# Patient Record
Sex: Female | Born: 1940 | Race: White | Hispanic: No | Marital: Married | Smoking: Former smoker
Health system: Southern US, Community
[De-identification: ages and names within clinical notes are randomized; demographics above are authoritative.]

## PROBLEM LIST (undated history)

## (undated) DIAGNOSIS — C4491 Basal cell carcinoma of skin, unspecified: Secondary | ICD-10-CM

## (undated) DIAGNOSIS — E538 Deficiency of other specified B group vitamins: Secondary | ICD-10-CM

## (undated) DIAGNOSIS — M1990 Unspecified osteoarthritis, unspecified site: Secondary | ICD-10-CM

## (undated) DIAGNOSIS — H269 Unspecified cataract: Secondary | ICD-10-CM

## (undated) DIAGNOSIS — M810 Age-related osteoporosis without current pathological fracture: Secondary | ICD-10-CM

## (undated) DIAGNOSIS — R252 Cramp and spasm: Secondary | ICD-10-CM

## (undated) DIAGNOSIS — E669 Obesity, unspecified: Secondary | ICD-10-CM

## (undated) DIAGNOSIS — M545 Low back pain, unspecified: Secondary | ICD-10-CM

## (undated) DIAGNOSIS — D649 Anemia, unspecified: Secondary | ICD-10-CM

## (undated) DIAGNOSIS — Z79899 Other long term (current) drug therapy: Secondary | ICD-10-CM

## (undated) DIAGNOSIS — R600 Localized edema: Secondary | ICD-10-CM

## (undated) DIAGNOSIS — I10 Essential (primary) hypertension: Secondary | ICD-10-CM

## (undated) DIAGNOSIS — G2581 Restless legs syndrome: Secondary | ICD-10-CM

## (undated) HISTORY — DX: Restless legs syndrome: G25.81

## (undated) HISTORY — DX: Basal cell carcinoma of skin, unspecified: C44.91

## (undated) HISTORY — DX: Low back pain, unspecified: M54.50

## (undated) HISTORY — PX: TONSILLECTOMY: SUR1361

## (undated) HISTORY — DX: Other long term (current) drug therapy: Z79.899

## (undated) HISTORY — DX: Low back pain: M54.5

## (undated) HISTORY — DX: Unspecified osteoarthritis, unspecified site: M19.90

## (undated) HISTORY — DX: Cramp and spasm: R25.2

## (undated) HISTORY — DX: Localized edema: R60.0

## (undated) HISTORY — DX: Obesity, unspecified: E66.9

## (undated) HISTORY — DX: Essential (primary) hypertension: I10

## (undated) HISTORY — PX: UPPER GASTROINTESTINAL ENDOSCOPY: SHX188

## (undated) HISTORY — PX: VITRECTOMY: SHX106

## (undated) HISTORY — DX: Anemia, unspecified: D64.9

## (undated) HISTORY — DX: Deficiency of other specified B group vitamins: E53.8

## (undated) HISTORY — DX: Unspecified cataract: H26.9

## (undated) HISTORY — PX: COLONOSCOPY: SHX174

## (undated) HISTORY — DX: Age-related osteoporosis without current pathological fracture: M81.0

---

## 1979-03-29 HISTORY — PX: STOMACH SURGERY: SHX791

## 2000-07-27 ENCOUNTER — Other Ambulatory Visit: Admission: RE | Admit: 2000-07-27 | Discharge: 2000-07-27 | Payer: Self-pay | Admitting: *Deleted

## 2001-09-12 ENCOUNTER — Other Ambulatory Visit: Admission: RE | Admit: 2001-09-12 | Discharge: 2001-09-12 | Payer: Self-pay | Admitting: *Deleted

## 2003-03-29 HISTORY — PX: SPINE SURGERY: SHX786

## 2003-05-01 ENCOUNTER — Encounter: Admission: RE | Admit: 2003-05-01 | Discharge: 2003-05-01 | Payer: Self-pay | Admitting: Family Medicine

## 2003-05-31 ENCOUNTER — Encounter: Admission: RE | Admit: 2003-05-31 | Discharge: 2003-05-31 | Payer: Self-pay | Admitting: Family Medicine

## 2003-08-12 ENCOUNTER — Encounter: Admission: RE | Admit: 2003-08-12 | Discharge: 2003-08-12 | Payer: Self-pay | Admitting: Family Medicine

## 2003-09-03 ENCOUNTER — Encounter: Admission: RE | Admit: 2003-09-03 | Discharge: 2003-09-03 | Payer: Self-pay | Admitting: Neurosurgery

## 2003-09-08 ENCOUNTER — Ambulatory Visit (HOSPITAL_COMMUNITY): Admission: RE | Admit: 2003-09-08 | Discharge: 2003-09-08 | Payer: Self-pay | Admitting: Gastroenterology

## 2003-10-01 ENCOUNTER — Encounter: Admission: RE | Admit: 2003-10-01 | Discharge: 2003-10-01 | Payer: Self-pay | Admitting: Neurosurgery

## 2004-02-11 ENCOUNTER — Inpatient Hospital Stay (HOSPITAL_COMMUNITY): Admission: RE | Admit: 2004-02-11 | Discharge: 2004-02-15 | Payer: Self-pay | Admitting: Neurosurgery

## 2004-04-07 ENCOUNTER — Encounter: Admission: RE | Admit: 2004-04-07 | Discharge: 2004-04-07 | Payer: Self-pay | Admitting: Neurosurgery

## 2004-04-16 ENCOUNTER — Encounter: Admission: RE | Admit: 2004-04-16 | Discharge: 2004-04-16 | Payer: Self-pay | Admitting: Neurosurgery

## 2004-06-09 ENCOUNTER — Encounter: Admission: RE | Admit: 2004-06-09 | Discharge: 2004-06-09 | Payer: Self-pay | Admitting: Family Medicine

## 2004-09-16 ENCOUNTER — Ambulatory Visit (HOSPITAL_COMMUNITY): Admission: RE | Admit: 2004-09-16 | Discharge: 2004-09-16 | Payer: Self-pay | Admitting: Family Medicine

## 2005-05-13 IMAGING — RF DG LUMBAR SPINE 2-3V
1 series · 4 of 4 positions shown · non-contrast
Comparison: none

CLINICAL DATA: Herniated disc

Intraoperative spine:
2 spot images from intraoperative C-arm fluoroscopy document placement of
bilateral pedicle screws at L3 and L4. Markers for graft project in interspace.

[Series 1: run · 4 of 4 slices shown]
[im 1/4]
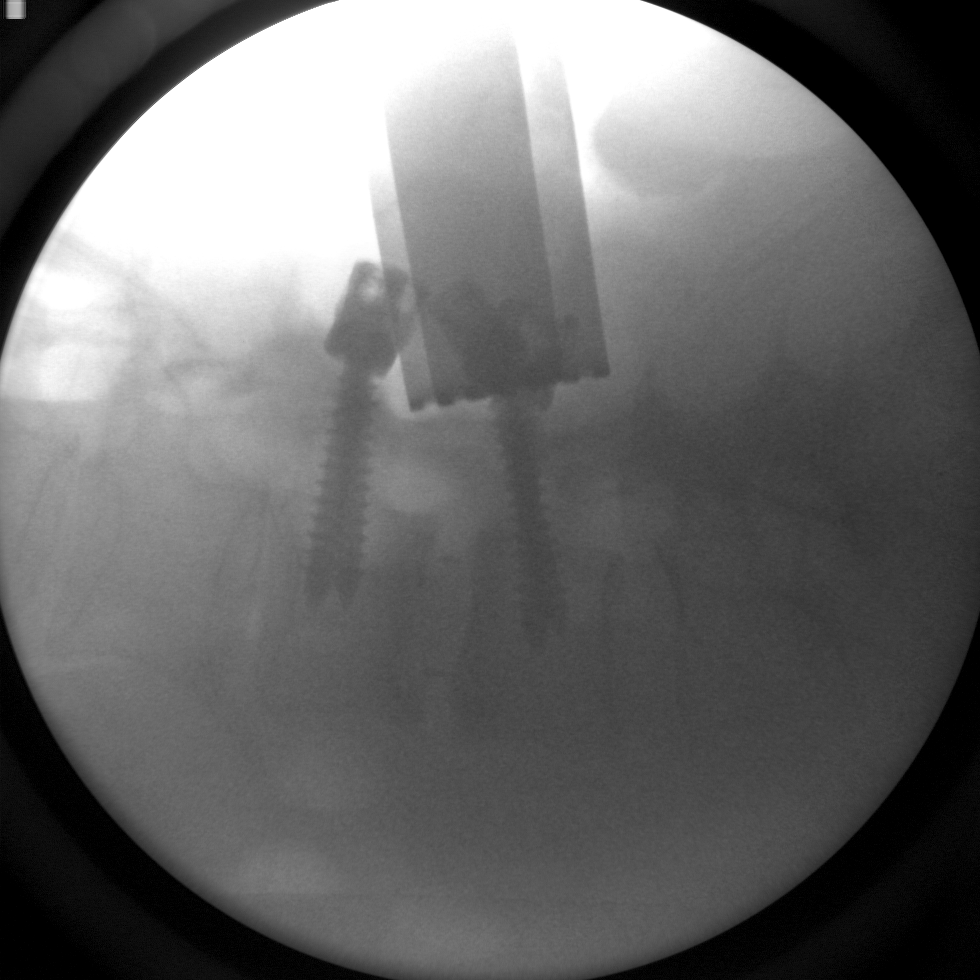
[im 2/4]
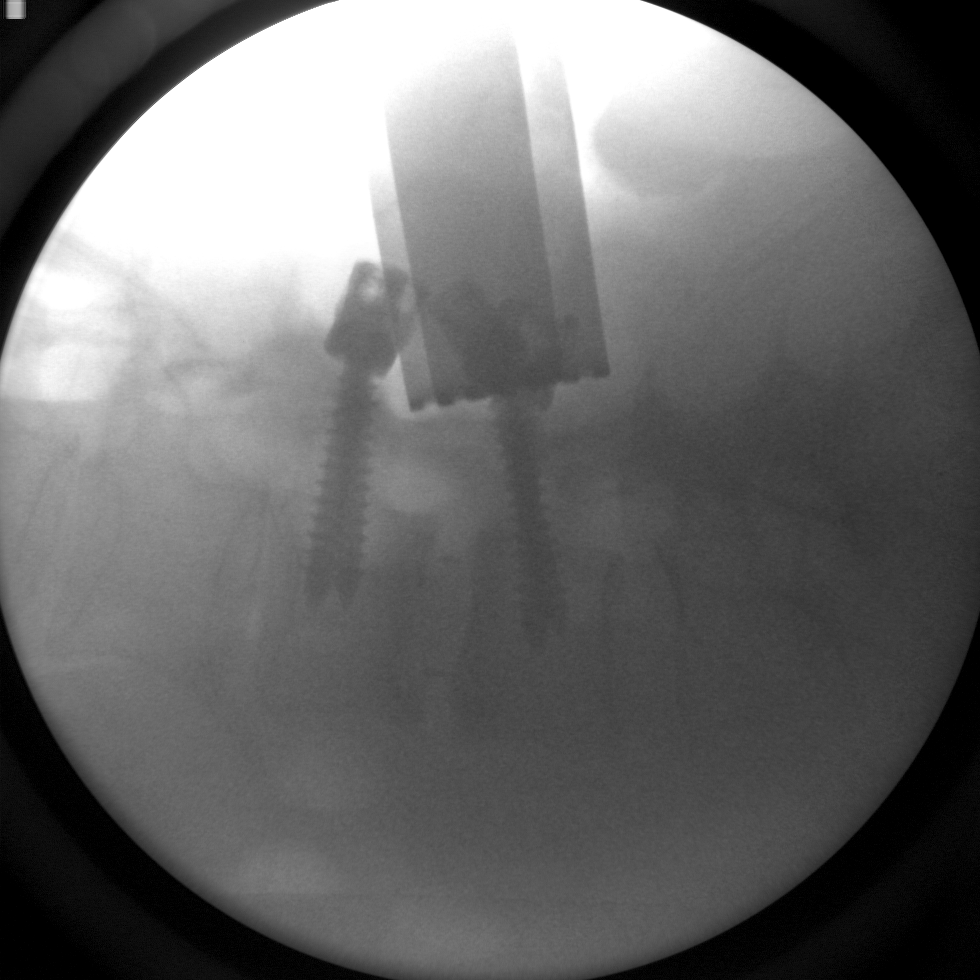
[im 3/4]
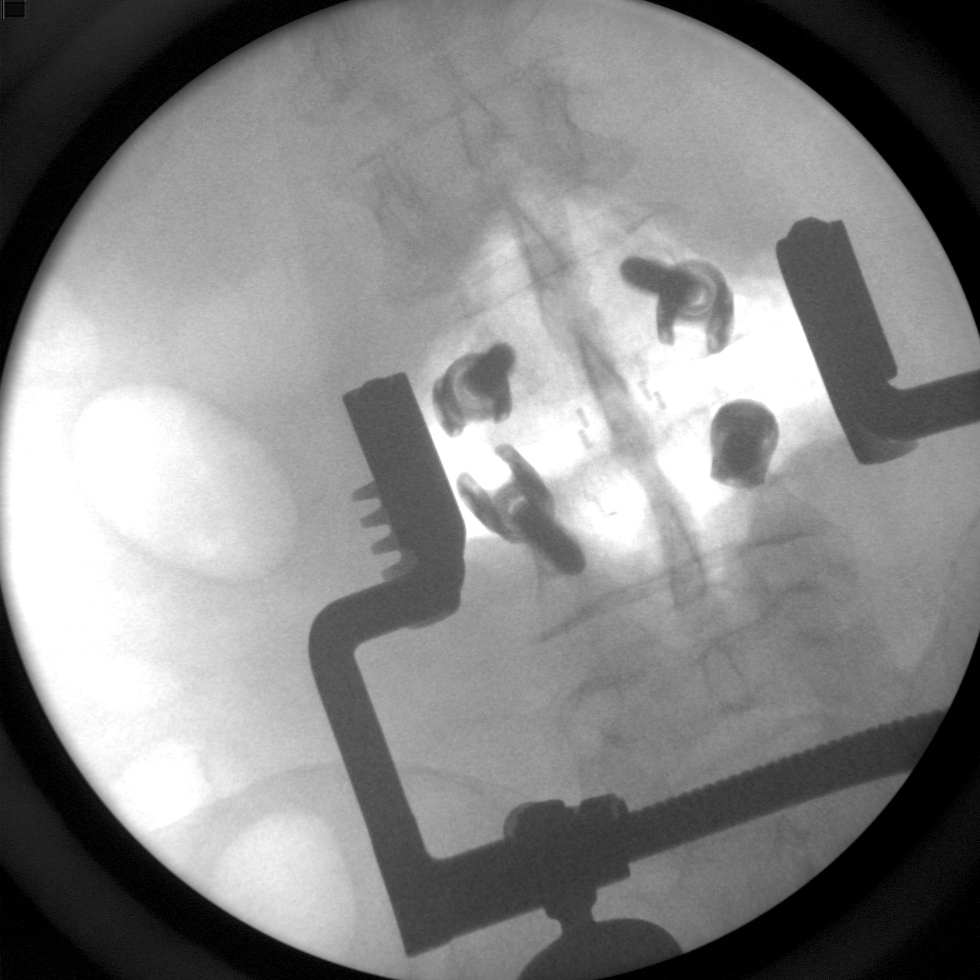
[im 4/4]
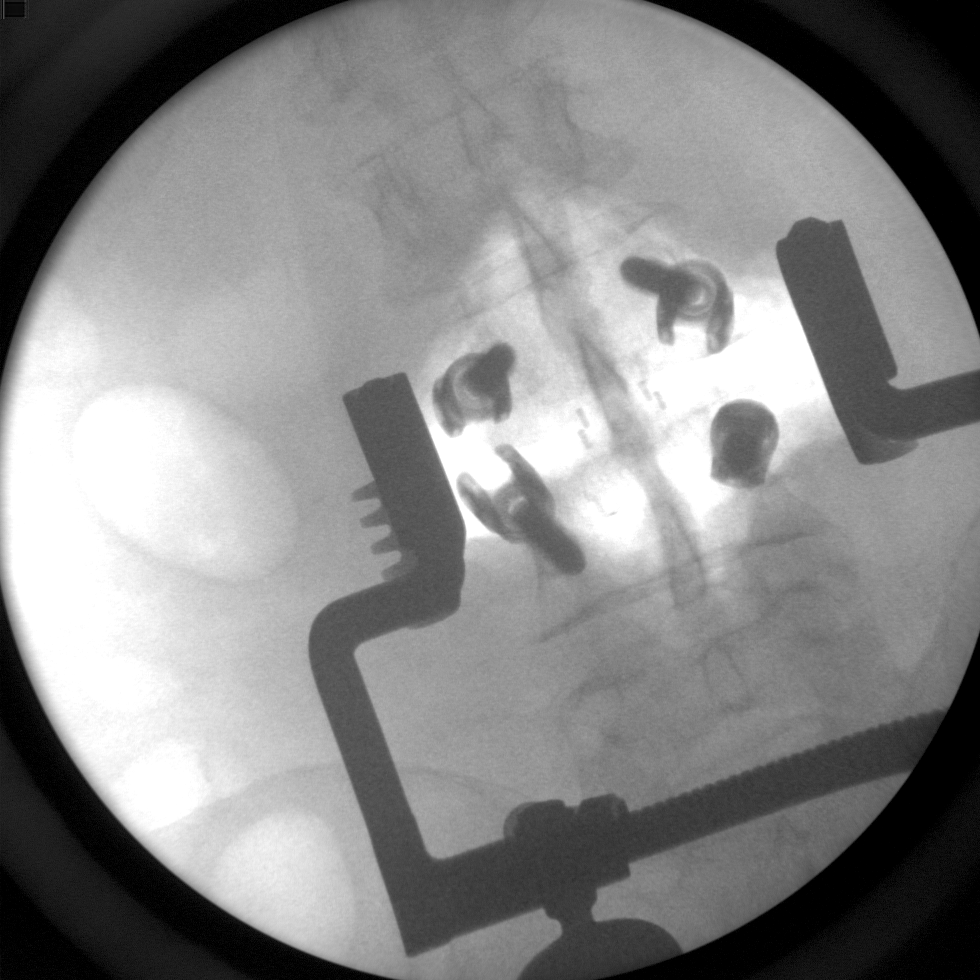

[4 of 4 positions shown; findings below may reference images not displayed]

IMPRESSION: 1. Posterolateral and interbody fusion L3-L4

## 2007-03-16 ENCOUNTER — Inpatient Hospital Stay (HOSPITAL_COMMUNITY): Admission: EM | Admit: 2007-03-16 | Discharge: 2007-03-20 | Payer: Self-pay | Admitting: Emergency Medicine

## 2010-08-10 NOTE — Discharge Summary (Signed)
Jody [name], Jody [name]                 ACCOUNT NO.:  [id number]   MEDICAL RECORD NO.:  [id number]          PATIENT TYPE:  INP   LOCATION:  3025                         FACILITY:  [facility]   PHYSICIAN:  Gabrielle Dare. Janee Morn, M.D.DATE OF BIRTH:  04/16/1940   DATE OF ADMISSION:  03/15/2007  DATE OF DISCHARGE:  03/20/2007                               DISCHARGE SUMMARY   DISCHARGE DIAGNOSES:  1. Fall.  2. Traumatic brain injury with subarachnoid hemorrhage.  3. Cervical strain.  4. Osteoarthritis.  5. Osteoporosis.  6. Acute blood loss anemia   CONSULTANTS:  Dr. Venetia Maxon for neurosurgery.   PROCEDURE:  None.   HISTORY OF PRESENT ILLNESS:  This is a 70 year old white female who fell  from her attic stairs, striking the back of her head.  There is  questionable loss of consciousness.  She came in as a non-trauma code.  Workup in the emergency department showed a subarachnoid hemorrhage  around the basilar cisterns with some mild hydrocephalus.  She  complained of headache and neck pain and was admitted for observation  and neurosurgical consultation.   HOSPITAL COURSE:  Hospital course was uneventful.  Initially, she had  quite a bit of nausea and dizziness but this improved with time and the  addition of right plan and meclizine.  She was placed in a soft collar  for cervical strain which seemed to help and was eventually able to pass  physical and occupational therapy evaluations.  A repeat CT scan showed  some improvement in the hydrocephalus but a slight increase in  subarachnoid hemorrhage.  However, she was doing so well clinically that  it was deemed appropriate that she could go home in the care of her  husband.   DISCHARGE MEDICATIONS:  1. Reglan 5 mg tablets take one p.o. four times a day, #56, no refill.  2. Meclizine 12.5 mg tablets take one p.o. four times a day p.r.n.      dizziness, #50 with no refill.  3. Darvocet-N 100 to take 1-2 p.o. every 4 hours p.r.n. pain, #50 with    no refill.   FOLLOW UP:  The patient is to follow up with her primary care Jody [name]  sometime in the next 2-3 weeks.  If she has any questions or concerns  she may call the trauma service.  Otherwise followup will be on an as-  needed basis.      Earney Hamburg, P.A.      Gabrielle Dare Janee Morn, M.D.  Electronically Signed    MJ/MEDQ  D:  03/20/2007  T:  03/20/2007  Job:  [id number]   cc:   Danae Orleans. Venetia Maxon, M.D.

## 2010-08-10 NOTE — Consult Note (Signed)
NAMEKEISHAWN, DARSEY                 ACCOUNT NO.:  [id number]   MEDICAL RECORD NO.:  [id number]          PATIENT TYPE:  EMS   LOCATION:  MAJO                         FACILITY:  [facility]   PHYSICIAN:  Danae Orleans. Venetia Maxon, M.D.  DATE OF BIRTH:  [birth year]   DATE OF CONSULTATION:  03/16/2007  DATE OF DISCHARGE:                                 CONSULTATION   REASON FOR CONSULTATION:  Subarachnoid hemorrhage.   HISTORY OF ILLNESS:  Victorine Mcnee is a 70 year old woman, who was working  up in her attic tonight and grabbed a hold of a handle which came off.  She fell backwards and struck the back of her head, had a questionable  loss of consciousness.  She was brought to [facility] Emergency  Room.  Head CT was obtained, which demonstrates a fair amount of  subarachnoid blood in the basilar cisterns with effacement of the  basilar cisterns and mild hydrocephalus.  She has significant  osteoarthritis in her neck.  She has a history of lumbar fusion by Dr.  Franky Macho, no nausea, vomiting or seizure.   PAST MEDICAL HISTORY:  Significant for osteoarthritis and osteoporosis.   MEDICATIONS INCLUDE:  Celebrex, Os-Cal, Darvocet, Forteo.   ALLERGIES:  She has no known drug allergies.   SOCIAL HISTORY:  She is retired, lives at home with her husband.   EXAMINATION:  Pulse of 81, respiratory rate is 16, blood pressure  143/80.  She has 95% saturation on room air.  She is awake, alert, oriented times three, complains of pain at the base  of her skull and at the cervicothoracic junction.  She is wearing a  cervical collar.  Pupils were equal, round and reactive to light.  Extraocular movements  are intact.  Tympanic membranes negative.  She complains of mild numbness in her right upper extremity, but has  full strength in all motor groups, bilaterally symmetric in the upper  and lower extremities.  She has a healed lumbar incision.  Reflexes are symmetric in the upper and lower extremities.   IMPRESSION:  Peyson Delao is a 70 year old woman, who fell and struck her  head and has a subarachnoid hemorrhage with mild hydrocephalus.   RECOMMENDATIONS:  Recommend that she be admitted to the neuro ICU,  observe closely with hourly neuro-checks.  We will need to repeat head  CT in the morning.  She will need to get her cervical spine cleared with  flexion/extension views.      Danae Orleans. Venetia Maxon, M.D.  Electronically Signed    JDS/MEDQ  D:  03/16/2007  T:  03/16/2007  Job:  [id number]

## 2010-08-10 NOTE — H&P (Signed)
Jody [name], Jody [name]                 ACCOUNT NO.:  [id number]   MEDICAL RECORD NO.:  [id number]          PATIENT TYPE:  EMS   LOCATION:  MAJO                         FACILITY:  [facility]   PHYSICIAN:  Gabrielle Dare. Janee Morn, M.D.DATE OF BIRTH:  08/24/1940   DATE OF ADMISSION:  03/15/2007  DATE OF DISCHARGE:                              HISTORY & PHYSICAL   CHIEF COMPLAINTS:  Headache and neck pain after fall.   HISTORY OF PRESENT ILLNESS:  Jody [name] is a 70 year old white female who  was up in the attic getting something down when she claims the attic  stair rail broke.  She fell backwards, striking the back of her head on  the attic opening and then falling onto the floor down below.  She  complains of headache and right neck pain as well as some mild right  chest pain on the chest wall.  She was evaluated in the [facility]  emergency department and workup demonstrates subarachnoid hemorrhage at  her basilar cistern on head CT with some hydrocephalus.  We are asked to  evaluate for admission.   PAST MEDICAL HISTORY:  Osteoarthritis, osteoporosis.   PAST SURGICAL HISTORY:  Lumbar spine surgery in 2006 by Dr. Coletta Memos.  She also had gastric stapling in 1981.   SOCIAL HISTORY:  She denies drug use.  Does not smoke.  Does not drink  alcohol.  She lives with her husband.  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   ALLERGIES:  No known drug allergies.   MEDICATIONS:  Are Celebrex, Os-Cal, Darvocet p.r.n. and Forteo  injections daily for her osteoporosis.   REVIEW OF SYSTEMS:  NEUROLOGICAL:  She complains of headache.  MUSCULOSKELETAL:  She has right neck pain and difficulty with movement  of her neck.  She also has some right chest wall pain.  CARDIAC:  No  angina.  PULMONARY:  No shortness of breath though her chest pain is  exacerbated some with deep breathing.  GI: Negative.  GU:  Negative.  Remainder of the review of systems is unremarkable.   PHYSICAL EXAMINATION:  VITAL SIGNS:  Pulse 81, respirations 16, blood  pressure 143/80, saturations 95% on room air.  HEENT:  She has tenderness of her scalp posterior to her right ear with  no hematoma.  EYES:  Pupils equal and reactive.  Extraocular muscles are intact.  EARS:  Are clear bilaterally with no hemotympanum.  FACE:  Face is symmetric and atraumatic.  NECK:  Has some tenderness posteriorly to the right of the midline with  no step-offs.  PULMONARY:  Lungs are clear to auscultation.  She does have some mild  tenderness to the right chest wall on palpation.  CARDIOVASCULAR:  Heart is regular.  No murmurs are heard.  Impulse is  palpable in the left chest.  ABDOMEN:  Is soft and nontender.  No organomegaly is noted.  She has  upper midline scar.  Bowel sounds are normal.  Pelvis is stable  anteriorly.  MUSCULOSKELETAL:  Extremities have no bony deformity or significant  tenderness.  BACK:  Has no tenderness and she does have an old low lumbar scar.  NEUROLOGIC:  Glasgow coma score is 15.  Strength is 5/5 in all 4  extremities.  She had some difficulty raising her head on her neck exam  as above.  No other focal deficits are noted.  Gross sensation is  intact.   LABORATORY STUDIES:  Are pending.  EKG is pending.  Chest x-ray and  right rib films are negative for acute fracture or other finding.  CT  scan of the head shows subarachnoid hemorrhage along the right basilar  cistern with some effacement and mild hydrocephalus.  It also involves  the sylvian fissure.  CT scan of the cervical spine shows degenerative  changes with no acute fracture.   IMPRESSION:  A 70 year old white female status post fall.  1. Traumatic brain injury with subarachnoid hemorrhage of the right      basilar cistern and sylvian fissure with some effacement and mild      hydrocephalus.  2. Cervical strain.  3. Osteoarthritis.   PLAN:  Will be to admit her to the neurosurgical ICU.  We will check   laboratory studies and EKG.  Neurosurgery consult was obtained and I saw  her in conjunction with Dr. Maeola Harman.  We will place her in a [place]  J collar and likely due flexion/extension cervical spine films in the  morning as well as a followup CT head.      Gabrielle Dare Janee Morn, M.D.  Electronically Signed     BET/MEDQ  D:  03/16/2007  T:  03/16/2007  Job:  [id number]   cc:   Talmadge Coventry, M.D.  Danae Orleans. Venetia Maxon, M.D.

## 2010-08-13 NOTE — Op Note (Signed)
NAMEMARISA, [name]                 ACCOUNT NO.:  [id number]   MEDICAL RECORD NO.:  [id number]          PATIENT TYPE:  INP   LOCATION:  3035                         FACILITY:  [facility]   PHYSICIAN:  Coletta Memos, M.D.     DATE OF BIRTH:  05-27-1940   DATE OF PROCEDURE:  02/11/2004  DATE OF DISCHARGE:                                 OPERATIVE REPORT   PREOPERATIVE DIAGNOSES:  1.  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  2.  Lumbar spondylosis.  3.  Lumbar displaced disk, left L3-4.   POSTOPERATIVE DIAGNOSIS:  1.  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  2.  Lumbar spondylosis.  3.  Lumbar displaced disk, left L3-4.   OPERATION PERFORMED:  1.  Posterior lumbar interbody fusion, L3-4.  2.  Interbody cage placement, L3-4, Synthes PEAK 9 mm cages packed with      morcellized autograft.  3.  Posterolateral arthrodesis, L3-4 using morcellized autograft and      allograft.  4.  Pedicle screw fixation L3 to L4, nonsegmental with Synthes Legacy 525      system.   SURGEON:  Coletta Memos, M.D.   ANESTHESIA:  General.   COMPLICATIONS:  None.   ASSISTANT:  Hewitt Shorts, M.D.   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  MRI showed a severe collapse of the disk space at L3-4 along with a  displaced disk on the left side at L3-4.  She had conservative treatment  which was unsuccessful.  I therefore recommended and she agreed to undergo  lumbar fusion at L3-4 via posterior approach and with pedicle screws.   DESCRIPTION OF PROCEDURE:  Jody [name] was brought to the operating room,  intubated and placed under a general anesthetic without difficulty.  She was  rolled prone onto body rolls and all pressure points were properly padded.  Her back was prepped and she was draped in a sterile fashion.  I infiltrated  20 mL 0.5%  lidocaine, 1:200,000 strength epinephrine into the lumbar  region  and paraspinous musculature.  I opened the skin with a #10 blade and with  that incision, took it down to the thoracolumbar fascia.  I then exposed the  lamina of what I believe to be L3 and L4.  I took a localizing film and it  showed that I was inferior to the lamina of L3 with a double ended ganglion  knife.  I then exposed the transverse processes of L3 and L4 bilaterally.  I  took another x-ray to make sure I did not confuse the level.  When I  confirmed I was at the correct level, I then proceed with  semihemilaminectomies bilaterally at L3.  I was able to identify the L3  nerve roots bilaterally and also the L4 nerve roots bilaterally.  I was able  to open the disk space and with curettage and using  specialized PLIF tools  in a Synthes set, I was able to place 2 9 mm PEAK cages into  the empty disk  space.  This was done without difficulty or harm to the nerve roots.  I then  turned my attention to placement of the pedicle screws and with Dr.  Earl Gala assistance and fluoroscopic guidance, four screws were placed,  first by placing a pedicle finder, then a tap, then a pedicle probe to  ensure the integrity of the hole and then 6.5 screws were used all around,  two in L3, two in L4.  I decorticated the transverse processes bilaterally.  Morcellized auto and allograft was placed over the transverse processes for  the posterolateral arthrodesis.  Then the screws were placed along with rods  and they were secured to the screw heads using caps.  X-rays were taken and  showed the screws to be in good position.  I then closed the wound in  layered fashion with Dr. Earl Gala help using Vicryl sutures.  Thoracolumbar fascia was reapproximated to subcutaneous tissue.  Dermabond  was used for a sterile dressing.  The patient was awakened and extubated  moving all extremities well.       KC/MEDQ  D:  02/11/2004  T:  02/11/2004  Job:  [id number]

## 2010-08-13 NOTE — Op Note (Signed)
NAME:  Jody [name], Jody [name]                           ACCOUNT NO.:  [id number]   MEDICAL RECORD NO.:  [id number]                   PATIENT TYPE:  AMB   LOCATION:  ENDO                                 FACILITY[facility]  [facility]   PHYSICIAN:  John C. Madilyn Fireman, M.D.                 DATE OF BIRTH:  16-Aug-1940   DATE OF PROCEDURE:  09/08/2003  DATE OF DISCHARGE:                                 OPERATIVE REPORT   PROCEDURE:  Colonoscopy.   INDICATION FOR PROCEDURE:  Colon cancer screening in [name] 70 year old patient  with no previous screening.   PROCEDURE:  The patient was placed in the left lateral decubitus position  and placed on the pulse monitor with continuous low-flow oxygen delivered by  nasal cannula.  She was sedated with 50 mcg IV fentanyl and 5 mg IV Versed.  The Olympus video colonoscope was inserted into the rectum and advanced to  the cecum, confirmed by transillumination at McBurney's point and  visualization of the ileocecal valve and appendiceal orifice.  Prep was  excellent.  The cecum, ascending, transverse, descending, and sigmoid colon  all appeared normal with the exception of numerous sigmoid diverticula.  There were no masses, polyps, or other abnormalities seen, and the rectum  appeared normal, and retroflexed view of the anus revealed no obvious  internal hemorrhoids.  The scope was then withdrawn and the patient returned  to the recovery room in stable condition.  She tolerated the procedure well,  and there were no immediate complications.   IMPRESSION:  Left-sided diverticulosis, otherwise normal study.   PLAN:  Next colon screening by sigmoidoscopy in five years.                                               John C. Madilyn Fireman, M.D.    JCH/MEDQ  D:  09/08/2003  T:  09/08/2003  Job:  [id number]   cc:   Talmadge Coventry, M.D.  [address]  [place]  [place] [address]  Fax: [phone]

## 2010-08-13 NOTE — Discharge Summary (Signed)
Jody [name], Jody [name] NO.:  [id number]   MEDICAL RECORD NO.:  [id number]           PATIENT TYPE:   LOCATION:                                 FACILITY:   PHYSICIAN:  Cristi Loron, M.D.    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  The MR  demonstrates severe collapse of the disk space at L3-4 along with displaced  disk on the left side at L3-4.  She had conservative treatment, which was  unsuccessful; I therefore recommended that she undergo a lumbar fusion at L3-  4 via posterior approach with pedicle screws.   For further details of this admission, please refer to the typed history and  physical.   HOSPITAL COURSE:  Dr. Franky Macho admitted the patient on February 11, 2004, on  the day of admission performed L3-4 fusion.  The surgery went well without  complications (for full details of this operation, please refer to the typed  operative note).   Postoperative course:  The patient's postoperative course was unremarkable.  By postop day #4, i.e., on February 15, 2004, the patient was afebrile,  vital signs stable.  She was eating well and ambulating well, and she was  requesting discharge to home.  She was therefore discharged home.   DISCHARGE INSTRUCTIONS:  The patient was given written discharge  instructions.  The patient is to follow up with Dr. Franky Macho.   DISCHARGE PRESCRIPTIONS:  1.  Lortab 10 mg, #50, one p.o. q.4h. p.r.n. for pain.  2.  Ambien 5 mg, #30, one p.o. q.h.s. p.r.n.   FINAL DIAGNOSES:  L4-5 degenerative disk disease, lumbar spondylosis, lumbar  displaced disk at L3-4.   PROCEDURE:  Posterior lumbar interbody fusion at L3-4; interbody cage  placement, L3-4 (Synthes PEEK 9 mm cages packed with morcellized  autograft  bone); posterolateral arthrodesis at L3-4 using morcellized autograft and  allograft; pedicle screw fixation, L3 to L4, nonsegmental, with Synthes  Legacy 5.25 system rods.      Cristi Loron, M.D.  Electronically Signed     JDJ/MEDQ  D:  04/28/2005  T:  04/29/2005  Job:  [id number]

## 2010-10-18 DIAGNOSIS — C439 Malignant melanoma of skin, unspecified: Secondary | ICD-10-CM

## 2010-10-18 HISTORY — DX: Malignant melanoma of skin, unspecified: C43.9

## 2010-12-31 LAB — BASIC METABOLIC PANEL
BUN: 18
CO2: 25
Calcium: 9.1
Calcium: 9.4
Chloride: 105
Creatinine, Ser: 0.83
GFR calc Af Amer: >60
Glucose, Bld: 160 — ABNORMAL HIGH

## 2010-12-31 LAB — CBC
Hemoglobin: 11.5 — ABNORMAL LOW
RBC: 3.71 — ABNORMAL LOW

## 2011-04-12 DIAGNOSIS — H2589 Other age-related cataract: Secondary | ICD-10-CM | POA: Diagnosis not present

## 2011-04-12 DIAGNOSIS — H40019 Open angle with borderline findings, low risk, unspecified eye: Secondary | ICD-10-CM | POA: Diagnosis not present

## 2011-05-02 DIAGNOSIS — R252 Cramp and spasm: Secondary | ICD-10-CM | POA: Diagnosis not present

## 2011-05-02 DIAGNOSIS — M2550 Pain in unspecified joint: Secondary | ICD-10-CM | POA: Diagnosis not present

## 2011-05-02 DIAGNOSIS — M1990 Unspecified osteoarthritis, unspecified site: Secondary | ICD-10-CM | POA: Diagnosis not present

## 2011-05-02 DIAGNOSIS — E538 Deficiency of other specified B group vitamins: Secondary | ICD-10-CM | POA: Diagnosis not present

## 2011-05-02 DIAGNOSIS — D649 Anemia, unspecified: Secondary | ICD-10-CM | POA: Diagnosis not present

## 2011-05-02 DIAGNOSIS — M810 Age-related osteoporosis without current pathological fracture: Secondary | ICD-10-CM | POA: Diagnosis not present

## 2011-05-30 DIAGNOSIS — M810 Age-related osteoporosis without current pathological fracture: Secondary | ICD-10-CM | POA: Diagnosis not present

## 2011-05-30 DIAGNOSIS — E538 Deficiency of other specified B group vitamins: Secondary | ICD-10-CM | POA: Diagnosis not present

## 2011-05-30 DIAGNOSIS — Z13220 Encounter for screening for lipoid disorders: Secondary | ICD-10-CM | POA: Diagnosis not present

## 2011-06-22 DIAGNOSIS — Z85828 Personal history of other malignant neoplasm of skin: Secondary | ICD-10-CM | POA: Diagnosis not present

## 2011-06-22 DIAGNOSIS — D239 Other benign neoplasm of skin, unspecified: Secondary | ICD-10-CM | POA: Diagnosis not present

## 2011-07-04 DIAGNOSIS — E538 Deficiency of other specified B group vitamins: Secondary | ICD-10-CM | POA: Diagnosis not present

## 2011-07-06 DIAGNOSIS — M779 Enthesopathy, unspecified: Secondary | ICD-10-CM | POA: Diagnosis not present

## 2011-07-06 DIAGNOSIS — M2040 Other hammer toe(s) (acquired), unspecified foot: Secondary | ICD-10-CM | POA: Diagnosis not present

## 2011-08-03 DIAGNOSIS — E538 Deficiency of other specified B group vitamins: Secondary | ICD-10-CM | POA: Diagnosis not present

## 2011-09-02 DIAGNOSIS — E538 Deficiency of other specified B group vitamins: Secondary | ICD-10-CM | POA: Diagnosis not present

## 2011-10-10 DIAGNOSIS — E538 Deficiency of other specified B group vitamins: Secondary | ICD-10-CM | POA: Diagnosis not present

## 2011-11-14 DIAGNOSIS — E538 Deficiency of other specified B group vitamins: Secondary | ICD-10-CM | POA: Diagnosis not present

## 2012-01-10 DIAGNOSIS — E538 Deficiency of other specified B group vitamins: Secondary | ICD-10-CM | POA: Diagnosis not present

## 2012-01-31 DIAGNOSIS — Z85828 Personal history of other malignant neoplasm of skin: Secondary | ICD-10-CM | POA: Diagnosis not present

## 2012-01-31 DIAGNOSIS — D239 Other benign neoplasm of skin, unspecified: Secondary | ICD-10-CM | POA: Diagnosis not present

## 2012-01-31 DIAGNOSIS — L821 Other seborrheic keratosis: Secondary | ICD-10-CM | POA: Diagnosis not present

## 2012-02-13 DIAGNOSIS — E538 Deficiency of other specified B group vitamins: Secondary | ICD-10-CM | POA: Diagnosis not present

## 2012-03-09 DIAGNOSIS — H269 Unspecified cataract: Secondary | ICD-10-CM | POA: Diagnosis not present

## 2012-03-09 DIAGNOSIS — D649 Anemia, unspecified: Secondary | ICD-10-CM | POA: Diagnosis not present

## 2012-03-09 DIAGNOSIS — M1990 Unspecified osteoarthritis, unspecified site: Secondary | ICD-10-CM | POA: Diagnosis not present

## 2012-03-15 DIAGNOSIS — E538 Deficiency of other specified B group vitamins: Secondary | ICD-10-CM | POA: Diagnosis not present

## 2012-04-12 DIAGNOSIS — H2589 Other age-related cataract: Secondary | ICD-10-CM | POA: Diagnosis not present

## 2012-04-12 DIAGNOSIS — H40009 Preglaucoma, unspecified, unspecified eye: Secondary | ICD-10-CM | POA: Diagnosis not present

## 2012-04-17 DIAGNOSIS — E538 Deficiency of other specified B group vitamins: Secondary | ICD-10-CM | POA: Diagnosis not present

## 2012-05-24 DIAGNOSIS — E538 Deficiency of other specified B group vitamins: Secondary | ICD-10-CM | POA: Diagnosis not present

## 2012-05-28 DIAGNOSIS — M810 Age-related osteoporosis without current pathological fracture: Secondary | ICD-10-CM | POA: Diagnosis not present

## 2012-05-28 DIAGNOSIS — D649 Anemia, unspecified: Secondary | ICD-10-CM | POA: Diagnosis not present

## 2012-05-28 DIAGNOSIS — M2550 Pain in unspecified joint: Secondary | ICD-10-CM | POA: Diagnosis not present

## 2012-05-28 DIAGNOSIS — H269 Unspecified cataract: Secondary | ICD-10-CM | POA: Diagnosis not present

## 2012-05-28 DIAGNOSIS — Z0000 Encounter for general adult medical examination without abnormal findings: Secondary | ICD-10-CM | POA: Diagnosis not present

## 2012-06-06 DIAGNOSIS — M810 Age-related osteoporosis without current pathological fracture: Secondary | ICD-10-CM | POA: Diagnosis not present

## 2012-06-06 DIAGNOSIS — Z1231 Encounter for screening mammogram for malignant neoplasm of breast: Secondary | ICD-10-CM | POA: Diagnosis not present

## 2012-06-22 ENCOUNTER — Ambulatory Visit (INDEPENDENT_AMBULATORY_CARE_PROVIDER_SITE_OTHER): Payer: Medicare Other | Admitting: *Deleted

## 2012-06-22 ENCOUNTER — Other Ambulatory Visit: Payer: Self-pay

## 2012-06-22 DIAGNOSIS — D649 Anemia, unspecified: Secondary | ICD-10-CM

## 2012-06-22 DIAGNOSIS — K649 Unspecified hemorrhoids: Secondary | ICD-10-CM | POA: Diagnosis not present

## 2012-06-22 DIAGNOSIS — E538 Deficiency of other specified B group vitamins: Secondary | ICD-10-CM | POA: Diagnosis not present

## 2012-06-22 DIAGNOSIS — M810 Age-related osteoporosis without current pathological fracture: Secondary | ICD-10-CM | POA: Diagnosis not present

## 2012-06-22 DIAGNOSIS — E785 Hyperlipidemia, unspecified: Secondary | ICD-10-CM | POA: Diagnosis not present

## 2012-06-22 MED ORDER — CYANOCOBALAMIN 1000 MCG/ML IJ SOLN
1000.0000 ug | Freq: Once | INTRAMUSCULAR | Status: AC
  Administered 2012-06-22: 1000 ug via INTRAMUSCULAR

## 2012-06-23 LAB — COMPREHENSIVE METABOLIC PANEL
ALT: 22 IU/L (ref 0–32)
AST: 33 IU/L (ref 0–40)
Albumin/Globulin Ratio: 1.6 (ref 1.1–2.5)
Albumin: 4.3 g/dL (ref 3.5–4.8)
Alkaline Phosphatase: 57 IU/L (ref 39–117)
BUN/Creatinine Ratio: 19 (ref 11–26)
BUN: 15 mg/dL (ref 8–27)
CO2: 26 mmol/L (ref 19–28)
Calcium: 9.3 mg/dL (ref 8.6–10.2)
Chloride: 103 mmol/L (ref 97–108)
Creatinine, Ser: 0.81 mg/dL (ref 0.57–1.00)
GFR calc Af Amer: 83 mL/min/{1.73_m2} (ref >59)
GFR calc non Af Amer: 72 mL/min/{1.73_m2} (ref >59)
Globulin, Total: 2.7 g/dL (ref 1.5–4.5)
Glucose: 87 mg/dL (ref 65–99)
Potassium: 4.1 mmol/L (ref 3.5–5.2)
Sodium: 142 mmol/L (ref 134–144)
Total Bilirubin: 0.4 mg/dL (ref 0.0–1.2)
Total Protein: 7 g/dL (ref 6.0–8.5)

## 2012-06-23 LAB — LIPID PANEL
Chol/HDL Ratio: 3 ratio units (ref 0.0–4.4)
Cholesterol, Total: 158 mg/dL (ref 100–199)
HDL: 53 mg/dL (ref >39)
LDL Calculated: 86 mg/dL (ref 0–99)
Triglycerides: 97 mg/dL (ref 0–149)
VLDL Cholesterol Cal: 19 mg/dL (ref 5–40)

## 2012-07-10 DIAGNOSIS — M25569 Pain in unspecified knee: Secondary | ICD-10-CM | POA: Diagnosis not present

## 2012-07-11 ENCOUNTER — Encounter: Payer: Self-pay | Admitting: Internal Medicine

## 2012-07-11 ENCOUNTER — Other Ambulatory Visit (HOSPITAL_COMMUNITY): Payer: Self-pay | Admitting: Family Medicine

## 2012-07-11 DIAGNOSIS — R609 Edema, unspecified: Secondary | ICD-10-CM

## 2012-07-11 DIAGNOSIS — I509 Heart failure, unspecified: Secondary | ICD-10-CM

## 2012-07-12 ENCOUNTER — Encounter: Payer: Self-pay | Admitting: Geriatric Medicine

## 2012-07-17 ENCOUNTER — Ambulatory Visit (HOSPITAL_COMMUNITY)
Admission: RE | Admit: 2012-07-17 | Discharge: 2012-07-17 | Disposition: A | Discharge status: Home / Self Care | Payer: Medicare Other | Source: Ambulatory Visit | Attending: Family Medicine | Admitting: Family Medicine

## 2012-07-17 DIAGNOSIS — R609 Edema, unspecified: Secondary | ICD-10-CM | POA: Diagnosis not present

## 2012-07-17 DIAGNOSIS — I509 Heart failure, unspecified: Secondary | ICD-10-CM

## 2012-07-17 NOTE — Progress Notes (Signed)
  Echocardiogram 2D Echocardiogram has been performed.  Jody [name] 07/17/2012, 5:00 PM

## 2012-07-18 ENCOUNTER — Other Ambulatory Visit (HOSPITAL_COMMUNITY): Payer: Self-pay

## 2012-07-25 ENCOUNTER — Encounter: Payer: Self-pay | Admitting: Internal Medicine

## 2012-07-30 ENCOUNTER — Ambulatory Visit (INDEPENDENT_AMBULATORY_CARE_PROVIDER_SITE_OTHER): Payer: Medicare Other | Admitting: *Deleted

## 2012-07-30 DIAGNOSIS — E538 Deficiency of other specified B group vitamins: Secondary | ICD-10-CM | POA: Diagnosis not present

## 2012-07-30 MED ORDER — CYANOCOBALAMIN 1000 MCG/ML IJ SOLN
1000.0000 ug | Freq: Once | INTRAMUSCULAR | Status: AC
  Administered 2012-07-30: 1000 ug via INTRAMUSCULAR

## 2012-08-23 ENCOUNTER — Encounter: Payer: Self-pay | Admitting: *Deleted

## 2012-08-24 ENCOUNTER — Other Ambulatory Visit: Payer: Self-pay | Admitting: Internal Medicine

## 2012-08-24 ENCOUNTER — Encounter: Payer: Self-pay | Admitting: Internal Medicine

## 2012-08-24 ENCOUNTER — Ambulatory Visit (INDEPENDENT_AMBULATORY_CARE_PROVIDER_SITE_OTHER): Payer: Medicare Other | Admitting: Internal Medicine

## 2012-08-24 VITALS — BP 130/100 | HR 79 | Temp 97.1°F | Resp 14 | Ht 61.0 in | Wt 166.0 lb

## 2012-08-24 DIAGNOSIS — R609 Edema, unspecified: Secondary | ICD-10-CM | POA: Diagnosis not present

## 2012-08-24 DIAGNOSIS — I10 Essential (primary) hypertension: Secondary | ICD-10-CM

## 2012-08-24 DIAGNOSIS — E538 Deficiency of other specified B group vitamins: Secondary | ICD-10-CM

## 2012-08-24 DIAGNOSIS — R600 Localized edema: Secondary | ICD-10-CM

## 2012-08-24 DIAGNOSIS — M810 Age-related osteoporosis without current pathological fracture: Secondary | ICD-10-CM

## 2012-08-24 MED ORDER — BLOOD PRESSURE MONITOR/M CUFF MISC: 1.0000 | Freq: Every day | Status: DC

## 2012-08-24 NOTE — Progress Notes (Signed)
Patient ID: Jody [name], female   DOB: 13-Dec-1940, 72 y.o.   MRN: [id number]  Allergies  Allergen Reactions  . Shrimp (Shellfish Allergy)     All seafood    Chief Complaint  Patient presents with  . Leg Swelling    HPI: Patient is a 72 y.o. white female seen in the office today for leg swelling.   Legs are swelling, but been obvious for past couple of mos.  Does sometimes wear compression hose and doesn't want to in the heat.  Is going to PT for other issues, and noticed that are worse.  Is short of breath going up steps, but says she is deconditioned.  Having a hard time getting back on track this time.  Has knee pain.  Hasn't been to her deep water aerobics--hurts when she walks.  Knows her weight is an issue, also.  Says she is in worst shape in 15 years.  No orthopnea, PND.  Edema present daily.    Has continued to work [employer]) and retiring in 2 more weeks for the third time (52 years).    Review of Systems: Review of Systems  Constitutional: Positive for malaise/fatigue. Negative for fever, chills and weight loss.  HENT: Negative for hearing loss.   Eyes: Negative for blurred vision.  Respiratory: Positive for shortness of breath. Negative for cough and sputum production.        Sob with significant exertion--she attributes this to deconditioning  Cardiovascular: Positive for leg swelling. Negative for chest pain, palpitations, orthopnea, claudication and PND.  Gastrointestinal: Negative for abdominal pain.  Genitourinary: Negative for frequency.  Musculoskeletal: Negative for falls.  Skin: Negative for rash.  Neurological: Negative for dizziness and weakness.  Endo/Heme/Allergies: Does not bruise/bleed easily.  Psychiatric/Behavioral: Negative for depression and memory loss. The patient is not nervous/anxious.      Past Medical History  Diagnosis Date  . Encounter for long-term (current) use of other medications   . B12 deficiency due to diet   . Anemia, unspecified    . Restless legs syndrome (RLS)   . Osteoarthrosis, unspecified whether generalized or localized, unspecified site   . Lumbago   . Cramp of limb   . Senile osteoporosis   . Edema of both legs    Past Surgical History  Procedure Laterality Date  . Stomach surgery  1981  . Spine surgery  2005   Social History:   reports that she has quit smoking. She does not have any smokeless tobacco history on file. She reports that she does not drink alcohol or use illicit drugs.  Family History  Problem Relation Age of Onset  . Cancer Sister   . Cancer Brother     Medications: Patient's Medications  New Prescriptions   BLOOD PRESSURE MONITORING (BLOOD PRESSURE MONITOR/M CUFF) MISC    1 Package by Does not apply route daily.  Previous Medications   CALCIUM CARB-CHOLECALCIFEROL (CALCIUM + D3) 600-200 MG-UNIT TABS    Take one tablet three times daily   CELECOXIB (CELEBREX) 100 MG CAPSULE    Take one tablet twice daily for pains   CHOLECALCIFEROL (VITAMIN D) 2000 UNITS TABLET    Take 2,000 Units by mouth daily.   CYANOCOBALAMIN 1000 MCG/ML KIT    Inject as directed. Inject IM once monthly for vitamin b12   DENOSUMAB (PROLIA) 60 MG/ML SOLN INJECTION    Inject 60 mg into the skin every 6 (six) months. Administer in upper arm, thigh, or abdomen   DIPHENHYDRAMINE-ACETAMINOPHEN (TYLENOL PM)  25-500 MG TABS    Take 1 tablet by mouth at bedtime as needed.   IRON 66 MG TABS    Take one tablet once daily   MULTIPLE VITAMIN (MULTIVITAMIN) TABLET    Take 1 tablet by mouth daily.  Modified Medications   No medications on file  Discontinued Medications   No medications on file     Physical Exam:  Filed Vitals:   08/24/12 0833  BP: 130/100  Pulse: 79  Temp: 97.1 F (36.2 C)  TempSrc: Oral  Resp: 14  Height: 5\' 1"  (1.549 m)  Weight: 166 lb (75.297 kg)  SpO2: 97%   Physical Exam  Constitutional: She is oriented to person, place, and time.  Obese Caucasian female, NAD  HENT:  Head:  Normocephalic and atraumatic.  Neck: No JVD present.  Cardiovascular: Normal rate, regular rhythm and intact distal pulses.   Pulmonary/Chest: Effort normal and breath sounds normal. No respiratory distress. She has no rales.  Abdominal: Soft. Bowel sounds are normal.  Musculoskeletal: She exhibits edema.  Nonpitting edema of b/l LE which she says is new in the past few months  Neurological: She is alert and oriented to person, place, and time.  Skin: Skin is warm and dry.  Psychiatric: She has a normal mood and affect.      Labs reviewed: Basic Metabolic Panel:  Recent Labs  40/98/11 0849  NA 142  K 4.1  CL 103  CO2 26  GLUCOSE 87  BUN 15  CREATININE 0.81  CALCIUM 9.3   Liver Function Tests:  Recent Labs  06/22/12 0849  AST 33  ALT 22  ALKPHOS 57  BILITOT 0.4  PROT 7.0  Lipid Panel:  Recent Labs  06/22/12 0849  HDL 53  LDLCALC 86  TRIG 97  CHOLHDL 3.0    Past Procedures: Echocardiogram was reviewed with grade 1 diastolic dysfunction, normal systolic function with EF 55-65%, no valvular disease significant enough to explain edema  Assessment/Plan Edema of both legs Seems to be exclusively dependent edema as far as I can tell.  Her kidneys, liver and heart (systolic function, valves) appear to function normally.  She has normal pedal pulses suggesting satisfactory arterial flow.  She has some mild varicosities but not severe so there may be a small role to venous insuffiiciency, but she has no skin changes from this.  Her protein levels are also normal despite a "lousy diet".  We discussed a low sodium diet and one was provided for her.  She was also advised to elevate her feet as much as possible when seated.  She does not want to use compression hose when it's hot out which is the other option.  Should the edema worsen or she develops pain or color changes, doppler studies can be performed of her legs.  Senile osteoporosis She will return next week for her  prolia injection.  Continues on ca with D and additional D supplement.  The system does not allow me to recheck her levels of vitamin D even though I haven't checked them this year.  B12 deficiency due to diet I believe we managed to check her b12 level with a manual order b/c she is taking the supplemental injection next week if her levels are low.  The system prohibits me from checking it with an electronic order.   Labs/tests ordered:  b12 level Keep routine appt

## 2012-08-24 NOTE — Patient Instructions (Signed)
Please check your blood pressure daily at home about an hour after you get up.  Write them down.   Let me know if they are staying over 150/90 more than 3 times. Also, please try to watch your sodium intake: 2 Gram Low Sodium Diet A 2 gram sodium diet restricts the amount of sodium in the diet to no more than 2 g or 2000 mg daily. Limiting the amount of sodium is often used to help lower blood pressure. It is important if you have heart, liver, or kidney problems. Many foods contain sodium for flavor and sometimes as a preservative. When the amount of sodium in a diet needs to be low, it is important to know what to look for when choosing foods and drinks. The following includes some information and guidelines to help make it easier for you to adapt to a low sodium diet. QUICK TIPS  Do not add salt to food.  Avoid convenience items and fast food.  Choose unsalted snack foods.  Buy lower sodium products, often labeled as "lower sodium" or "no salt added."  Check food labels to learn how much sodium is in 1 serving.  When eating at a restaurant, ask that your food be prepared with less salt or none, if possible. READING FOOD LABELS FOR SODIUM INFORMATION The nutrition facts label is a good place to find how much sodium is in foods. Look for products with no more than 500 to 600 mg of sodium per meal and no more than 150 mg per serving. Remember that 2 g = 2000 mg. The food label may also list foods as:  Sodium-free: Less than 5 mg in a serving.  Very low sodium: 35 mg or less in a serving.  Low-sodium: 140 mg or less in a serving.  Light in sodium: 50% less sodium in a serving. For example, if a food that usually has 300 mg of sodium is changed to become light in sodium, it will have 150 mg of sodium.  Reduced sodium: 25% less sodium in a serving. For example, if a food that usually has 400 mg of sodium is changed to reduced sodium, it will have 300 mg of sodium. CHOOSING  FOODS Grains  Avoid: Salted crackers and snack items. Some cereals, including instant hot cereals. Bread stuffing and biscuit mixes. Seasoned rice or pasta mixes.  Choose: Unsalted snack items. Low-sodium cereals, oats, puffed wheat and rice, shredded wheat. English muffins and bread. Pasta. Meats  Avoid: Salted, canned, smoked, spiced, pickled meats, including fish and poultry. Bacon, ham, sausage, cold cuts, hot dogs, anchovies.  Choose: Low-sodium canned tuna and salmon. Fresh or frozen meat, poultry, and fish. Dairy  Avoid: Processed cheese and spreads. Cottage cheese. Buttermilk and condensed milk. Regular cheese.  Choose: Milk. Low-sodium cottage cheese. Yogurt. Sour cream. Low-sodium cheese. Fruits and Vegetables  Avoid: Regular canned vegetables. Regular canned tomato sauce and paste. Frozen vegetables in sauces. Olives. Rosita Fire. Relishes. Sauerkraut.  Choose: Low-sodium canned vegetables. Low-sodium tomato sauce and paste. Frozen or fresh vegetables. Fresh and frozen fruit. Condiments  Avoid: Canned and packaged gravies. Worcestershire sauce. Tartar sauce. Barbecue sauce. Soy sauce. Steak sauce. Ketchup. Onion, garlic, and table salt. Meat flavorings and tenderizers.  Choose: Fresh and dried herbs and spices. Low-sodium varieties of mustard and ketchup. Lemon juice. Tabasco sauce. Horseradish. SAMPLE 2 GRAM SODIUM MEAL PLAN Breakfast / Sodium (mg)  1 cup low-fat milk / 143 mg  2 slices whole-wheat toast / 270 mg  1 tbs heart-healthy  margarine / 153 mg  1 hard-boiled egg / 139 mg  1 small orange / 0 mg Lunch / Sodium (mg)  1 cup raw carrots / 76 mg   cup hummus / 298 mg  1 cup low-fat milk / 143 mg   cup red grapes / 2 mg  1 whole-wheat pita bread / 356 mg Dinner / Sodium (mg)  1 cup whole-wheat pasta / 2 mg  1 cup low-sodium tomato sauce / 73 mg  3 oz lean ground beef / 57 mg  1 small side salad (1 cup raw spinach leaves,  cup cucumber,  cup  yellow bell pepper) with 1 tsp olive oil and 1 tsp red wine vinegar / 25 mg Snack / Sodium (mg)  1 container low-fat vanilla yogurt / 107 mg  3 graham cracker squares / 127 mg Nutrient Analysis  Calories: 2033  Protein: 77 g  Carbohydrate: 282 g  Fat: 72 g  Sodium: 1971 mg Document Released: 03/14/2005 Document Revised: 06/06/2011 Document Reviewed: 06/15/2009 [employer] Patient Information 2014 [place], [place].

## 2012-08-25 ENCOUNTER — Encounter: Payer: Self-pay | Admitting: Internal Medicine

## 2012-08-25 DIAGNOSIS — E538 Deficiency of other specified B group vitamins: Secondary | ICD-10-CM | POA: Insufficient documentation

## 2012-08-25 DIAGNOSIS — M810 Age-related osteoporosis without current pathological fracture: Secondary | ICD-10-CM | POA: Insufficient documentation

## 2012-08-25 DIAGNOSIS — R600 Localized edema: Secondary | ICD-10-CM | POA: Insufficient documentation

## 2012-08-25 NOTE — Assessment & Plan Note (Addendum)
She will return next week for her prolia injection.  Continues on ca with D and additional D supplement.  The system does not allow me to recheck her levels of vitamin D even though I haven't checked them this year.

## 2012-08-25 NOTE — Assessment & Plan Note (Signed)
Seems to be exclusively dependent edema as far as I can tell.  Her kidneys, liver and heart (systolic function, valves) appear to function normally.  She has normal pedal pulses suggesting satisfactory arterial flow.  She has some mild varicosities but not severe so there may be a small role to venous insuffiiciency, but she has no skin changes from this.  Her protein levels are also normal despite a "lousy diet".  We discussed a low sodium diet and one was provided for her.  She was also advised to elevate her feet as much as possible when seated.  She does not want to use compression hose when it's hot out which is the other option.  Should the edema worsen or she develops pain or color changes, doppler studies can be performed of her legs.

## 2012-08-25 NOTE — Assessment & Plan Note (Signed)
I believe we managed to check her b12 level with a manual order b/c she is taking the supplemental injection next week if her levels are low.  The system prohibits me from checking it with an electronic order.

## 2012-08-29 LAB — VITAMIN B12: Vitamin B-12: 386 pg/mL (ref 211–946)

## 2012-08-30 ENCOUNTER — Ambulatory Visit (INDEPENDENT_AMBULATORY_CARE_PROVIDER_SITE_OTHER): Payer: Medicare Other

## 2012-08-30 VITALS — BP 130/90

## 2012-08-30 DIAGNOSIS — M810 Age-related osteoporosis without current pathological fracture: Secondary | ICD-10-CM

## 2012-08-30 DIAGNOSIS — E559 Vitamin D deficiency, unspecified: Secondary | ICD-10-CM

## 2012-08-30 DIAGNOSIS — E538 Deficiency of other specified B group vitamins: Secondary | ICD-10-CM

## 2012-08-30 DIAGNOSIS — I10 Essential (primary) hypertension: Secondary | ICD-10-CM

## 2012-08-30 MED ORDER — CYANOCOBALAMIN 1000 MCG/ML IJ SOLN
1000.0000 ug | Freq: Once | INTRAMUSCULAR | Status: AC
  Administered 2012-08-30: 1000 ug via INTRAMUSCULAR

## 2012-08-30 MED ORDER — DENOSUMAB 60 MG/ML ~~LOC~~ SOLN
60.0000 mg | Freq: Once | SUBCUTANEOUS | Status: AC
  Administered 2012-08-30: 60 mg via SUBCUTANEOUS

## 2012-09-03 ENCOUNTER — Encounter: Payer: Self-pay | Admitting: Internal Medicine

## 2012-09-03 ENCOUNTER — Ambulatory Visit (INDEPENDENT_AMBULATORY_CARE_PROVIDER_SITE_OTHER): Payer: Medicare Other | Admitting: Internal Medicine

## 2012-09-03 VITALS — BP 164/100 | HR 69 | Temp 98.1°F | Resp 14 | Ht 61.0 in | Wt 165.2 lb

## 2012-09-03 DIAGNOSIS — I10 Essential (primary) hypertension: Secondary | ICD-10-CM | POA: Diagnosis not present

## 2012-09-03 MED ORDER — LISINOPRIL 5 MG PO TABS: 5.0000 mg | ORAL_TABLET | Freq: Every day | ORAL | Status: DC

## 2012-09-03 NOTE — Assessment & Plan Note (Addendum)
Is now worried about BP.  Denies headaches.  Does get dizziness sometimes.  No chest pains, breathing trouble or difficulty urinating.  Had bp meter calibrated here and where she bought it and it still reads high.  BP read high here, as well.  BP has been 150s-170s systolic over 70s to 90s.  HR 70s to 90s.  Never adds salt and is trying to be more proactive with avoiding deli meat and reading packages for sodium.  Is hard with what she eats b/c she eats a lot of processed foods.  Can really only eat chicken.  Has seafood allergy.  Teeth are not good so can't eat hard things.  Will begin lisinopril 5mg  po daily and f/u in 1 month for bp check and bmp.  Also, she will continue to check her bp at home and watch her sodium intake more.

## 2012-09-03 NOTE — Progress Notes (Signed)
Patient ID: Jody [name], female   DOB: 1940/09/06, 72 y.o.   MRN: [id number]  Allergies  Allergen Reactions  . Shrimp (Shellfish Allergy)     All seafood    Chief Complaint  Patient presents with  . Hypertension  . tick bites on upper legs    HPI: Patient is a 71 y.o. white female seen in the office today for AV re: blood pressure.  Review of Systems:  Review of Systems  Constitutional: Negative for fever and malaise/fatigue.  Eyes: Negative for blurred vision.  Respiratory: Negative for shortness of breath.   Cardiovascular: Negative for chest pain.  Gastrointestinal: Negative for abdominal pain.  Genitourinary: Negative for dysuria.  Musculoskeletal: Negative for falls.  Skin: Positive for itching and rash.  Neurological: Negative for dizziness, weakness and headaches.  Endo/Heme/Allergies: Does not bruise/bleed easily.  Psychiatric/Behavioral: Negative for memory loss.     Past Medical History  Diagnosis Date  . Encounter for long-term (current) use of other medications   . B12 deficiency due to diet   . Anemia, unspecified   . Restless legs syndrome (RLS)   . Osteoarthrosis, unspecified whether generalized or localized, unspecified site   . Lumbago   . Cramp of limb   . Senile osteoporosis   . Edema of both legs    Past Surgical History  Procedure Laterality Date  . Stomach surgery  1981  . Spine surgery  2005   Social History:   reports that she has quit smoking. She does not have any smokeless tobacco history on file. She reports that she does not drink alcohol or use illicit drugs.  Family History  Problem Relation Age of Onset  . Cancer Sister   . Cancer Brother     Medications: Patient's Medications  New Prescriptions   LISINOPRIL (PRINIVIL,ZESTRIL) 5 MG TABLET    Take 1 tablet (5 mg total) by mouth daily.  Previous Medications   BLOOD PRESSURE MONITORING (BLOOD PRESSURE MONITOR/M CUFF) MISC    1 Package by Does not apply route daily.   CALCIUM  CARB-CHOLECALCIFEROL (CALCIUM + D3) 600-200 MG-UNIT TABS    Take one tablet three times daily   CELECOXIB (CELEBREX) 100 MG CAPSULE    Take one tablet twice daily for pains   CHOLECALCIFEROL (VITAMIN D) 2000 UNITS TABLET    Take 2,000 Units by mouth daily.   CYANOCOBALAMIN 1000 MCG/ML KIT    Inject as directed. Inject IM once monthly for vitamin b12   DENOSUMAB (PROLIA) 60 MG/ML SOLN INJECTION    Inject 60 mg into the skin every 6 (six) months. Administer in upper arm, thigh, or abdomen   DIPHENHYDRAMINE-ACETAMINOPHEN (TYLENOL PM) 25-500 MG TABS    Take 1 tablet by mouth at bedtime as needed.   IRON 66 MG TABS    Take one tablet once daily   MULTIPLE VITAMIN (MULTIVITAMIN) TABLET    Take 1 tablet by mouth daily.  Modified Medications   No medications on file  Discontinued Medications   No medications on file     Physical Exam: Filed Vitals:   09/03/12 1329  BP: 164/100  Pulse: 69  Temp: 98.1 F (36.7 C)  TempSrc: Oral  Resp: 14  Height: 5\' 1"  (1.549 m)  Weight: 165 lb 3.2 oz (74.934 kg)   Physical Exam  Constitutional: She appears well-developed and well-nourished. No distress.  Cardiovascular: Normal rate, regular rhythm, normal heart sounds and intact distal pulses.   Pulmonary/Chest: Effort normal and breath sounds normal. No respiratory distress.  Skin: Skin is warm and dry.  A couple of small bites on legs--look like mosquito bites not tick bites      Labs reviewed: Basic Metabolic Panel:  Liver Function Tests:  Recent Labs  06/22/12 0849  AST 33  ALT 22  ALKPHOS 57  BILITOT 0.4  PROT 7.0  Lipid Panel:  Recent Labs  06/22/12 0849  HDL 53  LDLCALC 86  TRIG 97  CHOLHDL 3.0   Assessment/Plan Essential hypertension, benign Is now worried about BP.  Denies headaches.  Does get dizziness sometimes.  No chest pains, breathing trouble or difficulty urinating.  Had bp meter calibrated here and where she bought it and it still reads high.  BP read high here,  as well.  BP has been 150s-170s systolic over 70s to 90s.  HR 70s to 90s.  Never adds salt and is trying to be more proactive with avoiding deli meat and reading packages for sodium.  Is hard with what she eats b/c she eats a lot of processed foods.  Can really only eat chicken.  Has seafood allergy.  Teeth are not good so can't eat hard things.  Will begin lisinopril 5mg  po daily and f/u in 1 month for bp check and bmp.  Also, she will continue to check her bp at home and watch her sodium intake more.

## 2012-09-06 ENCOUNTER — Ambulatory Visit: Payer: Self-pay | Admitting: Internal Medicine

## 2012-10-01 ENCOUNTER — Telehealth: Payer: Self-pay | Admitting: *Deleted

## 2012-10-01 ENCOUNTER — Encounter: Payer: Self-pay | Admitting: Internal Medicine

## 2012-10-01 ENCOUNTER — Ambulatory Visit (INDEPENDENT_AMBULATORY_CARE_PROVIDER_SITE_OTHER): Payer: Medicare Other | Admitting: Internal Medicine

## 2012-10-01 ENCOUNTER — Ambulatory Visit: Payer: Medicare Other

## 2012-10-01 VITALS — BP 130/82 | HR 82 | Temp 97.7°F | Resp 18 | Ht 61.0 in | Wt 162.6 lb

## 2012-10-01 DIAGNOSIS — R079 Chest pain, unspecified: Secondary | ICD-10-CM | POA: Diagnosis not present

## 2012-10-01 DIAGNOSIS — E785 Hyperlipidemia, unspecified: Secondary | ICD-10-CM

## 2012-10-01 DIAGNOSIS — I10 Essential (primary) hypertension: Secondary | ICD-10-CM | POA: Diagnosis not present

## 2012-10-01 MED ORDER — LISINOPRIL 10 MG PO TABS: 10.0000 mg | ORAL_TABLET | Freq: Every day | ORAL | Status: DC

## 2012-10-01 NOTE — Assessment & Plan Note (Deleted)
BP readings at home improved, but still not always at goal.  I am concerned about this in view of her episode of chest pain.  I will increase her lisinopril to 10mg  daily from 5mg .  She will continue to monitor her bp at home and I will reevaluate these in 6 wks.

## 2012-10-01 NOTE — Telephone Encounter (Signed)
Dr. Laurena Bering Stress Test Appt: 10/08/2012 @ 9:15 Patient Notified Faxed med list to [facility] Fax# [phone]

## 2012-10-01 NOTE — Progress Notes (Signed)
Patient ID: Jody [name], female   DOB: 10/18/1940, 72 y.o.   MRN: [id number] Location:  [facility]  Allergies  Allergen Reactions  . Shrimp (Shellfish Allergy)     All seafood   Chief Complaint  Patient presents with  . Follow-up    Bp check    HPI: Patient is a 72 y.o. white female seen in the office today for follow-up on her blood pressure.   About 3 weeks ago, she was driving and got a funny feeling in her jaw and tightness in her chest and radiation down her left arm and the arm felt a little numb BP had been high that am.  Felt long, but thinks it was about 15 mins.   Has been anxious about her blood pressure being up. Sometimes will feel a little lightheaded, but self-limited.   Admits to similar episodes, but not that intense.  Says she passed the episodes off and forgot about them.  Tried to pay attention now that she knows her blood pressure is high.   Has noted button coming up on her bp machine that shows irregular rhythm.    Review of Systems:  Review of Systems  Constitutional: Negative for fever and chills.  Eyes: Negative for blurred vision.  Respiratory: Negative for shortness of breath.   Cardiovascular: Positive for chest pain. Negative for palpitations and leg swelling.  Gastrointestinal: Negative for heartburn.  Genitourinary: Negative for dysuria.  Musculoskeletal: Negative for falls.  Neurological: Positive for tingling. Negative for headaches.  Endo/Heme/Allergies: Does not bruise/bleed easily.  Psychiatric/Behavioral: Negative for memory loss. The patient is nervous/anxious.     Past Medical History  Diagnosis Date  . Encounter for long-term (current) use of other medications   . B12 deficiency due to diet   . Anemia, unspecified   . Restless legs syndrome (RLS)   . Osteoarthrosis, unspecified whether generalized or localized, unspecified site   . Lumbago   . Cramp of limb   . Senile osteoporosis   .  Edema of both legs     Past Surgical History  Procedure Laterality Date  . Stomach surgery  1981  . Spine surgery  2005    Social History:   reports that she has quit smoking. She does not have any smokeless tobacco history on file. She reports that she does not drink alcohol or use illicit drugs.  Family History  Problem Relation Age of Onset  . Cancer Sister   . Cancer Brother     Medications: Patient's Medications  New Prescriptions   No medications on file  Previous Medications   BLOOD PRESSURE MONITORING (BLOOD PRESSURE MONITOR/M CUFF) MISC    1 Package by Does not apply route daily.   CALCIUM CARB-CHOLECALCIFEROL (CALCIUM + D3) 600-200 MG-UNIT TABS    Take one tablet three times daily   CELECOXIB (CELEBREX) 100 MG CAPSULE    Take one tablet twice daily for pains   CHOLECALCIFEROL (VITAMIN D) 2000 UNITS TABLET    Take 2,000 Units by mouth daily.   CYANOCOBALAMIN 1000 MCG/ML KIT    Inject as directed. Inject IM once monthly for vitamin b12   DENOSUMAB (PROLIA) 60 MG/ML SOLN INJECTION    Inject 60 mg into the skin every 6 (six) months. Administer in upper arm, thigh, or abdomen   DIPHENHYDRAMINE-ACETAMINOPHEN (TYLENOL PM) 25-500 MG TABS    Take 1 tablet by mouth at bedtime as needed.   IRON 66 MG TABS  Take one tablet once daily   LISINOPRIL (PRINIVIL,ZESTRIL) 5 MG TABLET    Take 1 tablet (5 mg total) by mouth daily.   MULTIPLE VITAMIN (MULTIVITAMIN) TABLET    Take 1 tablet by mouth daily.  Modified Medications   No medications on file  Discontinued Medications   No medications on file     Physical Exam: Filed Vitals:   10/01/12 1006  BP: 130/82  Pulse: 82  Temp: 97.7 F (36.5 C)  TempSrc: Oral  Resp: 18  Height: 5\' 1"  (1.549 m)  Weight: 162 lb 9.6 oz (73.755 kg)  SpO2: 98%  Physical Exam  Nursing note and vitals reviewed. Constitutional: She is oriented to person, place, and time. She appears well-developed and well-nourished. No distress.  HENT:  Head:  Normocephalic and atraumatic.  Cardiovascular: Normal rate, regular rhythm, normal heart sounds and intact distal pulses.   Pulmonary/Chest: Effort normal and breath sounds normal. She has no rales.  Abdominal: Bowel sounds are normal.  Musculoskeletal: Normal range of motion.  Neurological: She is alert and oriented to person, place, and time.     Labs reviewed: Basic Metabolic Panel:  Recent Labs  16/10/96 0849  NA 142  K 4.1  CL 103  CO2 26  GLUCOSE 87  BUN 15  CREATININE 0.81  CALCIUM 9.3   Liver Function Tests:  Recent Labs  06/22/12 0849  AST 33  ALT 22  ALKPHOS 57  BILITOT 0.4  PROT 7.0  Lipid Panel:  Recent Labs  06/22/12 0849  HDL 53  LDLCALC 86  TRIG 97  CHOLHDL 3.0   Assessment/Plan 1. Chest pain -concerning for anginal pain - EKG 12-Lead--was unremarkable with NSR at 63bpm, PACs, no acute ischemia or infarct. - Echocardiogram Dobutamine stress test; Future--with Dr. Verl Dicker office  2. Essential hypertension, benign -was not controlled on lisinopril 5mg .  Increased to 10mg .  [facility].   - Echocardiogram Dobutamine stress test; Future - lisinopril (PRINIVIL,ZESTRIL) 10 MG tablet; Take 1 tablet (10 mg total) by mouth daily.  Dispense: 30 tablet; Refill: 3  3. Other and unspecified hyperlipidemia-lipids were at goal in march - Echocardiogram Dobutamine stress test; Future  Labs/tests ordered:  EKG done during visit, BMP afterward Next appt:  6 wks

## 2012-10-05 ENCOUNTER — Encounter: Payer: Self-pay | Admitting: *Deleted

## 2012-10-12 DIAGNOSIS — R079 Chest pain, unspecified: Secondary | ICD-10-CM | POA: Diagnosis not present

## 2012-10-24 ENCOUNTER — Encounter: Payer: Self-pay | Admitting: Internal Medicine

## 2012-10-31 ENCOUNTER — Other Ambulatory Visit: Payer: Self-pay

## 2012-11-12 ENCOUNTER — Ambulatory Visit (INDEPENDENT_AMBULATORY_CARE_PROVIDER_SITE_OTHER): Payer: Medicare Other | Admitting: Internal Medicine

## 2012-11-12 ENCOUNTER — Encounter: Payer: Self-pay | Admitting: Internal Medicine

## 2012-11-12 VITALS — BP 144/78 | HR 51 | Temp 97.5°F | Resp 14 | Ht 61.0 in | Wt 161.2 lb

## 2012-11-12 DIAGNOSIS — R079 Chest pain, unspecified: Secondary | ICD-10-CM

## 2012-11-12 DIAGNOSIS — I10 Essential (primary) hypertension: Secondary | ICD-10-CM | POA: Diagnosis not present

## 2012-11-12 MED ORDER — LISINOPRIL 10 MG PO TABS: 10.0000 mg | ORAL_TABLET | Freq: Every day | ORAL | Status: DC

## 2012-11-12 NOTE — Patient Instructions (Addendum)
Cut down celebrex to one a day Try restarting baby asa (81mg ) daily Follow up with Dr. Jacinto Halim

## 2012-11-12 NOTE — Progress Notes (Signed)
Patient ID: Jody [name], female   DOB: [birth year], 72 y.o.   MRN: [id number] Location:  [facility] / [facility]   Allergies  Allergen Reactions  . Shrimp [Shellfish Allergy]     All seafood    Chief Complaint  Patient presents with  . Medical Managment of Chronic Issues    HPI: Patient is a 72 y.o. white female seen in the office today for med mgt of chronic diseases.  Since her stress echo, she's had another episode of tightness of her left arm and jaw--was two weeks ago.  No diaphoresis with it.  Was driving.  Denies anxiety or stress when driving.  Not associated with meals as far as she knows.   Some fatigue.    Taking celebrex for a long time for her arthritis.  Was taking celebrex higher dose at first.  Knows if she misses it.  Took arthrotec at one time.  Does use tylenol pm at bedtime for sleep and helps take the edge off her pain.  Hand arthritis severe, and rest of her body aches, too.  Is going to try to cut back on celebrex to one tablet in the morning.  Does exercise while she is there.    Does not take baby asa due to nose bleeds.    Review of Systems:  Review of Systems  Constitutional: Positive for malaise/fatigue. Negative for diaphoresis.  Respiratory: Negative for shortness of breath.   Cardiovascular: Positive for chest pain. Negative for palpitations and leg swelling.       As in hpi  Gastrointestinal: Negative for heartburn, nausea, vomiting and abdominal pain.  Musculoskeletal: Negative for falls.  Skin: Negative for rash.  Neurological: Negative for dizziness, weakness and headaches.  Endo/Heme/Allergies: Does not bruise/bleed easily.  Psychiatric/Behavioral: Negative for memory loss.    Past Medical History  Diagnosis Date  . Encounter for long-term (current) use of other medications   . B12 deficiency due to diet   . Anemia, unspecified   . Restless legs syndrome (RLS)   . Osteoarthrosis, unspecified whether generalized  or localized, unspecified site   . Lumbago   . Cramp of limb   . Senile osteoporosis   . Edema of both legs     Past Surgical History  Procedure Laterality Date  . Stomach surgery  1981  . Spine surgery  2005    Social History:   reports that she has quit smoking. She does not have any smokeless tobacco history on file. She reports that she does not drink alcohol or use illicit drugs.  Family History  Problem Relation Age of Onset  . Cancer Sister   . Cancer Brother     Medications: Patient's Medications  New Prescriptions   No medications on file  Previous Medications   BLOOD PRESSURE MONITORING (BLOOD PRESSURE MONITOR/M CUFF) MISC    1 Package by Does not apply route daily.   CALCIUM CARB-CHOLECALCIFEROL (CALCIUM + D3) 600-200 MG-UNIT TABS    Take one tablet three times daily   CELECOXIB (CELEBREX) 100 MG CAPSULE    Take one tablet twice daily for pains   CHOLECALCIFEROL (VITAMIN D) 2000 UNITS TABLET    Take 2,000 Units by mouth daily.   CYANOCOBALAMIN 1000 MCG/ML KIT    Inject as directed. Inject IM once monthly for vitamin b12   DENOSUMAB (PROLIA) 60 MG/ML SOLN INJECTION    Inject 60 mg into the skin every 6 (six) months. Administer in upper arm, thigh, or  abdomen   DIPHENHYDRAMINE-ACETAMINOPHEN (TYLENOL PM) 25-500 MG TABS    Take 1 tablet by mouth at bedtime as needed.   IRON 66 MG TABS    Take one tablet once daily   MULTIPLE VITAMIN (MULTIVITAMIN) TABLET    Take 1 tablet by mouth daily.  Modified Medications   Modified Medication Previous Medication   LISINOPRIL (PRINIVIL,ZESTRIL) 10 MG TABLET lisinopril (PRINIVIL,ZESTRIL) 10 MG tablet      Take 1 tablet (10 mg total) by mouth daily.    Take 1 tablet (10 mg total) by mouth daily.  Discontinued Medications   No medications on file     Physical Exam: Filed Vitals:   11/12/12 1528  BP: 144/78  Pulse: 51  Temp: 97.5 F (36.4 C)  TempSrc: Oral  Resp: 14  Height: 5\' 1"  (1.549 m)  Weight: 161 lb 3.2 oz (73.12  kg)  Physical Exam  Constitutional: She is oriented to person, place, and time. She appears well-developed and well-nourished.  HENT:  Head: Normocephalic and atraumatic.  Cardiovascular: Normal rate, regular rhythm, normal heart sounds and intact distal pulses.   Pulmonary/Chest: Effort normal and breath sounds normal. No respiratory distress.  Abdominal: Soft. Bowel sounds are normal. She exhibits no distension. There is no tenderness.  Musculoskeletal: Normal range of motion. She exhibits no edema and no tenderness.  Neurological: She is alert and oriented to person, place, and time.  Skin: Skin is warm and dry.    Labs reviewed: Basic Metabolic Panel:  Recent Labs  16/10/96 0849  NA 142  K 4.1  CL 103  CO2 26  GLUCOSE 87  BUN 15  CREATININE 0.81  CALCIUM 9.3   Liver Function Tests:  Recent Labs  06/22/12 0849  AST 33  ALT 22  ALKPHOS 57  BILITOT 0.4  PROT 7.0   Lipid Panel:  Recent Labs  06/22/12 0849  HDL 53  LDLCALC 86  TRIG 97  CHOLHDL 3.0  Past procedures: 0/45/40:  Stress echo--did exercise stress --read by Dr. Jacinto Halim  Assessment/Plan 1. Essential hypertension, benign -refilled lisinopril - lisinopril (PRINIVIL,ZESTRIL) 10 MG tablet; Take 1 tablet (10 mg total) by mouth daily.  Dispense: 90 tablet; Refill: 3 2. Chest pain -concerning for cardiac etiology - Ambulatory referral to Cardiology due to another concerning episode of chest pain 2 weeks ago concerning for cardiac etiology despite normal stress echo -reduce cardiac risks as much as possible:  Add baby aspirin, does not think she can do w/o celebrex--reduce to one tablet daily and use tylenol ES for breakthrough pain  Labs/tests ordered: f/u Dr. Jacinto Halim Next appt: 3 mos (is going to the beach in between so needs her cardiology appt this week)

## 2012-11-13 DIAGNOSIS — I10 Essential (primary) hypertension: Secondary | ICD-10-CM | POA: Diagnosis not present

## 2012-11-13 DIAGNOSIS — R0789 Other chest pain: Secondary | ICD-10-CM | POA: Diagnosis not present

## 2013-01-28 ENCOUNTER — Encounter: Payer: Self-pay | Admitting: Internal Medicine

## 2013-01-28 ENCOUNTER — Ambulatory Visit (INDEPENDENT_AMBULATORY_CARE_PROVIDER_SITE_OTHER): Payer: Medicare Other | Admitting: Internal Medicine

## 2013-01-28 VITALS — BP 122/70 | HR 69 | Temp 97.1°F | Resp 16 | Wt 158.6 lb

## 2013-01-28 DIAGNOSIS — Z23 Encounter for immunization: Secondary | ICD-10-CM | POA: Diagnosis not present

## 2013-01-28 DIAGNOSIS — E669 Obesity, unspecified: Secondary | ICD-10-CM

## 2013-01-28 DIAGNOSIS — R609 Edema, unspecified: Secondary | ICD-10-CM

## 2013-01-28 DIAGNOSIS — M810 Age-related osteoporosis without current pathological fracture: Secondary | ICD-10-CM | POA: Diagnosis not present

## 2013-01-28 DIAGNOSIS — E785 Hyperlipidemia, unspecified: Secondary | ICD-10-CM

## 2013-01-28 DIAGNOSIS — I10 Essential (primary) hypertension: Secondary | ICD-10-CM | POA: Diagnosis not present

## 2013-01-28 DIAGNOSIS — R600 Localized edema: Secondary | ICD-10-CM

## 2013-01-28 DIAGNOSIS — R0789 Other chest pain: Secondary | ICD-10-CM | POA: Diagnosis not present

## 2013-01-28 DIAGNOSIS — E538 Deficiency of other specified B group vitamins: Secondary | ICD-10-CM

## 2013-01-28 NOTE — Progress Notes (Signed)
Patient ID: MINETTA KRISHER, female   DOB: 10/24/1940, 72 y.o.   MRN: [id number] Location:  [facility] / [facility]   Allergies  Allergen Reactions  . Shrimp [Shellfish Allergy]     All seafood    Chief Complaint  Patient presents with  . Medical Managment of Chronic Issues    3 month f/u    HPI: Patient is a 72 y.o. white female seen in the office today for medical mgt of chronic diseases.  Is taking celebrex only once a day now--has noted no change in arthritis pain.  No episodes of funny feeling in her chest since she was here three mos ago.  Lost three lbs.  Has been eating better and exercising.  Not perfect, but better.  Had no swelling at all while at the beach.  Tried to cut out a lot of sodium.  Has been avoiding potato chips and similar salty snacks.  Goes to aquatic center when at [employer] (there 2 mos).    Hands hurt badly, but this is not different.  Also has a lot of pain across her lower back and her knees.  Get very stiff first thing in am and at night.    Review of Systems:  Review of Systems  Constitutional: Positive for weight loss. Negative for fever, chills and malaise/fatigue.  HENT: Negative for congestion.   Eyes: Negative for blurred vision.  Respiratory: Negative for cough and shortness of breath.   Cardiovascular: Negative for chest pain, palpitations and leg swelling.  Gastrointestinal: Negative for heartburn, constipation, blood in stool and melena.  Genitourinary: Negative for dysuria.  Musculoskeletal: Positive for back pain, joint pain and myalgias. Negative for falls and neck pain.  Neurological: Negative for dizziness, focal weakness, loss of consciousness, weakness and headaches.  Psychiatric/Behavioral: Negative for depression and memory loss.     Past Medical History  Diagnosis Date  . Encounter for long-term (current) use of other medications   . B12 deficiency due to diet   . Anemia, unspecified   . Restless  legs syndrome (RLS)   . Osteoarthrosis, unspecified whether generalized or localized, unspecified site   . Lumbago   . Cramp of limb   . Senile osteoporosis   . Edema of both legs     Past Surgical History  Procedure Laterality Date  . Stomach surgery  1981  . Spine surgery  2005    Social History:   reports that she has quit smoking. She does not have any smokeless tobacco history on file. She reports that she does not drink alcohol or use illicit drugs.  Family History  Problem Relation Age of Onset  . Cancer Sister   . Cancer Brother     Medications: Patient's Medications  New Prescriptions   No medications on file  Previous Medications   ASPIRIN EC 81 MG TABLET    Take 81 mg by mouth daily.   BLOOD PRESSURE MONITORING (BLOOD PRESSURE MONITOR/M CUFF) MISC    1 Package by Does not apply route daily.   CALCIUM CARB-CHOLECALCIFEROL (CALCIUM + D3) 600-200 MG-UNIT TABS    Take one tablet three times daily   CELECOXIB (CELEBREX) 100 MG CAPSULE    100 mg daily. Take one tablet twice daily for pains   CHOLECALCIFEROL (VITAMIN D) 2000 UNITS TABLET    Take 2,000 Units by mouth daily.   DENOSUMAB (PROLIA) 60 MG/ML SOLN INJECTION    Inject 60 mg into the skin every 6 (six)  months. Administer in upper arm, thigh, or abdomen   DIPHENHYDRAMINE-ACETAMINOPHEN (TYLENOL PM) 25-500 MG TABS    Take 1 tablet by mouth at bedtime as needed.   IRON 66 MG TABS    Take one tablet once daily   LISINOPRIL (PRINIVIL,ZESTRIL) 10 MG TABLET    Take 1 tablet (10 mg total) by mouth daily.   MULTIPLE VITAMIN (MULTIVITAMIN) TABLET    Take 1 tablet by mouth daily.  Modified Medications   No medications on file  Discontinued Medications   CYANOCOBALAMIN 1000 MCG/ML KIT    Inject as directed. Inject IM once monthly for vitamin b12     Physical Exam: Filed Vitals:   01/28/13 0852  BP: 122/70  Pulse: 69  Temp: 97.1 F (36.2 C)  TempSrc: Oral  Resp: 16  Weight: 158 lb 9.6 oz (71.94 kg)  SpO2: 95%   Physical Exam  Constitutional: She is oriented to person, place, and time. She appears well-developed and well-nourished. No distress.  Obese white female  Neck: No JVD present.  Cardiovascular: Normal rate, regular rhythm, normal heart sounds and intact distal pulses.   Pulmonary/Chest: Effort normal and breath sounds normal. No respiratory distress.  Abdominal: Soft. Bowel sounds are normal. She exhibits no distension. There is no tenderness.  Musculoskeletal: She exhibits edema.  1+ edema bilateral LE  Neurological: She is alert and oriented to person, place, and time. No cranial nerve deficit.  Skin: Skin is warm and dry.  Psychiatric: She has a normal mood and affect.    Labs reviewed: Basic Metabolic Panel:  Recent Labs  16/10/96 0849  NA 142  K 4.1  CL 103  CO2 26  GLUCOSE 87  BUN 15  CREATININE 0.81  CALCIUM 9.3   Liver Function Tests:  Recent Labs  06/22/12 0849  AST 33  ALT 22  ALKPHOS 57  BILITOT 0.4  PROT 7.0  Lipid Panel:  Recent Labs  06/22/12 0849  HDL 53  LDLCALC 86  TRIG 97  CHOLHDL 3.0   Assessment/Plan 1. Need for prophylactic vaccination and inoculation against influenza -flu shot given  2. Essential hypertension, benign -at goal with current therapy, no orthostatic hypotension, has been watching her sodium more now -no further episodes of chest discomfort in past 3 mos - CBC with Differential; Future - Comprehensive metabolic panel; Future  3. Senile osteoporosis -will be coming in for her prolia injection in December and will get her labs done at that time - Vitamin D, 25-hydroxy; Future  4. Edema of both legs -has improved since watching sodium in her diet and exercising more (swimming while at the beach)  5. B12 deficiency due to diet - B12 and Folate Panel; Future -stable, did not need injections recently, but will f/u level  6. Hyperlipidemia LDL goal < 100 - Lipid panel; Future -is doing better with her diet and lost  3 lbs  7. Obesity, unspecified -will check sugar average due to weight and h/o nonadherence to diet though improved recently - Hemoglobin A1c; Future  Labs/tests ordered:  Cbc, cmp, vit D, hba1c, flp 12/8 when comes for prolia Next appt:  6 mos for EV

## 2013-01-28 NOTE — Patient Instructions (Signed)
Co Enzyme Q10

## 2013-01-29 DIAGNOSIS — D239 Other benign neoplasm of skin, unspecified: Secondary | ICD-10-CM | POA: Diagnosis not present

## 2013-01-29 DIAGNOSIS — Z85820 Personal history of malignant melanoma of skin: Secondary | ICD-10-CM | POA: Diagnosis not present

## 2013-01-29 DIAGNOSIS — L570 Actinic keratosis: Secondary | ICD-10-CM | POA: Diagnosis not present

## 2013-01-31 ENCOUNTER — Encounter: Payer: Self-pay | Admitting: Podiatry

## 2013-01-31 ENCOUNTER — Ambulatory Visit (INDEPENDENT_AMBULATORY_CARE_PROVIDER_SITE_OTHER): Payer: Medicare Other

## 2013-01-31 ENCOUNTER — Ambulatory Visit (INDEPENDENT_AMBULATORY_CARE_PROVIDER_SITE_OTHER): Payer: Medicare Other | Admitting: Podiatry

## 2013-01-31 VITALS — BP 124/60 | HR 86 | Resp 12 | Ht 60.0 in | Wt 157.0 lb

## 2013-01-31 DIAGNOSIS — R52 Pain, unspecified: Secondary | ICD-10-CM

## 2013-01-31 DIAGNOSIS — M722 Plantar fascial fibromatosis: Secondary | ICD-10-CM | POA: Diagnosis not present

## 2013-01-31 MED ORDER — TRIAMCINOLONE ACETONIDE 10 MG/ML IJ SUSP
5.0000 mg | Freq: Once | INTRAMUSCULAR | Status: AC
  Administered 2013-01-31: 5 mg via INTRA_ARTICULAR

## 2013-01-31 NOTE — Progress Notes (Signed)
N-SORE L-RT FOOT HEEL D-2 MONTHS O-SLOWLY C-WALKING A-WALKING T-N/A

## 2013-01-31 NOTE — Progress Notes (Signed)
Subjective:     Patient ID: Jody [name], female   DOB: 1941/01/25, 72 y.o.   MRN: [id number]  HPI patient presents stating I am having a lot of pain in my right heel of 2 months duration   Review of Systems  All other systems reviewed and are negative.       Objective:   Physical Exam  Nursing note and vitals reviewed. Constitutional: She is oriented to person, place, and time.  Cardiovascular: Intact distal pulses.   Musculoskeletal: Normal range of motion.  Neurological: She is oriented to person, place, and time.   patient right heel is very tender in the medial calcaneal tubercle fascia insertion     Assessment:     Acute plantar fasciitis right heel    Plan:     H&P and x-ray reviewed. Injected the right plantar fascia 3 mg Kenalog 5 mg Xylocaine Marcaine mixture and reappoint when she returns from her trip in 4 weeks

## 2013-02-27 DIAGNOSIS — L570 Actinic keratosis: Secondary | ICD-10-CM | POA: Diagnosis not present

## 2013-02-28 ENCOUNTER — Encounter: Payer: Self-pay | Admitting: Podiatry

## 2013-02-28 ENCOUNTER — Ambulatory Visit (INDEPENDENT_AMBULATORY_CARE_PROVIDER_SITE_OTHER): Payer: Medicare Other | Admitting: Podiatry

## 2013-02-28 VITALS — BP 115/54 | HR 87 | Resp 16

## 2013-02-28 DIAGNOSIS — M722 Plantar fascial fibromatosis: Secondary | ICD-10-CM | POA: Diagnosis not present

## 2013-02-28 MED ORDER — TRIAMCINOLONE ACETONIDE 10 MG/ML IJ SUSP
10.0000 mg | Freq: Once | INTRAMUSCULAR | Status: AC
  Administered 2013-02-28: 10 mg

## 2013-02-28 NOTE — Progress Notes (Signed)
Subjective:     Patient ID: Jody [name], female   DOB: 09-08-1940, 72 y.o.   MRN: [id number]  HPI patient states my heel is improved but still sore when I walk on it a lot   Review of Systems     Objective:   Physical Exam Neurovascular status intact with no health history changes. Pain to palpation medial fascia band right    Assessment:     Plan her fasciitis right with inflammation and fluid around the medial band    Plan:     Injected the plantar fascia 3 mm Kenalog 5 of Xylocaine Marcaine mixture and instructed on physical therapy and supportive shoes. Reappoint her recheck as needed

## 2013-03-04 ENCOUNTER — Ambulatory Visit (INDEPENDENT_AMBULATORY_CARE_PROVIDER_SITE_OTHER): Payer: Medicare Other | Admitting: Internal Medicine

## 2013-03-04 ENCOUNTER — Other Ambulatory Visit: Payer: Self-pay | Admitting: Nurse Practitioner

## 2013-03-04 ENCOUNTER — Other Ambulatory Visit: Payer: Medicare Other

## 2013-03-04 DIAGNOSIS — I10 Essential (primary) hypertension: Secondary | ICD-10-CM | POA: Diagnosis not present

## 2013-03-04 DIAGNOSIS — E538 Deficiency of other specified B group vitamins: Secondary | ICD-10-CM

## 2013-03-04 DIAGNOSIS — E785 Hyperlipidemia, unspecified: Secondary | ICD-10-CM | POA: Diagnosis not present

## 2013-03-04 DIAGNOSIS — E669 Obesity, unspecified: Secondary | ICD-10-CM | POA: Diagnosis not present

## 2013-03-04 DIAGNOSIS — M810 Age-related osteoporosis without current pathological fracture: Secondary | ICD-10-CM

## 2013-03-04 MED ORDER — LISINOPRIL 10 MG PO TABS: 10.0000 mg | ORAL_TABLET | Freq: Every day | ORAL | Status: DC

## 2013-03-04 MED ORDER — DENOSUMAB 60 MG/ML ~~LOC~~ SOLN
60.0000 mg | Freq: Once | SUBCUTANEOUS | Status: AC
  Administered 2013-03-04: 60 mg via SUBCUTANEOUS

## 2013-03-05 ENCOUNTER — Encounter: Payer: Self-pay | Admitting: *Deleted

## 2013-03-05 LAB — CBC WITH DIFFERENTIAL/PLATELET
Basophils Absolute: 0 10*3/uL (ref 0.0–0.2)
Basos: 1 %
Eos: 4 %
Eosinophils Absolute: 0.3 10*3/uL (ref 0.0–0.4)
HCT: 34.6 % (ref 34.0–46.6)
Hemoglobin: 11.4 g/dL (ref 11.1–15.9)
Immature Grans (Abs): 0 10*3/uL (ref 0.0–0.1)
Immature Granulocytes: 0 %
Lymphocytes Absolute: 1.8 10*3/uL (ref 0.7–3.1)
Lymphs: 30 %
MCH: 30.9 pg (ref 26.6–33.0)
MCHC: 32.9 g/dL (ref 31.5–35.7)
MCV: 94 fL (ref 79–97)
Monocytes Absolute: 0.5 10*3/uL (ref 0.1–0.9)
Monocytes: 8 %
Neutrophils Absolute: 3.4 10*3/uL (ref 1.4–7.0)
Neutrophils Relative %: 57 %
RBC: 3.69 x10E6/uL — ABNORMAL LOW (ref 3.77–5.28)
RDW: 12.7 % (ref 12.3–15.4)
WBC: 6 10*3/uL (ref 3.4–10.8)

## 2013-03-05 LAB — VITAMIN D 25 HYDROXY (VIT D DEFICIENCY, FRACTURES): Vit D, 25-Hydroxy: 58.5 ng/mL (ref 30.0–100.0)

## 2013-03-05 LAB — COMPREHENSIVE METABOLIC PANEL
ALT: 27 IU/L (ref 0–32)
AST: 38 IU/L (ref 0–40)
Albumin/Globulin Ratio: 1.8 (ref 1.1–2.5)
Albumin: 4.6 g/dL (ref 3.5–4.8)
Alkaline Phosphatase: 47 IU/L (ref 39–117)
BUN/Creatinine Ratio: 18 (ref 11–26)
BUN: 17 mg/dL (ref 8–27)
CO2: 22 mmol/L (ref 18–29)
Calcium: 9.9 mg/dL (ref 8.6–10.2)
Chloride: 101 mmol/L (ref 97–108)
Creatinine, Ser: 0.96 mg/dL (ref 0.57–1.00)
GFR calc Af Amer: 67 mL/min/{1.73_m2} (ref >59)
GFR calc non Af Amer: 58 mL/min/{1.73_m2} — ABNORMAL LOW (ref >59)
Globulin, Total: 2.5 g/dL (ref 1.5–4.5)
Glucose: 68 mg/dL (ref 65–99)
Potassium: 4.3 mmol/L (ref 3.5–5.2)
Sodium: 140 mmol/L (ref 134–144)
Total Bilirubin: 0.4 mg/dL (ref 0.0–1.2)
Total Protein: 7.1 g/dL (ref 6.0–8.5)

## 2013-03-05 LAB — LIPID PANEL
Chol/HDL Ratio: 2.8 ratio units (ref 0.0–4.4)
Cholesterol, Total: 166 mg/dL (ref 100–199)
HDL: 59 mg/dL (ref >39)
LDL Calculated: 88 mg/dL (ref 0–99)
Triglycerides: 95 mg/dL (ref 0–149)
VLDL Cholesterol Cal: 19 mg/dL (ref 5–40)

## 2013-03-05 LAB — HEMOGLOBIN A1C
Est. average glucose Bld gHb Est-mCnc: 114 mg/dL
Hgb A1c MFr Bld: 5.6 % (ref 4.8–5.6)

## 2013-03-05 LAB — B12 AND FOLATE PANEL
Folate: >19.9 ng/mL (ref >3.0)
Vitamin B-12: 295 pg/mL (ref 211–946)

## 2013-03-29 ENCOUNTER — Other Ambulatory Visit: Payer: Medicare Other

## 2013-05-02 DIAGNOSIS — H5231 Anisometropia: Secondary | ICD-10-CM | POA: Diagnosis not present

## 2013-05-02 DIAGNOSIS — H52229 Regular astigmatism, unspecified eye: Secondary | ICD-10-CM | POA: Diagnosis not present

## 2013-05-02 DIAGNOSIS — H2589 Other age-related cataract: Secondary | ICD-10-CM | POA: Diagnosis not present

## 2013-05-02 DIAGNOSIS — H43819 Vitreous degeneration, unspecified eye: Secondary | ICD-10-CM | POA: Diagnosis not present

## 2013-05-25 ENCOUNTER — Encounter: Payer: Self-pay | Admitting: Internal Medicine

## 2013-05-25 NOTE — Progress Notes (Signed)
Patient ID: Jody [name], female   DOB: 02-Feb-1941, 73 y.o.   MRN: [id number] Prolia injection given by CMA.

## 2013-06-18 DIAGNOSIS — Z803 Family history of malignant neoplasm of breast: Secondary | ICD-10-CM | POA: Diagnosis not present

## 2013-06-18 DIAGNOSIS — Z1231 Encounter for screening mammogram for malignant neoplasm of breast: Secondary | ICD-10-CM | POA: Diagnosis not present

## 2013-06-21 ENCOUNTER — Encounter: Payer: Self-pay | Admitting: *Deleted

## 2013-07-09 ENCOUNTER — Encounter: Payer: Self-pay | Admitting: Internal Medicine

## 2013-07-29 ENCOUNTER — Encounter: Payer: Medicare Other | Admitting: Internal Medicine

## 2013-09-02 ENCOUNTER — Encounter: Payer: Medicare Other | Admitting: Internal Medicine

## 2013-11-07 ENCOUNTER — Other Ambulatory Visit: Payer: Self-pay | Admitting: Internal Medicine

## 2013-11-14 ENCOUNTER — Encounter: Payer: Self-pay | Admitting: Internal Medicine

## 2013-11-14 ENCOUNTER — Ambulatory Visit (INDEPENDENT_AMBULATORY_CARE_PROVIDER_SITE_OTHER): Payer: Medicare Other | Admitting: Internal Medicine

## 2013-11-14 VITALS — BP 118/70 | HR 68 | Temp 97.8°F | Ht 61.5 in | Wt 146.8 lb

## 2013-11-14 DIAGNOSIS — M810 Age-related osteoporosis without current pathological fracture: Secondary | ICD-10-CM | POA: Diagnosis not present

## 2013-11-14 DIAGNOSIS — I10 Essential (primary) hypertension: Secondary | ICD-10-CM

## 2013-11-14 DIAGNOSIS — R079 Chest pain, unspecified: Secondary | ICD-10-CM | POA: Diagnosis not present

## 2013-11-14 DIAGNOSIS — E559 Vitamin D deficiency, unspecified: Secondary | ICD-10-CM

## 2013-11-14 DIAGNOSIS — Z1211 Encounter for screening for malignant neoplasm of colon: Secondary | ICD-10-CM

## 2013-11-14 DIAGNOSIS — E785 Hyperlipidemia, unspecified: Secondary | ICD-10-CM

## 2013-11-14 DIAGNOSIS — E538 Deficiency of other specified B group vitamins: Secondary | ICD-10-CM | POA: Diagnosis not present

## 2013-11-14 DIAGNOSIS — Z0000 Encounter for general adult medical examination without abnormal findings: Secondary | ICD-10-CM

## 2013-11-14 MED ORDER — CELEBREX 100 MG PO CAPS: 100.0000 mg | ORAL_CAPSULE | Freq: Every day | ORAL | Status: DC

## 2013-11-14 MED ORDER — LISINOPRIL 5 MG PO TABS: ORAL_TABLET | ORAL | Status: DC

## 2013-11-14 NOTE — Patient Instructions (Signed)
Please bring [place] a copy of your living will when you come in for your labs tomorrow.  Make blood pressure med changes as below.  Think about cologuard test and let me know  We'll decide what to do about your bone density based on the next test in April 2016

## 2013-11-14 NOTE — Progress Notes (Signed)
Patient ID: Jody [name], female   DOB: 03-29-1940, 73 y.o.   MRN: [id number]   Location:  [facility]  Code Status: has living will and a DNR--she will bring a copy when she comes for labs tomorrow  Allergies  Allergen Reactions  . Shrimp [Shellfish Allergy]     All seafood    Chief Complaint  Patient presents with  . Annual Exam    Physical with no labs prior  . other    neg for falls/depression screening, has questions regarding the Lisinopril & Prolia medications.    HPI: Patient is a 73 y.o. white female seen in the office today for annual exam.  She is doing fine.  Has several questions.  She had her labs done in December.  Discussed using mychart.  Had to reschedule her physical b/c of conflicts she had.  Wants to do next labs asap b/c leaves town next week for 2 mos.  Wants homocysteine checked.  Says some of her family has had anemia.  Had mammo in May 2015.  Wants to continue them annually b/c she neglects her self exams and says she has a lot of fat.    Discussed annual flu shot.    Has been at least 10 yrs since cscope.  Agrees to get again when she returns her in Nov with Dr. Amedeo Plenty.  She wants to schedule it.    [facility]--takes celebrex only daily and has backlog.  Wants brand necessary.  Was using prolia.  Last was due 6/15.  Had the two episodes of chest pain.  Worries that it is due to the prolia--noted a correlation timewise herself (within 30 days of injection).  In 2000, was already taking fosamax, but not calcium.  Continued to lose bone density.  Finally started to take calcium with D a few years later.  Did forteo for 2 years.  Then started on prolia just before she started coming here.    Bone density was 4/14 and showed left forearm T was -4.  Has been checking bp regularly.  400-867 systolic with HR in 61P.  Has been dizzy at times and bp 97-104 with HR 80s.  Has not been doing as much swimming b/c she's  been with her granddaughter.  Will get back to it at the beach.  Says she is getting stiff in the joints w/o working out.   Has lost 20 lbs since coming here.   Says she also shrunk.  Review of Systems:  Review of Systems  Constitutional: Positive for weight loss. Negative for fever, chills and malaise/fatigue.  HENT: Negative for congestion and hearing loss.   Eyes: Negative for blurred vision.  Respiratory: Negative for cough and shortness of breath.   Cardiovascular: Positive for leg swelling. Negative for chest pain.       Edema has improved with lower sodium diet  Gastrointestinal: Negative for heartburn, abdominal pain, constipation, blood in stool and melena.  Genitourinary: Negative for dysuria, urgency and frequency.  Musculoskeletal: Positive for joint pain. Negative for falls.  Skin: Negative for rash.  Neurological: Negative for dizziness, sensory change, focal weakness, loss of consciousness, weakness and headaches.  Endo/Heme/Allergies: Does not bruise/bleed easily.  Psychiatric/Behavioral: Negative for depression and memory loss.     Past Medical History  Diagnosis Date  . Encounter for long-term (current) use of other medications   . B12 deficiency due to diet   . Anemia, unspecified   . Restless  legs syndrome (RLS)   . Osteoarthrosis, unspecified whether generalized or localized, unspecified site   . Lumbago   . Cramp of limb   . Senile osteoporosis   . Edema of both legs   . Obesity     Past Surgical History  Procedure Laterality Date  . Stomach surgery  1981  . Spine surgery  2005    Social History:   reports that she has quit smoking. She does not have any smokeless tobacco history on file. She reports that she does not drink alcohol or use illicit drugs.  Family History  Problem Relation Age of Onset  . Cancer Sister   . Cancer Brother     Medications: Patient's Medications  New Prescriptions   No medications on file  Previous Medications    ASPIRIN EC 81 MG TABLET    Take 81 mg by mouth daily.   CALCIUM CARB-CHOLECALCIFEROL (CALCIUM + D3) 600-200 MG-UNIT TABS    Take one tablet three times daily   CHOLECALCIFEROL (VITAMIN D) 2000 UNITS TABLET    Take 2,000 Units by mouth daily.   DENOSUMAB (PROLIA) 60 MG/ML SOLN INJECTION    Inject 60 mg into the skin every 6 (six) months. Administer in upper arm, thigh, or abdomen   DIPHENHYDRAMINE-ACETAMINOPHEN (TYLENOL PM) 25-500 MG TABS    Take 1 tablet by mouth at bedtime as needed.   IRON 66 MG TABS    Take one tablet once daily   LISINOPRIL (PRINIVIL,ZESTRIL) 10 MG TABLET    TAKE 1 TABLET DAILY   MULTIPLE VITAMIN (MULTIVITAMIN) TABLET    Take 1 tablet by mouth daily.  Modified Medications   Modified Medication Previous Medication   CELECOXIB (CELEBREX) 100 MG CAPSULE CELEBREX 100 MG capsule      TAKE 1 CAPSULE once A DAY FOR PAINS    TAKE 1 CAPSULE TWICE A DAY FOR PAINS  Discontinued Medications   BLOOD PRESSURE MONITORING (BLOOD PRESSURE MONITOR/M CUFF) MISC    1 Package by Does not apply route daily.     Physical Exam: Filed Vitals:   11/14/13 1356  BP: 118/70  Pulse: 68  Temp: 97.8 F (36.6 C)  TempSrc: Oral  Height: 5' 1.5" (1.562 m)  Weight: 146 lb 12.8 oz (66.588 kg)  SpO2: 98%  Physical Exam  Constitutional: She is oriented to person, place, and time. She appears well-developed and well-nourished. No distress.  HENT:  Head: Normocephalic and atraumatic.  Right Ear: External ear normal.  Left Ear: External ear normal.  Nose: Nose normal.  Mouth/Throat: Oropharynx is clear and moist. No oropharyngeal exudate.  Eyes: Conjunctivae and EOM are normal. Pupils are equal, round, and reactive to light.  Wears glasses  Neck: Normal range of motion. Neck supple. No JVD present. No thyromegaly present.  Cardiovascular: Normal rate, regular rhythm, normal heart sounds and intact distal pulses.   Pulmonary/Chest: Effort normal and breath sounds normal. No respiratory distress.  Right breast exhibits no inverted nipple, no mass, no nipple discharge, no skin change and no tenderness. Left breast exhibits no inverted nipple, no mass, no nipple discharge, no skin change and no tenderness. Breasts are symmetrical. There is no breast swelling.  Abdominal: Soft. Bowel sounds are normal. She exhibits no distension and no mass. There is no tenderness. There is no rebound and no guarding. No hernia.  Genitourinary: No breast bleeding.  Musculoskeletal: Normal range of motion. She exhibits no tenderness.  Mild nonpitting edema bilateral ankles  Lymphadenopathy:    She has no  cervical adenopathy.  Neurological: She is alert and oriented to person, place, and time. She has normal reflexes. No cranial nerve deficit.  Skin: Skin is warm and dry.  Psychiatric: She has a normal mood and affect. Her behavior is normal. Judgment and thought content normal.   Labs reviewed: Basic Metabolic Panel:  Recent Labs  03/04/13 0842  NA 140  K 4.3  CL 101  CO2 22  GLUCOSE 68  BUN 17  CREATININE 0.96  CALCIUM 9.9   Liver Function Tests:  Recent Labs  03/04/13 0842  AST 38  ALT 27  ALKPHOS 47  BILITOT 0.4  PROT 7.1  CBC:  Recent Labs  03/04/13 0842  WBC 6.0  NEUTROABS 3.4  HGB 11.4  HCT 34.6  MCV 94   Lipid Panel:  Recent Labs  03/04/13 0842  HDL 59  LDLCALC 88  TRIG 95  CHOLHDL 2.8   Lab Results  Component Value Date   HGBA1C 5.6 03/04/2013    Assessment/Plan 1. Senile osteoporosis - she would like to repeat her bone density in 4/15, and determine if she will go back on treatment based on that -she took the prolia info again but thinks it may have been the cause of her chest pains she had so she is hesitant to take it -she may consider zoledronic acid infusions instead - Comprehensive metabolic panel; Future  2. Chest pain, unspecified chest pain type -no further episodes--she correlates them timewise within a month of her 2 prolia  injections -cardiac workup was negative -she has tried to do better with her sodium in her diet  3. Essential hypertension, benign -cont to cut back on sodium and reduce lisinopril to 5mg  due to lightheadedness associated with some lower bps she's had lately in 90s to lower 100s -goal is <140/90 due to lack of other risk factors -advised to let me know if she is running consistently over that so we can make appropriate adjustments  4. B12 deficiency due to diet - her sister is curious about her homocysteine level--last b12 and folate were normal - CBC With differential/Platelet; Future - Comprehensive metabolic panel; Future - Homocysteine; Future  5. Unspecified vitamin D deficiency -cont ca with D and additional D due to osteoporosis  6. Encounter for general health examination - reviewed her preventive care needs/desires  - CBC With differential/Platelet; Future - Comprehensive metabolic panel; Future  7. Other and unspecified hyperlipidemia - cont dietary changes, reassess LDL - Lipid panel; Future  8. Screen for colon cancer - agrees to consider cologuard testing--given information and she is to let us know if she wants it so paperwork can be done  Labs/tests ordered:   Orders Placed This Encounter  Procedures  . CBC With differential/Platelet    Standing Status: Future     Number of Occurrences: 1     Standing Expiration Date: 12/15/2013  . Comprehensive metabolic panel    Standing Status: Future     Number of Occurrences: 1     Standing Expiration Date: 12/15/2013  . Lipid panel    Standing Status: Future     Number of Occurrences: 1     Standing Expiration Date: 12/15/2013  . Homocysteine    Standing Status: Future     Number of Occurrences: 1     Standing Expiration Date: 12/15/2013  cologuard paperwork provided, but form not yet done  Next appt:  6 mos f/u to get bone density ordered and cologuard if desired

## 2013-11-15 ENCOUNTER — Other Ambulatory Visit: Payer: Medicare Other

## 2013-11-15 DIAGNOSIS — Z0000 Encounter for general adult medical examination without abnormal findings: Secondary | ICD-10-CM

## 2013-11-15 DIAGNOSIS — E785 Hyperlipidemia, unspecified: Secondary | ICD-10-CM | POA: Diagnosis not present

## 2013-11-15 DIAGNOSIS — D649 Anemia, unspecified: Secondary | ICD-10-CM | POA: Diagnosis not present

## 2013-11-15 DIAGNOSIS — M810 Age-related osteoporosis without current pathological fracture: Secondary | ICD-10-CM

## 2013-11-15 DIAGNOSIS — E538 Deficiency of other specified B group vitamins: Secondary | ICD-10-CM | POA: Diagnosis not present

## 2013-11-17 LAB — CBC WITH DIFFERENTIAL
Basophils Absolute: 0 10*3/uL (ref 0.0–0.2)
Basos: 1 %
Eos: 5 %
Eosinophils Absolute: 0.3 10*3/uL (ref 0.0–0.4)
HCT: 31.8 % — ABNORMAL LOW (ref 34.0–46.6)
Hemoglobin: 10.4 g/dL — ABNORMAL LOW (ref 11.1–15.9)
Immature Grans (Abs): 0 10*3/uL (ref 0.0–0.1)
Immature Granulocytes: 0 %
Lymphocytes Absolute: 2 10*3/uL (ref 0.7–3.1)
Lymphs: 33 %
MCH: 30.7 pg (ref 26.6–33.0)
MCHC: 32.7 g/dL (ref 31.5–35.7)
MCV: 94 fL (ref 79–97)
Monocytes Absolute: 0.7 10*3/uL (ref 0.1–0.9)
Monocytes: 11 %
Neutrophils Absolute: 3 10*3/uL (ref 1.4–7.0)
Neutrophils Relative %: 50 %
Platelets: 316 10*3/uL (ref 150–379)
RBC: 3.39 x10E6/uL — ABNORMAL LOW (ref 3.77–5.28)
RDW: 13.2 % (ref 12.3–15.4)
WBC: 6 10*3/uL (ref 3.4–10.8)

## 2013-11-17 LAB — COMPREHENSIVE METABOLIC PANEL
ALT: 26 IU/L (ref 0–32)
AST: 36 IU/L (ref 0–40)
Albumin/Globulin Ratio: 1.7 (ref 1.1–2.5)
Albumin: 4.3 g/dL (ref 3.5–4.8)
Alkaline Phosphatase: 50 IU/L (ref 39–117)
BUN/Creatinine Ratio: 29 — ABNORMAL HIGH (ref 11–26)
BUN: 29 mg/dL — ABNORMAL HIGH (ref 8–27)
CO2: 23 mmol/L (ref 18–29)
Calcium: 9.9 mg/dL (ref 8.7–10.3)
Chloride: 103 mmol/L (ref 97–108)
Creatinine, Ser: 0.99 mg/dL (ref 0.57–1.00)
GFR calc Af Amer: 64 mL/min/{1.73_m2} (ref >59)
GFR calc non Af Amer: 56 mL/min/{1.73_m2} — ABNORMAL LOW (ref >59)
Globulin, Total: 2.5 g/dL (ref 1.5–4.5)
Glucose: 89 mg/dL (ref 65–99)
Potassium: 4.9 mmol/L (ref 3.5–5.2)
Sodium: 141 mmol/L (ref 134–144)
Total Bilirubin: 0.3 mg/dL (ref 0.0–1.2)
Total Protein: 6.8 g/dL (ref 6.0–8.5)

## 2013-11-17 LAB — LIPID PANEL
Chol/HDL Ratio: 2.9 ratio units (ref 0.0–4.4)
Cholesterol, Total: 153 mg/dL (ref 100–199)
HDL: 52 mg/dL (ref >39)
LDL Calculated: 82 mg/dL (ref 0–99)
Triglycerides: 97 mg/dL (ref 0–149)
VLDL Cholesterol Cal: 19 mg/dL (ref 5–40)

## 2013-11-17 LAB — HOMOCYSTEINE: Homocysteine: 23.1 umol/L — ABNORMAL HIGH (ref 0.0–15.0)

## 2013-11-18 ENCOUNTER — Ambulatory Visit (INDEPENDENT_AMBULATORY_CARE_PROVIDER_SITE_OTHER): Payer: Medicare Other | Admitting: Internal Medicine

## 2013-11-18 ENCOUNTER — Encounter: Payer: Self-pay | Admitting: Internal Medicine

## 2013-11-18 VITALS — BP 112/72 | HR 85 | Resp 10 | Ht 61.0 in | Wt 145.0 lb

## 2013-11-18 DIAGNOSIS — D539 Nutritional anemia, unspecified: Secondary | ICD-10-CM

## 2013-11-18 NOTE — Progress Notes (Signed)
Patient ID: Jody [name], female   DOB: 10/19/1940, 73 y.o.   MRN: [id number]   Location:  [facility]   Allergies  Allergen Reactions  . Shrimp [Shellfish Allergy]     All seafood    Chief Complaint  Patient presents with  . Follow-up    Discuss lab results (copy printed)     HPI: Patient is a 73 y.o. white female seen in the office today for acute visit to review her labwork--her homocysteine was done due to b12 deficiency in the past--she is on supplementation of this and iron due to her prior stomach surgery about which I have never had details.  Her sister is a Microbiologist or nutritionist and she is the one who reviews her labs/medical info.  She had asked about ferritin apparently and not homocysteine, but pt and I could not recall which lab it was so we will now add an iron panel and ferritin.  Patient is already on iron supplement also and higher doses will cause constipation problems.  Her anemia is one point lower hgb that prior labs, but her symptoms have not worsened.  She has some chronic fatigue.    Review of Systems:  Review of Systems  Constitutional: Positive for malaise/fatigue. Negative for fever and chills.  HENT: Negative for congestion.   Respiratory: Negative for shortness of breath.   Cardiovascular: Negative for chest pain.  Gastrointestinal: Negative for constipation.  Genitourinary: Negative for dysuria.  Musculoskeletal: Negative for falls and myalgias.  Skin: Negative for rash.  Neurological: Negative for dizziness and weakness.  Endo/Heme/Allergies: Does not bruise/bleed easily.  Psychiatric/Behavioral: Negative for depression and memory loss.     Past Medical History  Diagnosis Date  . Encounter for long-term (current) use of other medications   . B12 deficiency due to diet   . Anemia, unspecified   . Restless legs syndrome (RLS)   . Osteoarthrosis, unspecified whether generalized or localized, unspecified  site   . Lumbago   . Cramp of limb   . Senile osteoporosis   . Edema of both legs   . Obesity     Past Surgical History  Procedure Laterality Date  . Stomach surgery  1981  . Spine surgery  2005    Social History:   reports that she has quit smoking. She does not have any smokeless tobacco history on file. She reports that she does not drink alcohol or use illicit drugs.  Family History  Problem Relation Age of Onset  . Cancer Sister   . Cancer Brother     Medications: Patient's Medications  New Prescriptions   No medications on file  Previous Medications   ASPIRIN EC 81 MG TABLET    Take 81 mg by mouth daily.   CALCIUM CARB-CHOLECALCIFEROL (CALCIUM + D3) 600-200 MG-UNIT TABS    Take one tablet three times daily   CELEBREX 100 MG CAPSULE    Take 1 capsule (100 mg total) by mouth daily.   CHOLECALCIFEROL (VITAMIN D) 2000 UNITS TABLET    Take 2,000 Units by mouth daily.   DENOSUMAB (PROLIA) 60 MG/ML SOLN INJECTION    Inject 60 mg into the skin every 6 (six) months. Administer in upper arm, thigh, or abdomen   DIPHENHYDRAMINE-ACETAMINOPHEN (TYLENOL PM) 25-500 MG TABS    Take 1 tablet by mouth at bedtime as needed.   IRON 66 MG TABS    Take one tablet once daily   LISINOPRIL (PRINIVIL,ZESTRIL) 5 MG  TABLET    Take one tablet by mouth daily, if your blood pressure is over 140/90, take an additional 5mg  tablet   MULTIPLE VITAMIN (MULTIVITAMIN) TABLET    Take 1 tablet by mouth daily.  Modified Medications   No medications on file  Discontinued Medications   No medications on file     Physical Exam: Filed Vitals:   11/18/13 1322  BP: 112/72  Pulse: 85  Resp: 10  Height: 5\' 1"  (1.549 m)  Weight: 145 lb (65.772 kg)  SpO2: 97%  Physical Exam  Constitutional: She is oriented to person, place, and time. She appears well-developed and well-nourished. No distress.  Musculoskeletal: Normal range of motion.  Neurological: She is alert and oriented to person, place, and time.    Psychiatric: She has a normal mood and affect.    Labs reviewed: Basic Metabolic Panel:  Recent Labs  03/04/13 0842 11/15/13 0853  NA 140 141  K 4.3 4.9  CL 101 103  CO2 22 23  GLUCOSE 68 89  BUN 17 29*  CREATININE 0.96 0.99  CALCIUM 9.9 9.9   Liver Function Tests:  Recent Labs  03/04/13 0842 11/15/13 0853  AST 38 36  ALT 27 26  ALKPHOS 47 50  BILITOT 0.4 0.3  PROT 7.1 6.8  CBC:  Recent Labs  03/04/13 0842 11/15/13 0853  WBC 6.0 6.0  NEUTROABS 3.4 3.0  HGB 11.4 10.4*  HCT 34.6 31.8*  MCV 94 94  PLT  --  316   Lipid Panel:  Recent Labs  03/04/13 0842 11/15/13 0853  HDL 59 52  LDLCALC 88 82  TRIG 95 97  CHOLHDL 2.8 2.9   Lab Results  Component Value Date   HGBA1C 5.6 03/04/2013   Assessment/Plan 1. Anemia associated with nutritional deficiency, unspecified nutritional anemia type -has been said to have had b12 and iron deficiency anemia from prior "stomach surgery" but I do not have any records of details on what procedure was done  -b12 was mildly low one previous occasion but normal at last check with supplementation -also continues on iron supplement daily--would favor avoiding increasing this unless absolutely necessary due to high probability of constipation from it -hgb over 10 and fairly normal for someone in her age as some anemia also occurs as part of normal aging  Labs/tests ordered:  Added on ferritin and iron panel Next appt:  As scheduled

## 2013-11-19 LAB — SPECIMEN STATUS REPORT

## 2013-11-19 LAB — IRON AND TIBC
Iron Saturation: 21 % (ref 15–55)
Iron: 67 ug/dL (ref 35–155)
TIBC: 315 ug/dL (ref 250–450)
UIBC: 248 ug/dL (ref 150–375)

## 2013-11-19 LAB — FERRITIN: Ferritin: 191 ng/mL — ABNORMAL HIGH (ref 15–150)

## 2013-11-20 ENCOUNTER — Encounter: Payer: Self-pay | Admitting: *Deleted

## 2013-11-20 DIAGNOSIS — H2510 Age-related nuclear cataract, unspecified eye: Secondary | ICD-10-CM | POA: Diagnosis not present

## 2014-01-27 ENCOUNTER — Telehealth: Payer: Self-pay | Admitting: *Deleted

## 2014-01-27 DIAGNOSIS — D239 Other benign neoplasm of skin, unspecified: Secondary | ICD-10-CM | POA: Diagnosis not present

## 2014-01-27 DIAGNOSIS — L570 Actinic keratosis: Secondary | ICD-10-CM | POA: Diagnosis not present

## 2014-01-27 NOTE — Telephone Encounter (Signed)
Jody [name] called and wanted the information for the Cologuard, stated that she wanted to do it. I confirmed with Dr. Mariea Clonts and she agreed. Filled out paperwork and Jody [name] will come by and sign it and  Then it will be faxed to Cologuard.

## 2014-02-14 ENCOUNTER — Telehealth: Payer: Self-pay | Admitting: *Deleted

## 2014-02-14 NOTE — Telephone Encounter (Signed)
Patient said she was traveling and will be back on Feb 21, 2014 and will complete cologuard process then.

## 2014-02-16 DIAGNOSIS — Z1212 Encounter for screening for malignant neoplasm of rectum: Secondary | ICD-10-CM | POA: Diagnosis not present

## 2014-02-16 DIAGNOSIS — Z1211 Encounter for screening for malignant neoplasm of colon: Secondary | ICD-10-CM | POA: Diagnosis not present

## 2014-02-16 LAB — COLOGUARD

## 2014-02-16 LAB — FECAL OCCULT BLOOD, GUAIAC: Fecal Occult Blood: POSITIVE

## 2014-02-18 DIAGNOSIS — H2511 Age-related nuclear cataract, right eye: Secondary | ICD-10-CM | POA: Diagnosis not present

## 2014-02-18 DIAGNOSIS — H18411 Arcus senilis, right eye: Secondary | ICD-10-CM | POA: Diagnosis not present

## 2014-02-18 DIAGNOSIS — H25011 Cortical age-related cataract, right eye: Secondary | ICD-10-CM | POA: Diagnosis not present

## 2014-02-18 DIAGNOSIS — H02839 Dermatochalasis of unspecified eye, unspecified eyelid: Secondary | ICD-10-CM | POA: Diagnosis not present

## 2014-02-25 ENCOUNTER — Telehealth: Payer: Self-pay | Admitting: *Deleted

## 2014-02-25 DIAGNOSIS — Z1211 Encounter for screening for malignant neoplasm of colon: Secondary | ICD-10-CM

## 2014-02-25 NOTE — Telephone Encounter (Signed)
Received Cologuard Results and it came back Positive. Placed Results in Dr. Serafina Mitchell to review and sign.

## 2014-02-26 DIAGNOSIS — L570 Actinic keratosis: Secondary | ICD-10-CM | POA: Diagnosis not present

## 2014-02-27 NOTE — Telephone Encounter (Signed)
Per Dr. Reed---Due to the positive result, I suggest she go ahead and get a colonoscopy due to the risks of Cologuard being Positive. Patient Notified and Order placed for Colonoscopy. Patient stated that she has seen Dr. Amedeo Plenty in the past.

## 2014-03-10 ENCOUNTER — Encounter: Payer: Self-pay | Admitting: Internal Medicine

## 2014-04-16 DIAGNOSIS — Z1211 Encounter for screening for malignant neoplasm of colon: Secondary | ICD-10-CM | POA: Diagnosis not present

## 2014-04-16 DIAGNOSIS — K5730 Diverticulosis of large intestine without perforation or abscess without bleeding: Secondary | ICD-10-CM | POA: Diagnosis not present

## 2014-04-16 LAB — HM COLONOSCOPY

## 2014-04-17 ENCOUNTER — Encounter: Payer: Self-pay | Admitting: *Deleted

## 2014-04-28 ENCOUNTER — Encounter: Payer: Self-pay | Admitting: Nurse Practitioner

## 2014-04-28 ENCOUNTER — Ambulatory Visit (INDEPENDENT_AMBULATORY_CARE_PROVIDER_SITE_OTHER): Payer: Medicare Other | Admitting: Nurse Practitioner

## 2014-04-28 VITALS — BP 110/70 | HR 63 | Temp 97.9°F | Resp 18 | Ht 61.0 in | Wt 140.0 lb

## 2014-04-28 DIAGNOSIS — L03012 Cellulitis of left finger: Secondary | ICD-10-CM

## 2014-04-28 HISTORY — PX: CATARACT EXTRACTION W/ INTRAOCULAR LENS  IMPLANT, BILATERAL: SHX1307

## 2014-04-28 MED ORDER — DOXYCYCLINE HYCLATE 100 MG PO TABS: 100.0000 mg | ORAL_TABLET | Freq: Two times a day (BID) | ORAL | Status: DC

## 2014-04-28 NOTE — Progress Notes (Signed)
Patient ID: Jody [name], female   DOB: 14-Aug-1940, 74 y.o.   MRN: [id number]    PCP: Hollace Kinnier, DO  Allergies  Allergen Reactions  . Shrimp [Shellfish Allergy]     All seafood    Chief Complaint  Patient presents with  . Acute Visit    left hand , middle finger infected,     HPI: Patient is a 74 y.o. female pt of Dr Mariea Clonts, seen in the office today due to sore middle finger that stated at least a week ago. Was supposed to have cataract surgery today but was unable to due to the infection per eye MD. Has been using peroxide and ointment without any improvement. Finger is throbbing, some yellow drainage. No fever. Unsure how it started. Maybe due to cracking of skin due to being dry.  Hands painful at baseline, Arthritis limites ROM and causes joints to be tender.  Review of Systems:  Review of Systems  Constitutional: Negative for fever, activity change, appetite change and fatigue.  Skin: Positive for color change.       Painful left 3rd finger for over a week, see HPI    Past Medical History  Diagnosis Date  . Encounter for long-term (current) use of other medications   . B12 deficiency due to diet   . Anemia, unspecified   . Restless legs syndrome (RLS)   . Osteoarthrosis, unspecified whether generalized or localized, unspecified site   . Lumbago   . Cramp of limb   . Senile osteoporosis   . Edema of both legs   . Obesity    Past Surgical History  Procedure Laterality Date  . Stomach surgery  1981  . Spine surgery  2005   Social History:   reports that she has quit smoking. She does not have any smokeless tobacco history on file. She reports that she does not drink alcohol or use illicit drugs.  Family History  Problem Relation Age of Onset  . Cancer Sister   . Cancer Brother     Medications: Patient's Medications  New Prescriptions   No medications on file  Previous Medications   ASPIRIN EC 81 MG TABLET    Take 81 mg by mouth daily.   CALCIUM  CARB-CHOLECALCIFEROL (CALCIUM + D3) 600-200 MG-UNIT TABS    Take one tablet three times daily   CELEBREX 100 MG CAPSULE    Take 1 capsule (100 mg total) by mouth daily.   CHOLECALCIFEROL (VITAMIN D) 2000 UNITS TABLET    Take 2,000 Units by mouth daily.   DIPHENHYDRAMINE-ACETAMINOPHEN (TYLENOL PM) 25-500 MG TABS    Take 1 tablet by mouth at bedtime as needed.   IRON 66 MG TABS    Take one tablet once daily   LISINOPRIL (PRINIVIL,ZESTRIL) 5 MG TABLET    Take one tablet by mouth daily, if your blood pressure is over 140/90, take an additional 5mg  tablet   MULTIPLE VITAMIN (MULTIVITAMIN) TABLET    Take 1 tablet by mouth daily.  Modified Medications   No medications on file  Discontinued Medications   DENOSUMAB (PROLIA) 60 MG/ML SOLN INJECTION    Inject 60 mg into the skin every 6 (six) months. Administer in upper arm, thigh, or abdomen     Physical Exam:  Filed Vitals:   04/28/14 1316  BP: 110/70  Pulse: 63  Temp: 97.9 F (36.6 C)  TempSrc: Oral  Resp: 18  Height: 5\' 1"  (1.549 m)  Weight: 140 lb (63.504 kg)  SpO2: 99%  Physical Exam  Constitutional: She appears well-developed and well-nourished.  Skin: Skin is warm and dry. There is erythema (to left 3rd nail bed, nail with small amout of yellow dried drainage. tender nail bed, limited ROM but this is not new due to arthritis ).  Psychiatric: She has a normal mood and affect.    Labs reviewed: Basic Metabolic Panel:  Recent Labs  11/15/13 0853  NA 141  K 4.9  CL 103  CO2 23  GLUCOSE 89  BUN 29*  CREATININE 0.99  CALCIUM 9.9   Liver Function Tests:  Recent Labs  11/15/13 0853  AST 36  ALT 26  ALKPHOS 50  BILITOT 0.3  PROT 6.8   No results for input(s): LIPASE, AMYLASE in the last 8760 hours. No results for input(s): AMMONIA in the last 8760 hours. CBC:  Recent Labs  11/15/13 0853  WBC 6.0  NEUTROABS 3.0  HGB 10.4*  HCT 31.8*  MCV 94  PLT 316   Lipid Panel:  Recent Labs  11/15/13 0853  HDL  52  LDLCALC 82  TRIG 97  CHOLHDL 2.9   TSH: No results for input(s): TSH in the last 8760 hours. A1C: Lab Results  Component Value Date   HGBA1C 5.6 03/04/2013     Assessment/Plan 1. Infection of nail bed of finger of left hand -epsom salt soaks 3 times daily for 20-30 mins  - doxycycline (VIBRA-TABS) 100 MG tablet; Take 1 tablet (100 mg total) by mouth 2 (two) times daily.  Dispense: 20 tablet; Refill: 0 -follow up in 10 days, sooner if worsening redness, tenderness, drainage or if fever occurs. Pt agrees and understands.

## 2014-04-28 NOTE — Patient Instructions (Signed)
episom salt soaks 3 times daily for 20-30 mins Doxycyline 100 mg by mouth twice daily for 10 days Follow up in ~10 days   Infected Ingrown Nail  An infected ingrown nail occurs when the nail edge grows into the skin and bacteria invade the area. Symptoms include pain, tenderness, swelling, and pus drainage from the edge of the nail.  HOME CARE INSTRUCTIONS   Soak your infected nail in warm water for 20-30 minutes, 2 to 3 times a day.  Take medicine as directed and finish them.  See your caregiver for follow-up care in 2-3 days if the infection is not better. SEEK MEDICAL CARE IF:  Your finger is becoming more red, swollen or painful. MAKE SURE YOU:   Understand these instructions.  Will watch your condition.  Will get help right away if you are not doing well or get worse. Document Released: 04/21/2004 Document Revised: 06/06/2011 Document Reviewed: 03/10/2008 [facility] Patient Information 2015 [place], [place]. This information is not intended to replace advice given to you by your health care provider. Make sure you discuss any questions you have with your health care provider.

## 2014-05-08 ENCOUNTER — Ambulatory Visit (INDEPENDENT_AMBULATORY_CARE_PROVIDER_SITE_OTHER): Payer: Medicare Other | Admitting: Nurse Practitioner

## 2014-05-08 ENCOUNTER — Encounter: Payer: Self-pay | Admitting: Nurse Practitioner

## 2014-05-08 VITALS — BP 116/70 | HR 60 | Temp 97.9°F | Resp 10 | Ht 61.0 in | Wt 140.6 lb

## 2014-05-08 DIAGNOSIS — L03012 Cellulitis of left finger: Secondary | ICD-10-CM

## 2014-05-08 NOTE — Progress Notes (Signed)
Patient ID: Jody [name], female   DOB: Dec 06, 1940, 74 y.o.   MRN: [id number]    PCP: Hollace Kinnier, DO  Allergies  Allergen Reactions  . Shrimp [Shellfish Allergy]     All seafood    Chief Complaint  Patient presents with  . Follow-up    Patient here today to follow-up on infected finger (middle finger, left hand), last OV 04/28/14.  Finger is better.     HPI: Patient is a 74 y.o. female pt of Dr Mariea Clonts, seen in the office today who presents for follow up of infected left 3rd finger.  She has completed a 10 day course of doxycycline.  Today she reports less pain and thinks that the finger looks better.  She has also soaked it in Epsom salts daily as instructed.  Denies fever, chills.   Review of Systems:  Review of Systems  Constitutional: Negative for fever, chills and fatigue.  Respiratory: Negative for cough and shortness of breath.   Cardiovascular: Negative for chest pain, palpitations and leg swelling.  Skin: Positive for color change.       Left 3rd finger resolving infection     Past Medical History  Diagnosis Date  . Encounter for long-term (current) use of other medications   . B12 deficiency due to diet   . Anemia, unspecified   . Restless legs syndrome (RLS)   . Osteoarthrosis, unspecified whether generalized or localized, unspecified site   . Lumbago   . Cramp of limb   . Senile osteoporosis   . Edema of both legs   . Obesity    Past Surgical History  Procedure Laterality Date  . Stomach surgery  1981  . Spine surgery  2005   Social History:   reports that she has quit smoking. She does not have any smokeless tobacco history on file. She reports that she does not drink alcohol or use illicit drugs.  Family History  Problem Relation Age of Onset  . Cancer Sister   . Cancer Brother     Medications: Patient's Medications  New Prescriptions   No medications on file  Previous Medications   ASPIRIN EC 81 MG TABLET    Take 81 mg by mouth daily.   CALCIUM CARB-CHOLECALCIFEROL (CALCIUM + D3) 600-200 MG-UNIT TABS    Take one tablet three times daily   CELEBREX 100 MG CAPSULE    Take 1 capsule (100 mg total) by mouth daily.   CHOLECALCIFEROL (VITAMIN D) 2000 UNITS TABLET    Take 2,000 Units by mouth daily.   DIPHENHYDRAMINE-ACETAMINOPHEN (TYLENOL PM) 25-500 MG TABS    Take 1 tablet by mouth at bedtime as needed.   IRON 66 MG TABS    Take one tablet once daily   LISINOPRIL (PRINIVIL,ZESTRIL) 5 MG TABLET    Take one tablet by mouth daily, if your blood pressure is over 140/90, take an additional 5mg  tablet   MULTIPLE VITAMIN (MULTIVITAMIN) TABLET    Take 1 tablet by mouth daily.  Modified Medications   No medications on file  Discontinued Medications   DOXYCYCLINE (VIBRA-TABS) 100 MG TABLET    Take 1 tablet (100 mg total) by mouth 2 (two) times daily.     Physical Exam:  Filed Vitals:   05/08/14 1443  BP: 116/70  Pulse: 60  Temp: 97.9 F (36.6 C)  TempSrc: Oral  Resp: 10  Height: 5\' 1"  (1.549 m)  Weight: 140 lb 9.6 oz (63.776 kg)  SpO2: 99%    Physical Exam  Constitutional: She is oriented to person, place, and time. She appears well-developed and well-nourished.  Cardiovascular: Normal rate, regular rhythm and normal heart sounds.   Pulmonary/Chest: Effort normal and breath sounds normal.  Neurological: She is alert and oriented to person, place, and time.  Skin: Skin is warm and dry. There is erythema (Left 3rd finger is erythematous, no drainage, nail fell off last night ).  Psychiatric: She has a normal mood and affect.    Labs reviewed: Basic Metabolic Panel:  Recent Labs  11/15/13 0853  NA 141  K 4.9  CL 103  CO2 23  GLUCOSE 89  BUN 29*  CREATININE 0.99  CALCIUM 9.9   Liver Function Tests:  Recent Labs  11/15/13 0853  AST 36  ALT 26  ALKPHOS 50  BILITOT 0.3  PROT 6.8   No results for input(s): LIPASE, AMYLASE in the last 8760 hours. No results for input(s): AMMONIA in the last 8760  hours. CBC:  Recent Labs  11/15/13 0853  WBC 6.0  NEUTROABS 3.0  HGB 10.4*  HCT 31.8*  MCV 94  PLT 316   Lipid Panel:  Recent Labs  11/15/13 0853  HDL 52  LDLCALC 82  TRIG 97  CHOLHDL 2.9   TSH: No results for input(s): TSH in the last 8760 hours. A1C: Lab Results  Component Value Date   HGBA1C 5.6 03/04/2013     Assessment/Plan 1. Infection of nail bed of finger of left hand Resolving infection of the left 3rd finger.  Continue epsom salt soaks may use topical antibiotic ointment as needed.  Keep follow up next week with Dr Mariea Clonts. Aware of return precautions.

## 2014-05-16 ENCOUNTER — Encounter: Payer: Self-pay | Admitting: Internal Medicine

## 2014-05-16 ENCOUNTER — Ambulatory Visit (INDEPENDENT_AMBULATORY_CARE_PROVIDER_SITE_OTHER): Payer: Medicare Other | Admitting: Internal Medicine

## 2014-05-16 VITALS — BP 122/70 | HR 87 | Temp 97.5°F | Resp 18 | Ht 61.0 in | Wt 142.6 lb

## 2014-05-16 DIAGNOSIS — M810 Age-related osteoporosis without current pathological fracture: Secondary | ICD-10-CM | POA: Diagnosis not present

## 2014-05-16 DIAGNOSIS — M150 Primary generalized (osteo)arthritis: Secondary | ICD-10-CM

## 2014-05-16 DIAGNOSIS — H25019 Cortical age-related cataract, unspecified eye: Secondary | ICD-10-CM | POA: Insufficient documentation

## 2014-05-16 DIAGNOSIS — Z23 Encounter for immunization: Secondary | ICD-10-CM | POA: Diagnosis not present

## 2014-05-16 DIAGNOSIS — D539 Nutritional anemia, unspecified: Secondary | ICD-10-CM

## 2014-05-16 DIAGNOSIS — I10 Essential (primary) hypertension: Secondary | ICD-10-CM | POA: Diagnosis not present

## 2014-05-16 DIAGNOSIS — M8949 Other hypertrophic osteoarthropathy, multiple sites: Secondary | ICD-10-CM

## 2014-05-16 DIAGNOSIS — M159 Polyosteoarthritis, unspecified: Secondary | ICD-10-CM

## 2014-05-16 DIAGNOSIS — D649 Anemia, unspecified: Secondary | ICD-10-CM | POA: Insufficient documentation

## 2014-05-16 DIAGNOSIS — H25013 Cortical age-related cataract, bilateral: Secondary | ICD-10-CM

## 2014-05-16 NOTE — Progress Notes (Signed)
Patient ID: Jody [name], female   DOB: 11-08-1940, 74 y.o.   MRN: [id number]   Location:  [facility]   Allergies  Allergen Reactions  . Shrimp [Shellfish Allergy]     All seafood    Chief Complaint  Patient presents with  . Medical Management of Chronic Issues    HPI: Patient is a 74 y.o. seen in the office today for med mgt of chronic diseases.  Stopped her prolia shot.  Needs her bone density next month.  She is going to call and schedule that.  Says she is losing inches.    Keeping her bp 125-135.  Had been just doing 5mg  at night of lisinopril, but not at goal all of the time. Takes 10mg  sometimes.  If takes 10mg  too long in a row, ends up dizzy.  Takes  BP at night.    Had infected finger and Jody [name] saw her.  Improved and has eye surgery Monday.  Lost her nail and all.    Is feeling good.  Has lost 24 lbs since 2014.  Says she [place] [employer] sometimes.    Does wake up frequently at night.  Uses tylenol pm nightly.  Discussed risks.  Review of Systems:  Review of Systems  Constitutional: Positive for weight loss. Negative for fever and chills.  HENT: Negative for congestion and hearing loss.   Eyes: Positive for blurred vision.  Respiratory: Negative for shortness of breath.   Cardiovascular: Positive for leg swelling. Negative for chest pain.  Gastrointestinal: Negative for abdominal pain and constipation.  Genitourinary: Negative for dysuria, urgency and frequency.  Musculoskeletal: Negative for falls.  Neurological: Negative for dizziness and loss of consciousness.  Endo/Heme/Allergies: Bruises/bleeds easily.  Psychiatric/Behavioral: Negative for depression and memory loss.    Past Medical History  Diagnosis Date  . Encounter for long-term (current) use of other medications   . B12 deficiency due to diet   . Anemia, unspecified   . Restless legs syndrome (RLS)   . Osteoarthrosis, unspecified whether  generalized or localized, unspecified site   . Lumbago   . Cramp of limb   . Senile osteoporosis   . Edema of both legs   . Obesity     Past Surgical History  Procedure Laterality Date  . Stomach surgery  1981  . Spine surgery  2005    Social History:   reports that she has quit smoking. She does not have any smokeless tobacco history on file. She reports that she does not drink alcohol or use illicit drugs.  Family History  Problem Relation Age of Onset  . Cancer Sister   . Cancer Brother     Medications: Patient's Medications  New Prescriptions   No medications on file  Previous Medications   ASPIRIN EC 81 MG TABLET    Take 81 mg by mouth daily.   CALCIUM CARB-CHOLECALCIFEROL (CALCIUM + D3) 600-200 MG-UNIT TABS    Take one tablet three times daily   CELEBREX 100 MG CAPSULE    Take 1 capsule (100 mg total) by mouth daily.   CHOLECALCIFEROL (VITAMIN D) 2000 UNITS TABLET    Take 2,000 Units by mouth daily.   DIPHENHYDRAMINE-ACETAMINOPHEN (TYLENOL PM) 25-500 MG TABS    Take 1 tablet by mouth at bedtime as needed.   IRON 66 MG TABS    Take one tablet once daily   LISINOPRIL (PRINIVIL,ZESTRIL) 5 MG TABLET    Take one tablet by mouth  daily, if your blood pressure is over 140/90, take an additional 5mg  tablet   MULTIPLE VITAMIN (MULTIVITAMIN) TABLET    Take 1 tablet by mouth daily.  Modified Medications   No medications on file  Discontinued Medications   No medications on file     Physical Exam: Filed Vitals:   05/16/14 1021  BP: 122/70  Pulse: 87  Temp: 97.5 F (36.4 C)  TempSrc: Oral  Resp: 18  Height: 5\' 1"  (1.549 m)  Weight: 142 lb 9.6 oz (64.683 kg)  SpO2: 95%  Physical Exam  Constitutional: She is oriented to person, place, and time. She appears well-developed and well-nourished. No distress.  Cardiovascular: Normal rate, regular rhythm, normal heart sounds and intact distal pulses.   No edema today  Pulmonary/Chest: Effort normal and breath sounds normal.    Abdominal: Soft. Bowel sounds are normal. She exhibits no distension and no mass. There is no tenderness.  Musculoskeletal: Normal range of motion.  Neurological: She is alert and oriented to person, place, and time. She has normal reflexes.  Skin: Skin is warm and dry.  Psychiatric: She has a normal mood and affect.    Labs reviewed: Basic Metabolic Panel:  Recent Labs  11/15/13 0853  NA 141  K 4.9  CL 103  CO2 23  GLUCOSE 89  BUN 29*  CREATININE 0.99  CALCIUM 9.9   Liver Function Tests:  Recent Labs  11/15/13 0853  AST 36  ALT 26  ALKPHOS 50  BILITOT 0.3  PROT 6.8   No results for input(s): LIPASE, AMYLASE in the last 8760 hours. No results for input(s): AMMONIA in the last 8760 hours. CBC:  Recent Labs  11/15/13 0853  WBC 6.0  NEUTROABS 3.0  HGB 10.4*  HCT 31.8*  MCV 94  PLT 316   Lipid Panel:  Recent Labs  11/15/13 0853  HDL 52  LDLCALC 82  TRIG 97  CHOLHDL 2.9   Lab Results  Component Value Date   HGBA1C 5.6 03/04/2013   Assessment/Plan 1. Senile osteoporosis -bone density next month -had chest pain with prolia before -cannot take oral bisphosponates (previously failed them with continued decline) -will try zoledronic acid if density continued to decline  With ca with D and additional D and weightbearing exercise (probably did considering her loss of height)  2. Essential hypertension, benign -bp at goal with lisinopril 5mg  or 10mg  some days--approach seems to be working for her and 10mg  all of the time makes her dizzy  3. Anemia associated with nutritional deficiency, unspecified nutritional anemia type -cont iron supplement, will f/u labs at next visit  4. Primary osteoarthritis involving multiple joints -stable, uses celebrex longstanding  5. Cataract cortical, senile, bilateral -is having cataract surgery next week for her first eye  6. Need for prophylactic vaccination and inoculation against influenza -flu shot  given   Labs/tests ordered:  Will order at 6 mo visit Next appt:  6 mos  Jody [name] L. Chani Ghanem, D.O. [facility] [address], [place] [address] Cell Phone (Mon-Fri 8am-5pm):  [phone] On Call:  [phone] & follow prompts after 5pm & weekends Office Phone:  [phone] Office Fax:  [phone]

## 2014-05-19 DIAGNOSIS — H2512 Age-related nuclear cataract, left eye: Secondary | ICD-10-CM | POA: Diagnosis not present

## 2014-05-19 DIAGNOSIS — H25812 Combined forms of age-related cataract, left eye: Secondary | ICD-10-CM | POA: Diagnosis not present

## 2014-05-20 DIAGNOSIS — H2511 Age-related nuclear cataract, right eye: Secondary | ICD-10-CM | POA: Diagnosis not present

## 2014-05-21 ENCOUNTER — Other Ambulatory Visit: Payer: Self-pay | Admitting: *Deleted

## 2014-06-02 DIAGNOSIS — H5212 Myopia, left eye: Secondary | ICD-10-CM | POA: Diagnosis not present

## 2014-06-02 DIAGNOSIS — H25811 Combined forms of age-related cataract, right eye: Secondary | ICD-10-CM | POA: Diagnosis not present

## 2014-06-02 DIAGNOSIS — H52223 Regular astigmatism, bilateral: Secondary | ICD-10-CM | POA: Diagnosis not present

## 2014-06-02 DIAGNOSIS — H2511 Age-related nuclear cataract, right eye: Secondary | ICD-10-CM | POA: Diagnosis not present

## 2014-06-20 DIAGNOSIS — M810 Age-related osteoporosis without current pathological fracture: Secondary | ICD-10-CM | POA: Diagnosis not present

## 2014-06-20 DIAGNOSIS — Z1231 Encounter for screening mammogram for malignant neoplasm of breast: Secondary | ICD-10-CM | POA: Diagnosis not present

## 2014-06-20 LAB — HM MAMMOGRAPHY: HM Mammogram: NORMAL

## 2014-07-09 ENCOUNTER — Telehealth: Payer: Self-pay | Admitting: Internal Medicine

## 2014-07-09 NOTE — Telephone Encounter (Signed)
Jody [name] dropped off a form from [employer] to be filled out by Dr. Mariea Clonts. The form was placed in the rx tray.

## 2014-07-10 NOTE — Telephone Encounter (Signed)
Patient aware form is ready for pick up.

## 2014-07-10 NOTE — Telephone Encounter (Signed)
I called patient to clarify form. Form is for insurance.  If patient is more than 150 miles away form home and needs medical attention patient will be evacuated to hospital of her choice. Patient plans to travel to [place], [place] and [place] within the next 6-12 months.   Form filled in to the best of my knowledge and based on information provided by patient and medical record.   Form placed on ledge for Dr.Reed to complete, only 1 blank to fill in, signature and date.  Patient would like a call when form is completed to pick-up.

## 2014-07-10 NOTE — Telephone Encounter (Signed)
Form was completed and signed.

## 2014-08-11 DIAGNOSIS — J189 Pneumonia, unspecified organism: Secondary | ICD-10-CM | POA: Diagnosis not present

## 2014-09-03 DIAGNOSIS — D485 Neoplasm of uncertain behavior of skin: Secondary | ICD-10-CM | POA: Diagnosis not present

## 2014-09-03 DIAGNOSIS — L570 Actinic keratosis: Secondary | ICD-10-CM | POA: Diagnosis not present

## 2014-09-03 DIAGNOSIS — L281 Prurigo nodularis: Secondary | ICD-10-CM | POA: Diagnosis not present

## 2014-09-03 DIAGNOSIS — Z85820 Personal history of malignant melanoma of skin: Secondary | ICD-10-CM | POA: Diagnosis not present

## 2014-09-22 ENCOUNTER — Other Ambulatory Visit: Payer: Self-pay

## 2014-10-01 ENCOUNTER — Other Ambulatory Visit: Payer: Self-pay | Admitting: Internal Medicine

## 2014-10-05 ENCOUNTER — Other Ambulatory Visit: Payer: Self-pay | Admitting: Internal Medicine

## 2014-10-08 ENCOUNTER — Other Ambulatory Visit: Payer: Self-pay | Admitting: *Deleted

## 2014-10-08 MED ORDER — CELECOXIB 100 MG PO CAPS: ORAL_CAPSULE | ORAL | Status: DC

## 2014-10-08 NOTE — Telephone Encounter (Signed)
[facility]

## 2014-11-14 ENCOUNTER — Encounter: Payer: Self-pay | Admitting: Internal Medicine

## 2014-11-14 ENCOUNTER — Ambulatory Visit (INDEPENDENT_AMBULATORY_CARE_PROVIDER_SITE_OTHER): Payer: Medicare Other | Admitting: Internal Medicine

## 2014-11-14 VITALS — BP 120/80 | HR 73 | Temp 97.5°F | Resp 20 | Ht 61.0 in | Wt 152.6 lb

## 2014-11-14 DIAGNOSIS — R252 Cramp and spasm: Secondary | ICD-10-CM | POA: Diagnosis not present

## 2014-11-14 DIAGNOSIS — Z13220 Encounter for screening for lipoid disorders: Secondary | ICD-10-CM

## 2014-11-14 DIAGNOSIS — M150 Primary generalized (osteo)arthritis: Secondary | ICD-10-CM | POA: Diagnosis not present

## 2014-11-14 DIAGNOSIS — D539 Nutritional anemia, unspecified: Secondary | ICD-10-CM | POA: Diagnosis not present

## 2014-11-14 DIAGNOSIS — Z23 Encounter for immunization: Secondary | ICD-10-CM | POA: Diagnosis not present

## 2014-11-14 DIAGNOSIS — I10 Essential (primary) hypertension: Secondary | ICD-10-CM

## 2014-11-14 DIAGNOSIS — M8949 Other hypertrophic osteoarthropathy, multiple sites: Secondary | ICD-10-CM

## 2014-11-14 DIAGNOSIS — M810 Age-related osteoporosis without current pathological fracture: Secondary | ICD-10-CM

## 2014-11-14 DIAGNOSIS — M159 Polyosteoarthritis, unspecified: Secondary | ICD-10-CM

## 2014-11-14 MED ORDER — CELECOXIB 100 MG PO CAPS: 100.0000 mg | ORAL_CAPSULE | Freq: Every day | ORAL | Status: DC

## 2014-11-14 MED ORDER — LISINOPRIL 5 MG PO TABS: ORAL_TABLET | ORAL | Status: DC

## 2014-11-14 NOTE — Progress Notes (Signed)
Patient ID: Jody [name], female   DOB: 07-Feb-1941, 74 y.o.   MRN: [id number]   Location:  [facility]  Code Status: DNR Goals of Care: Advanced Directive information Does patient have an advance directive?: Yes, Type of Advance Directive: [place];Living will, Does patient want to make changes to advanced directive?: No - Patient declined   Chief Complaint  Patient presents with  . Medical Management of Chronic Issues    HPI: Patient is a 74 y.o. white female seen in the office today for medical mgt of chronic diseases.    She is still having leg cramps.  Had been at night and they have moved into the daytime lately.  Has even happened when driving.  Can't do anything when they happen.  Has happened in the swimming pool.  Started to take magnesium and potassium over the counter.  They have not been as severe since.  Doesn't get every night.    Wants BRAND celebrex for her OA.    BP wonderful today.  She monitors it closely at home.  Does need her lisinopril refilled.  Has only needed 5mg  daily since June.  Sometimes got dizzy if she did take the extra 5mg  when initial bp over 140/90.  Had bone density in Feb.  Took fosamax for years.  Had chest pain after prolia on two occasions so stopped it.  Previously did forteo.  Currently doing ca and exercise only.  Hips are -1.7 and -1.8, but wrist is -4.2.    Cologuard was positive in 11/15.  Seems to always have blood loss, but none can ever be detected.  Had cscope in 04/16/14 which showed multiple diverticula in the sigmoid colon and in the descending colon.    Review of Systems:  Review of Systems  Constitutional: Negative for fever, chills and malaise/fatigue.  HENT: Negative for congestion and hearing loss.   Eyes: Negative for blurred vision.  Respiratory: Negative for shortness of breath.   Cardiovascular: Positive for leg swelling. Negative for chest pain and palpitations.   Gastrointestinal: Positive for constipation. Negative for abdominal pain, blood in stool and melena.       Loose stools  Genitourinary: Negative for dysuria.  Musculoskeletal: Negative for falls.       Leg cramps  Skin: Negative for itching and rash.  Neurological: Negative for dizziness, loss of consciousness and weakness.  Endo/Heme/Allergies: Bruises/bleeds easily.  Psychiatric/Behavioral: Negative for depression and memory loss.    Past Medical History  Diagnosis Date  . Encounter for long-term (current) use of other medications   . B12 deficiency due to diet   . Anemia, unspecified   . Restless legs syndrome (RLS)   . Osteoarthrosis, unspecified whether generalized or localized, unspecified site   . Lumbago   . Cramp of limb   . Senile osteoporosis   . Edema of both legs   . Obesity     Past Surgical History  Procedure Laterality Date  . Stomach surgery  1981  . Spine surgery  2005    Allergies  Allergen Reactions  . Prolia [Denosumab] Other (See Comments)    Chest pain  . Shrimp [Shellfish Allergy]     All seafood   Medications: Patient's Medications  New Prescriptions   CELECOXIB (CELEBREX) 100 MG CAPSULE    Take 1 capsule (100 mg total) by mouth daily.  Previous Medications   CALCIUM CARB-CHOLECALCIFEROL (CALCIUM + D3) 600-200 MG-UNIT TABS    Take  one tablet three times daily   CHOLECALCIFEROL (VITAMIN D) 2000 UNITS TABLET    Take 2,000 Units by mouth daily.   DIPHENHYDRAMINE-ACETAMINOPHEN (TYLENOL PM) 25-500 MG TABS    Take 1 tablet by mouth at bedtime as needed.   IRON 66 MG TABS    Take one tablet once daily   MAGNESIUM CARBONATE PO    Take 1 capsule by mouth daily.   MULTIPLE VITAMIN (MULTIVITAMIN) TABLET    Take 1 tablet by mouth daily.   POTASSIUM CHLORIDE PO    Take 1 tablet by mouth daily.  Modified Medications   Modified Medication Previous Medication   LISINOPRIL (PRINIVIL,ZESTRIL) 5 MG TABLET lisinopril (PRINIVIL,ZESTRIL) 5 MG tablet      TAKE  1 TABLET DAILY. IF YOUR BLOOD PRESSURE IS OVER 140/90, TAKE AN ADDITIONAL 5 MG TABLET.    TAKE 1 TABLET DAILY. IF YOUR BLOOD PRESSURE IS OVER 140/90, TAKE AN ADDITIONAL 5 MG TABLET.  Discontinued Medications   ASPIRIN EC 81 MG TABLET    Take 81 mg by mouth daily.   CELECOXIB (CELEBREX) 100 MG CAPSULE    Take one capsule by mouth once daily for pains    Physical Exam: Filed Vitals:   11/14/14 0817  BP: 120/80  Pulse: 73  Temp: 97.5 F (36.4 C)  TempSrc: Oral  Resp: 20  Height: 5\' 1"  (1.549 m)  Weight: 152 lb 9.6 oz (69.219 kg)  SpO2: 98%   Physical Exam  Constitutional: She is oriented to person, place, and time. She appears well-developed and well-nourished. No distress.  Cardiovascular: Normal rate, regular rhythm, normal heart sounds and intact distal pulses.   Occasional pvc  Pulmonary/Chest: Effort normal and breath sounds normal. No respiratory distress. She has no wheezes. She has no rales.  Abdominal: Soft. Bowel sounds are normal.  Musculoskeletal: Normal range of motion. She exhibits no edema or tenderness.  Neurological: She is alert and oriented to person, place, and time.  Skin: Skin is warm and dry.  Psychiatric: She has a normal mood and affect.    Labs reviewed: Basic Metabolic Panel:  Recent Labs  11/15/13 0853  NA 141  K 4.9  CL 103  CO2 23  GLUCOSE 89  BUN 29*  CREATININE 0.99  CALCIUM 9.9   Liver Function Tests:  Recent Labs  11/15/13 0853  AST 36  ALT 26  ALKPHOS 50  BILITOT 0.3  PROT 6.8   No results for input(s): LIPASE, AMYLASE in the last 8760 hours. No results for input(s): AMMONIA in the last 8760 hours. CBC:  Recent Labs  11/15/13 0853  WBC 6.0  NEUTROABS 3.0  HGB 10.4*  HCT 31.8*  MCV 94  PLT 316   Lipid Panel:  Recent Labs  11/15/13 0853  CHOL 153  HDL 52  LDLCALC 82  TRIG 97  CHOLHDL 2.9   Lab Results  Component Value Date   HGBA1C 5.6 03/04/2013    Assessment/Plan 1. Primary osteoarthritis involving  multiple joints -remains on celebrex 100mg  daily--requests BRAND only so added to pharmacy comments and [facility]  2. Essential hypertension, benign -bp at goal with current regimen and home bp checks--continue -no lightheadedness with regular dosing - lisinopril (PRINIVIL,ZESTRIL) 5 MG tablet; TAKE 1 TABLET DAILY. IF YOUR BLOOD PRESSURE IS OVER 140/90, TAKE AN ADDITIONAL 5 MG TABLET.  Dispense: 180 tablet; Refill: 3 - Basic metabolic panel - Comprehensive metabolic panel; Future  3. Bilateral leg cramps -etiology was unclear--seems a little better  since taking mag and k but need to check to make sure she doesn't take too much -also has had loose stools with Mg so may have to cut back - Basic metabolic panel - Magnesium - Magnesium; Future  4. Senile osteoporosis -cont ca with D and additional D -bone density as in hpi just done this past year -check ca and cr today to assess before zoledronic acid infusion -form completed except those values which will be filled in Monday -pt needs it done before 9/1 or after 11/1 due to beach trip between -previously been on fosamax, forteo and did not tolerate prolia (chest pain)  5. Anemia associated with nutritional deficiency, unspecified nutritional anemia type -prior "stomach stapling"--continues on her iron and b12 on her own--levels have been normal -had positive cologuard, but cscope showed only diverticulosis earlier this year so needs no more for her lifetime  - CBC with Differential/Platelet; Future  6. Lipid screening -wanted to check lipids, but insurance won't cover   7. Need for vaccination with 13-polyvalent pneumococcal conjugate vaccine -prevnar given today  Labs/tests ordered:   Orders Placed This Encounter  Procedures  . Pneumococcal conjugate vaccine 13-valent  . Basic metabolic panel  . Magnesium  . CBC with Differential/Platelet    Standing Status: Future     Number of Occurrences:       Standing Expiration Date: 11/14/2015  . Comprehensive metabolic panel    Standing Status: Future     Number of Occurrences:      Standing Expiration Date: 11/14/2015    Order Specific Question:  Has the patient fasted?    Answer:  Yes  . Magnesium    Standing Status: Future     Number of Occurrences:      Standing Expiration Date: 11/14/2015    Next appt:  6 mos for her annual exam with labs before  Benicia. Darrin Koman, D.O. [facility] [address], [place] [address] Cell Phone (Mon-Fri 8am-5pm):  [phone] On Call:  [phone] & follow prompts after 5pm & weekends Office Phone:  [phone] Office Fax:  [phone]

## 2014-11-14 NOTE — Patient Instructions (Signed)
CoQ10 sometimes helps people's cramps.    We'll set you up for your zoledronic acid infusion.

## 2014-11-15 LAB — BASIC METABOLIC PANEL
BUN/Creatinine Ratio: 23 (ref 11–26)
BUN: 26 mg/dL (ref 8–27)
CO2: 22 mmol/L (ref 18–29)
Calcium: 9.5 mg/dL (ref 8.7–10.3)
Chloride: 100 mmol/L (ref 97–108)
Creatinine, Ser: 1.14 mg/dL — ABNORMAL HIGH (ref 0.57–1.00)
GFR calc Af Amer: 54 mL/min/{1.73_m2} — ABNORMAL LOW (ref >59)
GFR calc non Af Amer: 47 mL/min/{1.73_m2} — ABNORMAL LOW (ref >59)
Glucose: 78 mg/dL (ref 65–99)
Potassium: 4.9 mmol/L (ref 3.5–5.2)
Sodium: 139 mmol/L (ref 134–144)

## 2014-11-15 LAB — MAGNESIUM: Magnesium: 2 mg/dL (ref 1.6–2.3)

## 2014-11-25 ENCOUNTER — Encounter (HOSPITAL_COMMUNITY): Payer: Self-pay

## 2014-11-25 ENCOUNTER — Ambulatory Visit (HOSPITAL_COMMUNITY)
Admission: RE | Admit: 2014-11-25 | Discharge: 2014-11-25 | Disposition: A | Discharge status: Home / Self Care | Payer: Medicare Other | Source: Ambulatory Visit | Attending: Internal Medicine | Admitting: Internal Medicine

## 2014-11-25 DIAGNOSIS — M810 Age-related osteoporosis without current pathological fracture: Secondary | ICD-10-CM | POA: Insufficient documentation

## 2014-11-25 MED ORDER — ZOLEDRONIC ACID 5 MG/100ML IV SOLN
5.0000 mg | Freq: Once | INTRAVENOUS | Status: AC
  Administered 2014-11-25: 5 mg via INTRAVENOUS
  Filled 2014-11-25: qty 100

## 2014-11-25 MED ORDER — SODIUM CHLORIDE 0.9 % IV SOLN
250.0000 mL | INTRAVENOUS | Status: AC
  Administered 2014-11-25: 250 mL via INTRAVENOUS

## 2014-11-25 NOTE — Discharge Instructions (Signed)

## 2014-11-25 NOTE — Progress Notes (Signed)
Tolerated Reclast infusion well. Stayed for 20 minute post infusion observation. No signs of adverse reaction noted. Instructed to call Dr. Cyndi Lennert Office for problems and concerns once discharged from Short Stay. Verbalized understanding.

## 2014-12-03 ENCOUNTER — Encounter: Payer: Self-pay | Admitting: Internal Medicine

## 2014-12-09 ENCOUNTER — Encounter (HOSPITAL_COMMUNITY): Payer: Medicare Other

## 2014-12-10 ENCOUNTER — Other Ambulatory Visit: Payer: Self-pay | Admitting: *Deleted

## 2014-12-10 MED ORDER — CELECOXIB 100 MG PO CAPS: 100.0000 mg | ORAL_CAPSULE | Freq: Every day | ORAL | Status: DC

## 2014-12-10 NOTE — Telephone Encounter (Signed)
[facility]

## 2014-12-11 ENCOUNTER — Other Ambulatory Visit: Payer: Self-pay

## 2014-12-11 MED ORDER — CELECOXIB 100 MG PO CAPS: 100.0000 mg | ORAL_CAPSULE | Freq: Every day | ORAL | Status: DC

## 2014-12-15 ENCOUNTER — Telehealth: Payer: Self-pay

## 2014-12-15 NOTE — Telephone Encounter (Signed)
Form was received from [facility] to initiate PA for Brand over Generic for Celebrex.   Left message on voicemail for patient to return call when available. Reason for call: patient needs to answer questions to be completed on form 1.) Has she ever tried a generic 2.) Did the patient have significant adverse reaction to generic?

## 2015-02-03 ENCOUNTER — Telehealth: Payer: Self-pay

## 2015-02-03 NOTE — Telephone Encounter (Signed)
Patient called and asked to have her medication changed from the generic celecoxib to brand name Celebrex because the generic brand is not helping her and she is willing to pay the cost. Please advise

## 2015-02-04 NOTE — Telephone Encounter (Signed)
Patient now says she thinks she needs a prior approval  from her insurance so I told her to get the form faxed to the office and one of [place] would give it to you to review.

## 2015-02-04 NOTE — Telephone Encounter (Signed)
Fine with me.  We just use brand b/c it's cheaper.  Be sure to type brand necessary in the notes to pharmacy section and select the brand when doing the order.

## 2015-02-06 NOTE — Telephone Encounter (Signed)
Form was completed today and returned to triage CMA.

## 2015-02-06 NOTE — Telephone Encounter (Signed)
Called patient to let her know that the form she sent in was signed and would be faxed to the insurance company that is on the form.

## 2015-02-06 NOTE — Telephone Encounter (Signed)
Brand over Generic Prior Authorization Request Form for Celebrex faxed to [facility] Fax#: [phone]

## 2015-02-10 NOTE — Telephone Encounter (Signed)
Received fax from [place] and Celebrex DENIED. States it does not meet [employer] criteria to establish medical necessity of the requested brand name product over generic product.

## 2015-02-17 ENCOUNTER — Telehealth: Payer: Self-pay

## 2015-02-17 NOTE — Telephone Encounter (Signed)
Jody [name] came into the office and brought a letter of denial for her Celebrex she signed the letter she had giving [place] the permission to appeal the decision made by [facility]. I  called the company and spoke with [name] from the Prior Arthorization  Department, she said a letter of Medical necessity along with her current Diagnosis, and  Treatment of why she needs this medication should be faxed to [place] for [employer] at [phone])  Please advise

## 2015-02-18 NOTE — Telephone Encounter (Signed)
Use the information I put on the previous form and type the letter.  Stamp my signature on it.

## 2015-02-24 NOTE — Telephone Encounter (Signed)
Letter typed and faxed to insurance co. [phone] )

## 2015-03-05 ENCOUNTER — Telehealth: Payer: Self-pay | Admitting: *Deleted

## 2015-03-05 NOTE — Telephone Encounter (Signed)
Received letter from [place] Stated:"We have completed our reconsideration of the denial for Celebrex, which was rejected on 10/06/14 at [place]. Unfortunately we were unable to overturn the original denial.  Letter given to Dr. Mariea Clonts to review.

## 2015-05-04 NOTE — Telephone Encounter (Signed)
This was resolved.

## 2015-05-18 ENCOUNTER — Other Ambulatory Visit: Payer: Medicare Other

## 2015-05-20 ENCOUNTER — Other Ambulatory Visit: Payer: TRICARE For Life (TFL)

## 2015-05-20 DIAGNOSIS — I10 Essential (primary) hypertension: Secondary | ICD-10-CM | POA: Diagnosis not present

## 2015-05-20 DIAGNOSIS — R252 Cramp and spasm: Secondary | ICD-10-CM

## 2015-05-20 DIAGNOSIS — D539 Nutritional anemia, unspecified: Secondary | ICD-10-CM | POA: Diagnosis not present

## 2015-05-21 LAB — CBC WITH DIFFERENTIAL/PLATELET
Basophils Absolute: 0 10*3/uL (ref 0.0–0.2)
Basos: 1 %
EOS (ABSOLUTE): 0.3 10*3/uL (ref 0.0–0.4)
Eos: 5 %
Hematocrit: 36.1 % (ref 34.0–46.6)
Hemoglobin: 11.9 g/dL (ref 11.1–15.9)
Immature Grans (Abs): 0 10*3/uL (ref 0.0–0.1)
Immature Granulocytes: 0 %
Lymphocytes Absolute: 1.8 10*3/uL (ref 0.7–3.1)
Lymphs: 32 %
MCH: 30.4 pg (ref 26.6–33.0)
MCHC: 33 g/dL (ref 31.5–35.7)
MCV: 92 fL (ref 79–97)
Monocytes Absolute: 0.5 10*3/uL (ref 0.1–0.9)
Monocytes: 9 %
Neutrophils Absolute: 3 10*3/uL (ref 1.4–7.0)
Neutrophils: 53 %
Platelets: 277 10*3/uL (ref 150–379)
RBC: 3.92 x10E6/uL (ref 3.77–5.28)
RDW: 13.3 % (ref 12.3–15.4)
WBC: 5.7 10*3/uL (ref 3.4–10.8)

## 2015-05-21 LAB — COMPREHENSIVE METABOLIC PANEL
ALT: 25 IU/L (ref 0–32)
AST: 31 IU/L (ref 0–40)
Albumin/Globulin Ratio: 1.7 (ref 1.1–2.5)
Albumin: 4.2 g/dL (ref 3.5–4.8)
Alkaline Phosphatase: 51 IU/L (ref 39–117)
BUN/Creatinine Ratio: 24 (ref 11–26)
BUN: 25 mg/dL (ref 8–27)
Bilirubin Total: 0.4 mg/dL (ref 0.0–1.2)
CO2: 24 mmol/L (ref 18–29)
Calcium: 9.3 mg/dL (ref 8.7–10.3)
Chloride: 101 mmol/L (ref 96–106)
Creatinine, Ser: 1.03 mg/dL — ABNORMAL HIGH (ref 0.57–1.00)
GFR calc Af Amer: 61 mL/min/{1.73_m2} (ref >59)
GFR calc non Af Amer: 53 mL/min/{1.73_m2} — ABNORMAL LOW (ref >59)
Globulin, Total: 2.5 g/dL (ref 1.5–4.5)
Glucose: 82 mg/dL (ref 65–99)
Potassium: 4.8 mmol/L (ref 3.5–5.2)
Sodium: 140 mmol/L (ref 134–144)
Total Protein: 6.7 g/dL (ref 6.0–8.5)

## 2015-05-21 LAB — MAGNESIUM: Magnesium: 2 mg/dL (ref 1.6–2.3)

## 2015-05-22 ENCOUNTER — Encounter: Payer: Self-pay | Admitting: Internal Medicine

## 2015-05-22 ENCOUNTER — Ambulatory Visit (INDEPENDENT_AMBULATORY_CARE_PROVIDER_SITE_OTHER): Payer: Medicare Other | Admitting: Internal Medicine

## 2015-05-22 VITALS — BP 110/70 | HR 70 | Temp 98.0°F | Ht 61.0 in | Wt 169.0 lb

## 2015-05-22 DIAGNOSIS — R252 Cramp and spasm: Secondary | ICD-10-CM

## 2015-05-22 DIAGNOSIS — M150 Primary generalized (osteo)arthritis: Secondary | ICD-10-CM | POA: Diagnosis not present

## 2015-05-22 DIAGNOSIS — M159 Polyosteoarthritis, unspecified: Secondary | ICD-10-CM

## 2015-05-22 DIAGNOSIS — E538 Deficiency of other specified B group vitamins: Secondary | ICD-10-CM | POA: Diagnosis not present

## 2015-05-22 DIAGNOSIS — M810 Age-related osteoporosis without current pathological fracture: Secondary | ICD-10-CM

## 2015-05-22 DIAGNOSIS — I10 Essential (primary) hypertension: Secondary | ICD-10-CM

## 2015-05-22 DIAGNOSIS — M8949 Other hypertrophic osteoarthropathy, multiple sites: Secondary | ICD-10-CM

## 2015-05-22 DIAGNOSIS — E663 Overweight: Secondary | ICD-10-CM | POA: Diagnosis not present

## 2015-05-22 DIAGNOSIS — D539 Nutritional anemia, unspecified: Secondary | ICD-10-CM

## 2015-05-22 DIAGNOSIS — Z0000 Encounter for general adult medical examination without abnormal findings: Secondary | ICD-10-CM

## 2015-05-22 DIAGNOSIS — M79645 Pain in left finger(s): Secondary | ICD-10-CM | POA: Diagnosis not present

## 2015-05-22 MED ORDER — COENZYME Q10 100 MG PO CAPS: 100.0000 mg | ORAL_CAPSULE | Freq: Every day | ORAL | Status: DC

## 2015-05-22 NOTE — Progress Notes (Signed)
Patient ID: Jody [name], female   DOB: 04/07/1940, 75 y.o.   MRN: [id number]  [facility] clinic Provider: Dionisio Aragones L. Mariea Clonts, D.O., C.M.D.  Patient Care Team: Gayland Curry, DO as PCP - General (Geriatric Medicine)  Extended Emergency Contact Information Primary Emergency Contact: Baldonado,Donald Address: [address]          [place] [address] Johnnette Litter of [place] Phone: [phone] Relation: Spouse  Code Status: DNR Goals of Care: Advanced Directive information Advanced Directives 11/14/2014  Does patient have an advance directive? Yes  Type of Paramedic of [place];Living will  Does patient want to make changes to advanced directive? No - Patient declined  Copy of advanced directive(s) in chart? Yes   Chief Complaint  Patient presents with  . Annual Exam    Wellness exam  . Medical Management of Chronic Issues    medication management, anemia, B12 deficiency, osteoporosis  . MMSE    30/30 passed clock drawing    HPI: Patient is a 75 y.o. female seen in today for an annual wellness exam and med mgt of chronic diseases.      Depression screen [facility] 2/9 05/22/2015 11/14/2014 04/28/2014 11/14/2013 10/01/2012  Decreased Interest 0 0 0 0 0  Down, Depressed, Hopeless 0 0 0 0 0  PHQ - 2 Score 0 0 0 0 0    Fall Risk  05/22/2015 11/14/2014 05/08/2014 04/28/2014 11/18/2013  Falls in the past year? No No No No No  Number falls in past yr: - - - - -  Injury with Fall? - - - - -  Risk for fall due to : - History of fall(s);Medication side effect - - -   MMSE - Mini Mental State Exam 05/22/2015  Orientation to time 5  Orientation to Place 5  Registration 3  Attention/ Calculation 5  Recall 3  Language- name 2 objects 2  Language- repeat 1  Language- follow 3 step command 3  Language- read & follow direction 1  Write a sentence 1  Copy design 1  Total score 30     Health Maintenance  Topic Date Due  . INFLUENZA VACCINE  10/27/2014  . TETANUS/TDAP   09/05/2018  . DEXA SCAN  Completed  . ZOSTAVAX  Completed  . PNA vac Low Risk Adult  Completed    Urinary incontinence?  No problems Functional Status Survey: Is the patient deaf or have difficulty hearing?: No Does the patient have difficulty seeing, even when wearing glasses/contacts?: No Does the patient have difficulty concentrating, remembering, or making decisions?: No Does the patient have difficulty walking or climbing stairs?: No Does the patient have difficulty dressing or bathing?: No Does the patient have difficulty doing errands alone such as visiting a doctor's office or shopping?: No Exercise?  No exercising as much.  Still doing 4x per week water exercise Diet?  Eats a lot of processed foods with salt in them and does not plan to change that No hearing difficulty Vision ok--sees ophthalmology 2x per year Dentition: top dentures fit, bottom don't due to bone loss Pain: osteoarthritis of joints--not walking as much b/c of pain.  Still doing pool exercises 4x per week at [facility].  Needs something for pain.  Uses the celebrex and takes tylenol pm at night.  Not using regular tylenol during the day though.  Has gained weight due to pain.  Still getting leg cramps at night 10/30 days.  Still taking magnesium and potassium.    HTN:  Checking bp too often.  Taking lisinopril 10mg .  Readings are variable.    Past Medical History  Diagnosis Date  . Encounter for long-term (current) use of other medications   . B12 deficiency due to diet   . Anemia, unspecified   . Restless legs syndrome (RLS)   . Osteoarthrosis, unspecified whether generalized or localized, unspecified site   . Lumbago   . Cramp of limb   . Senile osteoporosis   . Edema of both legs   . Obesity     Past Surgical History  Procedure Laterality Date  . Stomach surgery  1981    stapleing   . Spine surgery  2005  . Cataract extraction w/ intraocular lens  implant, bilateral  04/2014    Social History    Social History  . Marital Status: Married    Spouse Name: N/A  . Number of Children: N/A  . Years of Education: N/A   Occupational History  . retired Administrator     Social History Main Topics  . Smoking status: Former Smoker    Quit date: 05/22/1975  . Smokeless tobacco: Never Used     Comment: Quit in early 31's  . Alcohol Use: No  . Drug Use: No  . Sexual Activity: Not on file   Other Topics Concern  . Not on file   Social History Narrative   Married -Elenore Rota married 1966   Former Smoker stopped 1977   Alcohol none   Exercise -Deep water 4 times a week for one hour   POA       Allergies  Allergen Reactions  . Prolia [Denosumab] Other (See Comments)    Chest pain  . Shrimp [Shellfish Allergy]     All seafood      Medication List       This list is accurate as of: 05/22/15 10:10 AM.  Always use your most recent med list.               Calcium + D3 600-200 MG-UNIT Tabs  Take one tablet three times daily     celecoxib 100 MG capsule  Commonly known as:  CELEBREX  Take 1 capsule (100 mg total) by mouth daily.     diphenhydramine-acetaminophen 25-500 MG Tabs tablet  Commonly known as:  TYLENOL PM  Take 1 tablet by mouth at bedtime as needed.     Iron 66 MG Tabs  Take one tablet once daily     lisinopril 5 MG tablet  Commonly known as:  PRINIVIL,ZESTRIL  TAKE 1 TABLET DAILY. IF YOUR BLOOD PRESSURE IS OVER 140/90, TAKE AN ADDITIONAL 5 MG TABLET.     magnesium oxide 400 MG tablet  Commonly known as:  MAG-OX  Take 400 mg by mouth. Take one by mouth daily     multivitamin tablet  Take 1 tablet by mouth daily.     Potassium 99 MG Tabs  Take 99 mg by mouth. Take one by mouth daily     VITAMIN B COMPLEX-C PO  Take 1,000 mg by mouth. Take one tablet by mouth daily     Vitamin D 2000 units tablet  Take 2,000 Units by mouth daily.         Review of Systems:  Review of Systems  Constitutional: Negative for fever, activity change,  appetite change and unexpected weight change.  HENT: Negative for hearing loss.   Eyes: Negative for visual disturbance.       Glasses  Respiratory: Negative  for shortness of breath.   Cardiovascular: Negative for chest pain, palpitations and leg swelling.  Gastrointestinal: Positive for diarrhea. Negative for nausea, vomiting, abdominal pain and blood in stool.  Genitourinary: Negative for dysuria, urgency, frequency and flank pain.  Musculoskeletal: Positive for myalgias, joint swelling and arthralgias. Negative for back pain and gait problem.       Muscle cramps  Skin: Negative for color change.  Neurological: Negative for tremors, syncope, speech difficulty, weakness, light-headedness, numbness and headaches.  Hematological: Negative for adenopathy.  Psychiatric/Behavioral: Negative for suicidal ideas, confusion, dysphoric mood, decreased concentration and agitation. The patient is not nervous/anxious.     Physical Exam: Filed Vitals:   05/22/15 0900  BP: 110/70  Pulse: 70  Temp: 98 F (36.7 C)  TempSrc: Oral  Height: 5\' 1"  (1.549 m)  Weight: 169 lb (76.658 kg)  SpO2: 97%   Body mass index is 31.95 kg/(m^2). Physical Exam  Constitutional: She is oriented to person, place, and time. She appears well-developed and well-nourished. No distress.  HENT:  Head: Normocephalic and atraumatic.  Right Ear: External ear normal.  Left Ear: External ear normal.  Nose: Nose normal.  Mouth/Throat: Oropharynx is clear and moist.  Eyes: Conjunctivae and EOM are normal. Pupils are equal, round, and reactive to light. No scleral icterus.  glasses  Neck: Normal range of motion. Neck supple. No JVD present. No thyromegaly present.  Cardiovascular: Normal rate, regular rhythm, normal heart sounds and intact distal pulses.  Exam reveals no gallop and no friction rub.   No murmur heard. Pulmonary/Chest: Effort normal and breath sounds normal. No respiratory distress.  Abdominal: Soft. Bowel  sounds are normal. She exhibits no distension and no mass. There is no tenderness. There is no rebound and no guarding.  Musculoskeletal: Normal range of motion. She exhibits no edema or tenderness.  left thumb pain, visible swollen tender area over radial side of palmar aspect of left wrist; cannot abduct her thumb  Lymphadenopathy:    She has no cervical adenopathy.  Neurological: She is alert and oriented to person, place, and time. She has normal reflexes.  Skin: Skin is warm. No rash noted. No erythema. No pallor.  Psychiatric: She has a normal mood and affect. Her behavior is normal. Judgment and thought content normal.    Labs reviewed: Basic Metabolic Panel:  Recent Labs  11/14/14 0931 05/20/15 0818  NA 139 140  K 4.9 4.8  CL 100 101  CO2 22 24  GLUCOSE 78 82  BUN 26 25  CREATININE 1.14* 1.03*  CALCIUM 9.5 9.3  MG 2.0 2.0   Liver Function Tests:  Recent Labs  05/20/15 0818  AST 31  ALT 25  ALKPHOS 51  BILITOT 0.4  PROT 6.7  ALBUMIN 4.2   No results for input(s): LIPASE, AMYLASE in the last 8760 hours. No results for input(s): AMMONIA in the last 8760 hours. CBC:  Recent Labs  05/20/15 0818  WBC 5.7  NEUTROABS 3.0  HCT 36.1  MCV 92  PLT 277   Lipid Panel: No results for input(s): CHOL, HDL, LDLCALC, TRIG, CHOLHDL, LDLDIRECT in the last 8760 hours. Lab Results  Component Value Date   HGBA1C 5.6 03/04/2013   EKG: NSR at 72 with PACs, stable from 10/01/12 EKG  Assessment/Plan 1. Medicare annual wellness visit, subsequent - ? Flu shot, appears it was missed--last was in 2/16 on file - pneumonia shots updated -fall documentation updated - CBC with Differential/Platelet; Future - Comprehensive metabolic panel; Future - Lipid panel;  Future  2. Essential hypertension, benign - bp at goal with low dose lisinopril despite her diet including high preservative foods--has been educated on this, but refuses to cook fresh items for herself - EKG 12-Lead  stable  - Comprehensive metabolic panel; Future  3. Primary osteoarthritis involving multiple joints - wants to get celebrex from [place] so Rx handwritten for her to do this  4. Bilateral leg cramps - ongoing, advised to try CoQ10 as other things are not working like mustard, stretches, taking magnesium and ca with D - Coenzyme Q10 100 MG capsule; Take 1 capsule (100 mg total) by mouth daily.  Dispense: 30 capsule; Refill: 3 - Comprehensive metabolic panel; Future  5. Senile osteoporosis -cont ca with D and additional D  6. Anemia associated with nutritional deficiency, unspecified nutritional anemia type - cont iron and B complex vitamins b/c she won't change her diet - CBC with Differential/Platelet; Future  7. B12 deficiency due to diet - ongoing, cont b complex vitamin - CBC with Differential/Platelet; Future  8. Overweight - counseled on diet and exercise, does swim regularly but diet is not good - Lipid panel; Future  9. Thumb pain, left - and inability to abduct it/flatten the palmar aspect -she has a small mass on the palmar radial side in the vicinity of the first digit tendon suggesting at least a partial tear -encouraged use of tylenol until she gets her celebrex from [place] as she feels the generic is ineffective (was also going to take generic bid instead) - AMB referral to orthopedics, Dr. Fredna Dow  Labs/tests ordered:   Orders Placed This Encounter  Procedures  . CBC with Differential/Platelet    Standing Status: Future     Number of Occurrences:      Standing Expiration Date: 05/21/2016  . Comprehensive metabolic panel    Standing Status: Future     Number of Occurrences:      Standing Expiration Date: 05/21/2016    Order Specific Question:  Has the patient fasted?    Answer:  Yes  . Lipid panel    Standing Status: Future     Number of Occurrences:      Standing Expiration Date: 05/21/2016    Order Specific Question:  Has the patient fasted?    Answer:  Yes   . AMB referral to orthopedics    Referral Priority:  Routine    Referral Type:  Consultation    Number of Visits Requested:  1  . EKG 12-Lead  Next appt:  6 mos med mgt, labs before   Dlisa Barnwell L. Chanita Boden, D.O. [facility] [address], [place] [address] Cell Phone (Mon-Fri 8am-5pm):  [phone] On Call:  [phone] & follow prompts after 5pm & weekends Office Phone:  [phone] Office Fax:  [phone]

## 2015-05-22 NOTE — Patient Instructions (Addendum)
Bring your bp monitor back in here at your next visit for calibration.    Try CoQ10 for your leg cramps.  You may also use regular tylenol 325mg  during the day and you may use your generic celebrex twice a day until you get the brand name celebrex from [place].

## 2015-05-22 NOTE — Progress Notes (Signed)
Patient ID: Jody [name], female   DOB: 04-08-1940, 75 y.o.   MRN: [id number] MMSE 30/30/ passed clock drawing

## 2015-05-25 ENCOUNTER — Encounter: Payer: Self-pay | Admitting: Internal Medicine

## 2015-05-31 ENCOUNTER — Encounter: Payer: Self-pay | Admitting: Internal Medicine

## 2015-06-08 DIAGNOSIS — M47812 Spondylosis without myelopathy or radiculopathy, cervical region: Secondary | ICD-10-CM | POA: Insufficient documentation

## 2015-06-08 DIAGNOSIS — M19042 Primary osteoarthritis, left hand: Secondary | ICD-10-CM | POA: Diagnosis not present

## 2015-06-08 DIAGNOSIS — M79641 Pain in right hand: Secondary | ICD-10-CM | POA: Diagnosis not present

## 2015-06-08 DIAGNOSIS — M19041 Primary osteoarthritis, right hand: Secondary | ICD-10-CM | POA: Diagnosis not present

## 2015-06-08 DIAGNOSIS — G5603 Carpal tunnel syndrome, bilateral upper limbs: Secondary | ICD-10-CM | POA: Diagnosis not present

## 2015-06-08 DIAGNOSIS — M79642 Pain in left hand: Secondary | ICD-10-CM | POA: Diagnosis not present

## 2015-06-08 DIAGNOSIS — M180 Bilateral primary osteoarthritis of first carpometacarpal joints: Secondary | ICD-10-CM | POA: Diagnosis not present

## 2015-06-16 DIAGNOSIS — Z961 Presence of intraocular lens: Secondary | ICD-10-CM | POA: Diagnosis not present

## 2015-06-16 DIAGNOSIS — H52223 Regular astigmatism, bilateral: Secondary | ICD-10-CM | POA: Diagnosis not present

## 2015-06-16 DIAGNOSIS — H5201 Hypermetropia, right eye: Secondary | ICD-10-CM | POA: Diagnosis not present

## 2015-06-16 DIAGNOSIS — H43811 Vitreous degeneration, right eye: Secondary | ICD-10-CM | POA: Diagnosis not present

## 2015-06-16 DIAGNOSIS — H35341 Macular cyst, hole, or pseudohole, right eye: Secondary | ICD-10-CM | POA: Diagnosis not present

## 2015-06-16 DIAGNOSIS — H43821 Vitreomacular adhesion, right eye: Secondary | ICD-10-CM | POA: Diagnosis not present

## 2015-06-23 DIAGNOSIS — Z1231 Encounter for screening mammogram for malignant neoplasm of breast: Secondary | ICD-10-CM | POA: Diagnosis not present

## 2015-06-23 LAB — HM MAMMOGRAPHY: HM Mammogram: NORMAL

## 2015-07-03 DIAGNOSIS — M542 Cervicalgia: Secondary | ICD-10-CM | POA: Diagnosis not present

## 2015-07-07 DIAGNOSIS — G5601 Carpal tunnel syndrome, right upper limb: Secondary | ICD-10-CM | POA: Diagnosis not present

## 2015-07-07 DIAGNOSIS — M180 Bilateral primary osteoarthritis of first carpometacarpal joints: Secondary | ICD-10-CM | POA: Diagnosis not present

## 2015-07-07 DIAGNOSIS — G5602 Carpal tunnel syndrome, left upper limb: Secondary | ICD-10-CM | POA: Diagnosis not present

## 2015-07-07 DIAGNOSIS — M79641 Pain in right hand: Secondary | ICD-10-CM | POA: Diagnosis not present

## 2015-07-07 DIAGNOSIS — M79642 Pain in left hand: Secondary | ICD-10-CM | POA: Diagnosis not present

## 2015-07-07 DIAGNOSIS — G5603 Carpal tunnel syndrome, bilateral upper limbs: Secondary | ICD-10-CM | POA: Diagnosis not present

## 2015-07-08 DIAGNOSIS — G5603 Carpal tunnel syndrome, bilateral upper limbs: Secondary | ICD-10-CM | POA: Diagnosis not present

## 2015-07-08 DIAGNOSIS — M19041 Primary osteoarthritis, right hand: Secondary | ICD-10-CM | POA: Diagnosis not present

## 2015-07-08 DIAGNOSIS — M180 Bilateral primary osteoarthritis of first carpometacarpal joints: Secondary | ICD-10-CM | POA: Diagnosis not present

## 2015-11-13 ENCOUNTER — Other Ambulatory Visit: Payer: Self-pay | Admitting: Internal Medicine

## 2015-11-13 NOTE — Addendum Note (Signed)
Addended by: Logan Bores on: 11/13/2015 03:03 PM   Modules accepted: Orders

## 2015-11-16 ENCOUNTER — Other Ambulatory Visit: Payer: Medicare Other

## 2015-11-16 DIAGNOSIS — I10 Essential (primary) hypertension: Secondary | ICD-10-CM | POA: Diagnosis not present

## 2015-11-16 DIAGNOSIS — E663 Overweight: Secondary | ICD-10-CM | POA: Diagnosis not present

## 2015-11-16 DIAGNOSIS — E538 Deficiency of other specified B group vitamins: Secondary | ICD-10-CM | POA: Diagnosis not present

## 2015-11-16 DIAGNOSIS — R252 Cramp and spasm: Secondary | ICD-10-CM | POA: Diagnosis not present

## 2015-11-16 DIAGNOSIS — D539 Nutritional anemia, unspecified: Secondary | ICD-10-CM

## 2015-11-16 DIAGNOSIS — Z0000 Encounter for general adult medical examination without abnormal findings: Secondary | ICD-10-CM | POA: Diagnosis not present

## 2015-11-16 LAB — CBC WITH DIFFERENTIAL/PLATELET
Basophils Absolute: 0 cells/uL (ref 0–200)
Basophils Relative: 0 %
Eosinophils Absolute: 250 cells/uL (ref 15–500)
Eosinophils Relative: 5 %
HCT: 34.7 % — ABNORMAL LOW (ref 35.0–45.0)
Hemoglobin: 11.2 g/dL — ABNORMAL LOW (ref 11.7–15.5)
Lymphocytes Relative: 30 %
Lymphs Abs: 1500 cells/uL (ref 850–3900)
MCH: 29.7 pg (ref 27.0–33.0)
MCHC: 32.3 g/dL (ref 32.0–36.0)
MCV: 92 fL (ref 80.0–100.0)
MPV: 9.1 fL (ref 7.5–12.5)
Monocytes Absolute: 500 cells/uL (ref 200–950)
Monocytes Relative: 10 %
Neutro Abs: 2750 cells/uL (ref 1500–7800)
Neutrophils Relative %: 55 %
Platelets: 273 10*3/uL (ref 140–400)
RBC: 3.77 MIL/uL — ABNORMAL LOW (ref 3.80–5.10)
RDW: 13 % (ref 11.0–15.0)
WBC: 5 10*3/uL (ref 3.8–10.8)

## 2015-11-16 LAB — COMPREHENSIVE METABOLIC PANEL
ALT: 20 U/L (ref 6–29)
AST: 33 U/L (ref 10–35)
Albumin: 4.3 g/dL (ref 3.6–5.1)
Alkaline Phosphatase: 43 U/L (ref 33–130)
BUN: 23 mg/dL (ref 7–25)
CO2: 26 mmol/L (ref 20–31)
Calcium: 9.9 mg/dL (ref 8.6–10.4)
Chloride: 106 mmol/L (ref 98–110)
Creat: 1.13 mg/dL — ABNORMAL HIGH (ref 0.60–0.93)
Glucose, Bld: 90 mg/dL (ref 65–99)
Potassium: 4.7 mmol/L (ref 3.5–5.3)
Sodium: 140 mmol/L (ref 135–146)
Total Bilirubin: 0.4 mg/dL (ref 0.2–1.2)
Total Protein: 6.9 g/dL (ref 6.1–8.1)

## 2015-11-16 LAB — LIPID PANEL
Cholesterol: 173 mg/dL (ref 125–200)
HDL: 65 mg/dL (ref >=46)
LDL Cholesterol: 86 mg/dL (ref <130)
Total CHOL/HDL Ratio: 2.7 Ratio (ref <=5.0)
Triglycerides: 108 mg/dL (ref <150)
VLDL: 22 mg/dL (ref <30)

## 2015-11-17 ENCOUNTER — Encounter: Payer: Self-pay | Admitting: *Deleted

## 2015-11-17 DIAGNOSIS — H35371 Puckering of macula, right eye: Secondary | ICD-10-CM | POA: Diagnosis not present

## 2015-11-19 ENCOUNTER — Ambulatory Visit (INDEPENDENT_AMBULATORY_CARE_PROVIDER_SITE_OTHER): Payer: Medicare Other | Admitting: Internal Medicine

## 2015-11-19 ENCOUNTER — Encounter: Payer: Self-pay | Admitting: Internal Medicine

## 2015-11-19 VITALS — BP 120/70 | HR 67 | Temp 97.7°F | Ht 61.0 in | Wt 170.0 lb

## 2015-11-19 DIAGNOSIS — Z23 Encounter for immunization: Secondary | ICD-10-CM | POA: Diagnosis not present

## 2015-11-19 DIAGNOSIS — M8949 Other hypertrophic osteoarthropathy, multiple sites: Secondary | ICD-10-CM

## 2015-11-19 DIAGNOSIS — G4762 Sleep related leg cramps: Secondary | ICD-10-CM

## 2015-11-19 DIAGNOSIS — D539 Nutritional anemia, unspecified: Secondary | ICD-10-CM

## 2015-11-19 DIAGNOSIS — I10 Essential (primary) hypertension: Secondary | ICD-10-CM

## 2015-11-19 DIAGNOSIS — M150 Primary generalized (osteo)arthritis: Secondary | ICD-10-CM | POA: Diagnosis not present

## 2015-11-19 DIAGNOSIS — M810 Age-related osteoporosis without current pathological fracture: Secondary | ICD-10-CM

## 2015-11-19 DIAGNOSIS — E669 Obesity, unspecified: Secondary | ICD-10-CM

## 2015-11-19 DIAGNOSIS — M159 Polyosteoarthritis, unspecified: Secondary | ICD-10-CM

## 2015-11-19 MED ORDER — ROPINIROLE HCL 0.25 MG PO TABS
0.2500 mg | ORAL_TABLET | Freq: Every day | ORAL | 3 refills | Status: DC
Start: 2015-11-19 — End: 2016-03-15

## 2015-11-19 NOTE — Progress Notes (Signed)
Location:  [facility] clinic Provider:  Iram Lundberg L. Mariea Clonts, D.O., C.M.D.  Code Status: DNR Goals of Care:  Advanced Directives 11/19/2015  Does patient have an advance directive? Yes  Type of Advance Directive [place]  Does patient want to make changes to advanced directive? -  Copy of advanced directive(s) in chart? Yes     Chief Complaint  Patient presents with  . Medical Management of Chronic Issues    68mth follow-up    HPI: Patient is a 75 y.o. female seen today for medical management of chronic diseases.    Has been able to get her celebrex from [place] so her pain is better controlled.  Still has the weight issue.  She had gained weight over the past year.  Stamina is not what it was.  Cannot stand all day like she once could.  [place]es [place] the beach and aquatic center--hoping she can increase her stamina again. Wt 170 currently.    Leg cramps are no better. She's taking mag, potassium, coq10.  Last night she was up three times and has to get up out of bed, walk and pace.   Even had a cramp in the big toe last night.    Agrees to try low dose of requip.  Had been walking less b/c of it being less fun.  Plans to walk more there and has to do steps in the place she rented for two months.  Also has exercise programs to participate in.    Past Medical History:  Diagnosis Date  . Anemia, unspecified   . B12 deficiency due to diet   . Cramp of limb   . Edema of both legs   . Encounter for long-term (current) use of other medications   . Lumbago   . Obesity   . Osteoarthrosis, unspecified whether generalized or localized, unspecified site   . Restless legs syndrome (RLS)   . Senile osteoporosis     Past Surgical History:  Procedure Laterality Date  . CATARACT EXTRACTION W/ INTRAOCULAR LENS  IMPLANT, BILATERAL  04/2014  . [place]  2005  . STOMACH SURGERY  1981   stapleing     Allergies  Allergen Reactions  . Prolia [Denosumab] Other (See Comments)   Chest pain  . Shrimp [Shellfish Allergy]     All seafood      Medication List       Accurate as of 11/19/15  8:56 AM. Always use your most recent med list.          Calcium + D3 600-200 MG-UNIT Tabs Take one tablet three times daily   celecoxib 100 MG capsule Commonly known as:  CELEBREX Take 1 capsule (100 mg total) by mouth daily.   Coenzyme Q10 100 MG capsule Take 1 capsule (100 mg total) by mouth daily.   diphenhydramine-acetaminophen 25-500 MG Tabs tablet Commonly known as:  TYLENOL PM Take 1 tablet by mouth at bedtime as needed.   Iron 66 MG Tabs Take one tablet once daily   lisinopril 5 MG tablet Commonly known as:  PRINIVIL,ZESTRIL TAKE 1 TABLET DAILY (IF YOUR BLOOD PRESSURE IS OVER 140/90, TAKE AN ADDITIONAL 5 MG TABLET)   magnesium oxide 400 MG tablet Commonly known as:  MAG-OX Take 400 mg by mouth. Take one by mouth daily   multivitamin tablet Take 1 tablet by mouth daily.   Potassium 99 MG Tabs Take 99 mg by mouth. Take one by mouth daily   VITAMIN B COMPLEX-C PO  Take 1,000 mg by mouth. Take one tablet by mouth daily   Vitamin D 2000 units tablet Take 2,000 Units by mouth daily.       Review of Systems:  Review of Systems  Constitutional: Positive for malaise/fatigue. Negative for chills and fever.  HENT: Negative for congestion and hearing loss.   Eyes: Negative for blurred vision.       Glasses  Respiratory: Negative for cough and shortness of breath.   Cardiovascular: Negative for chest pain, palpitations and leg swelling.  Gastrointestinal: Negative for abdominal pain, blood in stool, constipation and melena.  Genitourinary: Negative for dysuria, frequency and urgency.  Musculoskeletal: Positive for joint pain and neck pain. Negative for falls.  Skin: Negative for itching and rash.  Neurological: Negative for dizziness, loss of consciousness, weakness and headaches.  Endo/Heme/Allergies: Does not bruise/bleed easily.    Psychiatric/Behavioral: Negative for memory loss.    Health Maintenance  Topic Date Due  . INFLUENZA VACCINE  10/27/2015  . TETANUS/TDAP  09/05/2018  . DEXA SCAN  Completed  . ZOSTAVAX  Completed  . PNA vac Low Risk Adult  Completed    Physical Exam: Vitals:   11/19/15 0837  BP: 120/70  Pulse: 67  Temp: 97.7 F (36.5 C)  TempSrc: Oral  SpO2: 95%  Weight: 170 lb (77.1 kg)  Height: 5\' 1"  (1.549 m)   Body mass index is 32.12 kg/m. Physical Exam  Constitutional: She is oriented to person, place, and time. She appears well-developed and well-nourished.  Cardiovascular: Normal rate, regular rhythm, normal heart sounds and intact distal pulses.   Pulmonary/Chest: Effort normal and breath sounds normal. No respiratory distress.  Abdominal: Soft. Bowel sounds are normal.  Musculoskeletal: Normal range of motion.  Neurological: She is alert and oriented to person, place, and time.  Skin: Skin is warm and dry. There is pallor.  Psychiatric: She has a normal mood and affect.    Labs reviewed: Basic Metabolic Panel:  Recent Labs  05/20/15 0818 11/16/15 0916  NA 140 140  K 4.8 4.7  CL 101 106  CO2 24 26  GLUCOSE 82 90  BUN 25 23  CREATININE 1.03* 1.13*  CALCIUM 9.3 9.9  MG 2.0  --    Liver Function Tests:  Recent Labs  05/20/15 0818 11/16/15 0916  AST 31 33  ALT 25 20  ALKPHOS 51 43  BILITOT 0.4 0.4  PROT 6.7 6.9  ALBUMIN 4.2 4.3   No results for input(s): LIPASE, AMYLASE in the last 8760 hours. No results for input(s): AMMONIA in the last 8760 hours. CBC:  Recent Labs  05/20/15 0818 11/16/15 0916  WBC 5.7 5.0  NEUTROABS 3.0 2,750  HGB  --  11.2*  HCT 36.1 34.7*  MCV 92 92.0  PLT 277 273   Lipid Panel:  Recent Labs  11/16/15 0916  CHOL 173  HDL 65  LDLCALC 86  TRIG 108  CHOLHDL 2.7   Lab Results  Component Value Date   HGBA1C 5.6 03/04/2013    Assessment/Plan 1. Primary osteoarthritis involving multiple joints -cont celebrex due  to excellent benefit, monitor renal function, cont swimming and try to increase walking  2. Essential hypertension, benign - bp at goal with current regimen, cont same and monitor - Basic metabolic panel; Future - Hepatic Function Panel; Future  3. Senile osteoporosis -did not tolerate prolia--got chest pains -cont ca with D and additional D3 and monitor bone density -cont weightbearing exercise and swimming for strength and endurance  4. Anemia  associated with nutritional deficiency, unspecified nutritional anemia type - f/u labs again next time as anemia was slightly worse but may be a difference in labs - CBC with Differential/Platelet; Future - Iron and TIBC; Future - Ferritin; Future  5. Leg cramps, sleep related - will try her on low dose requip due to severity and frequency of cramps and lack of response to iron, mag, b vitamins, co q10 -unclear why she has them - rOPINIRole (REQUIP) 0.25 MG tablet; Take 1 tablet (0.25 mg total) by mouth at bedtime.  Dispense: 30 tablet; Refill: 3  6. Need for immunization against influenza - given today - Flu Vaccine QUAD 36+ mos PF IM (Fluarix & Fluzone Quad PF)  7. Obesity (BMI 30.0-34.9) -has gained weight and now trying to lose it again - f/u labs before next visit: - Lipid panel; Future  Labs/tests ordered:   Orders Placed This Encounter  Procedures  . Flu Vaccine QUAD 36+ mos PF IM (Fluarix & Fluzone Quad PF)  . CBC with Differential/Platelet    Standing Status:   Future    Standing Expiration Date:   11/18/2016  . Basic metabolic panel    Standing Status:   Future    Standing Expiration Date:   11/18/2016    Order Specific Question:   Has the patient fasted?    Answer:   Yes  . Lipid panel    Standing Status:   Future    Standing Expiration Date:   11/18/2016    Order Specific Question:   Has the patient fasted?    Answer:   Yes  . Hepatic Function Panel    Standing Status:   Future    Standing Expiration Date:    11/18/2016  . Iron and TIBC    Standing Status:   Future    Standing Expiration Date:   11/18/2016  . Ferritin    Standing Status:   Future    Standing Expiration Date:   11/18/2016    Next appt:  6 mos for annual  Kamilia Carollo L. Hadlei Stitt, D.O. [facility] [address], [place] [address] Cell Phone (Mon-Fri 8am-5pm):  [phone] On Call:  [phone] & follow prompts after 5pm & weekends Office Phone:  [phone] Office Fax:  [phone]

## 2015-12-02 DIAGNOSIS — S46911A Strain of unspecified muscle, fascia and tendon at shoulder and upper arm level, right arm, initial encounter: Secondary | ICD-10-CM | POA: Diagnosis not present

## 2015-12-02 DIAGNOSIS — S40019A Contusion of unspecified shoulder, initial encounter: Secondary | ICD-10-CM | POA: Diagnosis not present

## 2016-02-02 ENCOUNTER — Telehealth (INDEPENDENT_AMBULATORY_CARE_PROVIDER_SITE_OTHER): Payer: Self-pay

## 2016-02-02 ENCOUNTER — Ambulatory Visit (INDEPENDENT_AMBULATORY_CARE_PROVIDER_SITE_OTHER): Payer: Medicare Other

## 2016-02-02 ENCOUNTER — Encounter (INDEPENDENT_AMBULATORY_CARE_PROVIDER_SITE_OTHER): Payer: Self-pay | Admitting: Sports Medicine

## 2016-02-02 ENCOUNTER — Ambulatory Visit (INDEPENDENT_AMBULATORY_CARE_PROVIDER_SITE_OTHER): Payer: Medicare Other | Admitting: Sports Medicine

## 2016-02-02 ENCOUNTER — Ambulatory Visit
Admission: RE | Admit: 2016-02-02 | Discharge: 2016-02-02 | Disposition: A | Discharge status: Home / Self Care | Payer: Medicare Other | Source: Ambulatory Visit | Attending: Sports Medicine | Admitting: Sports Medicine

## 2016-02-02 VITALS — BP 123/69 | HR 81 | Ht 61.0 in | Wt 170.0 lb

## 2016-02-02 DIAGNOSIS — M25062 Hemarthrosis, left knee: Secondary | ICD-10-CM

## 2016-02-02 DIAGNOSIS — M25562 Pain in left knee: Secondary | ICD-10-CM | POA: Diagnosis not present

## 2016-02-02 DIAGNOSIS — S82032A Displaced transverse fracture of left patella, initial encounter for closed fracture: Secondary | ICD-10-CM | POA: Diagnosis not present

## 2016-02-02 NOTE — Telephone Encounter (Signed)
Reviewed. Will review with patient tomorrow. Likely nonoperative management.

## 2016-02-02 NOTE — Progress Notes (Signed)
Jody [name] - 75 y.o. female MRN [id number]  Date of birth: [birth year]  Office Visit Note: Visit Date: 02/02/2016 PCP: Hollace Kinnier, DO Referred by: Gayland Curry, DO  Subjective: Chief Complaint  Patient presents with  . Left Knee - Injury   HPI: Patient states that she fell last night.  States she can't bear weight on her left knee at all.  Left knee is swollen. Reports tripping over a back she was packing for an upcoming trip to the [place]. She is highly active & typically has no significant limitations. Anti-inflammatories have not been helpful.    ROS Otherwise per HPI.  Assessment & Plan: Visit Diagnoses:  1. Acute pain of left knee   2. Hemarthrosis of left knee     Plan: Findings:  Large hemarthrosis today. CT scan of the knee is indicated. Follow-up tomorrow for further discussion of appropriate management. 6 inch Ace wrap & Knee immobilizer provided.     Meds & Orders: No orders of the defined types were placed in this encounter.   Orders Placed This Encounter  Procedures  . Large Joint Injection/Arthrocentesis  . XR KNEE 3 VIEW LEFT  . CT KNEE LEFT WO CONTRAST    Follow-up: Return in 1 day (on 02/03/2016) for To review CT results & discuss further management including consideration of injection..   Procedures: Ultrasound-guided left knee aspiration Date/Time: 02/02/2016 11:49 AM Performed by: Teresa Coombs D Authorized by: Teresa Coombs D   Indications:  Pain and joint swelling Location:  Knee Site:  L knee Needle Size:  18 G Approach:  Superolateral Ultrasound Guidance: Yes   Fluoroscopic Guidance: No   Arthrogram: No Aspiration Attempted: Yes   Aspirate amount (mL):  70 Aspirate:  Bloody Comments: The patient's clinical condition is marked by substantial pain and/or significant functional disability. Other conservative therapy has not provided relief, is contraindicated, or not appropriate. There is a reasonable likelihood that  injection will significantly improve the patient's pain and/or functional impairment.  After discussing the risks, benefits and expected outcomes of the injection and all questions were reviewed and answered, the patient wished to undergo the above named procedure.  Verbal consent was obtained. The target sight was prepped with alcohol scrub. Local anesthesia was obtained with ethyl chloride and 48mL of 1% lidocaine on a 25g needle.  Under real-time ultrasound guidance, Aspiration of the target structure was performed using the above needle and medications under sterile technique. Band-Aid and 6" Ace Wrap and knee immoblizer was applied. The patient tolerated this procedure well with no immediate complications.  Post aspiration pain was slightly improved still unable to straighten the knee without significant pain. Post injection instructions were provided.       No notes on file   Clinical History: No specialty comments available.  She reports that she quit smoking about 40 years ago. She has never used smokeless tobacco. No results for input(s): HGBA1C, LABURIC in the last 8760 hours.  Objective:  VS:  HT:5\' 1"  (154.9 cm)   WT:170 lb (77.1 kg)  BMI:32.2    BP:123/69  HR:81bpm  TEMP: ( )  RESP:  Physical Exam  Constitutional: She appears well-developed and well-nourished. No distress.  HENT:  Head: Normocephalic and atraumatic.  Pulmonary/Chest: Effort normal. No respiratory distress.  Neurological: She is alert.  Appropriately interactive.  Skin: Skin is warm and dry. No rash noted. She is not diaphoretic. No erythema. No pallor.  Psychiatric: She has a normal mood and affect. Her  behavior is normal. Judgment and thought content normal.    Left Knee Exam   Comments:  Patient sitting in a wheelchair. Talkative & appropriate but quite uncomfortable with any type of knee motion. She's unable to fully extend her knee without assistance. Pain with any type of knee flexion or extension. She  has a large effusion. Generalized tenderness to palpation.     Imaging: Ct Knee Left Wo Contrast  Result Date: 02/02/2016 CLINICAL DATA:  Left knee pain and swelling.  [place] 02/01/2016. EXAM: CT OF THE radiographs 02/02/2016 KNEE WITHOUT CONTRAST TECHNIQUE: Multidetector CT imaging of the left knee was performed according to the standard protocol. Multiplanar CT image reconstructions were also generated. COMPARISON:  Outside radiographs 02/02/2016 FINDINGS: Bones/Joint/Cartilage As demonstrated on the radiographs there is a mildly displaced fracture involving the inferior 1/3 of the patella. Maximum distraction is 3.5 mm. The femur, tibia and fibula are intact. No definite fractures. There is a moderate-sized high attenuation joint effusion consistent with a hemarthrosis. Ligaments Suboptimally assessed by CT. Muscles and Tendons No obvious muscle tear. The quadriceps and patellar tendons are intact. The medial and lateral collateral ligaments are grossly normal. Soft tissues Superficial soft tissue swelling noted over the patella. There is also a small calcification noted in the soft tissues. IMPRESSION: Mildly displaced transverse fracture involving the midpole lower pole junction region of the patella with 3.5 mm of distraction. No other fractures are identified. Associated lipohemarthrosis. Electronically Signed   By: Marijo Sanes M.D.   On: 02/02/2016 13:39   Xr Knee 3 View Left  Result Date: 02/03/2016 Findings: Well aligned knee. There is a radiolucency within the patella that is incompletely characterized. Soft tissue swelling is present. No obvious tibial plateau fracture. Lateral compartment moderate osteoarthritis Impression: Incompletely characterized patellar lesion concernin for potential patellar fracture with underlying lateral compartment osteoarthritis.   Past Medical/Family/Surgical/Social History: Medications & Allergies reviewed per EMR Patient Active Problem List   Diagnosis  Date Noted  . Leg cramps, sleep related 11/19/2015  . Absolute anemia 05/16/2014  . Primary osteoarthritis involving multiple joints 05/16/2014  . Cataract cortical, senile 05/16/2014  . Essential hypertension, benign 09/03/2012  . Edema of both legs   . Senile osteoporosis   . B12 deficiency due to diet    Past Medical History:  Diagnosis Date  . Anemia, unspecified   . B12 deficiency due to diet   . Cramp of limb   . Edema of both legs   . Encounter for long-term (current) use of other medications   . Lumbago   . Obesity   . Osteoarthrosis, unspecified whether generalized or localized, unspecified site   . Restless legs syndrome (RLS)   . Senile osteoporosis    Family History  Problem Relation Age of Onset  . Cancer Sister   . Cancer Brother    Past Surgical History:  Procedure Laterality Date  . CATARACT EXTRACTION W/ INTRAOCULAR LENS  IMPLANT, BILATERAL  04/2014  . [place]  2005  . STOMACH SURGERY  1981   stapleing    Social History   Occupational History  . retired Administrator     Social History Main Topics  . Smoking status: Former Smoker    Quit date: 05/22/1975  . Smokeless tobacco: Never Used     Comment: Quit in early 33's  . Alcohol use No  . Drug use: No  . Sexual activity: Not on file

## 2016-02-02 NOTE — Telephone Encounter (Signed)
[facility] called with call report on patients scan. It is under "images" tab

## 2016-02-03 ENCOUNTER — Ambulatory Visit (INDEPENDENT_AMBULATORY_CARE_PROVIDER_SITE_OTHER): Payer: Medicare Other

## 2016-02-03 ENCOUNTER — Encounter (HOSPITAL_COMMUNITY): Payer: Self-pay

## 2016-02-03 ENCOUNTER — Ambulatory Visit (INDEPENDENT_AMBULATORY_CARE_PROVIDER_SITE_OTHER): Payer: Medicare Other | Admitting: Sports Medicine

## 2016-02-03 ENCOUNTER — Emergency Department (HOSPITAL_COMMUNITY)
Admission: EM | Admit: 2016-02-03 | Discharge: 2016-02-03 | Disposition: A | Discharge status: Home / Self Care | Payer: Medicare Other | Attending: Emergency Medicine | Admitting: Emergency Medicine

## 2016-02-03 ENCOUNTER — Encounter (INDEPENDENT_AMBULATORY_CARE_PROVIDER_SITE_OTHER): Payer: Self-pay | Admitting: Sports Medicine

## 2016-02-03 VITALS — BP 147/73 | HR 84 | Ht 61.0 in | Wt 170.0 lb

## 2016-02-03 DIAGNOSIS — Y939 Activity, unspecified: Secondary | ICD-10-CM | POA: Insufficient documentation

## 2016-02-03 DIAGNOSIS — Z87891 Personal history of nicotine dependence: Secondary | ICD-10-CM | POA: Insufficient documentation

## 2016-02-03 DIAGNOSIS — M25562 Pain in left knee: Secondary | ICD-10-CM | POA: Diagnosis not present

## 2016-02-03 DIAGNOSIS — Y92009 Unspecified place in unspecified non-institutional (private) residence as the place of occurrence of the external cause: Secondary | ICD-10-CM | POA: Diagnosis not present

## 2016-02-03 DIAGNOSIS — I10 Essential (primary) hypertension: Secondary | ICD-10-CM | POA: Diagnosis not present

## 2016-02-03 DIAGNOSIS — Y999 Unspecified external cause status: Secondary | ICD-10-CM | POA: Diagnosis not present

## 2016-02-03 DIAGNOSIS — S82031A Displaced transverse fracture of right patella, initial encounter for closed fracture: Secondary | ICD-10-CM | POA: Diagnosis not present

## 2016-02-03 DIAGNOSIS — W19XXXA Unspecified fall, initial encounter: Secondary | ICD-10-CM | POA: Insufficient documentation

## 2016-02-03 DIAGNOSIS — M79645 Pain in left finger(s): Secondary | ICD-10-CM

## 2016-02-03 DIAGNOSIS — S62657A Nondisplaced fracture of medial phalanx of left little finger, initial encounter for closed fracture: Secondary | ICD-10-CM

## 2016-02-03 DIAGNOSIS — S82032G Displaced transverse fracture of left patella, subsequent encounter for closed fracture with delayed healing: Secondary | ICD-10-CM | POA: Insufficient documentation

## 2016-02-03 DIAGNOSIS — S62609A Fracture of unspecified phalanx of unspecified finger, initial encounter for closed fracture: Secondary | ICD-10-CM | POA: Insufficient documentation

## 2016-02-03 MED ORDER — TRANSPORT CHAIR MISC: 0 refills | Status: DC

## 2016-02-03 MED ORDER — FOLDING WALKER MISC: 1.0000 [IU] | Freq: Once | 0 refills | Status: AC

## 2016-02-03 MED ORDER — IBUPROFEN 400 MG PO TABS: 400.0000 mg | ORAL_TABLET | Freq: Four times a day (QID) | ORAL | 0 refills | Status: DC | PRN

## 2016-02-03 MED ORDER — ONDANSETRON 4 MG PO TBDP
ORAL_TABLET | ORAL | Status: AC
  Filled 2016-02-03: qty 2

## 2016-02-03 MED ORDER — HYDROMORPHONE HCL 2 MG/ML IJ SOLN
1.0000 mg | Freq: Once | INTRAMUSCULAR | Status: AC
  Administered 2016-02-03: 1 mg via INTRAMUSCULAR
  Filled 2016-02-03: qty 1

## 2016-02-03 MED ORDER — KNEE BRACE MISC: 0 refills | Status: DC

## 2016-02-03 MED ORDER — OXYCODONE HCL 5 MG PO TABS: 5.0000 mg | ORAL_TABLET | ORAL | 0 refills | Status: DC | PRN

## 2016-02-03 MED ORDER — OXYCODONE-ACETAMINOPHEN 5-325 MG PO TABS: 1.0000 | ORAL_TABLET | Freq: Four times a day (QID) | ORAL | 0 refills | Status: DC | PRN

## 2016-02-03 MED ORDER — CYCLOBENZAPRINE HCL 5 MG PO TABS: 5.0000 mg | ORAL_TABLET | Freq: Three times a day (TID) | ORAL | 0 refills | Status: DC | PRN

## 2016-02-03 MED ORDER — HYDROCODONE-ACETAMINOPHEN 5-325 MG PO TABS: 1.0000 | ORAL_TABLET | Freq: Four times a day (QID) | ORAL | 0 refills | Status: DC | PRN

## 2016-02-03 NOTE — Progress Notes (Signed)
Jody [name] - 75 y.o. female MRN [id number]  Date of birth: Mar 10, 1941  Office Visit Note: Visit Date: 02/03/2016 PCP: Hollace Kinnier, DO Referred by: Gayland Curry, DO  Subjective: Chief Complaint  Patient presents with  . Left Knee - Pain  . Follow-up    CT Scan results   HPI: Patient here to discuss CT scan results.  States she had a lot pf pain and today she can't even lift left leg up. Patient took knee immobilizer off.  Pinky finger on left hand is swollen and she can't bend finger.    She reports now worsening pain within the left fifth finger. She reports striking this during the fall. Now having difficulty in bruising with any type of flexion & extension of the MCPs most specifically fifth PIP joint.  She is reporting severe pain at this time. Having difficulty with sleeping.    ROS Otherwise per HPI.  Assessment & Plan: Visit Diagnoses:  1. Pain in left finger(s)   2. Closed displaced transverse fracture of right patella, initial encounter   3. Closed nondisplaced fracture of middle phalanx of left little finger, initial encounter     Plan: Findings:  CT scan reviewed with the patient today. Overall the patella is only slightly distracted. We will plan to treat her nonoperatively & will allow her to use a Bledsoe type brace with settings between 20 of flexion 40 of flexion as she does feel that 0 of flexion is significantly uncomfortable for her. I like for her to minimize weightbearing as much as possible & will her prescription for a wheelchair as well as walker for home. I do recommend that she look into canceling the upcoming trips to the [place] as well as to [place]. We did discuss that this can be a prolonged recovery & will plan follow-up with her in one week for repeat x-rays of the knee with AP & lateral views obtained. >50% of this 25 minute visit spent in direct patient counseling and/or coordination of care. Discussion was focused on  education regarding the in discussing the pathoetiology and anticipated clinical course of the above condition.  Buddy taping applied to the fifth finger. We discussed we can use the palmar sided splint if she would like but she had moderate improvement in her pain with Buddy taping alone.    Meds & Orders:  Meds ordered this encounter  Medications  . Elastic Bandages & Supports (KNEE BRACE) MISC    Sig: Dispense 1 Bledsoe Type hinged knee brace set for extension to 20degrees and flexion to 40degrees.  (20 degrees total ROM).  Dx Patellar Fracture    Dispense:  1 each    Refill:  0  . Misc. Devices (TRANSPORT CHAIR) MISC    Sig: 1 light weight transport wheelchair Dx Patellar Fracture    Dispense:  1 each    Refill:  0  . oxyCODONE-acetaminophen (PERCOCET) 5-325 MG tablet    Sig: Take 1 tablet by mouth every 6 (six) hours as needed for severe pain.    Dispense:  30 tablet    Refill:  0  . Misc. Devices (FOLDING WALKER) MISC    Sig: 1 Units by Does not apply route once. Dispense one front wheel rolling walker. Indication patellar fracture duration of need indefinite    Dispense:  1 each    Refill:  0  . HYDROcodone-acetaminophen (NORCO) 5-325 MG tablet    Sig: Take 1 tablet by mouth every 6 (  six) hours as needed.    Dispense:  30 tablet    Refill:  0    Orders Placed This Encounter  Procedures  . XR Finger Little Left    Follow-up: Return in about 1 week (around 02/10/2016).   Procedures: No procedures performed  No notes on file   Clinical History: No specialty comments available.  She reports that she quit smoking about 40 years ago. She has never used smokeless tobacco. No results for input(s): HGBA1C, LABURIC in the last 8760 hours.  Objective:  VS:  HT:5\' 1"  (154.9 cm)   WT:170 lb (77.1 kg)  BMI:32.2    BP:(!) 147/73  HR:84bpm  TEMP: ( )  RESP:  Physical Exam  Ortho Exam Imaging: Xr Finger Little Left  Result Date: 02/03/2016 Significant osteoarthritic  changes with large bone spurs throughout all IP joints & MCPs. She does have an apparent fracture of the fifth IP joint spur on the dorsal aspect.   Past Medical/Family/Surgical/Social History: Medications & Allergies reviewed per EMR Patient Active Problem List   Diagnosis Date Noted  . Patellar fracture 02/03/2016  . Finger fracture, left 02/03/2016  . Leg cramps, sleep related 11/19/2015  . Absolute anemia 05/16/2014  . Primary osteoarthritis involving multiple joints 05/16/2014  . Cataract cortical, senile 05/16/2014  . Essential hypertension, benign 09/03/2012  . Edema of both legs   . Senile osteoporosis   . B12 deficiency due to diet    Past Medical History:  Diagnosis Date  . Anemia, unspecified   . B12 deficiency due to diet   . Cramp of limb   . Edema of both legs   . Encounter for long-term (current) use of other medications   . Lumbago   . Obesity   . Osteoarthrosis, unspecified whether generalized or localized, unspecified site   . Restless legs syndrome (RLS)   . Senile osteoporosis    Family History  Problem Relation Age of Onset  . Cancer Sister   . Cancer Brother    Past Surgical History:  Procedure Laterality Date  . CATARACT EXTRACTION W/ INTRAOCULAR LENS  IMPLANT, BILATERAL  04/2014  . [place]  2005  . STOMACH SURGERY  1981   stapleing    Social History   Occupational History  . retired Administrator     Social History Main Topics  . Smoking status: Former Smoker    Quit date: 05/22/1975  . Smokeless tobacco: Never Used     Comment: Quit in early 61's  . Alcohol use No  . Drug use: No  . Sexual activity: Not on file

## 2016-02-03 NOTE — ED Notes (Signed)
Ortho tech paged  

## 2016-02-03 NOTE — ED Notes (Signed)
8 mg ODT Zofran (2 tabs) administered from verbal order by S. Dowless, PA.

## 2016-02-03 NOTE — Discharge Instructions (Signed)
Take home Norco with oxycodone every 4 -6 hours for pain WITH FOOD. Take flexeril as prescribed and take ibuprofen every 6-8 hours for inflammation. Keep knee in immobilizer. Follow up with orthopedics as instructed.

## 2016-02-03 NOTE — ED Triage Notes (Signed)
Pt states that she fell two days ago, went to see her ortho doctor yesterday and again today, pt has had xray and CT done, told L knee cap is broken, pt states she is unable to control her pain at home.

## 2016-02-03 NOTE — ED Provider Notes (Signed)
[facility] DEPT Provider Note   CSN: [id number] Arrival date & time: 02/03/16  1940  By signing my name below, I, Camillo Flaming, attest that this documentation has been prepared under the direction and in the presence of [facility] PA-C. Electronically Signed: Camillo Flaming, Scribe. 02/03/16. 8:49 PM.  History   Chief Complaint Chief Complaint  Patient presents with  . Knee Pain    The history is provided by the patient. No language interpreter was used.    HPI Comments: Jody [name] is a 75 y.o. female who presents to the Emergency Department complaining of constant, unchanged left knee pain for the past 2 days. Pt reports she fell two night ago at home and was seen by her orthopedist today who diagnosed her with a left patellar fracture. She notes she cannot sleep well, walk without pain, or straighten the left due to the pain. She states the pain is worse with any movement. Patient was given a knee immobilizer which she states she was unable to wear due to the pain it caused her. She was prescribed hydrocodone 5-325 for pain control; states she took 2 doses today without any relief. She states there is no plan for surgery and she has a follow up ortho appointment next week. Per family, her husband is her main caretaker.  Past Medical History:  Diagnosis Date  . Anemia, unspecified   . B12 deficiency due to diet   . Cramp of limb   . Edema of both legs   . Encounter for long-term (current) use of other medications   . Lumbago   . Obesity   . Osteoarthrosis, unspecified whether generalized or localized, unspecified site   . Restless legs syndrome (RLS)   . Senile osteoporosis     Patient Active Problem List   Diagnosis Date Noted  . Patellar fracture 02/03/2016  . Finger fracture, left 02/03/2016  . Leg cramps, sleep related 11/19/2015  . Absolute anemia 05/16/2014  . Primary osteoarthritis involving multiple joints 05/16/2014  . Cataract cortical, senile 05/16/2014  .  Essential hypertension, benign 09/03/2012  . Edema of both legs   . Senile osteoporosis   . B12 deficiency due to diet     Past Surgical History:  Procedure Laterality Date  . CATARACT EXTRACTION W/ INTRAOCULAR LENS  IMPLANT, BILATERAL  04/2014  . [place]  2005  . [place]   stapleing     OB History    No data available       Home Medications    Prior to Admission medications   Medication Sig Start Date End Date Taking? Authorizing Provider  Calcium Carb-Cholecalciferol (CALCIUM + D3) 600-200 MG-UNIT TABS Take one tablet three times daily    Historical Provider, MD  celecoxib (CELEBREX) 100 MG capsule Take 1 capsule (100 mg total) by mouth daily. 12/11/14   Lauree Chandler, NP  Cholecalciferol (VITAMIN D) 2000 UNITS tablet Take 2,000 Units by mouth daily.    Historical Provider, MD  Coenzyme Q10 100 MG capsule Take 1 capsule (100 mg total) by mouth daily. 05/22/15   Tiffany L Reed, DO  diphenhydramine-acetaminophen (TYLENOL PM) 25-500 MG TABS Take 1 tablet by mouth at bedtime as needed.    Historical Provider, MD  Elastic Bandages & Supports (KNEE BRACE) MISC Dispense 1 Bledsoe Type hinged knee brace set for extension to 20degrees and flexion to 40degrees.  (20 degrees total ROM).  Dx Patellar Fracture 02/03/16   Gerda Diss, DO  HYDROcodone-acetaminophen (  NORCO) 5-325 MG tablet Take 1 tablet by mouth every 6 (six) hours as needed. 02/03/16   Gerda Diss, DO  Iron 66 MG TABS Take one tablet once daily    Historical Provider, MD  lisinopril (PRINIVIL,ZESTRIL) 5 MG tablet TAKE 1 TABLET DAILY (IF YOUR BLOOD PRESSURE IS OVER 140/90, TAKE AN ADDITIONAL 5 MG TABLET) 11/13/15   Tiffany L Reed, DO  magnesium oxide (MAG-OX) 400 MG tablet Take 400 mg by mouth. Take one by mouth daily    Historical Provider, MD  Misc. Devices (FOLDING WALKER) MISC 1 Units by Does not apply route once. Dispense one front wheel rolling walker. Indication patellar fracture duration of need  indefinite 02/03/16 02/03/16  Gerda Diss, DO  Misc. Devices [facility]) MISC 1 light weight transport wheelchair Dx Patellar Fracture 02/03/16   Gerda Diss, DO  Multiple Vitamin (MULTIVITAMIN) tablet Take 1 tablet by mouth daily.    Historical Provider, MD  oxyCODONE-acetaminophen (PERCOCET) 5-325 MG tablet Take 1 tablet by mouth every 6 (six) hours as needed for severe pain. 02/03/16   Gerda Diss, DO  Potassium 99 MG TABS Take 99 mg by mouth. Take one by mouth daily    Historical Provider, MD  rOPINIRole (REQUIP) 0.25 MG tablet Take 1 tablet (0.25 mg total) by mouth at bedtime. Patient not taking: Reported on 02/03/2016 11/19/15   Tiffany L Reed, DO  VITAMIN B COMPLEX-C PO Take 1,000 mg by mouth. Take one tablet by mouth daily    Historical Provider, MD    Family History Family History  Problem Relation Age of Onset  . Cancer Sister   . Cancer Brother     Social History Social History  Substance Use Topics  . Smoking status: Former Smoker    Quit date: 05/22/1975  . Smokeless tobacco: Never Used     Comment: Quit in early 73's  . Alcohol use No     Allergies   Prolia [denosumab]; Percocet [oxycodone-acetaminophen]; and Shrimp [shellfish allergy]   Review of Systems Review of Systems 10 Systems reviewed and all are negative for acute change except as noted in the HPI.  Physical Exam Updated Vital Signs BP 134/60 (BP Location: Right Arm)   Pulse 97   Temp 99.6 F (37.6 C) (Oral)   Resp 18   Ht 5\' 1"  (1.549 m)   Wt 170 lb (77.1 kg)   SpO2 98%   BMI 32.12 kg/m   Physical Exam  Constitutional: She is oriented to person, place, and time. She appears well-developed and well-nourished. No distress.  HENT:  Head: Normocephalic and atraumatic.  Eyes: Conjunctivae are normal. Right eye exhibits no discharge. Left eye exhibits no discharge. No scleral icterus.  Cardiovascular: Normal rate.   Pulmonary/Chest: Effort normal.  Musculoskeletal:  Severe TTP over  anterior left knee and with any movement. Obvious swelling.   Neurological: She is alert and oriented to person, place, and time. Coordination normal.  Skin: Skin is warm and dry. No rash noted. She is not diaphoretic. No erythema. No pallor.  Psychiatric: She has a normal mood and affect. Her behavior is normal.  Nursing note and vitals reviewed.    ED Treatments / Results  DIAGNOSTIC STUDIES: Oxygen Saturation is 98% on RA, normal by my interpretation.    COORDINATION OF CARE: 8:49 PM Discussed treatment plan with pt at bedside and pt agreed to plan.   Labs (all labs ordered are listed, but only abnormal results are displayed) Labs Reviewed - No  data to display  EKG  EKG Interpretation None       Radiology Ct Knee Left Wo Contrast  Result Date: 02/02/2016 CLINICAL DATA:  Left knee pain and swelling.  [place] 02/01/2016. EXAM: CT OF THE radiographs 02/02/2016 KNEE WITHOUT CONTRAST TECHNIQUE: Multidetector CT imaging of the left knee was performed according to the standard protocol. Multiplanar CT image reconstructions were also generated. COMPARISON:  Outside radiographs 02/02/2016 FINDINGS: Bones/Joint/Cartilage As demonstrated on the radiographs there is a mildly displaced fracture involving the inferior 1/3 of the patella. Maximum distraction is 3.5 mm. The femur, tibia and fibula are intact. No definite fractures. There is a moderate-sized high attenuation joint effusion consistent with a hemarthrosis. Ligaments Suboptimally assessed by CT. Muscles and Tendons No obvious muscle tear. The quadriceps and patellar tendons are intact. The medial and lateral collateral ligaments are grossly normal. Soft tissues Superficial soft tissue swelling noted over the patella. There is also a small calcification noted in the soft tissues. IMPRESSION: Mildly displaced transverse fracture involving the midpole lower pole junction region of the patella with 3.5 mm of distraction. No other fractures are  identified. Associated lipohemarthrosis. Electronically Signed   By: Marijo Sanes M.D.   On: 02/02/2016 13:39   Xr Knee 3 View Left  Result Date: 02/03/2016 Findings: Well aligned knee. There is a radiolucency within the patella that is incompletely characterized. Soft tissue swelling is present. No obvious tibial plateau fracture. Lateral compartment moderate osteoarthritis Impression: Incompletely characterized patellar lesion concernin for potential patellar fracture with underlying lateral compartment osteoarthritis.   Procedures Procedures (including critical care time)  Medications Ordered in ED Medications - No data to display   Initial Impression / Assessment and Plan / ED Course  I have reviewed the triage vital signs and the nursing notes.  Pertinent labs & imaging results that were available during my care of the patient were reviewed by me and considered in my medical decision making (see chart for details).  Clinical Course     75 year old female with known patella fracture, recently evaluated by orthopedic present to the ED today complaining of uncontrolled pain and left knee. Patient has had 2 doses of home Vicodin which states did not help her pain. Patient was evaluated 2 days ago by or so and they did not give her any pain medication while in their care and attempted to place an immobilizer. Patient states he was weighed to painful so she has not been able to wear it. While in ED patient simply has an Ace wrap around her knee. This is likely contributing to her pain as her broken patellar fragments are likely moving. I have given her IM Dilaudid which significantly improved her pain and was able to place a knee immobilizer without difficulty. This also continue to improve her pain. Provide adjunct oxycodone without Tylenol for patients take as needed. Patient is also likely having muscle spasms so we'll give very low-dose Flexeril. Patient also needs scheduled  anti-inflammatories which I have discussed with her. I've also had case management consult patient to arrange for home health care to help patient while at home. Patient has scheduled follow-up with orthopedics next week. Strict return precautions given and discussed.  Case discussed with Dr. Ellender Hose who agrees with treatment plan.  Final Clinical Impressions(s) / ED Diagnoses   Final diagnoses:  Acute pain of left knee    New Prescriptions New Prescriptions   No medications on file  I personally performed the services described in this documentation, which  was scribed in my presence. The recorded information has been reviewed and is accurate.      Dondra Spry [place], PA-C 02/06/16 0131    Duffy Bruce, MD 02/06/16 [phone]

## 2016-02-03 NOTE — Progress Notes (Signed)
Orthopedic Tech Progress Note Patient Details:  Jody [name] 1941-02-05 [id number]  Ortho Devices Type of Ortho Device: Knee Immobilizer, Ace wrap Ortho Device/Splint Location: Bulky jones dressing to Lt knee, Knee Immobilzer Lt Knee Ortho Device/Splint Interventions: Application, Ordered   Charlott Rakes 02/03/2016, 11:21 PM

## 2016-02-05 ENCOUNTER — Telehealth: Payer: Self-pay | Admitting: *Deleted

## 2016-02-05 ENCOUNTER — Telehealth (INDEPENDENT_AMBULATORY_CARE_PROVIDER_SITE_OTHER): Payer: Self-pay | Admitting: Radiology

## 2016-02-05 ENCOUNTER — Encounter (INDEPENDENT_AMBULATORY_CARE_PROVIDER_SITE_OTHER): Payer: Self-pay | Admitting: Sports Medicine

## 2016-02-05 NOTE — Telephone Encounter (Signed)
See message concerning an order for a wheelchair. Please advise. Thank You

## 2016-02-05 NOTE — Telephone Encounter (Signed)
This has been done and should have been faxed automatically

## 2016-02-05 NOTE — Telephone Encounter (Signed)
EDCM returned call to pt related to home health services. She was expecting [facility] to call with start of services information and deliver DME.  EDCM reviewed chart to find that orders were written fpr HH and DME.  [place] contacted Edwinna Areola, RN of [facility] to inquire about start of service.  Santiago Glad did not show pt as active with [facility] but will initiate services promptly.  Santiago Glad will also notify DME staff to have DME delivered.  EDCM contacted pt to inform her that [facility] will be calling soon with details of start of services and DME delivery.  Pt appreciative.

## 2016-02-05 NOTE — Telephone Encounter (Signed)
Talked with Gwynne Edinger and advised her that order was faxed to [facility] at [phone].

## 2016-02-05 NOTE — Telephone Encounter (Signed)
Lucky with [facility] called and needs a order for a wheelchair faxed to them at [phone]. She would like to have confirmation when this is done so she can call patient. Lucky's number is [phone] ext [phone]. Thanks.

## 2016-02-06 DIAGNOSIS — M159 Polyosteoarthritis, unspecified: Secondary | ICD-10-CM | POA: Diagnosis not present

## 2016-02-06 DIAGNOSIS — S62657D Nondisplaced fracture of medial phalanx of left little finger, subsequent encounter for fracture with routine healing: Secondary | ICD-10-CM | POA: Diagnosis not present

## 2016-02-06 DIAGNOSIS — M810 Age-related osteoporosis without current pathological fracture: Secondary | ICD-10-CM | POA: Diagnosis not present

## 2016-02-06 DIAGNOSIS — M25062 Hemarthrosis, left knee: Secondary | ICD-10-CM | POA: Diagnosis not present

## 2016-02-06 DIAGNOSIS — I10 Essential (primary) hypertension: Secondary | ICD-10-CM | POA: Diagnosis not present

## 2016-02-06 DIAGNOSIS — S82032D Displaced transverse fracture of left patella, subsequent encounter for closed fracture with routine healing: Secondary | ICD-10-CM | POA: Diagnosis not present

## 2016-02-06 DIAGNOSIS — G2581 Restless legs syndrome: Secondary | ICD-10-CM | POA: Diagnosis not present

## 2016-02-06 DIAGNOSIS — Z87891 Personal history of nicotine dependence: Secondary | ICD-10-CM | POA: Diagnosis not present

## 2016-02-06 DIAGNOSIS — D509 Iron deficiency anemia, unspecified: Secondary | ICD-10-CM | POA: Diagnosis not present

## 2016-02-08 DIAGNOSIS — M159 Polyosteoarthritis, unspecified: Secondary | ICD-10-CM | POA: Diagnosis not present

## 2016-02-08 DIAGNOSIS — S62657D Nondisplaced fracture of medial phalanx of left little finger, subsequent encounter for fracture with routine healing: Secondary | ICD-10-CM | POA: Diagnosis not present

## 2016-02-08 DIAGNOSIS — M25062 Hemarthrosis, left knee: Secondary | ICD-10-CM | POA: Diagnosis not present

## 2016-02-08 DIAGNOSIS — D509 Iron deficiency anemia, unspecified: Secondary | ICD-10-CM | POA: Diagnosis not present

## 2016-02-08 DIAGNOSIS — S82032D Displaced transverse fracture of left patella, subsequent encounter for closed fracture with routine healing: Secondary | ICD-10-CM | POA: Diagnosis not present

## 2016-02-08 DIAGNOSIS — M810 Age-related osteoporosis without current pathological fracture: Secondary | ICD-10-CM | POA: Diagnosis not present

## 2016-02-09 DIAGNOSIS — M25062 Hemarthrosis, left knee: Secondary | ICD-10-CM | POA: Diagnosis not present

## 2016-02-09 DIAGNOSIS — S82032D Displaced transverse fracture of left patella, subsequent encounter for closed fracture with routine healing: Secondary | ICD-10-CM | POA: Diagnosis not present

## 2016-02-09 DIAGNOSIS — M810 Age-related osteoporosis without current pathological fracture: Secondary | ICD-10-CM | POA: Diagnosis not present

## 2016-02-09 DIAGNOSIS — S62657D Nondisplaced fracture of medial phalanx of left little finger, subsequent encounter for fracture with routine healing: Secondary | ICD-10-CM | POA: Diagnosis not present

## 2016-02-09 DIAGNOSIS — M159 Polyosteoarthritis, unspecified: Secondary | ICD-10-CM | POA: Diagnosis not present

## 2016-02-09 DIAGNOSIS — D509 Iron deficiency anemia, unspecified: Secondary | ICD-10-CM | POA: Diagnosis not present

## 2016-02-10 ENCOUNTER — Ambulatory Visit (INDEPENDENT_AMBULATORY_CARE_PROVIDER_SITE_OTHER): Payer: Medicare Other

## 2016-02-10 ENCOUNTER — Ambulatory Visit (INDEPENDENT_AMBULATORY_CARE_PROVIDER_SITE_OTHER): Payer: Medicare Other | Admitting: Sports Medicine

## 2016-02-10 ENCOUNTER — Encounter (INDEPENDENT_AMBULATORY_CARE_PROVIDER_SITE_OTHER): Payer: Self-pay | Admitting: Sports Medicine

## 2016-02-10 VITALS — BP 136/72 | HR 84 | Ht 61.0 in | Wt 170.0 lb

## 2016-02-10 DIAGNOSIS — M810 Age-related osteoporosis without current pathological fracture: Secondary | ICD-10-CM | POA: Diagnosis not present

## 2016-02-10 DIAGNOSIS — S82031D Displaced transverse fracture of right patella, subsequent encounter for closed fracture with routine healing: Secondary | ICD-10-CM | POA: Diagnosis not present

## 2016-02-10 DIAGNOSIS — M159 Polyosteoarthritis, unspecified: Secondary | ICD-10-CM | POA: Diagnosis not present

## 2016-02-10 DIAGNOSIS — S62657D Nondisplaced fracture of medial phalanx of left little finger, subsequent encounter for fracture with routine healing: Secondary | ICD-10-CM | POA: Diagnosis not present

## 2016-02-10 DIAGNOSIS — S82032D Displaced transverse fracture of left patella, subsequent encounter for closed fracture with routine healing: Secondary | ICD-10-CM | POA: Diagnosis not present

## 2016-02-10 DIAGNOSIS — D509 Iron deficiency anemia, unspecified: Secondary | ICD-10-CM | POA: Diagnosis not present

## 2016-02-10 DIAGNOSIS — M25062 Hemarthrosis, left knee: Secondary | ICD-10-CM | POA: Diagnosis not present

## 2016-02-10 MED ORDER — SENNA-DOCUSATE SODIUM 8.6-50 MG PO TABS: 2.0000 | ORAL_TABLET | Freq: Two times a day (BID) | ORAL | 1 refills | Status: DC

## 2016-02-10 MED ORDER — HYDROCODONE-ACETAMINOPHEN 5-325 MG PO TABS: 1.0000 | ORAL_TABLET | Freq: Four times a day (QID) | ORAL | 0 refills | Status: DC | PRN

## 2016-02-10 NOTE — Progress Notes (Signed)
Jody [name] - 75 y.o. female MRN [id number]  Date of birth: 1940/10/03  Office Visit Note: Visit Date: 02/10/2016 PCP: Hollace Kinnier, DO Referred by: Gayland Curry, DO  Subjective: Chief Complaint  Patient presents with  . Left Knee - Follow-up  . Follow-up    Patient states 1 week follow-up.  States left knee is feeling better than it was.  Went to ER on Wednesday night.  Wearing Left knee immobilizer.   HPI: Patient presents for one-week follow-up of left patellar fracture. Unfortunately she had severe pain after our office visit last week & she ended up in the emergency department requiring IV pain medications. Part of this was due to the fact that she was unable to tolerate placement of the knee immobilizer & she was unable to obtain the Bledsoe brace previously prescribed. She has had continued discomfort with the knee immobilizer but is markedly improved pain with this. She is taking pain medications as well as ibuprofen on top of the chronic Celebrex she is already on.  Finger pain is significantly improving. She's no longer requiring buddy taping. She does have a walker & wheelchair at home. Home health occupational therapy is coming out later today.     ROS Otherwise per HPI.  Assessment & Plan: Visit Diagnoses:  1. Closed displaced transverse fracture of right patella with routine healing, subsequent encounter     Plan: Findings:  We'll long discussion today regarding expected clinical course. Absolutely emphasized importance of immobilization with either the knee immobilizer or Bledsoe brace but she should remain in this at all times except for while icing or resting on the couch in a straight position. Pain medication prescription refill today & I am okay with her putting weight down on this however she should not bend her knee more than 20 once she is in the Bledsoe brace. Otherwise straight leg & walking in the knee immobilizer is appropriate. X-rays today are stable  from prior CT in he will continue to keep a close eye on her with repeat x-rays after Thanksgiving.  Senokot for constipation & she has not had a bowel movement in several days. If no bowel movement by Friday she will call if to discuss further treatment/need for evaluation.  She will be dropping off paperwork needed to be completed for providing medical justification for canceling her trip to the [place] as well as to [place] next year..    Meds & Orders:  Meds ordered this encounter  Medications  . HYDROcodone-acetaminophen (NORCO) 5-325 MG tablet    Sig: Take 1 tablet by mouth every 6 (six) hours as needed.    Dispense:  60 tablet    Refill:  0  . sennosides-docusate sodium (SENOKOT-S) 8.6-50 MG tablet    Sig: Take 2 tablets by mouth 2 (two) times daily.    Dispense:  30 tablet    Refill:  1    Orders Placed This Encounter  Procedures  . XR Knee 1-2 Views Right    Follow-up: Return in 12 days (on 02/22/2016) for repeat X-rays, repeat clinical exam.   Procedures: No procedures performed  No notes on file   Clinical History: No specialty comments available.  She reports that she quit smoking about 40 years ago. She has never used smokeless tobacco. No results for input(s): HGBA1C, LABURIC in the last 8760 hours.  Objective:  VS:  HT:5\' 1"  (154.9 cm)   WT:170 lb (77.1 kg)  BMI:32.2    BP:136/72  HR:84bpm  TEMP: ( )  RESP:  Physical Exam  Ortho Exam  Adult female. No acute distress. Alert & appropriate. Her pain is much better controlled today & she does seem to be much more conversant & understanding of the clinical picture. Left knee remains & it knee immobilizer she is able to bend to approximately 20 with only minimal discomfort. She has a moderate effusion today. Focal tenderness over the patella. No significant calf pain & calf is supple. 1+ pitting edema in the left lower extremity in general. Imaging: Ct Knee Left Wo Contrast  Result Date:  02/02/2016 CLINICAL DATA:  Left knee pain and swelling.  [place] 02/01/2016. EXAM: CT OF THE radiographs 02/02/2016 KNEE WITHOUT CONTRAST TECHNIQUE: Multidetector CT imaging of the left knee was performed according to the standard protocol. Multiplanar CT image reconstructions were also generated. COMPARISON:  Outside radiographs 02/02/2016 FINDINGS: Bones/Joint/Cartilage As demonstrated on the radiographs there is a mildly displaced fracture involving the inferior 1/3 of the patella. Maximum distraction is 3.5 mm. The femur, tibia and fibula are intact. No definite fractures. There is a moderate-sized high attenuation joint effusion consistent with a hemarthrosis. Ligaments Suboptimally assessed by CT. Muscles and Tendons No obvious muscle tear. The quadriceps and patellar tendons are intact. The medial and lateral collateral ligaments are grossly normal. Soft tissues Superficial soft tissue swelling noted over the patella. There is also a small calcification noted in the soft tissues. IMPRESSION: Mildly displaced transverse fracture involving the midpole lower pole junction region of the patella with 3.5 mm of distraction. No other fractures are identified. Associated lipohemarthrosis. Electronically Signed   By: Marijo Sanes M.D.   On: 02/02/2016 13:39   Xr Finger Little Left  Result Date: 02/03/2016 Significant osteoarthritic changes with large bone spurs throughout all IP joints & MCPs. She does have an apparent fracture of the fifth IP joint spur on the dorsal aspect.  Xr Knee 1-2 Views Right  Result Date: 02/11/2016 Findings: 2V x-ray left knee: Horizontal, comminuted inferior third of the patella fracture, otherwise stable compared prior CT. Lateral compartment degenerative change of the left knee. Impression: Stable inferior third, horizontal patellar fracture  Xr Knee 3 View Left  Result Date: 02/03/2016 Findings: Well aligned knee. There is a radiolucency within the patella that is incompletely  characterized. Soft tissue swelling is present. No obvious tibial plateau fracture. Lateral compartment moderate osteoarthritis Impression: Incompletely characterized patellar lesion concernin for potential patellar fracture with underlying lateral compartment osteoarthritis.    Past Medical/Family/Surgical/Social History: Medications & Allergies reviewed per EMR Patient Active Problem List   Diagnosis Date Noted  . Patellar fracture 02/03/2016  . Finger fracture, left 02/03/2016  . Leg cramps, sleep related 11/19/2015  . Absolute anemia 05/16/2014  . Primary osteoarthritis involving multiple joints 05/16/2014  . Cataract cortical, senile 05/16/2014  . Essential hypertension, benign 09/03/2012  . Edema of both legs   . Senile osteoporosis   . B12 deficiency due to diet    Past Medical History:  Diagnosis Date  . Anemia, unspecified   . B12 deficiency due to diet   . Cramp of limb   . Edema of both legs   . Encounter for long-term (current) use of other medications   . Lumbago   . Obesity   . Osteoarthrosis, unspecified whether generalized or localized, unspecified site   . Restless legs syndrome (RLS)   . Senile osteoporosis    Family History  Problem Relation Age of Onset  . Cancer Sister   .  Cancer Brother    Past Surgical History:  Procedure Laterality Date  . CATARACT EXTRACTION W/ INTRAOCULAR LENS  IMPLANT, BILATERAL  04/2014  . [place]  2005  . STOMACH SURGERY  1981   stapleing    Social History   Occupational History  . retired Administrator     Social History Main Topics  . Smoking status: Former Smoker    Quit date: 05/22/1975  . Smokeless tobacco: Never Used     Comment: Quit in early 83's  . Alcohol use No  . Drug use: No  . Sexual activity: Not on file

## 2016-02-10 NOTE — Patient Instructions (Signed)
You need to stay in the knee immobilizer at all times.  It is okay to transition to the new brace from [place] with the settings provided.  Weightbearing is allowed while wearing the brace.

## 2016-02-11 DIAGNOSIS — M25062 Hemarthrosis, left knee: Secondary | ICD-10-CM | POA: Diagnosis not present

## 2016-02-11 DIAGNOSIS — M159 Polyosteoarthritis, unspecified: Secondary | ICD-10-CM | POA: Diagnosis not present

## 2016-02-11 DIAGNOSIS — M810 Age-related osteoporosis without current pathological fracture: Secondary | ICD-10-CM | POA: Diagnosis not present

## 2016-02-11 DIAGNOSIS — D509 Iron deficiency anemia, unspecified: Secondary | ICD-10-CM | POA: Diagnosis not present

## 2016-02-11 DIAGNOSIS — S62657D Nondisplaced fracture of medial phalanx of left little finger, subsequent encounter for fracture with routine healing: Secondary | ICD-10-CM | POA: Diagnosis not present

## 2016-02-11 DIAGNOSIS — S82032D Displaced transverse fracture of left patella, subsequent encounter for closed fracture with routine healing: Secondary | ICD-10-CM | POA: Diagnosis not present

## 2016-02-12 DIAGNOSIS — S62657D Nondisplaced fracture of medial phalanx of left little finger, subsequent encounter for fracture with routine healing: Secondary | ICD-10-CM | POA: Diagnosis not present

## 2016-02-12 DIAGNOSIS — M159 Polyosteoarthritis, unspecified: Secondary | ICD-10-CM | POA: Diagnosis not present

## 2016-02-12 DIAGNOSIS — M25062 Hemarthrosis, left knee: Secondary | ICD-10-CM | POA: Diagnosis not present

## 2016-02-12 DIAGNOSIS — S82032D Displaced transverse fracture of left patella, subsequent encounter for closed fracture with routine healing: Secondary | ICD-10-CM | POA: Diagnosis not present

## 2016-02-12 DIAGNOSIS — M810 Age-related osteoporosis without current pathological fracture: Secondary | ICD-10-CM | POA: Diagnosis not present

## 2016-02-12 DIAGNOSIS — D509 Iron deficiency anemia, unspecified: Secondary | ICD-10-CM | POA: Diagnosis not present

## 2016-02-15 DIAGNOSIS — M25062 Hemarthrosis, left knee: Secondary | ICD-10-CM | POA: Diagnosis not present

## 2016-02-15 DIAGNOSIS — S62657D Nondisplaced fracture of medial phalanx of left little finger, subsequent encounter for fracture with routine healing: Secondary | ICD-10-CM | POA: Diagnosis not present

## 2016-02-15 DIAGNOSIS — S82032D Displaced transverse fracture of left patella, subsequent encounter for closed fracture with routine healing: Secondary | ICD-10-CM | POA: Diagnosis not present

## 2016-02-15 DIAGNOSIS — D509 Iron deficiency anemia, unspecified: Secondary | ICD-10-CM | POA: Diagnosis not present

## 2016-02-15 DIAGNOSIS — M810 Age-related osteoporosis without current pathological fracture: Secondary | ICD-10-CM | POA: Diagnosis not present

## 2016-02-15 DIAGNOSIS — M159 Polyosteoarthritis, unspecified: Secondary | ICD-10-CM | POA: Diagnosis not present

## 2016-02-16 DIAGNOSIS — M810 Age-related osteoporosis without current pathological fracture: Secondary | ICD-10-CM | POA: Diagnosis not present

## 2016-02-16 DIAGNOSIS — M25062 Hemarthrosis, left knee: Secondary | ICD-10-CM | POA: Diagnosis not present

## 2016-02-16 DIAGNOSIS — S82032D Displaced transverse fracture of left patella, subsequent encounter for closed fracture with routine healing: Secondary | ICD-10-CM | POA: Diagnosis not present

## 2016-02-16 DIAGNOSIS — M159 Polyosteoarthritis, unspecified: Secondary | ICD-10-CM | POA: Diagnosis not present

## 2016-02-16 DIAGNOSIS — D509 Iron deficiency anemia, unspecified: Secondary | ICD-10-CM | POA: Diagnosis not present

## 2016-02-16 DIAGNOSIS — S62657D Nondisplaced fracture of medial phalanx of left little finger, subsequent encounter for fracture with routine healing: Secondary | ICD-10-CM | POA: Diagnosis not present

## 2016-02-17 DIAGNOSIS — M159 Polyosteoarthritis, unspecified: Secondary | ICD-10-CM | POA: Diagnosis not present

## 2016-02-17 DIAGNOSIS — M25062 Hemarthrosis, left knee: Secondary | ICD-10-CM | POA: Diagnosis not present

## 2016-02-17 DIAGNOSIS — D509 Iron deficiency anemia, unspecified: Secondary | ICD-10-CM | POA: Diagnosis not present

## 2016-02-17 DIAGNOSIS — M810 Age-related osteoporosis without current pathological fracture: Secondary | ICD-10-CM | POA: Diagnosis not present

## 2016-02-17 DIAGNOSIS — S62657D Nondisplaced fracture of medial phalanx of left little finger, subsequent encounter for fracture with routine healing: Secondary | ICD-10-CM | POA: Diagnosis not present

## 2016-02-17 DIAGNOSIS — S82032D Displaced transverse fracture of left patella, subsequent encounter for closed fracture with routine healing: Secondary | ICD-10-CM | POA: Diagnosis not present

## 2016-02-18 DIAGNOSIS — M25062 Hemarthrosis, left knee: Secondary | ICD-10-CM | POA: Diagnosis not present

## 2016-02-18 DIAGNOSIS — D509 Iron deficiency anemia, unspecified: Secondary | ICD-10-CM | POA: Diagnosis not present

## 2016-02-18 DIAGNOSIS — M159 Polyosteoarthritis, unspecified: Secondary | ICD-10-CM | POA: Diagnosis not present

## 2016-02-18 DIAGNOSIS — M810 Age-related osteoporosis without current pathological fracture: Secondary | ICD-10-CM | POA: Diagnosis not present

## 2016-02-18 DIAGNOSIS — S82032D Displaced transverse fracture of left patella, subsequent encounter for closed fracture with routine healing: Secondary | ICD-10-CM | POA: Diagnosis not present

## 2016-02-18 DIAGNOSIS — S62657D Nondisplaced fracture of medial phalanx of left little finger, subsequent encounter for fracture with routine healing: Secondary | ICD-10-CM | POA: Diagnosis not present

## 2016-02-22 ENCOUNTER — Ambulatory Visit (INDEPENDENT_AMBULATORY_CARE_PROVIDER_SITE_OTHER): Payer: Medicare Other | Admitting: Sports Medicine

## 2016-02-22 ENCOUNTER — Ambulatory Visit (INDEPENDENT_AMBULATORY_CARE_PROVIDER_SITE_OTHER): Payer: Medicare Other

## 2016-02-22 ENCOUNTER — Encounter (INDEPENDENT_AMBULATORY_CARE_PROVIDER_SITE_OTHER): Payer: Self-pay | Admitting: Sports Medicine

## 2016-02-22 VITALS — BP 105/65 | HR 86 | Ht 61.0 in | Wt 170.0 lb

## 2016-02-22 DIAGNOSIS — M25562 Pain in left knee: Secondary | ICD-10-CM

## 2016-02-22 DIAGNOSIS — S82031D Displaced transverse fracture of right patella, subsequent encounter for closed fracture with routine healing: Secondary | ICD-10-CM

## 2016-02-22 NOTE — Progress Notes (Signed)
Jody [name] - 75 y.o. female MRN [id number]  Date of birth: [birth year]  Office Visit Note: Visit Date: 02/22/2016 PCP: Hollace Kinnier, DO Referred by: Gayland Curry, DO  Subjective: Chief Complaint  Patient presents with  . Left Knee - Follow-up  . Follow-up    Patient ambulates with a cane.  Wearing a left knee immobilizer.  States right shoulder hurts from using cane.  Currently not taking any pain medicine for her left knee.  Left knee feeling a little bit better.   HPI: Patient reports good improvement in her pain. Wearing postoperative knee brace locked at 15 to 45. Reports overall feeling significantly better than last office visit. Not taking medications on a regular basis.  Small amount of right shoulder discomfort secondary to using cane. She is no longer requiring a walker. ROS:. Denies fevers, chills, recent weight gain or weight loss.  No night sweats. No significant nighttime awakenings due to this issue.  No significant lower extremity edema. Otherwise per HPI.   Clinical History: No specialty comments available.  She reports that she quit smoking about 40 years ago. She has never used smokeless tobacco.  No results for input(s): HGBA1C, LABURIC in the last 8760 hours.  Assessment & Plan: Visit Diagnoses:  1. Acute pain of left knee   2. Closed displaced transverse fracture of right patella with routine healing, subsequent encounter    Plan: She is continuing to progress & doing quite well. We will increase her range of motion from 0 to 60. She should continue working on strengthening & avoid knee flexion. We'll plan to progress her to 90 at next visit with anticipated 4 more weeks of utilizing the knee brace. X-rays overall appear stable today with only a minimal step-off of the patellar surface. Continue with weight-bearing as tolerated with a cane. Cane was slightly adjusted today & this did provide her some improvement.   Follow-up: Return in about 2 weeks  (around 03/07/2016) for repeat X-rays, repeat clinical exam.  Meds: No orders of the defined types were placed in this encounter.  Orders:  Orders Placed This Encounter  Procedures  . XR Knee 1-2 Views Left    Procedures: No notes on file   Objective:  VS:  HT:5\' 1"  (154.9 cm)   WT:170 lb (77.1 kg)  BMI:32.2    BP:105/65  HR:86bpm  TEMP: ( )  RESP:  Physical Exam: Left knee overall well aligned. Small residual amount of swelling. Persistent focal tenderness over the anterior knee. No pain with range of motion from 5 to 60 but terminal 5 although she is able to completely extend actively. No significant lower extremity swelling. Knee brace is well fitted. Imaging: Xr Knee 1-2 Views Left  Result Date: 02/22/2016 Findings: 2V left knee: Overall well aligned.  Inferior pole of the patellar fracture is overall stable compared to prior films with only minimal step-off of the articular surface. Otherwise degenerative changes as previously noted. Impression: Stable patellar fracture in the setting of underlying degenerative change.    Past Medical/Family/Surgical/Social History: Medications & Allergies reviewed per EMR Patient Active Problem List   Diagnosis Date Noted  . Patellar fracture 02/03/2016  . Finger fracture, left 02/03/2016  . Leg cramps, sleep related 11/19/2015  . Absolute anemia 05/16/2014  . Primary osteoarthritis involving multiple joints 05/16/2014  . Cataract cortical, senile 05/16/2014  . Essential hypertension, benign 09/03/2012  . Edema of both legs   . Senile osteoporosis   . B12 deficiency due to  diet    Past Medical History:  Diagnosis Date  . Anemia, unspecified   . B12 deficiency due to diet   . Cramp of limb   . Edema of both legs   . Encounter for long-term (current) use of other medications   . Lumbago   . Obesity   . Osteoarthrosis, unspecified whether generalized or localized, unspecified site   . Restless legs syndrome (RLS)   . Senile  osteoporosis    Family History  Problem Relation Age of Onset  . Cancer Sister   . Cancer Brother    Past Surgical History:  Procedure Laterality Date  . CATARACT EXTRACTION W/ INTRAOCULAR LENS  IMPLANT, BILATERAL  04/2014  . [place]  2005  . STOMACH SURGERY  1981   stapleing    Social History   Occupational History  . retired Administrator     Social History Main Topics  . Smoking status: Former Smoker    Quit date: 05/22/1975  . Smokeless tobacco: Never Used     Comment: Quit in early 57's  . Alcohol use No  . Drug use: No  . Sexual activity: Not on file

## 2016-02-23 DIAGNOSIS — S82032D Displaced transverse fracture of left patella, subsequent encounter for closed fracture with routine healing: Secondary | ICD-10-CM | POA: Diagnosis not present

## 2016-02-23 DIAGNOSIS — M159 Polyosteoarthritis, unspecified: Secondary | ICD-10-CM | POA: Diagnosis not present

## 2016-02-23 DIAGNOSIS — D509 Iron deficiency anemia, unspecified: Secondary | ICD-10-CM | POA: Diagnosis not present

## 2016-02-23 DIAGNOSIS — M25062 Hemarthrosis, left knee: Secondary | ICD-10-CM | POA: Diagnosis not present

## 2016-02-23 DIAGNOSIS — M810 Age-related osteoporosis without current pathological fracture: Secondary | ICD-10-CM | POA: Diagnosis not present

## 2016-02-23 DIAGNOSIS — S62657D Nondisplaced fracture of medial phalanx of left little finger, subsequent encounter for fracture with routine healing: Secondary | ICD-10-CM | POA: Diagnosis not present

## 2016-02-24 DIAGNOSIS — M25062 Hemarthrosis, left knee: Secondary | ICD-10-CM | POA: Diagnosis not present

## 2016-02-24 DIAGNOSIS — M810 Age-related osteoporosis without current pathological fracture: Secondary | ICD-10-CM | POA: Diagnosis not present

## 2016-02-24 DIAGNOSIS — D509 Iron deficiency anemia, unspecified: Secondary | ICD-10-CM | POA: Diagnosis not present

## 2016-02-24 DIAGNOSIS — M159 Polyosteoarthritis, unspecified: Secondary | ICD-10-CM | POA: Diagnosis not present

## 2016-02-24 DIAGNOSIS — S62657D Nondisplaced fracture of medial phalanx of left little finger, subsequent encounter for fracture with routine healing: Secondary | ICD-10-CM | POA: Diagnosis not present

## 2016-02-24 DIAGNOSIS — S82032D Displaced transverse fracture of left patella, subsequent encounter for closed fracture with routine healing: Secondary | ICD-10-CM | POA: Diagnosis not present

## 2016-02-25 DIAGNOSIS — M25062 Hemarthrosis, left knee: Secondary | ICD-10-CM | POA: Diagnosis not present

## 2016-02-25 DIAGNOSIS — D509 Iron deficiency anemia, unspecified: Secondary | ICD-10-CM | POA: Diagnosis not present

## 2016-02-25 DIAGNOSIS — M159 Polyosteoarthritis, unspecified: Secondary | ICD-10-CM | POA: Diagnosis not present

## 2016-02-25 DIAGNOSIS — S82032D Displaced transverse fracture of left patella, subsequent encounter for closed fracture with routine healing: Secondary | ICD-10-CM | POA: Diagnosis not present

## 2016-02-25 DIAGNOSIS — S62657D Nondisplaced fracture of medial phalanx of left little finger, subsequent encounter for fracture with routine healing: Secondary | ICD-10-CM | POA: Diagnosis not present

## 2016-02-25 DIAGNOSIS — M810 Age-related osteoporosis without current pathological fracture: Secondary | ICD-10-CM | POA: Diagnosis not present

## 2016-03-02 DIAGNOSIS — L570 Actinic keratosis: Secondary | ICD-10-CM | POA: Diagnosis not present

## 2016-03-02 DIAGNOSIS — Z85820 Personal history of malignant melanoma of skin: Secondary | ICD-10-CM | POA: Diagnosis not present

## 2016-03-02 DIAGNOSIS — D492 Neoplasm of unspecified behavior of bone, soft tissue, and skin: Secondary | ICD-10-CM | POA: Diagnosis not present

## 2016-03-02 DIAGNOSIS — D239 Other benign neoplasm of skin, unspecified: Secondary | ICD-10-CM | POA: Diagnosis not present

## 2016-03-04 DIAGNOSIS — M810 Age-related osteoporosis without current pathological fracture: Secondary | ICD-10-CM | POA: Diagnosis not present

## 2016-03-04 DIAGNOSIS — S82032D Displaced transverse fracture of left patella, subsequent encounter for closed fracture with routine healing: Secondary | ICD-10-CM | POA: Diagnosis not present

## 2016-03-04 DIAGNOSIS — S62657D Nondisplaced fracture of medial phalanx of left little finger, subsequent encounter for fracture with routine healing: Secondary | ICD-10-CM | POA: Diagnosis not present

## 2016-03-04 DIAGNOSIS — D509 Iron deficiency anemia, unspecified: Secondary | ICD-10-CM | POA: Diagnosis not present

## 2016-03-04 DIAGNOSIS — M25062 Hemarthrosis, left knee: Secondary | ICD-10-CM | POA: Diagnosis not present

## 2016-03-04 DIAGNOSIS — M159 Polyosteoarthritis, unspecified: Secondary | ICD-10-CM | POA: Diagnosis not present

## 2016-03-08 ENCOUNTER — Encounter (INDEPENDENT_AMBULATORY_CARE_PROVIDER_SITE_OTHER): Payer: Self-pay | Admitting: Sports Medicine

## 2016-03-08 ENCOUNTER — Ambulatory Visit (INDEPENDENT_AMBULATORY_CARE_PROVIDER_SITE_OTHER): Payer: Medicare Other

## 2016-03-08 ENCOUNTER — Ambulatory Visit (INDEPENDENT_AMBULATORY_CARE_PROVIDER_SITE_OTHER): Payer: Medicare Other | Admitting: Sports Medicine

## 2016-03-08 VITALS — BP 125/69 | HR 91 | Ht 61.0 in | Wt 158.0 lb

## 2016-03-08 DIAGNOSIS — M25562 Pain in left knee: Secondary | ICD-10-CM | POA: Diagnosis not present

## 2016-03-08 DIAGNOSIS — S82032D Displaced transverse fracture of left patella, subsequent encounter for closed fracture with routine healing: Secondary | ICD-10-CM

## 2016-03-09 DIAGNOSIS — M159 Polyosteoarthritis, unspecified: Secondary | ICD-10-CM | POA: Diagnosis not present

## 2016-03-09 DIAGNOSIS — D509 Iron deficiency anemia, unspecified: Secondary | ICD-10-CM | POA: Diagnosis not present

## 2016-03-09 DIAGNOSIS — S62657D Nondisplaced fracture of medial phalanx of left little finger, subsequent encounter for fracture with routine healing: Secondary | ICD-10-CM | POA: Diagnosis not present

## 2016-03-09 DIAGNOSIS — M810 Age-related osteoporosis without current pathological fracture: Secondary | ICD-10-CM | POA: Diagnosis not present

## 2016-03-09 DIAGNOSIS — M25062 Hemarthrosis, left knee: Secondary | ICD-10-CM | POA: Diagnosis not present

## 2016-03-09 DIAGNOSIS — S82032D Displaced transverse fracture of left patella, subsequent encounter for closed fracture with routine healing: Secondary | ICD-10-CM | POA: Diagnosis not present

## 2016-03-09 NOTE — Progress Notes (Signed)
Jody [name] - 75 y.o. female MRN [id number]  Date of birth: 1940/04/21  Office Visit Note: Visit Date: 03/08/2016 PCP: Hollace Kinnier, DO Referred by: Gayland Curry, DO  Subjective: Chief Complaint  Patient presents with  . Left Knee - Follow-up  . Follow-up    States left knee is not the problem, states her back has flared up and now she is having  trouble picking up her feet due to lower back pain.  Wearing Donjoy brace.   HPI:  Patient is 6 weeks out from transverse patellar fracture. Knee is feeling significantly better. She does reports fairly significant back pain that results in stiffness in the morning & difficulty standing upright. She has been compliant with wearing her DonJoy knee brace & denies any symptoms of instability or significant pain with her knee. She is not taking any specific medications on a regular basis however she did have an acute flare up of her back several days ago & discontinued her Celebrex & started on ibuprofen as this seemed to be more beneficial for her.  ROS:. Denies fevers, chills, recent weight gain or weight loss.  No night sweats. No significant nighttime awakenings due to this issue.  No significant lower extremity edema. Otherwise per HPI.   Clinical History: No specialty comments available.  She reports that she quit smoking about 40 years ago. She has never used smokeless tobacco.  No results for input(s): HGBA1C, LABURIC in the last 8760 hours.  Assessment & Plan: Visit Diagnoses:  1. Acute pain of left knee   2. Closed displaced transverse fracture of left patella with routine healing    Plan: Overall she continues to show great progress. She is only mildly tender over the distal patella & has full flexion & extension. I like for her to continue using knee brace for the next 2 weeks while outside of the home but would like for her to be out of the brace working on range of motion otherwise. After 2 weeks she should discontinue the brace  unless she is having any persistent symptoms. We'll have her follow-up with Dr. Sharol Given for her convenience.  Repeat 2V x-ray at that time of the knee.   Follow-up: Return in about 4 weeks (around 04/05/2016) for follow up with Dr. Sharol Given.  Meds: No orders of the defined types were placed in this encounter.  Orders:  Orders Placed This Encounter  Procedures  . XR Knee 1-2 Views Left  . Ambulatory referral to Physical Therapy    Procedures: No notes on file   Objective:  VS:  HT:5\' 1"  (154.9 cm)   WT:158 lb (71.7 kg)  BMI:29.9    BP:125/69  HR:91bpm  TEMP: ( )  RESP:  Physical Exam: Left knee overall well aligned. Small residual amount of swelling. Only a very small amount of focal tenderness over the anterior patella. No joint line pain. She has full flexion & extension with ability to perform straight leg raise without significant is comfort or struggle.  No significant lower extremity swelling. Knee brace is well fitted. Imaging: Xr Knee 1-2 Views Left  Result Date: 03/09/2016 Findings: 2V left knee: Overall well aligned.  Inferior pole of the patellar fracture is overall stable compared to prior films with only minimal step-off of the articular surface. Otherwise degenerative changes as previously noted. Impression: Stable patellar fracture in the setting of underlying degenerative change.    Past Medical/Family/Surgical/Social History: Medications & Allergies reviewed per EMR Patient Active Problem List  Diagnosis Date Noted  . Patellar fracture 02/03/2016  . Finger fracture, left 02/03/2016  . Leg cramps, sleep related 11/19/2015  . Absolute anemia 05/16/2014  . Primary osteoarthritis involving multiple joints 05/16/2014  . Cataract cortical, senile 05/16/2014  . Essential hypertension, benign 09/03/2012  . Edema of both legs   . Senile osteoporosis   . B12 deficiency due to diet    Past Medical History:  Diagnosis Date  . Anemia, unspecified   . B12 deficiency due to  diet   . Cramp of limb   . Edema of both legs   . Encounter for long-term (current) use of other medications   . Lumbago   . Obesity   . Osteoarthrosis, unspecified whether generalized or localized, unspecified site   . Restless legs syndrome (RLS)   . Senile osteoporosis    Family History  Problem Relation Age of Onset  . Cancer Sister   . Cancer Brother    Past Surgical History:  Procedure Laterality Date  . CATARACT EXTRACTION W/ INTRAOCULAR LENS  IMPLANT, BILATERAL  04/2014  . [place]  2005  . STOMACH SURGERY  1981   stapleing    Social History   Occupational History  . retired Administrator     Social History Main Topics  . Smoking status: Former Smoker    Quit date: 05/22/1975  . Smokeless tobacco: Never Used     Comment: Quit in early 7's  . Alcohol use No  . Drug use: No  . Sexual activity: Not on file

## 2016-03-15 ENCOUNTER — Encounter (INDEPENDENT_AMBULATORY_CARE_PROVIDER_SITE_OTHER): Payer: Self-pay | Admitting: Sports Medicine

## 2016-03-15 ENCOUNTER — Ambulatory Visit (INDEPENDENT_AMBULATORY_CARE_PROVIDER_SITE_OTHER): Payer: Medicare Other | Admitting: Sports Medicine

## 2016-03-15 ENCOUNTER — Ambulatory Visit: Payer: Medicare Other | Admitting: Nurse Practitioner

## 2016-03-15 VITALS — BP 111/58 | HR 79 | Ht 61.0 in | Wt 158.0 lb

## 2016-03-15 DIAGNOSIS — S82032D Displaced transverse fracture of left patella, subsequent encounter for closed fracture with routine healing: Secondary | ICD-10-CM

## 2016-03-15 DIAGNOSIS — M4726 Other spondylosis with radiculopathy, lumbar region: Secondary | ICD-10-CM

## 2016-03-15 DIAGNOSIS — M25562 Pain in left knee: Secondary | ICD-10-CM

## 2016-03-15 MED ORDER — METHYLPREDNISOLONE 4 MG PO TBPK: ORAL_TABLET | ORAL | 0 refills | Status: DC

## 2016-03-15 MED ORDER — BACLOFEN 10 MG PO TABS
10.0000 mg | ORAL_TABLET | Freq: Three times a day (TID) | ORAL | 0 refills | Status: DC
Start: 2016-03-15 — End: 2016-04-05

## 2016-03-15 NOTE — Progress Notes (Signed)
Jody [name] - 75 y.o. female MRN [id number]  Date of birth: Nov 12, 1940  Office Visit Note: Visit Date: 03/15/2016 PCP: Hollace Kinnier, DO Referred by: Gayland Curry, DO  Subjective: Chief Complaint  Patient presents with  . Left Knee - Follow-up  . Follow-up    Patient states that left knee is swelling and hurting in a different place.  Mobility has been altered due to back pain.   HPI: Patient reports that her anterior knee pain has improved however she is having pain along the medial aspect at this time.  She has significantly increased her activities and reports feeling as though she is walking awkwardly due to the hinge knee brace.  Her back continues to be symptomatic for her and she has some pain radiating down the posterior aspect of her leg.  She is having worsening cramping at nighttime.  She does report that at the end of the day her left leg is more swollen than the left.   ROS: She denies any shortness of breath, persistent lower extremity edema hemoptysis or prior history of blood clot.. Otherwise per HPI.   Clinical History: No specialty comments available.  She reports that she quit smoking about 40 years ago. She has never used smokeless tobacco.  No results for input(s): HGBA1C, LABURIC in the last 8760 hours.  Assessment & Plan: Visit Diagnoses: No diagnosis found.  Plan: This most likely reflects a worsening of possible lumbar spondylosis.  We'll go ahead and start her on a Medrol Dosepak as well as baclofen to see if this can be beneficial for her and allow her to get through the holidays with less symptoms.  Her patellar fracture does appear to be healing as expected and will have her continue working on unrestricted range of motion but would like for her to limit her activities to worsening symptoms which she has demonstrated today.  I do think her low back would benefit from a formal exercise program and referral to physical therapy was discussed but she would like  to hold off on this at this time. Follow-up: Return for as scheduled.  Meds:  Meds ordered this encounter  Medications  . baclofen (LIORESAL) 10 MG tablet    Sig: Take 1 tablet (10 mg total) by mouth 3 (three) times daily.    Dispense:  30 each    Refill:  0  . methylPREDNISolone (MEDROL DOSEPAK) 4 MG TBPK tablet    Sig: Take by mouth as directed. Take 6 tablets on the first day prescribed then as directed.    Dispense:  21 tablet    Refill:  0   Procedures: No notes on file   Objective:  VS:  HT:5\' 1"  (154.9 cm)   WT:158 lb (71.7 kg)  BMI:29.9    BP:(!) 111/58  HR:79bpm  TEMP: ( )  RESP:  Physical Exam:  Adult female. Alert and appropriate.  In no acute distress.  Upper extremities are overall well aligned with no significant deformity. No significant swelling.  Distal pulses 2+/4. No significant bruising/ecchymosis or erythema the skin Back: She has small amount of pain with straight leg raise on the left compared to the right.  She is hyperesthetic along the S1 distribution on the left compared to the right.  Reflexes are brisk throughout bilateral lower extremities.  Only a small amount of residual pain along the anterior aspect of the patella area and otherwise she is living tremendously stable.  Only small amount of residual synovitis without  significant effusion appreciated today. Imaging: Xr Knee 1-2 Views Left  Result Date: 03/09/2016 Findings: 2V left knee: Overall well aligned.  Inferior pole of the patellar fracture is overall stable compared to prior films with only minimal step-off of the articular surface. Otherwise degenerative changes as previously noted. Impression: Stable patellar fracture in the setting of underlying degenerative change.  Xr Knee 1-2 Views Left  Result Date: 02/22/2016 Findings: 2V left knee: Overall well aligned.  Inferior pole of the patellar fracture is overall stable compared to prior films with only minimal step-off of the articular  surface. Otherwise degenerative changes as previously noted. Impression: Stable patellar fracture in the setting of underlying degenerative change.   Past Medical/Family/Surgical/Social History: Medications & Allergies reviewed per EMR Patient Active Problem List   Diagnosis Date Noted  . Patellar fracture 02/03/2016  . Finger fracture, left 02/03/2016  . Leg cramps, sleep related 11/19/2015  . Absolute anemia 05/16/2014  . Primary osteoarthritis involving multiple joints 05/16/2014  . Cataract cortical, senile 05/16/2014  . Essential hypertension, benign 09/03/2012  . Edema of both legs   . Senile osteoporosis   . B12 deficiency due to diet    Past Medical History:  Diagnosis Date  . Anemia, unspecified   . B12 deficiency due to diet   . Cramp of limb   . Edema of both legs   . Encounter for long-term (current) use of other medications   . Lumbago   . Obesity   . Osteoarthrosis, unspecified whether generalized or localized, unspecified site   . Restless legs syndrome (RLS)   . Senile osteoporosis    Family History  Problem Relation Age of Onset  . Cancer Sister   . Cancer Brother    Past Surgical History:  Procedure Laterality Date  . CATARACT EXTRACTION W/ INTRAOCULAR LENS  IMPLANT, BILATERAL  04/2014  . [place]  2005  . STOMACH SURGERY  1981   stapleing    Social History   Occupational History  . retired Administrator     Social History Main Topics  . Smoking status: Former Smoker    Quit date: 05/22/1975  . Smokeless tobacco: Never Used     Comment: Quit in early 1's  . Alcohol use No  . Drug use: No  . Sexual activity: Not on file

## 2016-03-17 ENCOUNTER — Ambulatory Visit: Payer: Medicare Other | Admitting: Internal Medicine

## 2016-04-05 ENCOUNTER — Ambulatory Visit (INDEPENDENT_AMBULATORY_CARE_PROVIDER_SITE_OTHER): Payer: Self-pay

## 2016-04-05 ENCOUNTER — Ambulatory Visit (INDEPENDENT_AMBULATORY_CARE_PROVIDER_SITE_OTHER): Payer: Medicare Other | Admitting: Family

## 2016-04-05 ENCOUNTER — Encounter (INDEPENDENT_AMBULATORY_CARE_PROVIDER_SITE_OTHER): Payer: Self-pay | Admitting: Orthopedic Surgery

## 2016-04-05 ENCOUNTER — Ambulatory Visit (INDEPENDENT_AMBULATORY_CARE_PROVIDER_SITE_OTHER): Payer: Medicare Other | Admitting: Orthopedic Surgery

## 2016-04-05 VITALS — Ht 61.0 in | Wt 158.0 lb

## 2016-04-05 DIAGNOSIS — S82032G Displaced transverse fracture of left patella, subsequent encounter for closed fracture with delayed healing: Secondary | ICD-10-CM

## 2016-04-05 DIAGNOSIS — M5417 Radiculopathy, lumbosacral region: Secondary | ICD-10-CM | POA: Diagnosis not present

## 2016-04-05 DIAGNOSIS — M5416 Radiculopathy, lumbar region: Secondary | ICD-10-CM

## 2016-04-05 MED ORDER — BACLOFEN 10 MG PO TABS: 10.0000 mg | ORAL_TABLET | Freq: Three times a day (TID) | ORAL | 0 refills | Status: DC

## 2016-04-05 NOTE — Progress Notes (Signed)
Office Visit Note   Patient: Jody [name]           Date of Birth: 1940-11-29           MRN: [id number] Visit Date: 04/05/2016              Requested by: Gayland Curry, DO [place], [place] [id number] PCP: Hollace Kinnier, DO  Chief Complaint  Patient presents with  . Lower Back - Follow-up    HPI: Patient presents today for follow up low back pain. She has history of back surgery in 1995. She has weakness. She does states steroid and baclofen have helped but she still lives with considerable amount of pain. She states the steroid only helped while she was taking it but is symptomatic after completing dose pack. She is taking celebrex in the morning and tylenol PM in the evening. She feels she is at as loss on how to manage her pain.   Patient is a 76 year old woman who presents today in follow-up for low back pain which has been ongoing for months. Constant low back pain. Worse with certain movements, such as bending from the waist. Shooting and burning pain down posterior thighs bilaterally, worse on left than right. She had been following with Dr. Paulla Fore who had recommended him a prednisone taper. States this helped a little bit while she is taking it but now complains of a considerable amount of pain. Complains at length about her inability to him improving with physical therapy. Has been working on [employer], chair yoga as well as with a physical therapist for low back pain, working on balance and quad strengthening as well for a couple months now. Taking Celebrex with minimal relief. Does take Baclofen as well periodically which as provided moderate relief of some spasm pain.     Assessment & Plan: Visit Diagnoses:  1. 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 Displaced transverse fracture of left patella, subsequent encounter for closed fracture with delayed healing     Plan: We'll get her set up for a him MRI of the L and S spine.  Follow up in office post MRI with Dr. Sharol Given.  Follow-Up Instructions: Return for p mri c duda.   Exam: Patient is alert and oriented. No adenopathy. Well-dressed. Normal affect. Respirations easy.  Standing through out exam for comfort. Points to bilateral buttock/SI joints as most painful areas. Back Exam   Tenderness  The patient is experiencing tenderness in the lumbar and sacroiliac.  Range of Motion  The patient has normal back ROM.  Muscle Strength  The patient has normal back strength.  Tests  Straight leg raise right: negative Straight leg raise left: negative  Other  Sensation: normal Gait: normal       Imaging: No results found.  Orders:  Orders Placed This Encounter  Procedures  . MR LUMBAR SPINE WO CONTRAST   Meds ordered this encounter  Medications  . baclofen (LIORESAL) 10 MG tablet    Sig: Take 1 tablet (10 mg total) by mouth 3 (three) times daily.    Dispense:  30 each    Refill:  0     Procedures: No procedures performed  Clinical Data: No additional findings.  Subjective: Review of Systems  Constitutional: Negative for chills and fever.  Musculoskeletal: Positive for back pain and myalgias.    Objective: Vital Signs: Ht 5\' 1"  (1.549 m)   Wt 158 lb (71.7 kg)  BMI 29.85 kg/m   Specialty Comments:  No specialty comments available.  PMFS History: Patient Active Problem List   Diagnosis Date Noted  . Patellar fracture 02/03/2016  . Finger fracture, left 02/03/2016  . Leg cramps, sleep related 11/19/2015  . Absolute anemia 05/16/2014  . Primary osteoarthritis involving multiple joints 05/16/2014  . Cataract cortical, senile 05/16/2014  . Essential hypertension, benign 09/03/2012  . Edema of both legs   . Senile osteoporosis   . B12 deficiency due to diet    Past Medical History:  Diagnosis Date  . Anemia, unspecified   . B12 deficiency due to diet   . Cramp of limb   . Edema of both legs   . Encounter for long-term  (current) use of other medications   . Lumbago   . Obesity   . Osteoarthrosis, unspecified whether generalized or localized, unspecified site   . Restless legs syndrome (RLS)   . Senile osteoporosis     Family History  Problem Relation Age of Onset  . Cancer Sister   . Cancer Brother     Past Surgical History:  Procedure Laterality Date  . CATARACT EXTRACTION W/ INTRAOCULAR LENS  IMPLANT, BILATERAL  04/2014  . [place]  2005  . STOMACH SURGERY  1981   stapleing    Social History   Occupational History  . retired Administrator     Social History Main Topics  . Smoking status: Former Smoker    Quit date: 05/22/1975  . Smokeless tobacco: Never Used     Comment: Quit in early 4's  . Alcohol use No  . Drug use: No  . Sexual activity: Not on file

## 2016-04-07 ENCOUNTER — Telehealth: Payer: Self-pay | Admitting: *Deleted

## 2016-04-07 NOTE — Telephone Encounter (Signed)
-----   Message from Gayland Curry, DO sent at 04/07/2016  8:16 AM EST ----- Can you please call Ms. Kampman and get her in to see me soon?  She's had a fall and patellar fx, had surgery, rehab, etc and I have yet to see her to get caught up.  Now she's having more back pain.  I'm concerned she's going to get lost in the shuffle.   Thx, TReed ----- Message ----- From: Newt Minion, MD Sent: 04/06/2016   8:24 AM To: Gayland Curry, DO

## 2016-04-07 NOTE — Telephone Encounter (Signed)
Spoke with patient and advised results, appt scheduled 04/11/16

## 2016-04-07 NOTE — Telephone Encounter (Signed)
.  left message to have patient return my call.  

## 2016-04-11 ENCOUNTER — Telehealth (INDEPENDENT_AMBULATORY_CARE_PROVIDER_SITE_OTHER): Payer: Self-pay | Admitting: *Deleted

## 2016-04-11 ENCOUNTER — Ambulatory Visit (INDEPENDENT_AMBULATORY_CARE_PROVIDER_SITE_OTHER): Payer: Medicare Other | Admitting: Internal Medicine

## 2016-04-11 ENCOUNTER — Encounter: Payer: Self-pay | Admitting: Internal Medicine

## 2016-04-11 ENCOUNTER — Ambulatory Visit
Admission: RE | Admit: 2016-04-11 | Discharge: 2016-04-11 | Disposition: A | Discharge status: Home / Self Care | Payer: Medicare Other | Source: Ambulatory Visit | Attending: Internal Medicine | Admitting: Internal Medicine

## 2016-04-11 VITALS — BP 160/70 | HR 73 | Temp 98.1°F | Wt 164.0 lb

## 2016-04-11 DIAGNOSIS — M545 Low back pain, unspecified: Secondary | ICD-10-CM

## 2016-04-11 DIAGNOSIS — S82092A Other fracture of left patella, initial encounter for closed fracture: Secondary | ICD-10-CM | POA: Diagnosis not present

## 2016-04-11 DIAGNOSIS — S82002A Unspecified fracture of left patella, initial encounter for closed fracture: Secondary | ICD-10-CM | POA: Diagnosis not present

## 2016-04-11 DIAGNOSIS — I10 Essential (primary) hypertension: Secondary | ICD-10-CM | POA: Diagnosis not present

## 2016-04-11 DIAGNOSIS — M5416 Radiculopathy, lumbar region: Secondary | ICD-10-CM | POA: Insufficient documentation

## 2016-04-11 MED ORDER — BACLOFEN 10 MG PO TABS: 10.0000 mg | ORAL_TABLET | Freq: Every evening | ORAL | 0 refills | Status: DC | PRN

## 2016-04-11 MED ORDER — GABAPENTIN 100 MG PO CAPS: 100.0000 mg | ORAL_CAPSULE | Freq: Three times a day (TID) | ORAL | 3 refills | Status: DC

## 2016-04-11 MED ORDER — LISINOPRIL 5 MG PO TABS: ORAL_TABLET | ORAL | 1 refills | Status: DC

## 2016-04-11 NOTE — Patient Instructions (Signed)
Take gabapentin 100mg  three times per day.   You may also try using salonpas patches with lidocaine--cut in 1/2 and place one half on each buttock at night.   You may still use some aleve if you need it for breakthrough pain.

## 2016-04-11 NOTE — Telephone Encounter (Signed)
Pt called back and aware of appt 

## 2016-04-11 NOTE — Progress Notes (Signed)
Location:  [facility] clinic Provider: Kristoff Coonradt L. Mariea Clonts, D.O., C.M.D.  Code Status: DNR Goals of Care:  Advanced Directives 04/11/2016  Does Patient Have a Medical Advance Directive? Yes  Type of Advance Directive [place]  Does patient want to make changes to medical advance directive? -  Copy of [place] in Chart? Yes    Chief Complaint  Patient presents with  . Acute Visit    back pain    HPI: Patient is a 77 y.o. female seen today for an acute visit for back pain.  I had asked that she come in because she had several problems recently and I had not seen her.  She had been gone for 9 weeks and came home 11/3.  She fell across her suitcase in the middle of the floor.  Faceplanted over the suitcase and hurt her knee.  Had excruciating pain and climbed into the bed.  The next am, she didn't know what to do.  She called ortho and got appt.  Saw Dr. Paulla Fore and there was fluid.  She had xrays, got fluid drained.  He made an attempt to immobilize her knee with a brace.  She could not bear the pain with the brace.  Had MRI done.  Went home due to severe pain.  Next day, goes back to Dr. Paulla Fore.  Advised to put brace back on.  That night, she went to the ED--got a shot for pain, pain meds, home healthcare.  Says she should have gone to the hospital right away.  Gradually, it has gotten better.    Wanted a steroid dose pack for her back.  She was using the immobilizer and walking some.  She is now having terrible pain in her lower back and down both legs.  She could hardly pick her legs up off of the floor.   Was taking tylenol, but inflammation not helped so taking otc ibuprofen.  Was not taking her celebrex anymore.  Now taking aleve, but she's not better or well.  Thinks her knee is healed.  When she went back to the practice, she waited and had to leave to pick up a grandchild.    She is back to swimming, doing chair yoga and attending a balance class.  She is in  a lot of pain.  PT was not recommended at this point.  Muscle relaxer got renewed by the PA.  It took until today for her to get the MRI appt.  She rescheduled it to tomorrow at 5pm.    Taking three aleve gelcaps per day some days.  Using muscle relaxer.    BP has been ok at home.  Elevated here today, but her heat has gone out and she is all worked up and agitated about her recent events and taking nsaids.  Past Medical History:  Diagnosis Date  . Anemia, unspecified   . B12 deficiency due to diet   . Cramp of limb   . Edema of both legs   . Encounter for long-term (current) use of other medications   . Lumbago   . Obesity   . Osteoarthrosis, unspecified whether generalized or localized, unspecified site   . Restless legs syndrome (RLS)   . Senile osteoporosis     Past Surgical History:  Procedure Laterality Date  . CATARACT EXTRACTION W/ INTRAOCULAR LENS  IMPLANT, BILATERAL  04/2014  . [place]  2005  . [place]  Allergies  Allergen Reactions  . Prolia [Denosumab] Other (See Comments)    Chest pain  . Percocet [Oxycodone-Acetaminophen]     Confusion   . Shrimp [Shellfish Allergy]     All seafood    Allergies as of 04/11/2016      Reactions   Prolia [denosumab] Other (See Comments)   Chest pain   Percocet [oxycodone-acetaminophen]    Confusion   Shrimp [shellfish Allergy]    All seafood      Medication List       Accurate as of 04/11/16 12:01 PM. Always use your most recent med list.          baclofen 10 MG tablet Commonly known as:  LIORESAL Take 1 tablet (10 mg total) by mouth 3 (three) times daily.   Calcium + D3 600-200 MG-UNIT Tabs Take one tablet three times daily   celecoxib 100 MG capsule Commonly known as:  CELEBREX Take 1 capsule (100 mg total) by mouth daily.   Coenzyme Q10 100 MG capsule Take 1 capsule (100 mg total) by mouth daily.   diphenhydramine-acetaminophen 25-500 MG Tabs tablet Commonly known  as:  TYLENOL PM Take 1 tablet by mouth at bedtime as needed.   Iron 66 MG Tabs Take one tablet once daily   lisinopril 5 MG tablet Commonly known as:  PRINIVIL,ZESTRIL TAKE 1 TABLET DAILY (IF YOUR BLOOD PRESSURE IS OVER 140/90, TAKE AN ADDITIONAL 5 MG TABLET)   magnesium oxide 400 MG tablet Commonly known as:  MAG-OX Take 400 mg by mouth. Take one by mouth daily   methylPREDNISolone 4 MG Tbpk tablet Commonly known as:  MEDROL DOSEPAK Take by mouth as directed. Take 6 tablets on the first day prescribed then as directed.   multivitamin tablet Take 1 tablet by mouth daily.   Potassium 99 MG Tabs Take 99 mg by mouth. Take one by mouth daily   VITAMIN B COMPLEX-C PO Take 1,000 mg by mouth. Take one tablet by mouth daily   Vitamin D 2000 units tablet Take 2,000 Units by mouth daily.       Review of Systems:  ROS  Health Maintenance  Topic Date Due  . TETANUS/TDAP  09/05/2018  . INFLUENZA VACCINE  Completed  . DEXA SCAN  Completed  . ZOSTAVAX  Completed  . PNA vac Low Risk Adult  Completed    Physical Exam: Vitals:   04/11/16 1151  BP: (!) 160/70  Pulse: 73  Temp: 98.1 F (36.7 C)  TempSrc: Oral  SpO2: 96%  Weight: 164 lb (74.4 kg)   Body mass index is 30.99 kg/m. Physical Exam  Labs reviewed: Basic Metabolic Panel:  Recent Labs  05/20/15 0818 11/16/15 0916  NA 140 140  K 4.8 4.7  CL 101 106  CO2 24 26  GLUCOSE 82 90  BUN 25 23  CREATININE 1.03* 1.13*  CALCIUM 9.3 9.9  MG 2.0  --    Liver Function Tests:  Recent Labs  05/20/15 0818 11/16/15 0916  AST 31 33  ALT 25 20  ALKPHOS 51 43  BILITOT 0.4 0.4  PROT 6.7 6.9  ALBUMIN 4.2 4.3   No results for input(s): LIPASE, AMYLASE in the last 8760 hours. No results for input(s): AMMONIA in the last 8760 hours. CBC:  Recent Labs  05/20/15 0818 11/16/15 0916  WBC 5.7 5.0  NEUTROABS 3.0 2,750  HGB  --  11.2*  HCT 36.1 34.7*  MCV 92 92.0  PLT 277 273   Lipid Panel:  Recent Labs   11/16/15 0916  CHOL 173  HDL 65  LDLCALC 86  TRIG 108  CHOLHDL 2.7   Lab Results  Component Value Date   HGBA1C 5.6 03/04/2013   Reviewed notes from Dr. Paulla Fore over the past several months  Assessment/Plan 1. Closed sleeve fracture of left patella, initial encounter - she is recovering from this, but her back is now the bigger problem - Ambulatory referral to Physical Therapy - DG Knee 1-2 Views Left; Future--to be compared to previous at her orthopedic office - cont prn aleve--no more than 2 per day  2. Low back pain potentially associated with radiculopathy - across very lower back/buttocks/sacral area - await MRI report - agree with her that PT will be good for her, possibly even TENS if helpful - Ambulatory referral to Physical Therapy -start gabapentin for radiculopathy  -start salonpas with lidocaine to bilateral buttock at night  3. Essential hypertension, benign - bp elevated with nsaids and off lisinopril; will restart lisinopril as repeat still high -she will monitor at home  Labs/tests ordered:   Orders Placed This Encounter  Procedures  . DG Knee 1-2 Views Left    Standing Status:   Future    Standing Expiration Date:   06/09/2017    Order Specific Question:   Reason for Exam (SYMPTOM  OR DIAGNOSIS REQUIRED)    Answer:   s/p left patellar fracture--last set of xrays to assess healing (compare to those from Dr. Paulla Fore)    Order Specific Question:   Preferred imaging location?    Answer:   [facility]  . Ambulatory referral to Physical Therapy    Referral Priority:   Routine    Referral Type:   Physical Medicine    Referral Reason:   Specialty Services Required    Requested Specialty:   Physical Therapy    Number of Visits Requested:   1    Next appt:  05/23/2016  Aws Shere L. Fredonia Casalino, D.O. [facility] [address], [place] [address] Cell Phone (Mon-Fri 8am-5pm):  [phone] On Call:   [phone] & follow prompts after 5pm & weekends Office Phone:  [phone] Office Fax:  [phone]

## 2016-04-11 NOTE — Telephone Encounter (Signed)
Pt is scheduled for MRI at [facility] on Wed Jan 17 at 5p, pt is to arrive 45mins early to register, Left message on pt vm to return my call.

## 2016-04-12 ENCOUNTER — Ambulatory Visit (HOSPITAL_COMMUNITY)
Admission: RE | Admit: 2016-04-12 | Discharge: 2016-04-12 | Disposition: A | Discharge status: Home / Self Care | Payer: Medicare Other | Source: Ambulatory Visit | Attending: Family | Admitting: Family

## 2016-04-12 DIAGNOSIS — M5126 Other intervertebral disc displacement, lumbar region: Secondary | ICD-10-CM | POA: Diagnosis not present

## 2016-04-12 DIAGNOSIS — M545 Low back pain: Secondary | ICD-10-CM | POA: Diagnosis not present

## 2016-04-12 DIAGNOSIS — M48061 Spinal stenosis, lumbar region without neurogenic claudication: Secondary | ICD-10-CM | POA: Diagnosis not present

## 2016-04-12 DIAGNOSIS — M5127 Other intervertebral disc displacement, lumbosacral region: Secondary | ICD-10-CM | POA: Insufficient documentation

## 2016-04-12 DIAGNOSIS — M5416 Radiculopathy, lumbar region: Secondary | ICD-10-CM

## 2016-04-12 DIAGNOSIS — M4316 Spondylolisthesis, lumbar region: Secondary | ICD-10-CM | POA: Insufficient documentation

## 2016-04-12 DIAGNOSIS — M5417 Radiculopathy, lumbosacral region: Secondary | ICD-10-CM | POA: Diagnosis not present

## 2016-04-13 ENCOUNTER — Encounter: Payer: Self-pay | Admitting: Internal Medicine

## 2016-04-13 ENCOUNTER — Ambulatory Visit (HOSPITAL_COMMUNITY): Payer: Medicare Other

## 2016-04-18 ENCOUNTER — Ambulatory Visit (INDEPENDENT_AMBULATORY_CARE_PROVIDER_SITE_OTHER): Payer: Medicare Other | Admitting: Family

## 2016-04-18 DIAGNOSIS — M5416 Radiculopathy, lumbar region: Secondary | ICD-10-CM

## 2016-04-18 DIAGNOSIS — M5417 Radiculopathy, lumbosacral region: Secondary | ICD-10-CM | POA: Diagnosis not present

## 2016-04-18 DIAGNOSIS — S82092D Other fracture of left patella, subsequent encounter for closed fracture with routine healing: Secondary | ICD-10-CM

## 2016-04-22 ENCOUNTER — Telehealth (INDEPENDENT_AMBULATORY_CARE_PROVIDER_SITE_OTHER): Payer: Self-pay | Admitting: Orthopedic Surgery

## 2016-04-22 NOTE — Telephone Encounter (Signed)
Patient calling to find out if she will be hearing from anyone about the injection that Junie Panning mentioned when she was here last on Jan 22nd. Please advise.

## 2016-04-25 ENCOUNTER — Telehealth (INDEPENDENT_AMBULATORY_CARE_PROVIDER_SITE_OTHER): Payer: Self-pay | Admitting: Family

## 2016-04-25 ENCOUNTER — Other Ambulatory Visit (INDEPENDENT_AMBULATORY_CARE_PROVIDER_SITE_OTHER): Payer: Self-pay

## 2016-04-25 NOTE — Telephone Encounter (Signed)
Yes

## 2016-04-25 NOTE — Progress Notes (Signed)
Office Visit Note   Patient: Jody [name]           Date of Birth: [birth year]           MRN: [id number] Visit Date: 04/18/2016              Requested by: Gayland Curry, DO [place], [place] [address] PCP: Hollace Kinnier, DO  No chief complaint on file.   HPI: The patient has 76 year old woman who is seen today for 2 separate issues. She is seen in follow-up for a left patellar fracture. She has been full weightbearing doing various exercises at the gym participating in group exercise class. Complains of continued knee pain. is concerned for nonhealing. Is also seen to review of MRI of her lumbar spine. Has continued to have low back pain with radicular symptoms down the left lower extremity.    Assessment & Plan: Visit Diagnoses:  1. 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 Closed sleeve fracture of left patella with routine healing, subsequent encounter     Plan: We will refer her to Dr. Ernestina Patches for [facility] of the lumbar spine. Have her resume her bledsoe brace in extension. Follow-up in office in 2 more weeks with radiographs of the left knee.  Follow-Up Instructions: Return in about 2 weeks (around 05/02/2016), or patella fx.   Physical Exam  Constitutional: Appears well-developed.  Head: Normocephalic.  Eyes: EOM are normal.  Neck: Normal range of motion.  Cardiovascular: Normal rate.   Pulmonary/Chest: Effort normal.  Neurological: Is alert.  Skin: Skin is warm.  Psychiatric: Has a normal mood and affect.  Left Knee Exam   Tenderness  The patient is experiencing tenderness in the patella.  Range of Motion  The patient has normal left knee ROM.  Other  Erythema: absent Swelling: mild   Back Exam   Tenderness  The patient is experiencing no tenderness.   Range of Motion  The patient has normal back ROM.  Tests  Straight leg raise left: positive  Comments:  No focal motor weakness.     Imaging: No results  found.  Orders:  Orders Placed This Encounter  Procedures  . Ambulatory referral to Physical Medicine Rehab   No orders of the defined types were placed in this encounter.    Procedures: No procedures performed  Clinical Data: No additional findings.  Subjective: Review of Systems  Constitutional: Negative for chills and fever.  Musculoskeletal: Positive for arthralgias and back pain. Negative for joint swelling.  Skin: Negative for wound.  Neurological: Negative for weakness and numbness.    Objective: Vital Signs: There were no vitals taken for this visit.  Specialty Comments:  No specialty comments available.  PMFS History: Patient Active Problem List   Diagnosis Date Noted  . Low back pain potentially associated with radiculopathy 04/11/2016  . Closed patellar sleeve fracture of left knee 02/03/2016  . Finger fracture, left 02/03/2016  . Leg cramps, sleep related 11/19/2015  . Absolute anemia 05/16/2014  . Primary osteoarthritis involving multiple joints 05/16/2014  . Cataract cortical, senile 05/16/2014  . Essential hypertension, benign 09/03/2012  . Edema of both legs   . Senile osteoporosis   . B12 deficiency due to diet    Past Medical History:  Diagnosis Date  . Anemia, unspecified   . B12 deficiency due to diet   . Cramp of limb   . Edema of both legs   . Encounter for long-term (  current) use of other medications   . Lumbago   . Obesity   . Osteoarthrosis, unspecified whether generalized or localized, unspecified site   . Restless legs syndrome (RLS)   . Senile osteoporosis     Family History  Problem Relation Age of Onset  . Cancer Sister   . Cancer Brother     Past Surgical History:  Procedure Laterality Date  . CATARACT EXTRACTION W/ INTRAOCULAR LENS  IMPLANT, BILATERAL  04/2014  . [place]  2005  . STOMACH SURGERY  1981   stapleing    Social History   Occupational History  . retired Administrator     Social History  Main Topics  . Smoking status: Former Smoker    Quit date: 05/22/1975  . Smokeless tobacco: Never Used     Comment: Quit in early 61's  . Alcohol use No  . Drug use: No  . Sexual activity: Not on file

## 2016-04-25 NOTE — Telephone Encounter (Signed)
Patient anxious to find out when she can be scheduled for injections.  Please call and advise.

## 2016-04-25 NOTE — Telephone Encounter (Signed)
Did you want esi sch?

## 2016-04-25 NOTE — Telephone Encounter (Signed)
duplicate

## 2016-04-28 HISTORY — PX: EYE SURGERY: SHX253

## 2016-05-03 ENCOUNTER — Ambulatory Visit (INDEPENDENT_AMBULATORY_CARE_PROVIDER_SITE_OTHER): Payer: Self-pay

## 2016-05-03 ENCOUNTER — Ambulatory Visit (INDEPENDENT_AMBULATORY_CARE_PROVIDER_SITE_OTHER): Payer: Medicare Other | Admitting: Physical Medicine and Rehabilitation

## 2016-05-03 ENCOUNTER — Encounter (INDEPENDENT_AMBULATORY_CARE_PROVIDER_SITE_OTHER): Payer: Self-pay | Admitting: Physical Medicine and Rehabilitation

## 2016-05-03 VITALS — BP 124/64 | HR 76 | Temp 98.0°F

## 2016-05-03 DIAGNOSIS — M5416 Radiculopathy, lumbar region: Secondary | ICD-10-CM

## 2016-05-03 MED ORDER — METHYLPREDNISOLONE ACETATE 80 MG/ML IJ SUSP
80.0000 mg | Freq: Once | INTRAMUSCULAR | Status: AC
  Administered 2016-05-03: 80 mg

## 2016-05-03 MED ORDER — LIDOCAINE HCL (PF) 1 % IJ SOLN
0.3300 mL | Freq: Once | INTRAMUSCULAR | Status: AC
  Administered 2016-05-03: 0.3 mL

## 2016-05-03 NOTE — Progress Notes (Signed)
Jody [name] - 76 y.o. female MRN [id number]  Date of birth: 1940-12-30  Office Visit Note: Visit Date: 05/03/2016 PCP: Hollace Kinnier, DO Referred by: Gayland Curry, DO  Subjective: Chief Complaint  Patient presents with  . Lower Back - Pain   HPI: [name] is a 76 year old female who originally was seen in our office by Dr. Derry [name] when he was here practicing. She has been under the care of Jody [name] over the years for physical therapy and continues working with her recently. She reports several months of worsening lower back pain that changes from left to right side. Equally painful on both sides. Pain and tingling down left leg to foot.  States sometimes foot feels like something is on it such as a sock when she is not wearing one. Not sure if coming from knee or back. Wearing brace for knee for several months due to "broken knee cap." History of L3-4 instrumented fusion by Dr. Cyndy [name]. She reports that the back surgery didn't help very much. She's been followed by Dr. Sharol [name] and Jody [name]. 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    ROS Otherwise per HPI.  Assessment & Plan: Visit Diagnoses:  1. Lumbar radiculopathy     Plan: Findings:  Chronic worsening severe low back and leg pain and left more than right with equal side back pain. We are going to complete a diagnostic of therapeutic left L5-S1 intralaminar epidural steroid injection.    Meds & Orders:  Meds ordered this encounter  Medications  . lidocaine (PF) (XYLOCAINE) 1 % injection 0.3 mL  . methylPREDNISolone acetate (DEPO-MEDROL) injection 80 mg    Orders Placed This Encounter  Procedures  . XR C-ARM NO REPORT  . Epidural Steroid injection    Follow-up: Return for scheduled follow up with Jody [name].FNP/Duda.   Procedures: No procedures performed  Lumbar Epidural Steroid Injection - Interlaminar  Approach with Fluoroscopic Guidance  Patient: Jody [name]      Date of Birth: [birth year] MRN: [id number] PCP: Hollace Kinnier, DO      Visit Date: 05/03/2016   Universal Protocol:    Date/Time: 02/07/188:27 AM  Consent [name] By: the patient  Position: PRONE  Additional Comments: Vital signs were monitored before and after the procedure. Patient was prepped and draped in the usual sterile fashion. The correct patient, procedure, and site was verified.   Injection Procedure Details:  Procedure Site One Meds Administered:  Meds ordered this encounter  Medications  . lidocaine (PF) (XYLOCAINE) 1 % injection 0.3 mL  . methylPREDNISolone acetate (DEPO-MEDROL) injection 80 mg     Laterality: Left  Location/Site:  L5-S1  Needle size: 20 G  Needle type: Tuohy  Needle Placement: Paramedian epidural  Findings:  -Contrast Used: 1 mL iohexol 180 mg iodine/mL   -Comments: Excellent flow of contrast into the epidural space.  Procedure Details: Using a paramedian approach from the side mentioned above, the region overlying the inferior lamina was localized under fluoroscopic visualization and the soft tissues overlying this structure were infiltrated with 4 ml. of 1% Lidocaine without Epinephrine. The Tuohy needle was inserted into the epidural space using a paramedian approach.   The epidural space was localized using loss of resistance along with lateral and bi-planar fluoroscopic views.  After negative aspirate for air, blood, and CSF, a 2 ml. volume of Isovue-250 was injected into the  epidural space and the flow of contrast was observed. Radiographs were obtained for documentation purposes.    The injectate was administered into the level noted above.   Additional Comments:  The patient tolerated the procedure well Dressing: Band-Aid    Post-procedure details: Patient was observed during the procedure. Post-procedure instructions were reviewed.  Patient left the clinic  in stable condition.    Clinical History: IMPRESSION: Large L5-S1 disc extrusion displaces the traversing RIGHT L5 nerve and encroaches upon the traversing RIGHT S1 nerve.  L3-4 PLIF and posterior decompression. 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  Multilevel neural foraminal narrowing: Severe on the RIGHT at L4-5, moderate to severe on the LEFT at L5-S1.   Electronically Signed   By: Jody [name] M.D.   On: 04/12/2016 19:53  She reports that she quit smoking about 40 years ago. She has never used smokeless tobacco. No results for input(s): HGBA1C, LABURIC in the last 8760 hours.  Objective:  VS:  HT:    WT:   BMI:     BP:124/64  HR:76bpm  TEMP:98 F (36.7 C)(Oral)  RESP:98 % Physical Exam  Musculoskeletal:  She ambulates with a cane. She has a fairly large knee immobilizer on the left. She has good distal strength without deficit.    Ortho Exam Imaging: Xr C-arm No Report  Result Date: 05/03/2016 Please see Notes or Procedures tab for imaging impression.   Past Medical/Family/Surgical/Social History: Medications & Allergies reviewed per EMR Patient Active Problem List   Diagnosis Date Noted  . Low back pain potentially associated with radiculopathy 04/11/2016  . Closed patellar sleeve fracture of left knee 02/03/2016  . Finger fracture, left 02/03/2016  . Leg cramps, sleep related 11/19/2015  . Absolute anemia 05/16/2014  . Primary osteoarthritis involving multiple joints 05/16/2014  . Cataract cortical, senile 05/16/2014  . Essential hypertension, benign 09/03/2012  . Edema of both legs   . Senile osteoporosis   . B12 deficiency due to diet    Past Medical History:  Diagnosis Date  . Anemia, unspecified   . B12 deficiency due to diet   . Cramp of limb   . Edema of both legs   . Encounter  for long-term (current) use of other medications   . Lumbago   . Obesity   . Osteoarthrosis, unspecified whether generalized or localized, unspecified site   . Restless legs syndrome (RLS)   . Senile osteoporosis    Family History  Problem Relation Age of Onset  . Cancer Sister   . Cancer Brother    Past Surgical History:  Procedure Laterality Date  . CATARACT EXTRACTION W/ INTRAOCULAR LENS  IMPLANT, BILATERAL  04/2014  . [place]  2005  . STOMACH SURGERY  1981   stapleing    Social History   Occupational History  . retired Administrator     Social History Main Topics  . Smoking status: Former Smoker    Quit date: 05/22/1975  . Smokeless tobacco: Never Used     Comment: Quit in early 91's  . Alcohol use No  . Drug use: No  . Sexual activity: Not on file

## 2016-05-03 NOTE — Patient Instructions (Signed)

## 2016-05-04 NOTE — Procedures (Signed)
Lumbar Epidural Steroid Injection - Interlaminar Approach with Fluoroscopic Guidance  Patient: Jody [name]      Date of Birth: 24-Nov-1940 MRN: [id number] PCP: Hollace Kinnier, DO      Visit Date: 05/03/2016   Universal Protocol:    Date/Time: 02/07/188:27 AM  Consent Given By: the patient  Position: PRONE  Additional Comments: Vital signs were monitored before and after the procedure. Patient was prepped and draped in the usual sterile fashion. The correct patient, procedure, and site was verified.   Injection Procedure Details:  Procedure Site One Meds Administered:  Meds ordered this encounter  Medications  . lidocaine (PF) (XYLOCAINE) 1 % injection 0.3 mL  . methylPREDNISolone acetate (DEPO-MEDROL) injection 80 mg     Laterality: Left  Location/Site:  L5-S1  Needle size: 20 G  Needle type: Tuohy  Needle Placement: Paramedian epidural  Findings:  -Contrast Used: 1 mL iohexol 180 mg iodine/mL   -Comments: Excellent flow of contrast into the epidural space.  Procedure Details: Using a paramedian approach from the side mentioned above, the region overlying the inferior lamina was localized under fluoroscopic visualization and the soft tissues overlying this structure were infiltrated with 4 ml. of 1% Lidocaine without Epinephrine. The Tuohy needle was inserted into the epidural space using a paramedian approach.   The epidural space was localized using loss of resistance along with lateral and bi-planar fluoroscopic views.  After negative aspirate for air, blood, and CSF, a 2 ml. volume of Isovue-250 was injected into the epidural space and the flow of contrast was observed. Radiographs were obtained for documentation purposes.    The injectate was administered into the level noted above.   Additional Comments:  The patient tolerated the procedure well Dressing: Band-Aid    Post-procedure details: Patient was observed during the procedure. Post-procedure  instructions were reviewed.  Patient left the clinic in stable condition.

## 2016-05-10 ENCOUNTER — Ambulatory Visit (INDEPENDENT_AMBULATORY_CARE_PROVIDER_SITE_OTHER): Payer: Medicare Other | Admitting: Orthopedic Surgery

## 2016-05-10 ENCOUNTER — Encounter (INDEPENDENT_AMBULATORY_CARE_PROVIDER_SITE_OTHER): Payer: Self-pay | Admitting: Orthopedic Surgery

## 2016-05-10 ENCOUNTER — Ambulatory Visit (INDEPENDENT_AMBULATORY_CARE_PROVIDER_SITE_OTHER): Payer: Medicare Other

## 2016-05-10 VITALS — Ht 61.0 in | Wt 164.0 lb

## 2016-05-10 DIAGNOSIS — S82032G Displaced transverse fracture of left patella, subsequent encounter for closed fracture with delayed healing: Secondary | ICD-10-CM

## 2016-05-10 NOTE — Progress Notes (Signed)
Office Visit Note   Patient: Jody [name]           Date of Birth: 01-Nov-1940           MRN: [id number] Visit Date: 05/10/2016              Requested by: Gayland Curry, DO [place], [place] [address] PCP: Hollace Kinnier, DO  Chief Complaint  Patient presents with  . Left Knee - Follow-up    displaced transverse fracture left patella    HPI: Pt is in a Bledsoe locked at full extension. " I am sick of wearing this thing and its hurting me" Pamella Pert, RMA   Patient states that she is feeling better from her epidural steroid injection with Dr. Ernestina Patches. Assessment & Plan: Visit Diagnoses:  1. Displaced transverse fracture of left patella, subsequent encounter for closed fracture with delayed healing     Plan: We'll discontinue the Bledsoe brace at this time. Patient is asymptomatic with a straight leg raise asymptomatic with walking. She will increase her activities as tolerated. Discussed that if she has recurrent back symptoms we would have her follow up with Dr. noon for evaluation for an additional injection. If she develops knee pain she is to call and follow up at that time.  Follow-Up Instructions: Return if symptoms worsen or fail to improve.   Ortho Exam Examination patient is alert oriented no adenopathy well-dressed normal affect normal respiratory effort she has a mild antalgic gait. There is no tenderness to palpation over the patella no palpable defect the quad and patellar tendon are nontender to palpation. Patient can do a straight leg raise without extensor lag and is asymptomatic. She is asymptomatic with walking.  Imaging: Xr Knee 1-2 Views Left  Result Date: 05/10/2016 Two-view radiographs of the left knee shows no change in alignment from previous radiographs. There is no displacement in the AP plane lateral displacement is unchanged.   Orders:  Orders Placed This Encounter  Procedures  . XR Knee 1-2 Views Left   No orders of the defined  types were placed in this encounter.    Procedures: No procedures performed  Clinical Data: No additional findings.  Subjective: Review of Systems  Objective: Vital Signs: Ht 5\' 1"  (1.549 m)   Wt 164 lb (74.4 kg)   BMI 30.99 kg/m   Specialty Comments:  No specialty comments available.  PMFS History: Patient Active Problem List   Diagnosis Date Noted  . Low back pain potentially associated with radiculopathy 04/11/2016  . Displaced transverse fracture of left patella, subsequent encounter for closed fracture with delayed healing 02/03/2016  . Finger fracture, left 02/03/2016  . Leg cramps, sleep related 11/19/2015  . Absolute anemia 05/16/2014  . Primary osteoarthritis involving multiple joints 05/16/2014  . Cataract cortical, senile 05/16/2014  . Essential hypertension, benign 09/03/2012  . Edema of both legs   . Senile osteoporosis   . B12 deficiency due to diet    Past Medical History:  Diagnosis Date  . Anemia, unspecified   . B12 deficiency due to diet   . Cramp of limb   . Edema of both legs   . Encounter for long-term (current) use of other medications   . Lumbago   . Obesity   . Osteoarthrosis, unspecified whether generalized or localized, unspecified site   . Restless legs syndrome (RLS)   . Senile osteoporosis     Family History  Problem Relation Age of Onset  .  Cancer Sister   . Cancer Brother     Past Surgical History:  Procedure Laterality Date  . CATARACT EXTRACTION W/ INTRAOCULAR LENS  IMPLANT, BILATERAL  04/2014  . [place]  2005  . STOMACH SURGERY  1981   stapleing    Social History   Occupational History  . retired Administrator     Social History Main Topics  . Smoking status: Former Smoker    Quit date: 05/22/1975  . Smokeless tobacco: Never Used     Comment: Quit in early 79's  . Alcohol use No  . Drug use: No  . Sexual activity: Not on file

## 2016-05-20 ENCOUNTER — Ambulatory Visit (INDEPENDENT_AMBULATORY_CARE_PROVIDER_SITE_OTHER): Payer: Medicare Other

## 2016-05-20 ENCOUNTER — Other Ambulatory Visit: Payer: Medicare Other

## 2016-05-20 VITALS — BP 146/84 | HR 70 | Temp 97.9°F | Ht 61.0 in | Wt 160.0 lb

## 2016-05-20 DIAGNOSIS — E669 Obesity, unspecified: Secondary | ICD-10-CM

## 2016-05-20 DIAGNOSIS — D539 Nutritional anemia, unspecified: Secondary | ICD-10-CM | POA: Diagnosis not present

## 2016-05-20 DIAGNOSIS — I10 Essential (primary) hypertension: Secondary | ICD-10-CM

## 2016-05-20 DIAGNOSIS — Z0000 Encounter for general adult medical examination without abnormal findings: Secondary | ICD-10-CM

## 2016-05-20 LAB — HEPATIC FUNCTION PANEL
ALT: 20 U/L (ref 6–29)
AST: 31 U/L (ref 10–35)
Albumin: 4 g/dL (ref 3.6–5.1)
Alkaline Phosphatase: 62 U/L (ref 33–130)
Bilirubin, Direct: 0.1 mg/dL (ref <=0.2)
Indirect Bilirubin: 0.3 mg/dL (ref 0.2–1.2)
Total Bilirubin: 0.4 mg/dL (ref 0.2–1.2)
Total Protein: 6.9 g/dL (ref 6.1–8.1)

## 2016-05-20 LAB — BASIC METABOLIC PANEL
BUN: 28 mg/dL — ABNORMAL HIGH (ref 7–25)
CO2: 23 mmol/L (ref 20–31)
Calcium: 9.9 mg/dL (ref 8.6–10.4)
Chloride: 104 mmol/L (ref 98–110)
Creat: 1.13 mg/dL — ABNORMAL HIGH (ref 0.60–0.93)
Glucose, Bld: 92 mg/dL (ref 65–99)
Potassium: 4.3 mmol/L (ref 3.5–5.3)
Sodium: 141 mmol/L (ref 135–146)

## 2016-05-20 LAB — CBC WITH DIFFERENTIAL/PLATELET
Basophils Absolute: 0 cells/uL (ref 0–200)
Basophils Relative: 0 %
Eosinophils Absolute: 272 cells/uL (ref 15–500)
Eosinophils Relative: 4 %
HCT: 33.8 % — ABNORMAL LOW (ref 35.0–45.0)
Hemoglobin: 11.1 g/dL — ABNORMAL LOW (ref 11.7–15.5)
Lymphocytes Relative: 26 %
Lymphs Abs: 1768 cells/uL (ref 850–3900)
MCH: 29.6 pg (ref 27.0–33.0)
MCHC: 32.8 g/dL (ref 32.0–36.0)
MCV: 90.1 fL (ref 80.0–100.0)
MPV: 8.7 fL (ref 7.5–12.5)
Monocytes Absolute: 612 cells/uL (ref 200–950)
Monocytes Relative: 9 %
Neutro Abs: 4148 cells/uL (ref 1500–7800)
Neutrophils Relative %: 61 %
Platelets: 309 10*3/uL (ref 140–400)
RBC: 3.75 MIL/uL — ABNORMAL LOW (ref 3.80–5.10)
RDW: 13.2 % (ref 11.0–15.0)
WBC: 6.8 10*3/uL (ref 3.8–10.8)

## 2016-05-20 LAB — IRON AND TIBC
%SAT: 19 % (ref 11–50)
Iron: 71 ug/dL (ref 45–160)
TIBC: 370 ug/dL (ref 250–450)
UIBC: 299 ug/dL (ref 125–400)

## 2016-05-20 LAB — LIPID PANEL
Cholesterol: 184 mg/dL (ref <200)
HDL: 74 mg/dL (ref >50)
LDL Cholesterol: 95 mg/dL (ref <100)
Total CHOL/HDL Ratio: 2.5 Ratio (ref <5.0)
Triglycerides: 74 mg/dL (ref <150)
VLDL: 15 mg/dL (ref <30)

## 2016-05-20 NOTE — Progress Notes (Signed)
   I reviewed health advisor's note, was available for consultation and agree with the assessment and plan as written.  Gabapentin was for neuropathic pain from her back and if that's better it can be stopped.  Not sure what she's doing with her bp meds--should be taking them as ordered.    Donzel Romack L. Montarius Kitagawa, D.O. [facility] [address], [place] [address] Cell Phone (Mon-Fri 8am-5pm):  [phone] On Call:  [phone] & follow prompts after 5pm & weekends Office Phone:  [phone] Office Fax:  [phone]   Quick Notes   Health Maintenance:  None    Abnormal Screen: MMSE 30/30- Passed Clock test    Patient Concerns:  Pt would like to discuss her Gabapentin; questions how long she is suppose to be taking it. Pt also would like to discuss her BP meds and how she has been taking them.     Nurse Concerns:  None

## 2016-05-20 NOTE — Patient Instructions (Addendum)
Jody [name] , Thank you for taking time to come for your Medicare Wellness Visit. I appreciate your ongoing commitment to your health goals. Please review the following plan we discussed and let me know if I can assist you in the future.   These are the goals we discussed: Goals    . Increase physical activity          Starting 05/20/16, I will attempt to increase my physical activity.        This is a list of the screening recommended for you and due dates:  Health Maintenance  Topic Date Due  . Tetanus Vaccine  09/05/2018  . Flu Shot  Completed  . DEXA scan (bone density measurement)  Completed  . Pneumonia vaccines  Completed  Preventive Care for Adults  A healthy lifestyle and preventive care can promote health and wellness. Preventive health guidelines for adults include the following key practices.  . A routine yearly physical is a good way to check with your health care provider about your health and preventive screening. It is a chance to share any concerns and updates on your health and to receive a thorough exam.  . Visit your dentist for a routine exam and preventive care every 6 months. Brush your teeth twice a day and floss once a day. Good oral hygiene prevents tooth decay and gum disease.  . The frequency of eye exams is based on your age, health, family medical history, use  of contact lenses, and other factors. Follow your health care provider's ecommendations for frequency of eye exams.  . Eat a healthy diet. Foods like vegetables, fruits, whole grains, low-fat dairy products, and lean protein foods contain the nutrients you need without too many calories. Decrease your intake of foods high in solid fats, added sugars, and salt. Eat the right amount of calories for you. Get information about a proper diet from your health care provider, if necessary.  . Regular physical exercise is one of the most important things you can do for your health. Most adults should get at  least 150 minutes of moderate-intensity exercise (any activity that increases your heart rate and causes you to sweat) each week. In addition, most adults need muscle-strengthening exercises on 2 or more days a week.  Silver Sneakers may be a benefit available to you. To determine eligibility, you may visit the website: www.silversneakers.com or contact program at (518)644-1196 Mon-Fri between 8AM-8PM.   . Maintain a healthy weight. The body mass index (BMI) is a screening tool to identify possible weight problems. It provides an estimate of body fat based on height and weight. Your health care provider can find your BMI and can help you achieve or maintain a healthy weight.   For adults 20 years and older: ? A BMI below 18.5 is considered underweight. ? A BMI of 18.5 to 24.9 is normal. ? A BMI of 25 to 29.9 is considered overweight. ? A BMI of 30 and above is considered obese.   . Maintain normal blood lipids and cholesterol levels by exercising and minimizing your intake of saturated fat. Eat a balanced diet with plenty of fruit and vegetables. Blood tests for lipids and cholesterol should begin at age 48 and be repeated every 5 years. If your lipid or cholesterol levels are high, you are over 50, or you are at high risk for heart disease, you may need your cholesterol levels checked more frequently. Ongoing high lipid and cholesterol levels should be treated with  medicines if diet and exercise are not working.  . If you smoke, find out from your health care provider how to quit. If you do not use tobacco, please do not start.  . If you choose to drink alcohol, please do not consume more than 2 drinks per day. One drink is considered to be 12 ounces (355 mL) of beer, 5 ounces (148 mL) of wine, or 1.5 ounces (44 mL) of liquor.  . If you are 5-81 years old, ask your health care provider if you should take aspirin to prevent strokes.  . Use sunscreen. Apply sunscreen liberally and repeatedly  throughout the day. You should seek shade when your shadow is shorter than you. Protect yourself by wearing long sleeves, pants, a wide-brimmed hat, and sunglasses year round, whenever you are outdoors.  . Once a month, do a whole body skin exam, using a mirror to look at the skin on your back. Tell your health care provider of new moles, moles that have irregular borders, moles that are larger than a pencil eraser, or moles that have changed in shape or color.

## 2016-05-20 NOTE — Progress Notes (Signed)
Subjective:   Jody [name] is a 76 y.o. female who presents for Medicare Annual (Subsequent) preventive examination.  Review of Systems:   Cardiac Risk Factors include: advanced age (>20men, >71 women);obesity (BMI >30kg/m2);smoking/ tobacco exposure     Objective:     Vitals: BP (!) 146/84 (BP Location: Left Arm, Patient Position: Sitting, Cuff Size: Normal)   Pulse 70   Temp 97.9 F (36.6 C) (Oral)   Ht 5\' 1"  (1.549 m)   Wt 160 lb (72.6 kg)   SpO2 98%   BMI 30.23 kg/m   Body mass index is 30.23 kg/m.   Tobacco History  Smoking Status  . Former Smoker  . Quit date: 05/22/1975  Smokeless Tobacco  . Never Used    Comment: Quit in early 30's     Counseling given: Not Answered   Past Medical History:  Diagnosis Date  . Anemia, unspecified   . B12 deficiency due to diet   . Cramp of limb   . Edema of both legs   . Encounter for long-term (current) use of other medications   . Lumbago   . Obesity   . Osteoarthrosis, unspecified whether generalized or localized, unspecified site   . Restless legs syndrome (RLS)   . Senile osteoporosis    Past Surgical History:  Procedure Laterality Date  . CATARACT EXTRACTION W/ INTRAOCULAR LENS  IMPLANT, BILATERAL  04/2014  . [place]  2005  . STOMACH SURGERY  1981   stapleing    Family History  Problem Relation Age of Onset  . Cancer Sister   . Cancer Brother    History  Sexual Activity  . Sexual activity: No    Outpatient Encounter Prescriptions as of 05/20/2016  Medication Sig  . Calcium Carb-Cholecalciferol (CALCIUM + D3) 600-200 MG-UNIT TABS Take one tablet three times daily  . celecoxib (CELEBREX) 100 MG capsule Take 100 mg by mouth daily.  . Cholecalciferol (VITAMIN D) 2000 UNITS tablet Take 2,000 Units by mouth daily.  [name] gabapentin (NEURONTIN) 100 MG capsule Take 1 capsule (100 mg total) by mouth 3 (three) times daily.  . Iron 66 MG TABS Take one tablet once daily  . lisinopril (PRINIVIL,ZESTRIL) 5 MG  tablet TAKE 1 TABLET DAILY (IF YOUR BLOOD PRESSURE IS OVER 140/90, TAKE AN ADDITIONAL 5 MG TABLET)  . Multiple Vitamin (MULTIVITAMIN) tablet Take 1 tablet by mouth daily.  . [DISCONTINUED] baclofen (LIORESAL) 10 MG tablet Take 1 tablet (10 mg total) by mouth at bedtime as needed for muscle spasms.   No facility-administered encounter medications on file as of 05/20/2016.     Activities of Daily Living In your present state of health, do you have any difficulty performing the following activities: 05/20/2016 05/22/2015  Hearing? N N  Vision? Y N  Difficulty concentrating or making decisions? N N  Walking or climbing stairs? Y N  Dressing or bathing? N N  Doing errands, shopping? N N  Preparing Food and eating ? N -  Using the Toilet? N -  In the past six months, have you accidently leaked urine? Y -  Do you have problems with loss of bowel control? N -  Managing your Medications? N -  Managing your Finances? N -  Housekeeping or managing your Housekeeping? N -  Some recent data might be hidden    Patient Care Team: Gayland Curry, DO as PCP - General (Geriatric Medicine) Marica Otter, OD as Consulting Physician (Optometry) Wallene Huh, DPM as Consulting Physician (  Podiatry) Pixie Casino, MD as Consulting Physician (Cardiology)    Assessment:     Exercise Activities and Dietary recommendations Current Exercise Habits: The patient has a physically strenous job, but has no regular exercise apart from work., Exercise limited by: orthopedic condition(s)  Goals    . Increase physical activity          Starting 05/20/16, I will attempt to increase my physical activity.       Fall Risk Fall Risk  05/20/2016 11/19/2015 05/31/2015 05/22/2015 11/14/2014  Falls in the past year? Yes No Yes No No  Number falls in past yr: 2 or more - 1 - -  Injury with Fall? Yes - No - -  Risk for fall due to : Impaired balance/gait;History of fall(s) - History of fall(s);Impaired mobility - History of  fall(s);Medication side effect  Risk for fall due to (comments): - - OA - -  Follow up Falls prevention discussed - Falls evaluation completed;Education provided;Falls prevention discussed - -   Depression Screen PHQ 2/9 Scores 05/20/2016 04/11/2016 11/19/2015 05/22/2015  PHQ - 2 Score 0 0 0 0     Cognitive Function MMSE - Mini Mental State Exam 05/20/2016 05/22/2015  Orientation to time 5 5  Orientation to Place 5 5  Registration 3 3  Attention/ Calculation 5 5  Recall 3 3  Language- name 2 objects 2 2  Language- repeat 1 1  Language- follow 3 step command 3 3  Language- read & follow direction 1 1  Write a sentence 1 1  Copy design 1 1  Total score 30 30        Immunization History  Administered Date(s) Administered  . Hepatitis A 11/13/1997, 06/07/2005  . Hepatitis B 09/11/1997, 10/09/1997, 03/26/1998  . IPV 09/11/1997  . Influenza,inj,Quad PF,36+ Mos 01/28/2013, 05/16/2014, 11/19/2015  . Influenza-Unspecified 04/09/1998  . Pneumococcal Conjugate-13 11/14/2014  . Pneumococcal Polysaccharide-23 03/28/2008  . Td 10/09/1997, 09/04/2008  . Typhoid Parenteral 11/13/1997, 06/07/2005  . Zoster 05/27/2007   Screening Tests Health Maintenance  Topic Date Due  . TETANUS/TDAP  09/05/2018  . INFLUENZA VACCINE  Completed  . DEXA SCAN  Completed  . PNA vac Low Risk Adult  Completed      Plan:    I have personally reviewed and addressed the Medicare Annual Wellness questionnaire and have noted the following in the patient's chart:  A. Medical and social history B. Use of alcohol, tobacco or illicit drugs  C. Current medications and supplements D. Functional ability and status E.  Nutritional status F.  Physical activity G. Advance directives H. List of other physicians I.  Hospitalizations, surgeries, and ER visits in previous 12 months J.  [place] to include hearing, vision, cognitive, depression L. Referrals and appointments - none  In addition, I have  reviewed and discussed with patient certain preventive protocols, quality metrics, and best practice recommendations. A written personalized care plan for preventive services as well as general preventive health recommendations were provided to patient.  See attached scanned questionnaire for additional information.   Signed,   Allyn Kenner, LPN Health Advisor

## 2016-05-21 LAB — FERRITIN: Ferritin: 92 ng/mL (ref 20–288)

## 2016-05-23 ENCOUNTER — Encounter: Payer: Self-pay | Admitting: *Deleted

## 2016-05-23 ENCOUNTER — Other Ambulatory Visit: Payer: Medicare Other

## 2016-05-23 ENCOUNTER — Ambulatory Visit: Payer: Medicare Other

## 2016-05-27 ENCOUNTER — Encounter: Payer: Self-pay | Admitting: Internal Medicine

## 2016-05-27 ENCOUNTER — Other Ambulatory Visit: Payer: Self-pay | Admitting: *Deleted

## 2016-05-27 ENCOUNTER — Ambulatory Visit (INDEPENDENT_AMBULATORY_CARE_PROVIDER_SITE_OTHER): Payer: Medicare Other | Admitting: Internal Medicine

## 2016-05-27 VITALS — BP 126/80 | HR 64 | Resp 12 | Ht 61.0 in | Wt 160.0 lb

## 2016-05-27 DIAGNOSIS — S82092S Other fracture of left patella, sequela: Secondary | ICD-10-CM

## 2016-05-27 DIAGNOSIS — M810 Age-related osteoporosis without current pathological fracture: Secondary | ICD-10-CM

## 2016-05-27 DIAGNOSIS — I10 Essential (primary) hypertension: Secondary | ICD-10-CM | POA: Diagnosis not present

## 2016-05-27 DIAGNOSIS — M545 Low back pain, unspecified: Secondary | ICD-10-CM

## 2016-05-27 DIAGNOSIS — G4762 Sleep related leg cramps: Secondary | ICD-10-CM

## 2016-05-27 DIAGNOSIS — M150 Primary generalized (osteo)arthritis: Secondary | ICD-10-CM

## 2016-05-27 DIAGNOSIS — E663 Overweight: Secondary | ICD-10-CM | POA: Diagnosis not present

## 2016-05-27 DIAGNOSIS — M8949 Other hypertrophic osteoarthropathy, multiple sites: Secondary | ICD-10-CM

## 2016-05-27 DIAGNOSIS — M159 Polyosteoarthritis, unspecified: Secondary | ICD-10-CM

## 2016-05-27 MED ORDER — GABAPENTIN 100 MG PO CAPS: 100.0000 mg | ORAL_CAPSULE | Freq: Three times a day (TID) | ORAL | 3 refills | Status: DC

## 2016-05-27 NOTE — Progress Notes (Signed)
Provider:  Rexene Edison. Mariea Clonts, D.O., C.M.D. Location:   [facility]  Place of Service:   clinic  Previous PCP: Hollace Kinnier, DO Patient Care Team: Gayland Curry, DO as PCP - General (Geriatric Medicine) Marica Otter, OD as Consulting Physician (Optometry) Wallene Huh, DPM as Consulting Physician (Podiatry) Pixie Casino, MD as Consulting Physician (Cardiology)  Extended Emergency Contact Information Primary Emergency Contact: Panetta,Donald Address: [address]          [place] [address] [place] Phone: [phone] Relation: Spouse  Code Status: DNR Goals of Care: Advanced Directive information Advanced Directives 05/27/2016  Does Patient Have a Medical Advance Directive? Yes  Type of Advance Directive [place]  Does patient want to make changes to medical advance directive? No - Patient declined  Copy of [place] in Chart? Yes   Chief Complaint  Patient presents with  . Medical Management of Chronic Issues    Extended visit, EKG, + fall risk, and MMSE 30/30 (passed clock drawing)-completed on 05/20/16   . Medication Refill    No refills needed today     HPI: Patient is a 76 y.o. female seen today for an extended visit.  She has fallen twice.  Once over her suitcase and again after her knee injury.  She is making great headway now with recovering from her patellar fracture.    AWV reviewed--MMSE 30/30 and passed clock.    EKG performed.    Has lost 10 lbs since Nov.    Never took her muscle spasm medication.  She got it and lost it.    Gabapentin helps.  Left whole leg with numbness, tingling and weakness--taking in am, night and reserves middle one to use if needed.  Some days needs, other days not.  Makes a big difference.  Leg will feel a lot better after taking it.  Went to get lumbar epidural steroid injection 05/04/16, Dr. Ernestina Patches.  It did help.  Doing balance class, swimming and PT.    She is to take her  lisinopril 5mg  daily.  Takes an additional if >140.   She is taking it as needed.  She checks her bp twice a day.  Reports that her bps are higher in the evenings, but she's only had to take her lisinopril in the evenings 6 times and mornings 2 times since her appt in Jan.  Admits that her salt intake is higher in the evening when she eats out.  She does not cook.    Past Medical History:  Diagnosis Date  . Anemia, unspecified   . B12 deficiency due to diet   . Cramp of limb   . Edema of both legs   . Encounter for long-term (current) use of other medications   . Lumbago   . Obesity   . Osteoarthrosis, unspecified whether generalized or localized, unspecified site   . Restless legs syndrome (RLS)   . Senile osteoporosis    Past Surgical History:  Procedure Laterality Date  . CATARACT EXTRACTION W/ INTRAOCULAR LENS  IMPLANT, BILATERAL  04/2014  . [place]  2005  . STOMACH SURGERY  1981   stapleing     reports that she quit smoking about 41 years ago. She has never used smokeless tobacco. She reports that she does not drink alcohol or use drugs.  Functional Status Survey:  See AWV  independent in all adls  Family History  Problem Relation Age of Onset  . Cancer  Sister   . Cancer Brother     Health Maintenance  Topic Date Due  . TETANUS/TDAP  09/05/2018  . INFLUENZA VACCINE  Completed  . DEXA SCAN  Completed  . PNA vac Low Risk Adult  Completed    Allergies  Allergen Reactions  . Prolia [Denosumab] Other (See Comments)    Chest pain  . Percocet [Oxycodone-Acetaminophen]     Confusion   . Shrimp [Shellfish Allergy]     All seafood    Allergies as of 05/27/2016      Reactions   Prolia [denosumab] Other (See Comments)   Chest pain   Percocet [oxycodone-acetaminophen]    Confusion   Shrimp [shellfish Allergy]    All seafood      Medication List       Accurate as of 05/27/16  8:55 AM. Always use your most recent med list.          Calcium + D3 600-200  MG-UNIT Tabs Take one tablet three times daily   celecoxib 100 MG capsule Commonly known as:  CELEBREX Take 100 mg by mouth daily.   gabapentin 100 MG capsule Commonly known as:  NEURONTIN Take 1 capsule (100 mg total) by mouth 3 (three) times daily.   Iron 66 MG Tabs Take one tablet once daily   lisinopril 5 MG tablet Commonly known as:  PRINIVIL,ZESTRIL TAKE 1 TABLET DAILY (IF YOUR BLOOD PRESSURE IS OVER 140/90, TAKE AN ADDITIONAL 5 MG TABLET)   multivitamin tablet Take 1 tablet by mouth daily.   Vitamin D 2000 units tablet Take 2,000 Units by mouth daily.       Review of Systems  Constitutional: Negative for chills, fever and malaise/fatigue.  HENT: Negative for congestion and hearing loss.   Eyes:       Glasses; eye appt next week  Respiratory: Negative for cough and shortness of breath.   Cardiovascular: Negative for chest pain, palpitations, orthopnea, claudication, leg swelling and PND.  Gastrointestinal: Negative for abdominal pain, blood in stool, constipation, diarrhea, heartburn, melena, nausea and vomiting.  Genitourinary: Negative for dysuria, frequency and urgency.       Urgency resolved after epidural injection for her back  Musculoskeletal: Positive for back pain and myalgias. Negative for joint pain.       Weakness of hips when balancing; back pain improved after epidural and with use of gabapentin; leg cramps at night  Skin: Negative for itching and rash.       Cold sore upper lip  Neurological: Positive for tingling and sensory change. Negative for dizziness, loss of consciousness and weakness.       Radiculopathy left greater than right thigh  Endo/Heme/Allergies: Does not bruise/bleed easily.  Psychiatric/Behavioral: Negative for depression and memory loss. The patient is not nervous/anxious and does not have insomnia.     Vitals:   05/27/16 0845  BP: 126/80  Pulse: 64  Resp: 12  Weight: 160 lb (72.6 kg)  Height: 5\' 1"  (1.549 m)   Body mass  index is 30.23 kg/m. Physical Exam  Constitutional: She is oriented to person, place, and time. She appears well-developed and well-nourished. No distress.  HENT:  Head: Normocephalic and atraumatic.  Right Ear: External ear normal.  Left Ear: External ear normal.  Nose: Nose normal.  Mouth/Throat: Oropharynx is clear and moist. No oropharyngeal exudate.  Cold sore upper lip  Eyes: Conjunctivae and EOM are normal. Pupils are equal, round, and reactive to light. Right eye exhibits no discharge. Left eye exhibits  no discharge. No scleral icterus.  Neck: Normal range of motion. Neck supple. No JVD present. No tracheal deviation present. No thyromegaly present.  Cardiovascular: Normal rate, regular rhythm, normal heart sounds and intact distal pulses.  Exam reveals no gallop and no friction rub.   No murmur heard. Pulmonary/Chest: Effort normal and breath sounds normal. No stridor. No respiratory distress. Right breast exhibits no inverted nipple, no mass, no nipple discharge, no skin change and no tenderness. Left breast exhibits no inverted nipple, no mass, no nipple discharge, no skin change and no tenderness.  Left breast larger than right  Abdominal: Soft. Bowel sounds are normal. She exhibits no distension and no mass. There is no tenderness. There is no rebound and no guarding. No hernia.  Musculoskeletal: Normal range of motion.  Wide based wobbly gait; mild tenderness of left knee over patella, bilateral SI buttock, right shoulder with small ecchymoses  Lymphadenopathy:    She has no cervical adenopathy.  Neurological: She is alert and oriented to person, place, and time. No cranial nerve deficit.  DTRs could not be obtained  Skin: Skin is warm and dry. Capillary refill takes less than 2 seconds.  Ecchymoses right shoulder, cold sore upper lip  Psychiatric: She has a normal mood and affect. Her behavior is normal. Judgment and thought content normal.   Labs reviewed: Basic Metabolic  Panel:  Recent Labs  11/16/15 0916 05/20/16 0825  NA 140 141  K 4.7 4.3  CL 106 104  CO2 26 23  GLUCOSE 90 92  BUN 23 28*  CREATININE 1.13* 1.13*  CALCIUM 9.9 9.9   Liver Function Tests:  Recent Labs  11/16/15 0916 05/20/16 0825  AST 33 31  ALT 20 20  ALKPHOS 43 62  BILITOT 0.4 0.4  PROT 6.9 6.9  ALBUMIN 4.3 4.0   No results for input(s): LIPASE, AMYLASE in the last 8760 hours. No results for input(s): AMMONIA in the last 8760 hours. CBC:  Recent Labs  11/16/15 0916 05/20/16 0825  WBC 5.0 6.8  NEUTROABS 2,750 4,148  HGB 11.2* 11.1*  HCT 34.7* 33.8*  MCV 92.0 90.1  PLT 273 309   Cardiac Enzymes: No results for input(s): CKTOTAL, CKMB, CKMBINDEX, TROPONINI in the last 8760 hours. BNP: Invalid input(s): POCBNP Lab Results  Component Value Date   HGBA1C 5.6 03/04/2013   No results found for: TSH Lab Results  Component Value Date   VITAMINB12 295 03/04/2013   Lab Results  Component Value Date   FOLATE >19.9 03/04/2013   Lab Results  Component Value Date   IRON 71 05/20/2016   TIBC 370 05/20/2016   FERRITIN 92 05/20/2016    Imaging and Procedures Recently: Reviewed notes from orthopedics, AWV EKG NSR at 65bpm  Assessment/Plan 1. Essential hypertension, benign -bp at goal much of the time w/o lisinopril, but has times she eats salty foods and needs to take the 5mg --this seems to work well for her -her bp machine has been calibrated and seems to be accurate - EKG 12-Lead was unremarkable  2. Senile osteoporosis - cont ca with D and additional D, did not tolerate prolia -I'd like her to be on something additional considering her two recent falls and patellar fracture - DG Bone Density; Future ordered and has upcoming mammo also at [facility]  3. Primary osteoarthritis involving multiple joints -ongoing in shoulders, hips, knees  4. Low back pain potentially associated with radiculopathy - doing better after epidural injection and addition of  gabapentin for her radiculopathy -  advised to continue this and sent to mail order - gabapentin (NEURONTIN) 100 MG capsule; Take 1 capsule (100 mg total) by mouth 3 (three) times daily.  Dispense: 90 capsule; Refill: 3  5. Closed sleeve fracture of left patella, sequela -healing now and walking much better with little residual pain  6. Leg cramps, sleep related -ongoing, has chronic iron deficiency anemia with low normal ferritin, continues on iron supplement  7. Overweight -ongoing problem for her, cont to work on weight loss through exercise and better food choices--does eat out regularly b/c she does not cook so counseled on sodium intake which has been affecting her bp  Labs/tests ordered: EKG, bone density,   Vinessa Macconnell L. Dezyrae Kensinger, D.O. [facility] [address], [place] [address] Cell Phone (Mon-Fri 8am-5pm):  [phone] On Call:  [phone] & follow prompts after 5pm & weekends Office Phone:  [phone] Office Fax:  [phone]

## 2016-05-27 NOTE — Telephone Encounter (Signed)
[facility]

## 2016-05-27 NOTE — Patient Instructions (Signed)
Take the 5mg  lisinopril if bp in morning or evening is 0000000 systolic. Take the gabapentin ongoing.   Don't worry about muscle spasm medication.

## 2016-05-30 ENCOUNTER — Other Ambulatory Visit: Payer: Self-pay | Admitting: *Deleted

## 2016-05-30 DIAGNOSIS — M545 Low back pain, unspecified: Secondary | ICD-10-CM

## 2016-05-30 MED ORDER — GABAPENTIN 100 MG PO CAPS: 100.0000 mg | ORAL_CAPSULE | Freq: Three times a day (TID) | ORAL | 3 refills | Status: DC

## 2016-05-30 NOTE — Telephone Encounter (Signed)
[facility]

## 2016-05-31 DIAGNOSIS — H5203 Hypermetropia, bilateral: Secondary | ICD-10-CM | POA: Diagnosis not present

## 2016-05-31 DIAGNOSIS — H524 Presbyopia: Secondary | ICD-10-CM | POA: Diagnosis not present

## 2016-05-31 DIAGNOSIS — H1851 Endothelial corneal dystrophy: Secondary | ICD-10-CM | POA: Diagnosis not present

## 2016-05-31 DIAGNOSIS — Z961 Presence of intraocular lens: Secondary | ICD-10-CM | POA: Diagnosis not present

## 2016-05-31 DIAGNOSIS — H52223 Regular astigmatism, bilateral: Secondary | ICD-10-CM | POA: Diagnosis not present

## 2016-06-27 ENCOUNTER — Encounter: Payer: Self-pay | Admitting: Internal Medicine

## 2016-06-27 DIAGNOSIS — Z1231 Encounter for screening mammogram for malignant neoplasm of breast: Secondary | ICD-10-CM | POA: Diagnosis not present

## 2016-06-27 DIAGNOSIS — M8589 Other specified disorders of bone density and structure, multiple sites: Secondary | ICD-10-CM | POA: Diagnosis not present

## 2016-06-27 DIAGNOSIS — M810 Age-related osteoporosis without current pathological fracture: Secondary | ICD-10-CM | POA: Diagnosis not present

## 2016-06-27 LAB — HM MAMMOGRAPHY

## 2016-07-04 ENCOUNTER — Ambulatory Visit (INDEPENDENT_AMBULATORY_CARE_PROVIDER_SITE_OTHER): Payer: Medicare Other | Admitting: Orthopedic Surgery

## 2016-07-04 ENCOUNTER — Ambulatory Visit (INDEPENDENT_AMBULATORY_CARE_PROVIDER_SITE_OTHER): Payer: Medicare Other | Admitting: Nurse Practitioner

## 2016-07-04 ENCOUNTER — Encounter: Payer: Self-pay | Admitting: Nurse Practitioner

## 2016-07-04 ENCOUNTER — Encounter: Payer: Self-pay | Admitting: *Deleted

## 2016-07-04 ENCOUNTER — Encounter (INDEPENDENT_AMBULATORY_CARE_PROVIDER_SITE_OTHER): Payer: Self-pay | Admitting: Orthopedic Surgery

## 2016-07-04 VITALS — BP 124/72 | HR 87 | Temp 98.8°F | Resp 17 | Ht 61.0 in | Wt 164.2 lb

## 2016-07-04 VITALS — Ht 61.0 in | Wt 164.0 lb

## 2016-07-04 DIAGNOSIS — M5417 Radiculopathy, lumbosacral region: Secondary | ICD-10-CM

## 2016-07-04 DIAGNOSIS — M25562 Pain in left knee: Secondary | ICD-10-CM | POA: Diagnosis not present

## 2016-07-04 DIAGNOSIS — G8929 Other chronic pain: Secondary | ICD-10-CM | POA: Diagnosis not present

## 2016-07-04 DIAGNOSIS — J00 Acute nasopharyngitis [common cold]: Secondary | ICD-10-CM | POA: Diagnosis not present

## 2016-07-04 DIAGNOSIS — M7541 Impingement syndrome of right shoulder: Secondary | ICD-10-CM | POA: Insufficient documentation

## 2016-07-04 DIAGNOSIS — M5416 Radiculopathy, lumbar region: Secondary | ICD-10-CM

## 2016-07-04 DIAGNOSIS — M25561 Pain in right knee: Secondary | ICD-10-CM

## 2016-07-04 NOTE — Progress Notes (Signed)
Careteam: Patient Care Team: Gayland Curry, DO as PCP - General (Geriatric Medicine) Marica Otter, OD as Consulting Physician (Optometry) Wallene Huh, DPM as Consulting Physician (Podiatry) Pixie Casino, MD as Consulting Physician (Cardiology)  Advanced Directive information Does Patient Have a Medical Advance Directive?: Yes, Type of Advance Directive: Healthcare Power of Attorney  Allergies  Allergen Reactions  . Prolia [Denosumab] Other (See Comments)    Chest pain  . Percocet [Oxycodone-Acetaminophen]     Confusion   . Shrimp [Shellfish Allergy]     All seafood    Chief Complaint  Patient presents with  . Acute Visit    Pt states that cough/conjestion began 2-3 days ago. Pt has taken mucinex for cough     HPI: Patient is a 76 y.o. female seen in the office today due to cough and congestion for 3 days. Husband was sick prior. No fevers or chills. No shortness of breath. No chest pains.  positive for rhinnorhea, congestion, sneezing Denies sinus or chest congestion, pain, post nasal drip Tried mucinex but unsure which one last night Cough bothers her the most.  Review of Systems:  Review of Systems  Constitutional: Positive for fatigue. Negative for activity change, appetite change, chills and unexpected weight change.  HENT: Positive for congestion, rhinorrhea, sneezing, sore throat and voice change. Negative for hearing loss, postnasal drip, sinus pain and sinus pressure.   Eyes: Negative.   Respiratory: Positive for cough. Negative for shortness of breath and wheezing.   Cardiovascular: Negative for chest pain and palpitations.  Gastrointestinal: Negative for abdominal pain, constipation and diarrhea.  Genitourinary: Negative for difficulty urinating and dysuria.  Musculoskeletal: Negative for arthralgias and myalgias.  Skin: Negative for color change and wound.  Neurological: Negative for dizziness, weakness and light-headedness.    Past Medical  History:  Diagnosis Date  . Anemia, unspecified   . B12 deficiency due to diet   . Cramp of limb   . Edema of both legs   . Encounter for long-term (current) use of other medications   . Lumbago   . Obesity   . Osteoarthrosis, unspecified whether generalized or localized, unspecified site   . Restless legs syndrome (RLS)   . Senile osteoporosis    Past Surgical History:  Procedure Laterality Date  . CATARACT EXTRACTION W/ INTRAOCULAR LENS  IMPLANT, BILATERAL  04/2014  . [place]  2005  . STOMACH SURGERY  1981   stapleing    Social History:   reports that she quit smoking about 41 years ago. She has never used smokeless tobacco. She reports that she does not drink alcohol or use drugs.  Family History  Problem Relation Age of Onset  . Cancer Sister   . Cancer Brother     Medications: Patient's Medications  New Prescriptions   No medications on file  Previous Medications   CALCIUM CARB-CHOLECALCIFEROL (CALCIUM + D3) 600-200 MG-UNIT TABS    Take one tablet three times daily   CELECOXIB (CELEBREX) 100 MG CAPSULE    Take 100 mg by mouth daily.   CHOLECALCIFEROL (VITAMIN D) 2000 UNITS TABLET    Take 2,000 Units by mouth daily.   GABAPENTIN (NEURONTIN) 100 MG CAPSULE    Take 1 capsule (100 mg total) by mouth 3 (three) times daily.   IRON 66 MG TABS    Take one tablet once daily   LISINOPRIL (PRINIVIL,ZESTRIL) 5 MG TABLET    TAKE 1 TABLET DAILY (IF YOUR BLOOD PRESSURE IS OVER 140/90,  TAKE AN ADDITIONAL 5 MG TABLET)   MULTIPLE VITAMIN (MULTIVITAMIN) TABLET    Take 1 tablet by mouth daily.  Modified Medications   No medications on file  Discontinued Medications   No medications on file     Physical Exam:  Vitals:   07/04/16 1416  BP: 124/72  Pulse: 87  Resp: 17  Temp: 98.8 F (37.1 C)  TempSrc: Oral  SpO2: 97%  Weight: 164 lb 3.2 oz (74.5 kg)  Height: 5\' 1"  (1.549 m)   Body mass index is 31.03 kg/m.  Physical Exam  Constitutional: She is oriented to person,  place, and time. She appears well-developed and well-nourished. No distress.  HENT:  Head: Normocephalic and atraumatic.  Mouth/Throat: Oropharynx is clear and moist. No oropharyngeal exudate.  Eyes: Conjunctivae are normal. Pupils are equal, round, and reactive to light.  Neck: Normal range of motion. Neck supple.  Cardiovascular: Normal rate, regular rhythm and normal heart sounds.   Pulmonary/Chest: Effort normal and breath sounds normal.  Abdominal: Soft. Bowel sounds are normal. She exhibits no distension.  Neurological: She is alert and oriented to person, place, and time.  Skin: Skin is warm and dry. She is not diaphoretic.  Psychiatric: She has a normal mood and affect.    Labs reviewed: Basic Metabolic Panel:  Recent Labs  11/16/15 0916 05/20/16 0825  NA 140 141  K 4.7 4.3  CL 106 104  CO2 26 23  GLUCOSE 90 92  BUN 23 28*  CREATININE 1.13* 1.13*  CALCIUM 9.9 9.9   Liver Function Tests:  Recent Labs  11/16/15 0916 05/20/16 0825  AST 33 31  ALT 20 20  ALKPHOS 43 62  BILITOT 0.4 0.4  PROT 6.9 6.9  ALBUMIN 4.3 4.0   No results for input(s): LIPASE, AMYLASE in the last 8760 hours. No results for input(s): AMMONIA in the last 8760 hours. CBC:  Recent Labs  11/16/15 0916 05/20/16 0825  WBC 5.0 6.8  NEUTROABS 2,750 4,148  HGB 11.2* 11.1*  HCT 34.7* 33.8*  MCV 92.0 90.1  PLT 273 309   Lipid Panel:  Recent Labs  11/16/15 0916 05/20/16 0825  CHOL 173 184  HDL 65 74  LDLCALC 86 95  TRIG 108 74  CHOLHDL 2.7 2.5   TSH: No results for input(s): TSH in the last 8760 hours. A1C: Lab Results  Component Value Date   HGBA1C 5.6 03/04/2013     Assessment/Plan 1. Acute nasopharyngitis -supportive care -mucinex DM PO BID with full glass of water -increase hydration -return precautions discussed.   Carlos American. Harle Battiest  [facility] [phone] 8 am - 5 pm) [phone] (after hours)

## 2016-07-04 NOTE — Progress Notes (Signed)
Office Visit Note   Patient: Jody [name]           Date of Birth: 1941/03/14           MRN: [id number] Visit Date: 07/04/2016              Requested by: Gayland Curry, DO [place], [place] [address] PCP: Hollace Kinnier, DO  Chief Complaint  Patient presents with  . Lower Back - Pain      HPI: Patient is a 76 year old woman presents with multiple medical problems. She states the epidural steroid injection with Dr. Ernestina Patches in February helped her a little but she states she still has left-sided radicular pain and burning pain in both feet and she states her feet feel cold. She states that her left leg feels weak and radicular tingling occasionally. She complains of bilateral knee pain which is resolved by Biofreeze. Patient is also complaining of increasing impingement symptoms of the right shoulder. She states she can only walk 5 laps in the gym she does water aerobics she does balance and strength classes does chair aerobics and she is doing physical therapy with [place].  Assessment & Plan: Visit Diagnoses:  1. 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 Impingement syndrome of right shoulder   3. Chronic pain of both knees     Plan: Recommended orthotics for her feet she has a good  Over-the-counter orthotics recommended that physical therapy work with the right shoulder for scapular stabilization and internal/external rotation strengthening. Discussed that if her radicular pain on the left side worsen she can follow up with Dr. Ernestina Patches for an epidural steroid injection and if her shoulder symptoms worsen she can follow up we can consider a subacromial injection for the right shoulder. Recommended continuing her physical therapy  Follow-Up Instructions: Return if symptoms worsen or fail to improve.   Ortho Exam  Patient is alert, oriented, no adenopathy, well-dressed, normal affect, normal respiratory effort. Examination patient has  difficulty getting from a sitting to a standing position. She has negative straight leg raise bilaterally she has no focal motor weakness in either lower extremity. She does have some pain with Neer and Hawkins impingement test of the right shoulder. She has no focal weakness. She has good range of motion of both knees. No extensor lag of the left knee.  Imaging: No results found.  Labs: Lab Results  Component Value Date   HGBA1C 5.6 03/04/2013    Orders:  No orders of the defined types were placed in this encounter.  No orders of the defined types were placed in this encounter.    Procedures: No procedures performed  Clinical Data: No additional findings.  ROS:  All other systems negative, except as noted in the HPI. Review of Systems  Objective: Vital Signs: Ht 5\' 1"  (1.549 m)   Wt 164 lb (74.4 kg)   BMI 30.99 kg/m   Specialty Comments:  No specialty comments available.  PMFS History: Patient Active Problem List   Diagnosis Date Noted  . Impingement syndrome of right shoulder 07/04/2016  . Chronic pain of both knees 07/04/2016  . 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 Displaced transverse fracture of left patella, subsequent encounter for closed fracture with delayed healing 02/03/2016  . Finger fracture, left 02/03/2016  . Leg cramps, sleep related 11/19/2015  . Absolute anemia 05/16/2014  . Primary osteoarthritis involving multiple joints 05/16/2014  .  Cataract cortical, senile 05/16/2014  . Essential hypertension, benign 09/03/2012  . Edema of both legs   . Senile osteoporosis   . B12 deficiency due to diet    Past Medical History:  Diagnosis Date  . Anemia, unspecified   . B12 deficiency due to diet   . Cramp of limb   . Edema of both legs   . Encounter for long-term (current) use of other medications   . Lumbago   . Obesity   . Osteoarthrosis, unspecified whether generalized or localized, unspecified site    . Restless legs syndrome (RLS)   . Senile osteoporosis     Family History  Problem Relation Age of Onset  . Cancer Sister   . Cancer Brother     Past Surgical History:  Procedure Laterality Date  . CATARACT EXTRACTION W/ INTRAOCULAR LENS  IMPLANT, BILATERAL  04/2014  . [place]  2005  . STOMACH SURGERY  1981   stapleing    Social History   Occupational History  . retired Administrator     Social History Main Topics  . Smoking status: Former Smoker    Quit date: 05/22/1975  . Smokeless tobacco: Never Used     Comment: Quit in early 46's  . Alcohol use No  . Drug use: No  . Sexual activity: No

## 2016-07-04 NOTE — Patient Instructions (Signed)
To use mucinex DM by mouth twice daily with FULL glass of water for 1 week then as needed cough and congestion Make sure to stay hydrated by drinking plenty of fluid  If your symptoms persist past 7-10 days or worsen after they improve to notify the office    Upper Respiratory Infection, Adult Most upper respiratory infections (URIs) are a viral infection of the air passages leading to the lungs. A URI affects the nose, throat, and upper air passages. The most common type of URI is nasopharyngitis and is typically referred to as "the common cold." URIs run their course and usually go away on their own. Most of the time, a URI does not require medical attention, but sometimes a bacterial infection in the upper airways can follow a viral infection. This is called a secondary infection. Sinus and middle ear infections are common types of secondary upper respiratory infections. Bacterial pneumonia can also complicate a URI. A URI can worsen asthma and chronic obstructive pulmonary disease (COPD). Sometimes, these complications can require emergency medical care and may be life threatening. What are the causes? Almost all URIs are caused by viruses. A virus is a type of germ and can spread from one person to another. What increases the risk? You may be at risk for a URI if:  You smoke.  You have chronic heart or lung disease.  You have a weakened defense (immune) system.  You are very young or very old.  You have nasal allergies or asthma.  You work in crowded or poorly ventilated areas.  You work in health care facilities or schools. What are the signs or symptoms? Symptoms typically develop 2-3 days after you come in contact with a cold virus. Most viral URIs last 7-10 days. However, viral URIs from the influenza virus (flu virus) can last 14-18 days and are typically more severe. Symptoms may include:  Runny or stuffy (congested) nose.  Sneezing.  Cough.  Sore  throat.  Headache.  Fatigue.  Fever.  Loss of appetite.  Pain in your forehead, behind your eyes, and over your cheekbones (sinus pain).  Muscle aches. How is this diagnosed? Your health care provider may diagnose a URI by:  Physical exam.  Tests to check that your symptoms are not due to another condition such as:  Strep throat.  Sinusitis.  Pneumonia.  Asthma. How is this treated? A URI goes away on its own with time. It cannot be cured with medicines, but medicines may be prescribed or recommended to relieve symptoms. Medicines may help:  Reduce your fever.  Reduce your cough.  Relieve nasal congestion. Follow these instructions at home:  Take medicines only as directed by your health care provider.  Gargle warm saltwater or take cough drops to comfort your throat as directed by your health care provider.  Use a warm mist humidifier or inhale steam from a shower to increase air moisture. This may make it easier to breathe.  Drink enough fluid to keep your urine clear or pale yellow.  Eat soups and other clear broths and maintain good nutrition.  Rest as needed.  Return to work when your temperature has returned to normal or as your health care provider advises. You may need to stay home longer to avoid infecting others. You can also use a face mask and careful hand washing to prevent spread of the virus.  Increase the usage of your inhaler if you have asthma.  Do not use any tobacco products, including cigarettes,  chewing tobacco, or electronic cigarettes. If you need help quitting, ask your health care provider. How is this prevented? The best way to protect yourself from getting a cold is to practice good hygiene.  Avoid oral or hand contact with people with cold symptoms.  Wash your hands often if contact occurs. There is no clear evidence that vitamin C, vitamin E, echinacea, or exercise reduces the chance of developing a cold. However, it is always  recommended to get plenty of rest, exercise, and practice good nutrition. Contact a health care provider if:  You are getting worse rather than better.  Your symptoms are not controlled by medicine.  You have chills.  You have worsening shortness of breath.  You have brown or red mucus.  You have yellow or brown nasal discharge.  You have pain in your face, especially when you bend forward.  You have a fever.  You have swollen neck glands.  You have pain while swallowing.  You have white areas in the back of your throat. Get help right away if:  You have severe or persistent:  Headache.  Ear pain.  Sinus pain.  Chest pain.  You have chronic lung disease and any of the following:  Wheezing.  Prolonged cough.  Coughing up blood.  A change in your usual mucus.  You have a stiff neck.  You have changes in your:  Vision.  Hearing.  Thinking.  Mood. This information is not intended to replace advice given to you by your health care provider. Make sure you discuss any questions you have with your health care provider. Document Released: 09/07/2000 Document Revised: 11/15/2015 Document Reviewed: 06/19/2013 Elsevier Interactive Patient Education  2017 [employer].

## 2016-07-19 ENCOUNTER — Encounter: Payer: Self-pay | Admitting: Internal Medicine

## 2016-08-02 ENCOUNTER — Encounter: Payer: Self-pay | Admitting: Internal Medicine

## 2016-08-08 ENCOUNTER — Ambulatory Visit (INDEPENDENT_AMBULATORY_CARE_PROVIDER_SITE_OTHER): Payer: Medicare Other | Admitting: Podiatry

## 2016-08-08 ENCOUNTER — Ambulatory Visit (INDEPENDENT_AMBULATORY_CARE_PROVIDER_SITE_OTHER): Payer: Medicare Other

## 2016-08-08 DIAGNOSIS — M79671 Pain in right foot: Secondary | ICD-10-CM

## 2016-08-08 DIAGNOSIS — M779 Enthesopathy, unspecified: Secondary | ICD-10-CM

## 2016-08-08 DIAGNOSIS — M79672 Pain in left foot: Secondary | ICD-10-CM

## 2016-08-09 NOTE — Progress Notes (Signed)
Subjective:    Patient ID: Jody [name], female   DOB: 76 y.o.   MRN: [id number]   HPI patient presents stating that for the last few years she's had chronic pain in her forefoot bilateral and it has been gradually getting worse with history of fasciitis that's doing okay. Feels like she's walking on her bones    Review of Systems  All other systems reviewed and are negative.       Objective:  Physical Exam  Cardiovascular: Intact distal pulses.   Musculoskeletal: Normal range of motion.  Neurological: She is alert.  Skin: Skin is warm and dry.  Nursing note and vitals reviewed.  neurovascular status found to be mildly diminished but intact with patient found to have significant cavus foot structure bilateral with prominent metatarsals 1 through 5 of both feet with diminished fat pad noted upon palpation. 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 Also we are getting to try to make her a super soft type orthotic and I did refer her to pad orthotist for this to be done and we will get this done in the next several weeks

## 2016-08-15 ENCOUNTER — Encounter: Payer: Self-pay | Admitting: Internal Medicine

## 2016-08-18 ENCOUNTER — Other Ambulatory Visit: Payer: Medicare Other

## 2016-08-19 ENCOUNTER — Ambulatory Visit (INDEPENDENT_AMBULATORY_CARE_PROVIDER_SITE_OTHER): Payer: Self-pay | Admitting: Podiatry

## 2016-08-19 DIAGNOSIS — Q6671 Congenital pes cavus, right foot: Secondary | ICD-10-CM

## 2016-08-19 DIAGNOSIS — Q667 Congenital pes cavus: Secondary | ICD-10-CM

## 2016-08-19 NOTE — Progress Notes (Signed)
Saw patient today for casting for F/O....patient presents with very cavus foot type, thinning fat pad bilaterally....she complains that forefoot pain makes it difficult to walk.Marland KitchenMarland KitchenMarland KitchenScanned her for new soft f/o w/ bilat dancers pad, hug arch, and spenco topcover....   [id number].Marland KitchenMarland Kitchen

## 2016-08-23 ENCOUNTER — Other Ambulatory Visit: Payer: Medicare Other

## 2016-09-05 ENCOUNTER — Ambulatory Visit (INDEPENDENT_AMBULATORY_CARE_PROVIDER_SITE_OTHER): Payer: Medicare Other | Admitting: Internal Medicine

## 2016-09-05 ENCOUNTER — Encounter: Payer: Self-pay | Admitting: Internal Medicine

## 2016-09-05 VITALS — BP 110/70 | HR 70 | Temp 98.2°F | Wt 167.0 lb

## 2016-09-05 DIAGNOSIS — M5116 Intervertebral disc disorders with radiculopathy, lumbar region: Secondary | ICD-10-CM

## 2016-09-05 DIAGNOSIS — M79672 Pain in left foot: Secondary | ICD-10-CM

## 2016-09-05 DIAGNOSIS — M48062 Spinal stenosis, lumbar region with neurogenic claudication: Secondary | ICD-10-CM

## 2016-09-05 DIAGNOSIS — G4762 Sleep related leg cramps: Secondary | ICD-10-CM | POA: Diagnosis not present

## 2016-09-05 DIAGNOSIS — M810 Age-related osteoporosis without current pathological fracture: Secondary | ICD-10-CM | POA: Diagnosis not present

## 2016-09-05 MED ORDER — ALENDRONATE SODIUM 70 MG PO TABS: 70.0000 mg | ORAL_TABLET | ORAL | 3 refills | Status: DC

## 2016-09-05 NOTE — Progress Notes (Signed)
Location:  [facility] clinic Provider:  Hillery Bhalla L. Mariea Clonts, D.O., C.M.D.  Code Status: DNR Goals of Care:  Advanced Directives 09/05/2016  Does Patient Have a Medical Advance Directive? Yes  Type of Advance Directive [place]  Does patient want to make changes to medical advance directive? -  Copy of [place] in Chart? Yes   Chief Complaint  Patient presents with  . Follow-up    discuss knee pain, leg pain and feet pain    HPI: Patient is a 76 y.o. female seen today for medical management of chronic diseases.    In March she took lisinopril if over 140/90.  Took it 12 times in March, 11 times in April, 14 times in May, 6/11 times in June so far.  Checking twice a day.  Never has required it twice a day.  Took this am, but now 110.    Marginally better in the past 2 days with her pain.  Takes celebrex 100mg  daily.    Osteoporosis:  Wrist so bad at -4.7.  Did fosamax long ago then forteo. Got chest pain with prolia.    She is doing chair yoga.  Wonders if her hands on the chair   Wonders why she's having the pain she's having now.  Left foot, on ball of foot, Dr. Paulla Dolly said she'd had similar concerns in the past.  Diona Foley of foot hurts so badly that it feels like her socks are bunched up.  Has high arches so she has a lot of pressure on the balls of her feet.  Weight up a little more now.  Would love to be 158, but 167 now due to inactivity.  Is going to try the orthotics per podiatry.  Her ankle area is the biggest problem.    Burning, stabbing, has to put feet up, has to sit or go back to the car and get off her feet.  Happens many times during the day.  Has to favor that ankle and has to get pressure off of it.  Left patella area is painful. Calf and thigh almost all the way up areas of both legs are very sensitive to touch to where putting lotion on them hurts.  Sometimes has swelling with her high sodium purchased packaged foods. Left leg hurts more.  All  since November when she broke the knee cap.  Has to walk with a rocking motion not stride.  Using a cane all the time.  That helps her not to rock and have more of a regular stride.  Over the weekend and today, she's been able to walk a little more normal like car to house and in the house.  She is faithfully taking 2 gabapentin in the morning, at noon and 1 at night.  Notices left hip weakness on left during balance class.  This has totally disrupted her life.  Doesn't want to be on medicine the rest of her life.  Right now, everything she does, she wonders how far she has to walk and asked for handicapped plaquard.  Constant awareness of a problem.  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    Sometimes lower back does hurt.  Had one steroid injection that helped.  Not stiff.  Rarely thinks about her back.  Is doing her water aerobics and classes, but can't walk any distance--not sure she can go around the block.  Might be able to do three laps  in the gym.  Pain worst with activity.  No problems during sleep.  Fewer leg cramps also and thinks due to gabapentin.  She has not noticed side effects of the gabapentin.    Goes to PT.  Has gotten some dry needling.  She's on maintenance with PT.    Past Medical History:  Diagnosis Date  . Anemia, unspecified   . B12 deficiency due to diet   . Cramp of limb   . Edema of both legs   . Encounter for long-term (current) use of other medications   . Lumbago   . Obesity   . Osteoarthrosis, unspecified whether generalized or localized, unspecified site   . Restless legs syndrome (RLS)   . Senile osteoporosis     Past Surgical History:  Procedure Laterality Date  . CATARACT EXTRACTION W/ INTRAOCULAR LENS  IMPLANT, BILATERAL  04/2014  . Agency  2005  . STOMACH SURGERY  1981   stapleing     Allergies  Allergen Reactions  . Prolia [Denosumab] Other (See Comments)    Chest pain  . Percocet  [Oxycodone-Acetaminophen]     Confusion   . Shrimp [Shellfish Allergy]     All seafood    Allergies as of 09/05/2016      Reactions   Prolia [denosumab] Other (See Comments)   Chest pain   Percocet [oxycodone-acetaminophen]    Confusion   Shrimp [shellfish Allergy]    All seafood      Medication List       Accurate as of 09/05/16 11:59 PM. Always use your most recent med list.          alendronate 70 MG tablet Commonly known as:  FOSAMAX Take 1 tablet (70 mg total) by mouth every 7 (seven) days. Take with a full glass of water on an empty stomach.   Calcium + D3 600-200 MG-UNIT Tabs Take one tablet three times daily   celecoxib 100 MG capsule Commonly known as:  CELEBREX Take 100 mg by mouth daily.   gabapentin 100 MG capsule Commonly known as:  NEURONTIN Take 2 capsules ( 200 mg)  by mouth in am, 300mg   at lunch, then take 1 capsule (100 mg) in the evening   Iron 66 MG Tabs Take one tablet once daily   lisinopril 5 MG tablet Commonly known as:  PRINIVIL,ZESTRIL Take 1 tablet by mouth if your blood pressure is 140/90   multivitamin tablet Take 1 tablet by mouth daily.   Vitamin D 2000 units tablet Take 2,000 Units by mouth daily.       Review of Systems:  Review of Systems  Constitutional: Negative for chills, fever and malaise/fatigue.  HENT: Negative for congestion and hearing loss.   Eyes: Negative for blurred vision.  Respiratory: Negative for shortness of breath.   Cardiovascular: Negative for chest pain, palpitations and leg swelling.  Gastrointestinal: Negative for abdominal pain.  Genitourinary: Negative for dysuria.  Musculoskeletal: Positive for back pain and myalgias. Negative for falls.  Skin: Negative for itching and rash.  Neurological: Positive for tingling and sensory change. Negative for dizziness and weakness.  Psychiatric/Behavioral: Negative for depression and memory loss. The patient is not nervous/anxious and does not have  insomnia.     Health Maintenance  Topic Date Due  . INFLUENZA VACCINE  10/26/2016  . MAMMOGRAM  06/27/2017  . TETANUS/TDAP  09/05/2018  . DEXA SCAN  Completed  . PNA vac Low Risk Adult  Completed    Physical Exam: Vitals:  09/05/16 1312  BP: 110/70  Pulse: 70  Temp: 98.2 F (36.8 C)  TempSrc: Oral  SpO2: 98%  Weight: 167 lb (75.8 kg)   Body mass index is 31.55 kg/m. Physical Exam  Constitutional: She is oriented to person, place, and time. She appears well-developed and well-nourished. No distress.  Cardiovascular: Normal rate, regular rhythm, normal heart sounds and intact distal pulses.   Pulmonary/Chest: Effort normal and breath sounds normal. No respiratory distress.  Abdominal: Soft. Bowel sounds are normal. She exhibits no distension. There is no tenderness.  Musculoskeletal:  Wide-based waddling gait; tender over left patella where fx was, tenderness deep in gluteal muscles bilaterally  Neurological: She is alert and oriented to person, place, and time.  Skin: Skin is warm and dry.    Labs reviewed: Basic Metabolic Panel:  Recent Labs  11/16/15 0916 05/20/16 0825  NA 140 141  K 4.7 4.3  CL 106 104  CO2 26 23  GLUCOSE 90 92  BUN 23 28*  CREATININE 1.13* 1.13*  CALCIUM 9.9 9.9   Liver Function Tests:  Recent Labs  11/16/15 0916 05/20/16 0825  AST 33 31  ALT 20 20  ALKPHOS 43 62  BILITOT 0.4 0.4  PROT 6.9 6.9  ALBUMIN 4.3 4.0   No results for input(s): LIPASE, AMYLASE in the last 8760 hours. No results for input(s): AMMONIA in the last 8760 hours. CBC:  Recent Labs  11/16/15 0916 05/20/16 0825  WBC 5.0 6.8  NEUTROABS 2,750 4,148  HGB 11.2* 11.1*  HCT 34.7* 33.8*  MCV 92.0 90.1  PLT 273 309   Lipid Panel:  Recent Labs  11/16/15 0916 05/20/16 0825  CHOL 173 184  HDL 65 74  LDLCALC 86 95  TRIG 108 74  CHOLHDL 2.7 2.5   Lab Results  Component Value Date   HGBA1C 5.6 03/04/2013    Procedures since last visit: MRI lumbar  spine from 04/12/16:  Large L5-S1 disc extrusion displaces the traversing RIGHT L5 nerve and encroaches upon the traversing RIGHT S1 nerve.  L3-4 PLIF and posterior decompression. 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 Multilevel neural foraminal narrowing: Severe on the RIGHT at L4-5, moderate to severe on the LEFT at L5-S1.  Assessment/Plan 1. Senile osteoporosis - did not tolerate prolia with chest pains, does not want to do daily injections, so will put her on fosamax  -cont ca with D, D and weightbearing exercises and balance training - alendronate (FOSAMAX) 70 MG tablet; Take 1 tablet (70 mg total) by mouth every 7 (seven) days. Take with a full glass of water on an empty stomach.  Dispense: 12 tablet; Refill: 3  2. Spinal stenosis of lumbar region with neurogenic claudication - not a big fan of idea of surgery, but might benefit from injections?   - see MRI above - Ambulatory referral to Neurosurgery - increase gabapentin to 200mg  in am, 300mg  at lunch and 100mg  in the evening--has been tolerating well and she thinks this will be adequate, if not, may got to 300/300/100  3. Lumbar disc disease with radiculopathy - again, as in #3 - Ambulatory referral to Neurosurgery  4. Left foot pain -multifactorial--high arches, see if prosthesis helps from podiatry, neurogenic claudication from back and possibly other neuropathy, as well -increase gabapentin as above -also on celebrex which has been the only thing that works well for her OA pains  and she's aware of the risks of ongoing nsaid use  5. Leg cramps, sleep related -suspect these are due to nerve impingements in her back from disc and stenosis--has received benefit from the hs gabapentin just 100mg  which is allowing her to sleep better  Labs/tests ordered:   Orders Placed This  Encounter  Procedures  . Ambulatory referral to Neurosurgery    Referral Priority:   Routine    Referral Type:   Surgical    Referral Reason:   Specialty Services Required    Requested Specialty:   Neurosurgery    Number of Visits Requested:   1    Next appt:  11/14/2016   Maynor Mwangi L. Minah Axelrod, D.O. [facility] [address], [place] [address] Cell Phone (Mon-Fri 8am-5pm):  [phone] On Call:  [phone] & follow prompts after 5pm & weekends Office Phone:  [phone] Office Fax:  [phone]

## 2016-09-08 DIAGNOSIS — H3530 Unspecified macular degeneration: Secondary | ICD-10-CM | POA: Diagnosis not present

## 2016-09-08 DIAGNOSIS — H52223 Regular astigmatism, bilateral: Secondary | ICD-10-CM | POA: Diagnosis not present

## 2016-09-08 DIAGNOSIS — H353111 Nonexudative age-related macular degeneration, right eye, early dry stage: Secondary | ICD-10-CM | POA: Diagnosis not present

## 2016-09-08 DIAGNOSIS — H5203 Hypermetropia, bilateral: Secondary | ICD-10-CM | POA: Diagnosis not present

## 2016-09-12 ENCOUNTER — Ambulatory Visit: Payer: Medicare Other | Admitting: Orthotics

## 2016-09-13 ENCOUNTER — Encounter: Payer: Self-pay | Admitting: Internal Medicine

## 2016-09-20 ENCOUNTER — Encounter: Payer: Self-pay | Admitting: Internal Medicine

## 2016-10-10 DIAGNOSIS — I10 Essential (primary) hypertension: Secondary | ICD-10-CM | POA: Diagnosis not present

## 2016-10-10 DIAGNOSIS — M4316 Spondylolisthesis, lumbar region: Secondary | ICD-10-CM | POA: Diagnosis not present

## 2016-10-10 DIAGNOSIS — Z6832 Body mass index (BMI) 32.0-32.9, adult: Secondary | ICD-10-CM | POA: Diagnosis not present

## 2016-10-10 DIAGNOSIS — M5126 Other intervertebral disc displacement, lumbar region: Secondary | ICD-10-CM | POA: Diagnosis not present

## 2016-11-14 ENCOUNTER — Other Ambulatory Visit: Payer: Medicare Other | Admitting: Orthotics

## 2016-11-14 ENCOUNTER — Ambulatory Visit (INDEPENDENT_AMBULATORY_CARE_PROVIDER_SITE_OTHER): Payer: Medicare Other | Admitting: Internal Medicine

## 2016-11-14 ENCOUNTER — Encounter: Payer: Self-pay | Admitting: Internal Medicine

## 2016-11-14 VITALS — BP 120/78 | HR 65 | Temp 97.9°F | Wt 172.0 lb

## 2016-11-14 DIAGNOSIS — G6289 Other specified polyneuropathies: Secondary | ICD-10-CM

## 2016-11-14 DIAGNOSIS — M48062 Spinal stenosis, lumbar region with neurogenic claudication: Secondary | ICD-10-CM | POA: Diagnosis not present

## 2016-11-14 DIAGNOSIS — M810 Age-related osteoporosis without current pathological fracture: Secondary | ICD-10-CM

## 2016-11-14 DIAGNOSIS — E538 Deficiency of other specified B group vitamins: Secondary | ICD-10-CM | POA: Diagnosis not present

## 2016-11-14 NOTE — Progress Notes (Signed)
Location:  [facility] clinic Provider:  Juana Montini L. Mariea Clonts, D.O., C.M.D.  Code Status: DNR Goals of Care:  Advanced Directives 11/14/2016  Does Patient Have a Medical Advance Directive? Yes  Type of Advance Directive [place]  Does patient want to make changes to medical advance directive? -  Copy of [place] in Chart? Yes   Chief Complaint  Patient presents with  . Medical Management of Chronic Issues    5MTH FOLLOW-UP    HPI: Patient is a 76 y.o. female seen today for medical management of chronic diseases.    Still having major problems with the leg.  [facility]ent to [facility].  Got orthotics that were quite costly, but still having discomfort.  Sees them this afternoon.  Went to Dr. Cyndy Freeze who did her back surgery in 2006.  He reviewed her MRI and he suggested a myelogram which is now in November.  It's on the right side.  Unrelated to left leg pain.  Can't walk and depend on her leg.  [place] [facility]  Max she could do was 3 miles.  Uses cane.  Starts in ankle area across foot and comes up.  Does not think it's related to her knee.  Legs feel pressure and pain when she touches them vs her arms just feel pressure.  Both feet still feel like socks are bunched up.  Burning sensation and tingling feels like skin too tight for feet.    She has gained 10 lbs since January b/c she cannot move that well unless when she is behind a grocery cart.    Gabapentin does help.  Takes 200mg  in the am.  Sometimes forgets noon dose but thinks she better take it around 2 when her leg bothers her.  May take another 1 in the evening and another 1 at bedtime (or not).    Wants flu shot in November.    Past Medical History:  Diagnosis Date  . Anemia, unspecified   . B12 deficiency due to diet   . Cramp of limb   . Edema of both legs   . Encounter for long-term (current) use of other medications   . Lumbago   . Obesity   . Osteoarthrosis, unspecified whether generalized  or localized, unspecified site   . Restless legs syndrome (RLS)   . Senile osteoporosis     Past Surgical History:  Procedure Laterality Date  . CATARACT EXTRACTION W/ INTRAOCULAR LENS  IMPLANT, BILATERAL  04/2014  . [place]  2005  . STOMACH SURGERY  1981   stapleing     Allergies  Allergen Reactions  . Prolia [Denosumab] Other (See Comments)    Chest pain  . Percocet [Oxycodone-Acetaminophen]     Confusion   . Shrimp [Shellfish Allergy]     All seafood    Allergies as of 11/14/2016      Reactions   Prolia [denosumab] Other (See Comments)   Chest pain   Percocet [oxycodone-acetaminophen]    Confusion   Shrimp [shellfish Allergy]    All seafood      Medication List       Accurate as of 11/14/16  1:27 PM. Always use your most recent med list.          alendronate 70 MG tablet Commonly known as:  FOSAMAX Take 1 tablet (70 mg total) by mouth every 7 (seven) days. Take with a full glass of water on an empty stomach.   Calcium + D3 600-200  MG-UNIT Tabs Take one tablet three times daily   celecoxib 100 MG capsule Commonly known as:  CELEBREX Take 100 mg by mouth 2 (two) times daily.   gabapentin 100 MG capsule Commonly known as:  NEURONTIN Take 2 capsules ( 200 mg)  by mouth in am, 300mg   at lunch, then take 1 capsule (100 mg) in the evening   Iron 66 MG Tabs Take one tablet once daily   lisinopril 5 MG tablet Commonly known as:  PRINIVIL,ZESTRIL Take 1 tablet by mouth if your blood pressure is 140/90   multivitamin tablet Take 1 tablet by mouth daily.   Vitamin D 2000 units tablet Take 2,000 Units by mouth daily.       Review of Systems:  Review of Systems  Constitutional: Negative for chills, fever and malaise/fatigue.  HENT: Negative for congestion.   Eyes: Negative for blurred vision.  Respiratory: Negative for cough and shortness of breath.   Cardiovascular: Positive for leg swelling. Negative for chest pain and palpitations.       If  eats too much salt only--legs swell  Gastrointestinal: Negative for abdominal pain, blood in stool, constipation and melena.  Genitourinary: Negative for dysuria.  Musculoskeletal: Positive for back pain and joint pain. Negative for falls.  Skin: Negative for itching and rash.  Neurological: Positive for tingling and sensory change. Negative for weakness.       Bilateral lower legs and left buttock, down thigh into knee  Endo/Heme/Allergies: Bruises/bleeds easily.  Psychiatric/Behavioral: Negative for depression and memory loss. The patient is not nervous/anxious and does not have insomnia.     Health Maintenance  Topic Date Due  . INFLUENZA VACCINE  10/26/2016  . MAMMOGRAM  06/27/2017  . TETANUS/TDAP  09/05/2018  . DEXA SCAN  Completed  . PNA vac Low Risk Adult  Completed    Physical Exam: Vitals:   11/14/16 1307  BP: 120/78  Pulse: 65  Temp: 97.9 F (36.6 C)  TempSrc: Oral  SpO2: 98%  Weight: 172 lb (78 kg)   Body mass index is 32.5 kg/m. Physical Exam  Constitutional: She is oriented to person, place, and time. She appears well-developed and well-nourished. No distress.  Cardiovascular: Normal rate, regular rhythm, normal heart sounds and intact distal pulses.   Pulmonary/Chest: Effort normal and breath sounds normal. No respiratory distress.  Abdominal: Soft. Bowel sounds are normal.  Musculoskeletal: Normal range of motion.  Tender more over right SI into buttock, also tender bilateral lower legs below knees and into feet  Neurological: She is alert and oriented to person, place, and time.  Skin: Skin is warm and dry.  Psychiatric: She has a normal mood and affect.    Labs reviewed: Basic Metabolic Panel:  Recent Labs  11/16/15 0916 05/20/16 0825  NA 140 141  K 4.7 4.3  CL 106 104  CO2 26 23  GLUCOSE 90 92  BUN 23 28*  CREATININE 1.13* 1.13*  CALCIUM 9.9 9.9   Liver Function Tests:  Recent Labs  11/16/15 0916 05/20/16 0825  AST 33 31  ALT 20 20   ALKPHOS 43 62  BILITOT 0.4 0.4  PROT 6.9 6.9  ALBUMIN 4.3 4.0   No results for input(s): LIPASE, AMYLASE in the last 8760 hours. No results for input(s): AMMONIA in the last 8760 hours. CBC:  Recent Labs  11/16/15 0916 05/20/16 0825  WBC 5.0 6.8  NEUTROABS 2,750 4,148  HGB 11.2* 11.1*  HCT 34.7* 33.8*  MCV 92.0 90.1  PLT 273  309   Lipid Panel:  Recent Labs  11/16/15 0916 05/20/16 0825  CHOL 173 184  HDL 65 74  LDLCALC 86 95  TRIG 108 74  CHOLHDL 2.7 2.5   Lab Results  Component Value Date   HGBA1C 5.6 03/04/2013    Procedures since last visit: MRI lumbar spine January of this year:  Large L5-S1 disc extrusion displaces the traversing RIGHT L5 nerve and encroaches upon the traversing RIGHT S1 nerve. L3-4 PLIF and posterior decompression. 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 Multilevel neural foraminal narrowing: Severe on the RIGHT at L4-5, moderate to severe on the LEFT at L5-S1.  Assessment/Plan 1. B12 deficiency due to diet -has had historically, but last check was normal, reassess as that was several years ago and her eating habits remain poor - Vitamin B12 - Folate  2. Other polyneuropathy -suspect this will be a chronic condition but f/u vitamins as rule outs - Vitamin B12 - Folate  3. Spinal stenosis of lumbar region with neurogenic claudication -neurosurgeon did not think left buttock pain into thigh was from her back despite MRI findings -is for myelogram which hopefully will be more informative -discussed risks of surgery primarily due to her osteoporosis and fact she's had prior back surgery -she is otherwise in good health and remains very active, but the left leg wanting to give way and not support her certainly is limiting her along  with her neuropathy -may also benefit from nerve conduction  study of lower extremities (would favor her having that before moving to any surgery) -we also discussed epidurals   4. Senile osteoporosis -ongoing, severe in wrist last bone density -is on fosamax weekly -did not tolerate prolia with chest pain ensuing -cont ca with D and additional D therapy  Labs/tests ordered:   Orders Placed This Encounter  Procedures  . Vitamin B12  . Folate   Next appt:  3 mos med mgt and flu shot   Wh[place]any Parish L. Karagan Lehr, D.O. [facility] [address], [place] [address] Cell Phone (Mon-Fri 8am-5pm):  [phone] On Call:  [phone] & follow prompts after 5pm & weekends Office Phone:  [phone] Office Fax:  [phone]

## 2016-11-15 ENCOUNTER — Encounter: Payer: Self-pay | Admitting: Internal Medicine

## 2016-11-15 LAB — VITAMIN B12: Vitamin B-12: 390 pg/mL (ref 200–1100)

## 2016-11-15 LAB — FOLATE: Folate: >24 ng/mL (ref >5.4)

## 2016-11-16 DIAGNOSIS — H35371 Puckering of macula, right eye: Secondary | ICD-10-CM | POA: Diagnosis not present

## 2016-11-16 DIAGNOSIS — H35379 Puckering of macula, unspecified eye: Secondary | ICD-10-CM | POA: Diagnosis not present

## 2016-11-17 ENCOUNTER — Ambulatory Visit (INDEPENDENT_AMBULATORY_CARE_PROVIDER_SITE_OTHER): Payer: Medicare Other

## 2016-11-17 DIAGNOSIS — E538 Deficiency of other specified B group vitamins: Secondary | ICD-10-CM

## 2016-11-17 MED ORDER — CYANOCOBALAMIN 1000 MCG/ML IJ SOLN
1000.0000 ug | Freq: Once | INTRAMUSCULAR | Status: AC
  Administered 2016-11-17: 1000 ug via INTRAMUSCULAR

## 2016-12-12 ENCOUNTER — Encounter: Payer: Self-pay | Admitting: Internal Medicine

## 2016-12-14 MED ORDER — GABAPENTIN 100 MG PO CAPS: ORAL_CAPSULE | ORAL | 1 refills | Status: DC

## 2016-12-22 ENCOUNTER — Encounter: Payer: Self-pay | Admitting: Internal Medicine

## 2017-01-10 ENCOUNTER — Telehealth (INDEPENDENT_AMBULATORY_CARE_PROVIDER_SITE_OTHER): Payer: Self-pay | Admitting: Physical Medicine and Rehabilitation

## 2017-01-10 NOTE — Telephone Encounter (Signed)
Scheduled for 11/13 at 1300.

## 2017-01-10 NOTE — Telephone Encounter (Signed)
Yes ok 

## 2017-01-19 ENCOUNTER — Encounter: Payer: Self-pay | Admitting: Internal Medicine

## 2017-01-20 ENCOUNTER — Other Ambulatory Visit: Payer: Self-pay | Admitting: *Deleted

## 2017-01-20 MED ORDER — CELECOXIB 100 MG PO CAPS: 100.0000 mg | ORAL_CAPSULE | Freq: Two times a day (BID) | ORAL | 4 refills | Status: DC

## 2017-01-20 MED ORDER — GABAPENTIN 100 MG PO CAPS: ORAL_CAPSULE | ORAL | 4 refills | Status: DC

## 2017-01-20 NOTE — Telephone Encounter (Signed)
  Jody [name], Jody [name] Female, 76 y.o., 01/04/1941 Weight:  172 lb (78 kg) Phone:  [phone] Jody [name]) PCP:  Jody Curry, DO [place] Designated [facility] Release  MRN:  [id number] MyChart:  Active Next Appt:  01/24/2017    Message  Received: Today  Message Contents  Jody Curry, DO  May, Jody [name], CMA        Jody [name],  Please address the mychart message from Ms. Hakes--I've responded to her, but she has some requests that need processing--I will sign the printed celebrex and handicapped sticker form.   Thanks,  Tiffany L. Reed, D.O.  [facility]  [address], [place] [address]  Cell Phone (Mon-Fri 8am-5pm): [phone]  On Call: [phone] & follow prompts after 5pm & weekends  Office Phone: [phone]  Office Fax: [phone]          Gabapentin has been faxed to [facility], Celebrex Rx printed and placed in folder for signature and Handicap Placard filled out and placed in folder for review and signature. Dr. Mariea Clonts aware.

## 2017-01-24 ENCOUNTER — Ambulatory Visit (INDEPENDENT_AMBULATORY_CARE_PROVIDER_SITE_OTHER): Payer: Medicare Other

## 2017-01-24 DIAGNOSIS — E538 Deficiency of other specified B group vitamins: Secondary | ICD-10-CM | POA: Diagnosis not present

## 2017-01-24 DIAGNOSIS — Z23 Encounter for immunization: Secondary | ICD-10-CM | POA: Diagnosis not present

## 2017-01-24 MED ORDER — CYANOCOBALAMIN 1000 MCG/ML IJ SOLN
1000.0000 ug | Freq: Once | INTRAMUSCULAR | Status: AC
  Administered 2017-01-24: 1000 ug via INTRAMUSCULAR

## 2017-01-30 DIAGNOSIS — D0439 Carcinoma in situ of skin of other parts of face: Secondary | ICD-10-CM | POA: Diagnosis not present

## 2017-01-30 DIAGNOSIS — L570 Actinic keratosis: Secondary | ICD-10-CM | POA: Diagnosis not present

## 2017-01-30 DIAGNOSIS — C4492 Squamous cell carcinoma of skin, unspecified: Secondary | ICD-10-CM

## 2017-01-30 DIAGNOSIS — Z85820 Personal history of malignant melanoma of skin: Secondary | ICD-10-CM | POA: Diagnosis not present

## 2017-01-30 DIAGNOSIS — D229 Melanocytic nevi, unspecified: Secondary | ICD-10-CM | POA: Diagnosis not present

## 2017-01-30 HISTORY — DX: Squamous cell carcinoma of skin, unspecified: C44.92

## 2017-02-07 ENCOUNTER — Encounter (INDEPENDENT_AMBULATORY_CARE_PROVIDER_SITE_OTHER): Payer: Self-pay | Admitting: Physical Medicine and Rehabilitation

## 2017-02-07 ENCOUNTER — Ambulatory Visit (INDEPENDENT_AMBULATORY_CARE_PROVIDER_SITE_OTHER): Payer: Medicare Other | Admitting: Physical Medicine and Rehabilitation

## 2017-02-07 ENCOUNTER — Encounter (INDEPENDENT_AMBULATORY_CARE_PROVIDER_SITE_OTHER): Payer: Medicare Other | Admitting: Physical Medicine and Rehabilitation

## 2017-02-07 ENCOUNTER — Ambulatory Visit (INDEPENDENT_AMBULATORY_CARE_PROVIDER_SITE_OTHER): Payer: Medicare Other

## 2017-02-07 DIAGNOSIS — M5416 Radiculopathy, lumbar region: Secondary | ICD-10-CM

## 2017-02-07 DIAGNOSIS — M48062 Spinal stenosis, lumbar region with neurogenic claudication: Secondary | ICD-10-CM

## 2017-02-07 DIAGNOSIS — M961 Postlaminectomy syndrome, not elsewhere classified: Secondary | ICD-10-CM | POA: Diagnosis not present

## 2017-02-07 MED ORDER — BETAMETHASONE SOD PHOS & ACET 6 (3-3) MG/ML IJ SUSP: 12.0000 mg | Freq: Once | INTRAMUSCULAR | Status: DC

## 2017-02-07 MED ORDER — LIDOCAINE HCL (PF) 1 % IJ SOLN: 2.0000 mL | Freq: Once | INTRAMUSCULAR | Status: DC

## 2017-02-07 NOTE — Progress Notes (Signed)
Jody [name] is a 76 year old female L3-4 lumbar fusion and adjacent level disease with moderate canal stenosis at L4-5 as well as large disc extrusion at L5-S1.  Completed interlaminar injection in February with a lot of relief of her left leg pain.  She is now having more right leg symptoms which actually fits with her MRI from January.  She came in today for planned epidural injection.  Unfortunately she did not have a driver which we do require for safety purposes.  She reports that she did not have a driver last time even though we did document that she had one.  We will reschedule her for the injection.  I do think repeat injection is worthwhile.  Brief exam shows the patient ambulates with a cane.  She has no foot drop.  She has had no recent trauma.

## 2017-02-07 NOTE — Progress Notes (Deleted)
Jody [name] is a 76 year old female who originally was seen in our office by Dr. Derry Lory when he was here practicing. She has been under the care of [name] over the years for physical therapy and continues working with her recently. She reports several months of worsening lower back pain that changes from left to right side. Equally painful on both sides. Pain and tingling down left leg to foot.  States sometimes foot feels like something is on it such as a sock when she is not wearing one. Not sure if coming from knee or back. Wearing brace for knee for several months due to "broken knee cap." History of L3-4 instrumented fusion by Dr. Cyndy Freeze. She reports that the back surgery didn't help very much. She's been followed by Dr. Sharol Given and Dondra Prader. 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

## 2017-02-10 ENCOUNTER — Ambulatory Visit (INDEPENDENT_AMBULATORY_CARE_PROVIDER_SITE_OTHER): Payer: Medicare Other | Admitting: Physical Medicine and Rehabilitation

## 2017-02-10 ENCOUNTER — Ambulatory Visit (INDEPENDENT_AMBULATORY_CARE_PROVIDER_SITE_OTHER): Payer: Medicare Other

## 2017-02-10 DIAGNOSIS — M5416 Radiculopathy, lumbar region: Secondary | ICD-10-CM | POA: Diagnosis not present

## 2017-02-10 MED ORDER — LIDOCAINE HCL (PF) 1 % IJ SOLN
2.0000 mL | Freq: Once | INTRAMUSCULAR | Status: AC
  Administered 2017-02-10: 2 mL

## 2017-02-10 MED ORDER — BETAMETHASONE SOD PHOS & ACET 6 (3-3) MG/ML IJ SUSP
12.0000 mg | Freq: Once | INTRAMUSCULAR | Status: AC
Start: 2017-02-10 — End: 2017-02-10
  Administered 2017-02-10: 12 mg

## 2017-02-10 NOTE — Progress Notes (Deleted)
Jody [name] is a 76 year old female L3-4 lumbar fusion and adjacent level disease with moderate canal stenosis at L4-5 as well as large disc extrusion at L5-S1.  Completed interlaminar injection in February with a lot of relief of her left leg pain.  She is now having more right leg symptoms which actually fits with her MRI from January.    She reports several weeks of right-sided radicular pain that radiates to the ankle and more of an L5 distribution back pain across to the left but not nearly as bad as the right.  Her case is complicated by history of neuropathy and she does take gabapentin.  A lot of sensitivity to even the touch along the leg.  The injection  will be diagnostic and hopefully therapeutic. The patient has failed conservative care including time, medications and activity modification.

## 2017-02-10 NOTE — Patient Instructions (Signed)

## 2017-02-12 ENCOUNTER — Encounter (INDEPENDENT_AMBULATORY_CARE_PROVIDER_SITE_OTHER): Payer: Self-pay | Admitting: Physical Medicine and Rehabilitation

## 2017-02-13 ENCOUNTER — Ambulatory Visit (INDEPENDENT_AMBULATORY_CARE_PROVIDER_SITE_OTHER): Payer: Medicare Other | Admitting: Internal Medicine

## 2017-02-13 ENCOUNTER — Encounter: Payer: Self-pay | Admitting: Internal Medicine

## 2017-02-13 VITALS — BP 132/80 | HR 73 | Temp 98.1°F | Wt 175.0 lb

## 2017-02-13 DIAGNOSIS — M159 Polyosteoarthritis, unspecified: Secondary | ICD-10-CM

## 2017-02-13 DIAGNOSIS — M150 Primary generalized (osteo)arthritis: Secondary | ICD-10-CM

## 2017-02-13 DIAGNOSIS — M5416 Radiculopathy, lumbar region: Secondary | ICD-10-CM

## 2017-02-13 DIAGNOSIS — E538 Deficiency of other specified B group vitamins: Secondary | ICD-10-CM | POA: Diagnosis not present

## 2017-02-13 DIAGNOSIS — I10 Essential (primary) hypertension: Secondary | ICD-10-CM | POA: Diagnosis not present

## 2017-02-13 DIAGNOSIS — M8949 Other hypertrophic osteoarthropathy, multiple sites: Secondary | ICD-10-CM

## 2017-02-13 MED ORDER — ZOSTER VAC RECOMB ADJUVANTED 50 MCG/0.5ML IM SUSR: 0.5000 mL | Freq: Once | INTRAMUSCULAR | 1 refills | Status: DC

## 2017-02-13 MED ORDER — ZOSTER VAC RECOMB ADJUVANTED 50 MCG/0.5ML IM SUSR: 0.5000 mL | Freq: Once | INTRAMUSCULAR | 1 refills | Status: AC

## 2017-02-13 NOTE — Progress Notes (Signed)
Location:  [facility] clinic Provider:  Marylouise Mallet L. Mariea Clonts, D.O., C.M.D.  Code Status: DNR Goals of Care:  Advanced Directives 02/13/2017  Does Patient Have a Medical Advance Directive? Yes  Type of Advance Directive [place]  Does patient want to make changes to medical advance directive? No - Patient declined  Copy of [place] in Chart? Yes   Chief Complaint  Patient presents with  . Medical Management of Chronic Issues    follow-up to discuss pain    HPI: Patient is a 76 y.o. female seen today for medical management of chronic diseases and to discuss her pain control. She scheduled this b/c she was hurting so bad when at the beach. Her whole right side was hurting.  Gabapentin was not helping the right leg.  She got a steroid injection Friday.  Sat at the skating rink, she was able to walk 15 laps around the skating rink.  First time in 12 mos. Did rest after 5 laps each time.  Was taking advil when she didn't get pain control.  Takes her gabapentin and celebrex regularly.  Discussed adding tylenol if needed instead of advil due to GI, cardiac and renal risks of combining the advil with celebrex.    Shingles shot requested--sent to pharmacy today.    With Dr. Christella Noa, when he looked at her MRI, he wanted to do a myelogram about disc issues on the right side.  Asks about risks of the test.  She would not pursue a surgery except as a last ditch effort.   She is going to put this off at this point b/c she's better after the shot she received Friday.  She woke up Saturday and thought "[employer], there's no pain!".  Has been taking the gabapentin pretty regularly.  Yesterday, she took 200 in the morning and didn't take any at lunch and it was 4pm and she was doing fine.    BP had been fine with lisinopril over 140/90.  BP had been much higher to control over the past month morning and night--had been in 150 range.  Discussed pain and advil affecting bp.  Had  recorded them.  She did have her lisinopril this am b/c 155/78.  Discussed steroid effect now and weight gain. Also level of activity.     Gets B12 injection here.  Cannot take supplement b/c she does not absorb B12.    Past Medical History:  Diagnosis Date  . Anemia, unspecified   . B12 deficiency due to diet   . Cramp of limb   . Edema of both legs   . Encounter for long-term (current) use of other medications   . Lumbago   . Obesity   . Osteoarthrosis, unspecified whether generalized or localized, unspecified site   . Restless legs syndrome (RLS)   . Senile osteoporosis     Past Surgical History:  Procedure Laterality Date  . CATARACT EXTRACTION W/ INTRAOCULAR LENS  IMPLANT, BILATERAL  04/2014  . [place]  2005  . STOMACH SURGERY  1981   stapleing     Allergies  Allergen Reactions  . Prolia [Denosumab] Other (See Comments)    Chest pain  . Percocet [Oxycodone-Acetaminophen]     Confusion   . Shrimp [Shellfish Allergy]     All seafood    Outpatient Encounter Medications as of 02/13/2017  Medication Sig  . alendronate (FOSAMAX) 70 MG tablet Take 1 tablet (70 mg total) by mouth every 7 (seven)  days. Take with a full glass of water on an empty stomach.  . Calcium Carb-Cholecalciferol (CALCIUM + D3) 600-200 MG-UNIT TABS Take one tablet three times daily  . celecoxib (CELEBREX) 100 MG capsule Take 1 capsule (100 mg total) by mouth 2 (two) times daily.  . Cholecalciferol (VITAMIN D) 2000 UNITS tablet Take 2,000 Units by mouth daily.  [name] gabapentin (NEURONTIN) 100 MG capsule 200 mg in the morning; 200 mg  noon; 100 mg around 3pm; 100 mg around 6 p.m. and 100 mg at bedtime.  . Iron 66 MG TABS Take one tablet once daily  . lisinopril (PRINIVIL,ZESTRIL) 5 MG tablet Take 1 tablet by mouth if your blood pressure is 140/90  . Multiple Vitamin (MULTIVITAMIN) tablet Take 1 tablet by mouth daily.  [name] Zoster Vaccine Adjuvanted [facility]) injection Inject 0.5 mLs once for 1 dose into  the muscle.  . [DISCONTINUED] Zoster Vaccine Adjuvanted [facility]) injection Inject 0.5 mLs once into the muscle.  . [DISCONTINUED] Zoster Vaccine Adjuvanted [facility]) injection Inject 0.5 mLs once for 1 dose into the muscle.   No facility-administered encounter medications on file as of 02/13/2017.     Review of Systems:  Review of Systems  Constitutional: Negative for chills, fever and malaise/fatigue.  HENT: Negative for congestion.   Eyes: Negative for blurred vision.  Respiratory: Negative for cough and shortness of breath.   Cardiovascular: Negative for chest pain, palpitations and leg swelling.  Gastrointestinal: Negative for abdominal pain.  Genitourinary: Negative for dysuria.  Musculoskeletal: Positive for back pain. Negative for falls.       Using cane  Skin: Negative for itching and rash.  Neurological: Positive for tingling and sensory change. Negative for weakness.       Much improved after injection, has even missed some gabapentin w/o symptoms  Psychiatric/Behavioral: Negative for memory loss.    Health Maintenance  Topic Date Due  . MAMMOGRAM  06/27/2017  . TETANUS/TDAP  09/05/2018  . INFLUENZA VACCINE  Completed  . DEXA SCAN  Completed  . PNA vac Low Risk Adult  Completed    Physical Exam: Vitals:   02/13/17 0857  BP: 132/80  Pulse: 73  Temp: 98.1 F (36.7 C)  TempSrc: Oral  SpO2: 98%  Weight: 175 lb (79.4 kg)   Body mass index is 33.07 kg/m. Physical Exam  Constitutional: She is oriented to person, place, and time. She appears well-developed and well-nourished. No distress.  Cardiovascular: Normal rate, regular rhythm, normal heart sounds and intact distal pulses.  Pulmonary/Chest: Effort normal and breath sounds normal. No respiratory distress.  Abdominal: Bowel sounds are normal.  Musculoskeletal: Normal range of motion.  Using cane, but ambulating much faster and more steadily after her injection  Neurological: She is alert and oriented to  person, place, and time.  Skin: Skin is warm and dry. Capillary refill takes less than 2 seconds. There is pallor.  Flushed cheeks today  Psychiatric: She has a normal mood and affect.    Labs reviewed: Basic Metabolic Panel: Recent Labs    05/20/16 0825  NA 141  K 4.3  CL 104  CO2 23  GLUCOSE 92  BUN 28*  CREATININE 1.13*  CALCIUM 9.9   Liver Function Tests: Recent Labs    05/20/16 0825  AST 31  ALT 20  ALKPHOS 62  BILITOT 0.4  PROT 6.9  ALBUMIN 4.0   No results for input(s): LIPASE, AMYLASE in the last 8760 hours. No results for input(s): AMMONIA in the last 8760 hours. CBC:  Recent Labs    05/20/16 0825  WBC 6.8  NEUTROABS 4,148  HGB 11.1*  HCT 33.8*  MCV 90.1  PLT 309   Lipid Panel: Recent Labs    05/20/16 0825  CHOL 184  HDL 74  LDLCALC 95  TRIG 74  CHOLHDL 2.5   Lab Results  Component Value Date   HGBA1C 5.6 03/04/2013    Procedures since last visit: Xr C-arm No Report  Result Date: 02/10/2017 Please see Notes or Procedures tab for imaging impression.   Assessment/Plan 1. Radiculopathy, lumbar region -may try to take gradually less gabapentin as tolerated since injection has been helpful -is putting off myelogram due to much improvement in her symptoms with the steroid injection  2. Primary osteoarthritis involving multiple joints -continues on celebrex chronically--aware of risks but wants QOL benefit of pain relief  3. Essential hypertension, benign -bp at goal, cont lisinopril therapy  4. B12 deficiency due to diet -cont B12 injections--due next month  Labs/tests ordered:  No orders of the defined types were placed in this encounter.  Next appt:  03/02/2017 B12 shot   Jody [name] L. Tylena Prisk, D.O. [facility] [address], [place] [address] Cell Phone (Mon-Fri 8am-5pm):  [phone] On Call:  [phone] & follow prompts after 5pm & weekends Office Phone:   [phone] Office Fax:  [phone]

## 2017-02-24 ENCOUNTER — Ambulatory Visit: Payer: Medicare Other

## 2017-02-28 NOTE — Procedures (Signed)
Jody [name] is a 76 year old female L3-4 lumbar fusion and adjacent level disease with moderate canal stenosis at L4-5 as well as large disc extrusion at L5-S1.  Completed interlaminar injection in February with a lot of relief of her left leg pain.  She is now having more right leg symptoms which actually fits with her MRI from January.    She reports several weeks of right-sided radicular pain that radiates to the ankle and more of an L5 distribution back pain across to the left but not nearly as bad as the right.  Her case is complicated by history of neuropathy and she does take gabapentin.  A lot of sensitivity to even the touch along the leg.  The injection  will be diagnostic and hopefully therapeutic. The patient has failed conservative care including time, medications and activity modification.  Lumbar Epidural Steroid Injection - Interlaminar Approach with Fluoroscopic Guidance  Patient: Jody [name]      Date of Birth: [birth year] MRN: [id number] PCP: Gayland Curry, DO      Visit Date: 02/10/2017   Universal Protocol:     Consent Given By: the patient  Position: PRONE  Additional Comments: Vital signs were monitored before and after the procedure. Patient was prepped and draped in the usual sterile fashion. The correct patient, procedure, and site was verified.   Injection Procedure Details:  Procedure Site One Meds Administered:  Meds ordered this encounter  Medications  . lidocaine (PF) (XYLOCAINE) 1 % injection 2 mL  . betamethasone acetate-betamethasone sodium phosphate (CELESTONE) injection 12 mg     Laterality: Right  Location/Site:  L5-S1  Needle size: 20 G  Needle type: Tuohy  Needle Placement: Paramedian epidural  Findings:  -Contrast Used: 1 mL iohexol 180 mg iodine/mL   -Comments: Excellent flow of contrast into the epidural space.  Procedure Details: Using a paramedian approach from the side mentioned above, the region overlying the inferior lamina  was localized under fluoroscopic visualization and the soft tissues overlying this structure were infiltrated with 4 ml. of 1% Lidocaine without Epinephrine. The Tuohy needle was inserted into the epidural space using a paramedian approach.   The epidural space was localized using loss of resistance along with lateral and bi-planar fluoroscopic views.  After negative aspirate for air, blood, and CSF, a 2 ml. volume of Isovue-250 was injected into the epidural space and the flow of contrast was observed. Radiographs were obtained for documentation purposes.    The injectate was administered into the level noted above.   Additional Comments:  The patient tolerated the procedure well Dressing: Band-Aid    Post-procedure details: Patient was observed during the procedure. Post-procedure instructions were reviewed.  Patient left the clinic in stable condition.

## 2017-03-02 ENCOUNTER — Encounter: Payer: Self-pay | Admitting: Podiatry

## 2017-03-02 ENCOUNTER — Ambulatory Visit (INDEPENDENT_AMBULATORY_CARE_PROVIDER_SITE_OTHER): Payer: Medicare Other | Admitting: Podiatry

## 2017-03-02 ENCOUNTER — Other Ambulatory Visit: Payer: Self-pay | Admitting: Podiatry

## 2017-03-02 ENCOUNTER — Ambulatory Visit (INDEPENDENT_AMBULATORY_CARE_PROVIDER_SITE_OTHER): Payer: Medicare Other

## 2017-03-02 ENCOUNTER — Ambulatory Visit (INDEPENDENT_AMBULATORY_CARE_PROVIDER_SITE_OTHER): Payer: Medicare Other | Admitting: *Deleted

## 2017-03-02 DIAGNOSIS — M779 Enthesopathy, unspecified: Secondary | ICD-10-CM

## 2017-03-02 DIAGNOSIS — M1000 Idiopathic gout, unspecified site: Secondary | ICD-10-CM | POA: Diagnosis not present

## 2017-03-02 DIAGNOSIS — E538 Deficiency of other specified B group vitamins: Secondary | ICD-10-CM

## 2017-03-02 DIAGNOSIS — M79672 Pain in left foot: Secondary | ICD-10-CM

## 2017-03-02 MED ORDER — TRIAMCINOLONE ACETONIDE 10 MG/ML IJ SUSP
10.0000 mg | Freq: Once | INTRAMUSCULAR | Status: AC
  Administered 2017-03-02: 10 mg

## 2017-03-02 MED ORDER — CYANOCOBALAMIN 1000 MCG/ML IJ SOLN
1000.0000 ug | Freq: Once | INTRAMUSCULAR | Status: AC
  Administered 2017-03-02: 1000 ug via INTRAMUSCULAR

## 2017-03-02 NOTE — Progress Notes (Signed)
Subjective:   Patient ID: Jody [name], female   DOB: 76 y.o.   MRN: [id number]   HPI Patient presents with pain in the first MPJ left and states she has had several attacks of this and she is getting ready to go out of town in the next several weeks.  States been very tender and make shoe gear difficult.   ROS      Objective:  Physical Exam  Neurovascular status intact with patient found to have inflammation around the first MPJ left with fluid on the medial side of the joint and pain with palpation.     Assessment:  Possibilities for inflammatory capsulitis versus gout or combination of     Plan:  Reviewed condition and x-rays in today I injected the first MPJ medial side 3 mg Kenalog 5 mg Xylocaine advised on physical therapy wider shoes soaks and if symptoms persist will be seen back.  I then discussed gout gave her instructions on gout diet to follow and if symptoms were to increase we may need to get x-rays and also blood work for this condition.  X-rays today were negative for signs of fracture and did indicate slight enlargement of the soft tissue

## 2017-03-02 NOTE — Patient Instructions (Signed)

## 2017-03-08 ENCOUNTER — Encounter (INDEPENDENT_AMBULATORY_CARE_PROVIDER_SITE_OTHER): Payer: Self-pay | Admitting: Physical Medicine and Rehabilitation

## 2017-03-08 ENCOUNTER — Telehealth (INDEPENDENT_AMBULATORY_CARE_PROVIDER_SITE_OTHER): Payer: Self-pay | Admitting: Physical Medicine and Rehabilitation

## 2017-03-08 ENCOUNTER — Ambulatory Visit (INDEPENDENT_AMBULATORY_CARE_PROVIDER_SITE_OTHER): Payer: Medicare Other

## 2017-03-08 ENCOUNTER — Ambulatory Visit (INDEPENDENT_AMBULATORY_CARE_PROVIDER_SITE_OTHER): Payer: Medicare Other | Admitting: Physical Medicine and Rehabilitation

## 2017-03-08 VITALS — BP 144/77 | HR 85 | Temp 98.3°F

## 2017-03-08 DIAGNOSIS — M5416 Radiculopathy, lumbar region: Secondary | ICD-10-CM

## 2017-03-08 DIAGNOSIS — M961 Postlaminectomy syndrome, not elsewhere classified: Secondary | ICD-10-CM

## 2017-03-08 DIAGNOSIS — M48062 Spinal stenosis, lumbar region with neurogenic claudication: Secondary | ICD-10-CM

## 2017-03-08 MED ORDER — METHYLPREDNISOLONE ACETATE 80 MG/ML IJ SUSP
80.0000 mg | Freq: Once | INTRAMUSCULAR | Status: AC
  Administered 2017-03-08: 80 mg

## 2017-03-08 MED ORDER — PREDNISONE 50 MG PO TABS
ORAL_TABLET | ORAL | 0 refills | Status: DC
Start: 2017-03-08 — End: 2017-06-12

## 2017-03-08 MED ORDER — LIDOCAINE HCL (PF) 1 % IJ SOLN
2.0000 mL | Freq: Once | INTRAMUSCULAR | Status: AC
  Administered 2017-03-08: 2 mL

## 2017-03-08 NOTE — Progress Notes (Deleted)
Injection in November worked very well, but now having severe right leg pain. No numbness or tingling.

## 2017-03-08 NOTE — Patient Instructions (Signed)

## 2017-03-08 NOTE — Telephone Encounter (Signed)
She has seen Sharol Given, maybe appointment with him and he could try prednisone, we could see if [facility] can fit her for L5-S1 interlam, we can put her on list for cancellations and try to "fit in"

## 2017-03-08 NOTE — Telephone Encounter (Signed)
Scheduled for this afternoon at 1300 with a driver.

## 2017-03-10 DIAGNOSIS — M4126 Other idiopathic scoliosis, lumbar region: Secondary | ICD-10-CM | POA: Diagnosis not present

## 2017-03-10 DIAGNOSIS — M545 Low back pain: Secondary | ICD-10-CM | POA: Diagnosis not present

## 2017-03-10 DIAGNOSIS — M5116 Intervertebral disc disorders with radiculopathy, lumbar region: Secondary | ICD-10-CM | POA: Diagnosis not present

## 2017-03-10 DIAGNOSIS — M4316 Spondylolisthesis, lumbar region: Secondary | ICD-10-CM | POA: Diagnosis not present

## 2017-03-10 DIAGNOSIS — M48062 Spinal stenosis, lumbar region with neurogenic claudication: Secondary | ICD-10-CM | POA: Diagnosis not present

## 2017-03-16 ENCOUNTER — Ambulatory Visit (INDEPENDENT_AMBULATORY_CARE_PROVIDER_SITE_OTHER): Payer: Medicare Other | Admitting: Internal Medicine

## 2017-03-16 ENCOUNTER — Encounter: Payer: Self-pay | Admitting: Internal Medicine

## 2017-03-16 VITALS — BP 120/70 | HR 79 | Temp 98.1°F | Wt 169.0 lb

## 2017-03-16 DIAGNOSIS — M159 Polyosteoarthritis, unspecified: Secondary | ICD-10-CM

## 2017-03-16 DIAGNOSIS — E538 Deficiency of other specified B group vitamins: Secondary | ICD-10-CM | POA: Diagnosis not present

## 2017-03-16 DIAGNOSIS — M5416 Radiculopathy, lumbar region: Secondary | ICD-10-CM

## 2017-03-16 DIAGNOSIS — M8949 Other hypertrophic osteoarthropathy, multiple sites: Secondary | ICD-10-CM

## 2017-03-16 DIAGNOSIS — M150 Primary generalized (osteo)arthritis: Secondary | ICD-10-CM

## 2017-03-16 NOTE — Procedures (Signed)
Jody [name] is a 76 year old female with prior L3-4 lumbar fusion.  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  She underwent epidural injection several months ago that seemed to work extremely well.  Results did not last very long.  Lying severe right radicular leg pain worse with standing and ambulating and sitting.  We are going to repeat the injection today.  Depending on results would look at a transforaminal approach.  She is scheduled to go on a trip I do think that given the fact that she got relief before she hopefully would be able to make this trip.  If the injection does not help I be happy to fill out paperwork for her to try to reclaim some money used for the trip if needed from a medical standpoint.  She has a pretty significant problem with the disc herniation.  I am also going to give her a prescription for prednisone that she can take with her on the trip and only use if needed.  Lumbar Epidural Steroid Injection - Interlaminar Approach with Fluoroscopic Guidance  Patient: Jody [name]      Date of Birth: 07/20/1940 MRN: [id number] PCP: Gayland Curry, DO      Visit Date: 03/08/2017   Universal Protocol:     Consent Given By: the patient  Position: PRONE  Additional Comments: Vital signs were monitored before and after the procedure. Patient was prepped and draped in the usual sterile fashion. The correct patient, procedure, and site was verified.   Injection Procedure Details:  Procedure Site One Meds Administered:  Meds ordered this encounter  Medications  . lidocaine (PF) (XYLOCAINE) 1 % injection 2 mL  . methylPREDNISolone acetate (DEPO-MEDROL) injection 80 mg  . predniSONE (DELTASONE) 50 MG tablet    Sig: Take 1 tablet daily with food for 5 days until finished    Dispense:  5 tablet    Refill:  0     Laterality: Right  Location/Site:  L5-S1  Needle size: 20  G  Needle type: Tuohy  Needle Placement: Paramedian epidural  Findings:   -Comments: Excellent flow of contrast into the epidural space.  Procedure Details: Using a paramedian approach from the side mentioned above, the region overlying the inferior lamina was localized under fluoroscopic visualization and the soft tissues overlying this structure were infiltrated with 4 ml. of 1% Lidocaine without Epinephrine. The Tuohy needle was inserted into the epidural space using a paramedian approach.   The epidural space was localized using loss of resistance along with lateral and bi-planar fluoroscopic views.  After negative aspirate for air, blood, and CSF, a 2 ml. volume of Isovue-250 was injected into the epidural space and the flow of contrast was observed. Radiographs were obtained for documentation purposes.    The injectate was administered into the level noted above.   Additional Comments:  The patient tolerated the procedure well No complications occurred Dressing: Band-Aid    Post-procedure details: Patient was observed during the procedure. Post-procedure instructions were reviewed.  Patient left the clinic in stable condition.  Pertinent Imaging: IMPRESSION: Large L5-S1 disc extrusion displaces the traversing RIGHT L5 nerve and encroaches upon the traversing RIGHT S1 nerve.  L3-4 PLIF and posterior decompression. 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  Multilevel neural  foraminal narrowing: Severe on the RIGHT at L4-5, moderate to severe on the LEFT at L5-S1.   Electronically Signed   By: Elon Alas M.D.   On: 04/12/2016 19:53

## 2017-03-16 NOTE — Progress Notes (Signed)
Location:  [facility] clinic Provider:  Yamen Castrogiovanni L. Mariea Clonts, D.O., C.M.D.  Code Status: DNR Goals of Care:  Advanced Directives 02/13/2017  Does Patient Have a Medical Advance Directive? Yes  Type of Advance Directive [employer]  Does patient want to make changes to medical advance directive? No - Patient declined  Copy of [place] in Chart? Yes   Chief Complaint  Patient presents with  . Medical Management of Chronic Issues    follow-up    HPI: Patient is a 76 y.o. female seen today for medical management of chronic diseases.    She could not get out of bed during the snow.  She wanted a form filled out to get her trip refunded.  PT had suggested another steroid shot b/c November's helped short term.  When she could not move, she called Dr. Ernestina Patches and his nurse called him to try to get her in.  She had to get there in a couple hrs.  She's also got a pred pack to take with her if needed.  Dr. Christella Noa had immediately spoke about surgery.  [facility] Feliberto [facility] was recommended by a friend.  She has called their office for an appt in the new year, but got seen in 2 days.  All of her stuff was there when she got there.  He said that if she needed to get a surgery, she might need 10 pins in there.  He thought her L5-S1 disc was the primary problem.  He'd like to replace her disc.  He said it won't fix it, but it may take her pain away.  He's very nice by her report.        Past Medical History:  Diagnosis Date  . Anemia, unspecified   . B12 deficiency due to diet   . Cramp of limb   . Edema of both legs   . Encounter for long-term (current) use of other medications   . Lumbago   . Obesity   . Osteoarthrosis, unspecified whether generalized or localized, unspecified site   . Restless legs syndrome (RLS)   . Senile osteoporosis     Past Surgical History:  Procedure Laterality Date  . CATARACT EXTRACTION W/ INTRAOCULAR LENS  IMPLANT,  BILATERAL  04/2014  . [place]  2005  . STOMACH SURGERY  1981   stapleing     Allergies  Allergen Reactions  . Prolia [Denosumab] Other (See Comments)    Chest pain  . Percocet [Oxycodone-Acetaminophen]     Confusion   . Shrimp [Shellfish Allergy]     All seafood    Outpatient Encounter Medications as of 03/16/2017  Medication Sig  . alendronate (FOSAMAX) 70 MG tablet Take 1 tablet (70 mg total) by mouth every 7 (seven) days. Take with a full glass of water on an empty stomach.  . Calcium Carb-Cholecalciferol (CALCIUM + D3) 600-200 MG-UNIT TABS Take one tablet three times daily  . celecoxib (CELEBREX) 100 MG capsule Take 1 capsule (100 mg total) by mouth 2 (two) times daily.  . Cholecalciferol (VITAMIN D) 2000 UNITS tablet Take 2,000 Units by mouth daily.  [name] gabapentin (NEURONTIN) 100 MG capsule 200 mg in the morning; 200 mg  noon; 100 mg around 3pm; 100 mg around 6 p.m. and 100 mg at bedtime.  . Iron 66 MG TABS Take one tablet once daily  . lisinopril (PRINIVIL,ZESTRIL) 5 MG tablet Take 1 tablet by mouth if your blood pressure is 140/90  .  Multiple Vitamin (MULTIVITAMIN) tablet Take 1 tablet by mouth daily.  . predniSONE (DELTASONE) 50 MG tablet Take 1 tablet daily with food for 5 days until finished   No facility-administered encounter medications on file as of 03/16/2017.     Review of Systems:  Review of Systems  Constitutional: Negative for chills, fever and malaise/fatigue.  HENT: Negative for congestion.   Eyes: Negative for blurred vision.  Respiratory: Negative for shortness of breath.   Cardiovascular: Negative for chest pain, palpitations and leg swelling.  Gastrointestinal: Negative for abdominal pain, blood in stool, constipation and melena.  Genitourinary: Negative for dysuria.  Musculoskeletal: Positive for back pain.  Skin: Negative for itching and rash.  Neurological: Positive for tingling and sensory change. Negative for weakness.    Endo/Heme/Allergies: Does not bruise/bleed easily.  Psychiatric/Behavioral: Negative for depression and memory loss. The patient is not nervous/anxious and does not have insomnia.     Health Maintenance  Topic Date Due  . MAMMOGRAM  06/27/2017  . TETANUS/TDAP  09/05/2018  . INFLUENZA VACCINE  Completed  . DEXA SCAN  Completed  . PNA vac Low Risk Adult  Completed    Physical Exam: Vitals:   03/16/17 1140  BP: 120/70  Pulse: 79  Temp: 98.1 F (36.7 C)  TempSrc: Oral  SpO2: 97%  Weight: 169 lb (76.7 kg)   Body mass index is 31.93 kg/m. Physical Exam  Constitutional: She is oriented to person, place, and time. She appears well-developed and well-nourished. No distress.  Cardiovascular: Normal rate, regular rhythm, normal heart sounds and intact distal pulses.  Pulmonary/Chest: Effort normal and breath sounds normal. No respiratory distress.  Abdominal: Soft. Bowel sounds are normal.  Musculoskeletal: Normal range of motion.  Neurological: She is alert and oriented to person, place, and time.  Skin: Skin is warm and dry. Capillary refill takes less than 2 seconds. There is pallor.  Psychiatric: She has a normal mood and affect.    Labs reviewed: Basic Metabolic Panel: Recent Labs    05/20/16 0825  NA 141  K 4.3  CL 104  CO2 23  GLUCOSE 92  BUN 28*  CREATININE 1.13*  CALCIUM 9.9   Liver Function Tests: Recent Labs    05/20/16 0825  AST 31  ALT 20  ALKPHOS 62  BILITOT 0.4  PROT 6.9  ALBUMIN 4.0   No results for input(s): LIPASE, AMYLASE in the last 8760 hours. No results for input(s): AMMONIA in the last 8760 hours. CBC: Recent Labs    05/20/16 0825  WBC 6.8  NEUTROABS 4,148  HGB 11.1*  HCT 33.8*  MCV 90.1  PLT 309   Lipid Panel: Recent Labs    05/20/16 0825  CHOL 184  HDL 74  LDLCALC 95  TRIG 74  CHOLHDL 2.5   Lab Results  Component Value Date   HGBA1C 5.6 03/04/2013    Procedures since last visit: Dg Foot 2 Views Left  Result  Date: 03/02/2017 Please see detailed radiograph report in office note.  Xr C-arm No Report  Result Date: 03/08/2017 Please see Notes or Procedures tab for imaging impression.   Assessment/Plan 1. 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 Sherlyn Lick after her trip for possible surgery -cont gabapentin -has prn prednisone taper if flares up while traveling, but much better now after injection  2. Primary osteoarthritis involving multiple joints -cont chronic celebrex 100mg  po bid--aware of risks, does take with food,   3. B12 deficiency due to  diet -cont B12 shots  -is not changing diet, also on iron supplement  Labs/tests ordered:  No orders of the defined types were placed in this encounter.   Next appt:  04/04/2017   Rebeccah Ivins L. Jens Siems, D.O. [facility] [address], [place] [address] Cell Phone (Mon-Fri 8am-5pm):  [phone] On Call:  [phone] & follow prompts after 5pm & weekends Office Phone:  [phone] Office Fax:  [phone]

## 2017-04-04 ENCOUNTER — Ambulatory Visit (INDEPENDENT_AMBULATORY_CARE_PROVIDER_SITE_OTHER): Payer: Medicare Other

## 2017-04-04 DIAGNOSIS — E538 Deficiency of other specified B group vitamins: Secondary | ICD-10-CM

## 2017-04-04 MED ORDER — ZOSTER VAC RECOMB ADJUVANTED 50 MCG/0.5ML IM SUSR: 0.5000 mL | Freq: Once | INTRAMUSCULAR | 1 refills | Status: AC

## 2017-04-04 MED ORDER — CYANOCOBALAMIN 1000 MCG/ML IJ SOLN
1000.0000 ug | Freq: Once | INTRAMUSCULAR | Status: AC
Start: 2017-04-04 — End: 2017-04-04
  Administered 2017-04-04: 1000 ug via INTRAMUSCULAR

## 2017-04-04 MED FILL — SHINGRIX 50 MCG SUS: 50 | 1 days supply | Qty: 1 | Fill #0

## 2017-04-06 DIAGNOSIS — H5203 Hypermetropia, bilateral: Secondary | ICD-10-CM | POA: Diagnosis not present

## 2017-04-06 DIAGNOSIS — H52223 Regular astigmatism, bilateral: Secondary | ICD-10-CM | POA: Diagnosis not present

## 2017-04-06 DIAGNOSIS — H3581 Retinal edema: Secondary | ICD-10-CM | POA: Diagnosis not present

## 2017-04-06 DIAGNOSIS — H524 Presbyopia: Secondary | ICD-10-CM | POA: Diagnosis not present

## 2017-04-06 DIAGNOSIS — H35371 Puckering of macula, right eye: Secondary | ICD-10-CM | POA: Diagnosis not present

## 2017-04-07 DIAGNOSIS — M4126 Other idiopathic scoliosis, lumbar region: Secondary | ICD-10-CM | POA: Diagnosis not present

## 2017-04-07 DIAGNOSIS — M5116 Intervertebral disc disorders with radiculopathy, lumbar region: Secondary | ICD-10-CM | POA: Diagnosis not present

## 2017-04-07 DIAGNOSIS — M48062 Spinal stenosis, lumbar region with neurogenic claudication: Secondary | ICD-10-CM | POA: Diagnosis not present

## 2017-04-07 DIAGNOSIS — M4316 Spondylolisthesis, lumbar region: Secondary | ICD-10-CM | POA: Diagnosis not present

## 2017-04-27 DIAGNOSIS — H43822 Vitreomacular adhesion, left eye: Secondary | ICD-10-CM | POA: Diagnosis not present

## 2017-04-27 DIAGNOSIS — H43813 Vitreous degeneration, bilateral: Secondary | ICD-10-CM | POA: Diagnosis not present

## 2017-04-27 DIAGNOSIS — H35371 Puckering of macula, right eye: Secondary | ICD-10-CM | POA: Diagnosis not present

## 2017-05-09 ENCOUNTER — Ambulatory Visit (INDEPENDENT_AMBULATORY_CARE_PROVIDER_SITE_OTHER): Payer: Medicare Other

## 2017-05-09 DIAGNOSIS — E538 Deficiency of other specified B group vitamins: Secondary | ICD-10-CM

## 2017-05-09 MED ORDER — CYANOCOBALAMIN 1000 MCG/ML IJ SOLN
1000.0000 ug | Freq: Once | INTRAMUSCULAR | Status: AC
  Administered 2017-05-09: 1000 ug via INTRAMUSCULAR

## 2017-05-16 DIAGNOSIS — H43821 Vitreomacular adhesion, right eye: Secondary | ICD-10-CM | POA: Diagnosis not present

## 2017-05-16 DIAGNOSIS — H35371 Puckering of macula, right eye: Secondary | ICD-10-CM | POA: Diagnosis not present

## 2017-05-17 DIAGNOSIS — H3561 Retinal hemorrhage, right eye: Secondary | ICD-10-CM | POA: Diagnosis not present

## 2017-05-17 DIAGNOSIS — H35371 Puckering of macula, right eye: Secondary | ICD-10-CM | POA: Diagnosis not present

## 2017-05-25 DIAGNOSIS — H35371 Puckering of macula, right eye: Secondary | ICD-10-CM | POA: Diagnosis not present

## 2017-06-01 ENCOUNTER — Telehealth: Payer: Self-pay | Admitting: Internal Medicine

## 2017-06-01 NOTE — Telephone Encounter (Signed)
06/01/17 Left msg asking pt to confirm AWV-S w/ nurse after injection. Last AWV 05/20/16. VDM (DD)

## 2017-06-05 MED FILL — SHINGRIX 50 MCG SUS: 50 | 1 days supply | Qty: 1 | Fill #1

## 2017-06-12 ENCOUNTER — Ambulatory Visit (INDEPENDENT_AMBULATORY_CARE_PROVIDER_SITE_OTHER): Payer: Medicare Other

## 2017-06-12 ENCOUNTER — Ambulatory Visit: Payer: Medicare Other

## 2017-06-12 VITALS — BP 130/72 | HR 74 | Temp 98.1°F | Ht 61.0 in | Wt 173.0 lb

## 2017-06-12 DIAGNOSIS — Z0000 Encounter for general adult medical examination without abnormal findings: Secondary | ICD-10-CM

## 2017-06-12 DIAGNOSIS — H35371 Puckering of macula, right eye: Secondary | ICD-10-CM | POA: Diagnosis not present

## 2017-06-12 DIAGNOSIS — E538 Deficiency of other specified B group vitamins: Secondary | ICD-10-CM | POA: Diagnosis not present

## 2017-06-12 MED ORDER — CYANOCOBALAMIN 1000 MCG/ML IJ SOLN
1000.0000 ug | Freq: Once | INTRAMUSCULAR | Status: AC
Start: 2017-06-12 — End: 2017-06-12
  Administered 2017-06-12: 1000 ug via INTRAMUSCULAR

## 2017-06-12 NOTE — Patient Instructions (Signed)
, Jody [name] , Thank you for taking time to come for your Medicare Wellness Visit. I appreciate your ongoing commitment to your health goals. Please review the following plan we discussed and let me know if I can assist you in the future.   Screening recommendations/referrals: Colonoscopy excluded, over age 79 Mammogram up to date, due 07/04/2017 Bone Density up to date, due 08/13/2018 Recommended yearly ophthalmology/optometry visit for glaucoma screening and checkup Recommended yearly dental visit for hygiene and checkup  Vaccinations: Influenza vaccine up to date, due 2019 fall season Pneumococcal vaccine up to date, completed Tdap vaccine up to date, due 09/05/2018 Shingles vaccine up to date, completed    Advanced directives: in chart  Conditions/risks identified: none  Next appointment: Jody Dense, RN 06/15/2018 @ 10:45am   Preventive Care 65 Years and Older, Female Preventive care refers to lifestyle choices and visits with your health care provider that can promote health and wellness. What does preventive care include?  A yearly physical exam. This is also called an annual well check.  Dental exams once or twice a year.  Routine eye exams. Ask your health care provider how often you should have your eyes checked.  Personal lifestyle choices, including:  Daily care of your teeth and gums.  Regular physical activity.  Eating a healthy diet.  Avoiding tobacco and drug use.  Limiting alcohol use.  Practicing safe sex.  Taking low-dose aspirin every day.  Taking vitamin and mineral supplements as recommended by your health care provider. What happens during an annual well check? The services and screenings done by your health care provider during your annual well check will depend on your age, overall health, lifestyle risk factors, and family history of disease. Counseling  Your health care provider may ask you questions about your:  Alcohol use.  Tobacco  use.  Drug use.  Emotional well-being.  Home and relationship well-being.  Sexual activity.  Eating habits.  History of falls.  Memory and ability to understand (cognition).  Work and work Statistician.  Reproductive health. Screening  You may have the following tests or measurements:  Height, weight, and BMI.  Blood pressure.  Lipid and cholesterol levels. These may be checked every 5 years, or more frequently if you are over 82 years old.  Skin check.  Lung cancer screening. You may have this screening every year starting at age 41 if you have a 30-pack-year history of smoking and currently smoke or have quit within the past 15 years.  Fecal occult blood test (FOBT) of the stool. You may have this test every year starting at age 64.  Flexible sigmoidoscopy or colonoscopy. You may have a sigmoidoscopy every 5 years or a colonoscopy every 10 years starting at age 72.  Hepatitis C blood test.  Hepatitis B blood test.  Sexually transmitted disease (STD) testing.  Diabetes screening. This is done by checking your blood sugar (glucose) after you have not eaten for a while (fasting). You may have this done every 1-3 years.  Bone density scan. This is done to screen for osteoporosis. You may have this done starting at age 35.  Mammogram. This may be done every 1-2 years. Talk to your health care provider about how often you should have regular mammograms. Talk with your health care provider about your test results, treatment options, and if necessary, the need for more tests. Vaccines  Your health care provider may recommend certain vaccines, such as:  Influenza vaccine. This is recommended every year.  Tetanus, diphtheria, and acellular pertussis (Tdap, Td) vaccine. You may need a Td booster every 10 years.  Zoster vaccine. You may need this after age 52.  Pneumococcal 13-valent conjugate (PCV13) vaccine. One dose is recommended after age 79.  Pneumococcal  polysaccharide (PPSV23) vaccine. One dose is recommended after age 84. Talk to your health care provider about which screenings and vaccines you need and how often you need them. This information is not intended to replace advice given to you by your health care provider. Make sure you discuss any questions you have with your health care provider. Document Released: 04/10/2015 Document Revised: 12/02/2015 Document Reviewed: 01/13/2015 Elsevier Interactive Patient Education  2017 [place] Prevention in the Home Falls can cause injuries. They can happen to people of all ages. There are many things you can do to make your home safe and to help prevent falls. What can I do on the outside of my home?  Regularly fix the edges of walkways and driveways and fix any cracks.  Remove anything that might make you trip as you walk through a door, such as a raised step or threshold.  Trim any bushes or trees on the path to your home.  Use bright outdoor lighting.  Clear any walking paths of anything that might make someone trip, such as rocks or tools.  Regularly check to see if handrails are loose or broken. Make sure that both sides of any steps have handrails.  Any raised decks and porches should have guardrails on the edges.  Have any leaves, snow, or ice cleared regularly.  Use sand or salt on walking paths during winter.  Clean up any spills in your garage right away. This includes oil or grease spills. What can I do in the bathroom?  Use night lights.  Install grab bars by the toilet and in the tub and shower. Do not use towel bars as grab bars.  Use non-skid mats or decals in the tub or shower.  If you need to sit down in the shower, use a plastic, non-slip stool.  Keep the floor dry. Clean up any water that spills on the floor as soon as it happens.  Remove soap buildup in the tub or shower regularly.  Attach bath mats securely with double-sided non-slip rug  tape.  Do not have throw rugs and other things on the floor that can make you trip. What can I do in the bedroom?  Use night lights.  Make sure that you have a light by your bed that is easy to reach.  Do not use any sheets or blankets that are too big for your bed. They should not hang down onto the floor.  Have a firm chair that has side arms. You can use this for support while you get dressed.  Do not have throw rugs and other things on the floor that can make you trip. What can I do in the kitchen?  Clean up any spills right away.  Avoid walking on wet floors.  Keep items that you use a lot in easy-to-reach places.  If you need to reach something above you, use a strong step stool that has a grab bar.  Keep electrical cords out of the way.  Do not use floor polish or wax that makes floors slippery. If you must use wax, use non-skid floor wax.  Do not have throw rugs and other things on the floor that can make you trip. What can I do  with my stairs?  Do not leave any items on the stairs.  Make sure that there are handrails on both sides of the stairs and use them. Fix handrails that are broken or loose. Make sure that handrails are as long as the stairways.  Check any carpeting to make sure that it is firmly attached to the stairs. Fix any carpet that is loose or worn.  Avoid having throw rugs at the top or bottom of the stairs. If you do have throw rugs, attach them to the floor with carpet tape.  Make sure that you have a light switch at the top of the stairs and the bottom of the stairs. If you do not have them, ask someone to add them for you. What else can I do to help prevent falls?  Wear shoes that:  Do not have high heels.  Have rubber bottoms.  Are comfortable and fit you well.  Are closed at the toe. Do not wear sandals.  If you use a stepladder:  Make sure that it is fully opened. Do not climb a closed stepladder.  Make sure that both sides of the  stepladder are locked into place.  Ask someone to hold it for you, if possible.  Clearly mark and make sure that you can see:  Any grab bars or handrails.  First and last steps.  Where the edge of each step is.  Use tools that help you move around (mobility aids) if they are needed. These include:  Canes.  Walkers.  Scooters.  Crutches.  Turn on the lights when you go into a dark area. Replace any light bulbs as soon as they burn out.  Set up your furniture so you have a clear path. Avoid moving your furniture around.  If any of your floors are uneven, fix them.  If there are any pets around you, be aware of where they are.  Review your medicines with your doctor. Some medicines can make you feel dizzy. This can increase your chance of falling. Ask your doctor what other things that you can do to help prevent falls. This information is not intended to replace advice given to you by your health care provider. Make sure you discuss any questions you have with your health care provider. Document Released: 01/08/2009 Document Revised: 08/20/2015 Document Reviewed: 04/18/2014 Elsevier Interactive Patient Education  2017 [employer].

## 2017-06-12 NOTE — Progress Notes (Signed)
Subjective:   Jody [name] is a 77 y.o. female who presents for Medicare Annual (Subsequent) preventive examination.  Last AWV-05/20/2016    Objective:     Vitals: BP 130/72 (BP Location: Right Arm, Patient Position: Sitting)   Pulse 74   Temp 98.1 F (36.7 C) (Oral)   Ht 5\' 1"  (1.549 m)   Wt 173 lb (78.5 kg)   SpO2 97%   BMI 32.69 kg/m   Body mass index is 32.69 kg/m.  Advanced Directives 06/12/2017 02/13/2017 11/14/2016 09/05/2016 07/04/2016 05/27/2016 05/20/2016  Does Patient Have a Medical Advance Directive? Yes Yes Yes Yes Yes Yes Yes  Type of [place] administrator Power of [place] of [place] of [place] of [place]  Does patient want to make changes to medical advance directive? No - Patient declined No - Patient declined - - - No - Patient declined -  Copy of [place] in Chart? Yes Yes Yes Yes - Yes Yes    Tobacco Social History   Tobacco Use  Smoking Status Former Smoker  . Last attempt to quit: 05/22/1975  . Years since quitting: 42.0  Smokeless Tobacco Never Used  Tobacco Comment   Quit in early 64's     Counseling given: Not Answered Comment: Quit in early 30's   Clinical Intake:  Pre-visit preparation completed: No  Pain : No/denies pain     Nutritional Risks: None Diabetes: No  How often do you need to have someone help you when you read instructions, pamphlets, or other written materials from your doctor or pharmacy?: 1 - Never What is the last grade level you completed in school?: Masters  Interpreter Needed?: No  Information entered by :: [name], RN  Past Medical History:  Diagnosis Date  . Anemia, unspecified   . B12 deficiency due to diet   . Cramp of limb   . Edema of both legs   . Encounter for long-term (current) use of other medications   . Lumbago   . Obesity     . Osteoarthrosis, unspecified whether generalized or localized, unspecified site   . Restless legs syndrome (RLS)   . Senile osteoporosis    Past Surgical History:  Procedure Laterality Date  . CATARACT EXTRACTION W/ INTRAOCULAR LENS  IMPLANT, BILATERAL  04/2014  . 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 [place]  2005  . STOMACH SURGERY  1981   stapleing    Family History  Problem Relation Age of Onset  . Cancer Sister   . Cancer Brother    Social History   Socioeconomic History  . Marital status: Married    Spouse name: None  . Number of children: None  . Years of education: None  . Highest education level: None  Social Needs  . Financial resource strain: Not hard at all  . Food insecurity - worry: Never true  . Food insecurity - inability: Never true  . Transportation needs - medical: No  . Transportation needs - non-medical: No  Occupational History  . Occupation: retired Administrator   Tobacco Use  . Smoking status: Former Smoker    Last attempt to quit: 05/22/1975    Years since quitting: 42.0  . Smokeless tobacco: Never Used  . Tobacco comment: Quit in early 30's  Substance and Sexual Activity  . Alcohol use: No    Alcohol/week: 0.0 oz  .  Drug use: No  . Sexual activity: No  Other Topics Concern  . None  Social History Narrative   Married -Elenore Rota married 1966   Former Smoker stopped 1977   Alcohol none   Exercise -Deep water 4 times a week for one hour   POA    Outpatient Encounter Medications as of 06/12/2017  Medication Sig  . acetaminophen (TYLENOL) 325 MG tablet Take 650 mg by mouth every 6 (six) hours as needed.  . Calcium Carb-Cholecalciferol (CALCIUM + D3) 600-200 MG-UNIT TABS Take one tablet three times daily  . celecoxib (CELEBREX) 100 MG capsule Take 1 capsule (100 mg total) by mouth 2 (two) times daily.  . Cholecalciferol (VITAMIN D) 2000 UNITS tablet Take 2,000 Units by mouth daily.  [name] gabapentin (NEURONTIN) 300 MG capsule  Take 300 mg by mouth 3 (three) times daily.  . Iron 66 MG TABS Take one tablet once daily  . lisinopril (PRINIVIL,ZESTRIL) 5 MG tablet Take 1 tablet by mouth if your blood pressure is 140/90  . Multiple Vitamin (MULTIVITAMIN) tablet Take 1 tablet by mouth daily.  . [DISCONTINUED] alendronate (FOSAMAX) 70 MG tablet Take 1 tablet (70 mg total) by mouth every 7 (seven) days. Take with a full glass of water on an empty stomach.  . [DISCONTINUED] gabapentin (NEURONTIN) 100 MG capsule 200 mg in the morning; 200 mg  noon; 100 mg around 3pm; 100 mg around 6 p.m. and 100 mg at bedtime.  . [DISCONTINUED] predniSONE (DELTASONE) 50 MG tablet Take 1 tablet daily with food for 5 days until finished  . [EXPIRED] cyanocobalamin ((VITAMIN B-12)) injection 1,000 mcg    No facility-administered encounter medications on file as of 06/12/2017.     Activities of Daily Living In your present state of health, do you have any difficulty performing the following activities: 06/12/2017  Hearing? N  Vision? Y  Difficulty concentrating or making decisions? Y  Walking or climbing stairs? Y  Dressing or bathing? N  Doing errands, shopping? N  Preparing Food and eating ? N  Using the Toilet? N  In the past six months, have you accidently leaked urine? N  Do you have problems with loss of bowel control? N  Managing your Medications? N  Managing your Finances? N  Housekeeping or managing your Housekeeping? N  Some recent data might be hidden    Patient Care Team: Gayland Curry, DO as PCP - General (Geriatric Medicine) Marica Otter, [place] as Consulting Physician (Optometry) Regal, Tamala Fothergill, DPM as Consulting Physician (Podiatry) Debara Pickett Nadean Corwin, MD as Consulting Physician (Cardiology)    Assessment:   This is a routine wellness examination for [facility].  Exercise Activities and Dietary recommendations Current Exercise Habits: Structured exercise class, Type of exercise: Other - see comments(water aerobics), Time  (Minutes): 60, Frequency (Times/Week): 4, Weekly Exercise (Minutes/Week): 240, Intensity: Mild, Exercise limited by: None identified  Goals    None      Fall Risk Fall Risk  06/12/2017 11/14/2016 09/05/2016 07/04/2016 05/27/2016  Falls in the past year? No No No Yes Yes  Number falls in past yr: - - - 1 2 or more  Comment - - - - -  Injury with Fall? - - - Yes Yes  Comment - - - - Broke Knee in November 2017   Risk for fall due to : - - - - -  Risk for fall due to: Comment - - - - -  Follow up - - - - -   Is  the patient's home free of loose throw rugs in walkways, pet beds, electrical cords, etc?   yes      Grab bars in the bathroom? yes      Handrails on the stairs?   yes      Adequate lighting?   yes  Depression Screen PHQ 2/9 Scores 06/12/2017 11/14/2016 09/05/2016 05/27/2016  PHQ - 2 Score 0 0 0 0     Cognitive Function MMSE - Mini Mental State Exam 06/12/2017 05/20/2016 05/22/2015  Orientation to time 5 5 5   Orientation to Place 5 5 5   Registration 3 3 3   Attention/ Calculation 5 5 5   Recall 2 3 3   Language- name 2 objects 2 2 2   Language- repeat 1 1 1   Language- follow 3 step command 3 3 3   Language- read & follow direction 1 1 1   Write a sentence 1 1 1   Copy design 1 1 1   Total score 29 30 30         Immunization History  Administered Date(s) Administered  . Hepatitis A 11/13/1997, 06/07/2005  . Hepatitis B 09/11/1997, 10/09/1997, 03/26/1998  . IPV 09/11/1997  . Influenza, High Dose Seasonal PF 01/24/2017  . Influenza,inj,Quad PF,6+ Mos 01/28/2013, 05/16/2014, 11/19/2015  . Influenza-Unspecified 04/09/1998  . Pneumococcal Conjugate-13 11/14/2014  . Pneumococcal Polysaccharide-23 03/28/2008  . Pneumococcal-Unspecified 03/28/2004  . Td 10/09/1997, 09/04/2008  . Typhoid Parenteral 11/13/1997, 06/07/2005  . Zoster 05/27/2007  . Zoster Recombinat (Shingrix) 04/04/2017, 06/06/2017    Qualifies for Shingles Vaccine? No, up to date  Screening Tests Health Maintenance    Topic Date Due  . MAMMOGRAM  06/27/2017  . TETANUS/TDAP  09/05/2018  . INFLUENZA VACCINE  Completed  . DEXA SCAN  Completed  . PNA vac Low Risk Adult  Completed    Cancer Screenings: Lung: Low Dose CT Chest recommended if Age 50-80 years, 30 pack-year currently smoking OR have quit w/in 15years. Patient does not qualify. Breast:  Up to date on Mammogram? Yes   Up to date of Bone Density/Dexa? Yes Colorectal: up to date  Additional Screenings: Hepatitis C Screening: declined     Plan:    I have personally reviewed and addressed the Medicare Annual Wellness questionnaire and have noted the following in the patient's chart:  A. Medical and social history B. Use of alcohol, tobacco or illicit drugs  C. Current medications and supplements D. Functional ability and status E.  Nutritional status F.  Physical activity G. Advance directives H. List of other physicians I.  Hospitalizations, surgeries, and ER visits in previous 12 months J.  [place] to include hearing, vision, cognitive, depression L. Referrals and appointments - none  In addition, I have reviewed and discussed with patient certain preventive protocols, quality metrics, and best practice recommendations. A written personalized care plan for preventive services as well as general preventive health recommendations were provided to patient.  See attached scanned questionnaire for additional information.   Signed,   [name], RN Nurse Health Advisor  Patient Concerns: She is having a hard time remembering to take Fosamax and would like a replacement

## 2017-07-03 DIAGNOSIS — C4492 Squamous cell carcinoma of skin, unspecified: Secondary | ICD-10-CM

## 2017-07-03 DIAGNOSIS — C44622 Squamous cell carcinoma of skin of right upper limb, including shoulder: Secondary | ICD-10-CM | POA: Diagnosis not present

## 2017-07-03 DIAGNOSIS — D485 Neoplasm of uncertain behavior of skin: Secondary | ICD-10-CM | POA: Diagnosis not present

## 2017-07-03 DIAGNOSIS — L570 Actinic keratosis: Secondary | ICD-10-CM | POA: Diagnosis not present

## 2017-07-03 DIAGNOSIS — D045 Carcinoma in situ of skin of trunk: Secondary | ICD-10-CM | POA: Diagnosis not present

## 2017-07-03 DIAGNOSIS — C44619 Basal cell carcinoma of skin of left upper limb, including shoulder: Secondary | ICD-10-CM | POA: Diagnosis not present

## 2017-07-03 HISTORY — DX: Squamous cell carcinoma of skin, unspecified: C44.92

## 2017-07-12 DIAGNOSIS — Z1231 Encounter for screening mammogram for malignant neoplasm of breast: Secondary | ICD-10-CM | POA: Diagnosis not present

## 2017-07-17 ENCOUNTER — Encounter: Payer: Self-pay | Admitting: Internal Medicine

## 2017-07-17 ENCOUNTER — Ambulatory Visit (INDEPENDENT_AMBULATORY_CARE_PROVIDER_SITE_OTHER): Payer: Medicare Other | Admitting: Internal Medicine

## 2017-07-17 VITALS — BP 120/70 | HR 75 | Temp 98.0°F | Ht 61.0 in | Wt 175.0 lb

## 2017-07-17 DIAGNOSIS — E669 Obesity, unspecified: Secondary | ICD-10-CM | POA: Insufficient documentation

## 2017-07-17 DIAGNOSIS — M5416 Radiculopathy, lumbar region: Secondary | ICD-10-CM | POA: Diagnosis not present

## 2017-07-17 DIAGNOSIS — E538 Deficiency of other specified B group vitamins: Secondary | ICD-10-CM | POA: Diagnosis not present

## 2017-07-17 DIAGNOSIS — M8949 Other hypertrophic osteoarthropathy, multiple sites: Secondary | ICD-10-CM

## 2017-07-17 DIAGNOSIS — E66811 Obesity, class 1: Secondary | ICD-10-CM | POA: Insufficient documentation

## 2017-07-17 DIAGNOSIS — M159 Polyosteoarthritis, unspecified: Secondary | ICD-10-CM

## 2017-07-17 DIAGNOSIS — M150 Primary generalized (osteo)arthritis: Secondary | ICD-10-CM | POA: Diagnosis not present

## 2017-07-17 DIAGNOSIS — I10 Essential (primary) hypertension: Secondary | ICD-10-CM | POA: Diagnosis not present

## 2017-07-17 DIAGNOSIS — M810 Age-related osteoporosis without current pathological fracture: Secondary | ICD-10-CM | POA: Diagnosis not present

## 2017-07-17 MED ORDER — CYANOCOBALAMIN 1000 MCG/ML IJ SOLN
1000.0000 ug | Freq: Once | INTRAMUSCULAR | Status: AC
  Administered 2017-07-17: 1000 ug via INTRAMUSCULAR

## 2017-07-17 NOTE — Patient Instructions (Addendum)
Try to avoid buying sweets to have in your house.  Try replacing the candy with fruit to get your sweets fix.    Increase your activity as your back pain, arthritis pain and nerve pain allow.

## 2017-07-17 NOTE — Progress Notes (Signed)
Location:  [facility] clinic Provider:  Paxtyn Wisdom L. Mariea Clonts, D.O., C.M.D.  Code Status: full code Goals of Care:  Advanced Directives 07/17/2017  Does Patient Have a Medical Advance Directive? Yes  Type of Advance Directive [place]  Does patient want to make changes to medical advance directive? No - Patient declined  Copy of [place] in Chart? Yes   Chief Complaint  Patient presents with  . Medical Management of Chronic Issues    44mth follow-up   . ACP    HCPOA    HPI: Patient is a 77 y.o. female seen today for medical management of chronic diseases.    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  Made a huge difference in her vision.    She is doing better overall. She is taking the gabapentin.  She is taking 300mg , 300mg  and 300mg  gabapentin (in 6 hr intervals).  Has not needed tylenol in between.  Often forgets the middle gabapentin.  Does still take tylenol pm at bedtime.    Shireen Quan is still not great.  She has been swimming 5 days most weeks, sometimes 4 days only depending on her schedule.  Walks well behind the grocery cart.  Uses her cane all the time.  Can't stand or walk a long time.    In December, there had been snow and couldn't get out of bed two days so had a word of prayer with herself and the extra prednisone.    She did get to the [place].  She managed to do things quite well.  The planner was helpful at having a lighter alternate for her to do.  T[place] the scooter in the airport.    She missed a bunch of her fosamax.  Stopped taking it altogether eventually.  Says she is going to need something different.  We discussed evenity monthly injection (anabolic).    Discusses that she'd like to eat fewer sweets, but doesn't want to do the work to do this.  Is aware of what needs to be done, but does not make the choices to do it.  Past Medical History:  Diagnosis Date  . Anemia, unspecified   .  B12 deficiency due to diet   . Cramp of limb   . Edema of both legs   . Encounter for long-term (current) use of other medications   . Lumbago   . Obesity   . Osteoarthrosis, unspecified whether generalized or localized, unspecified site   . Restless legs syndrome (RLS)   . Senile osteoporosis     Past Surgical History:  Procedure Laterality Date  . CATARACT EXTRACTION W/ INTRAOCULAR LENS  IMPLANT, BILATERAL  04/2014  . 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 [place]  2005  . STOMACH SURGERY  1981   stapleing     Allergies  Allergen Reactions  . Prolia [Denosumab] Other (See Comments)    Chest pain  . Percocet [Oxycodone-Acetaminophen]     Confusion   . Shrimp [Shellfish Allergy]     All seafood    Outpatient Encounter Medications as of 07/17/2017  Medication Sig  . acetaminophen (TYLENOL) 325 MG tablet Take 650 mg by mouth every 6 (six) hours as needed.  . Calcium Carb-Cholecalciferol (CALCIUM + D3) 600-200 MG-UNIT TABS Take one tablet three times daily  . celecoxib (CELEBREX) 100 MG capsule Take 1 capsule (100 mg total) by mouth 2 (two) times daily.  [name]  Cholecalciferol (VITAMIN D) 2000 UNITS tablet Take 2,000 Units by mouth daily.  [name] gabapentin (NEURONTIN) 300 MG capsule Take 300 mg by mouth 3 (three) times daily.  . Iron 66 MG TABS Take one tablet once daily  . lisinopril (PRINIVIL,ZESTRIL) 5 MG tablet Take 1 tablet by mouth if your blood pressure is 140/90  . Multiple Vitamin (MULTIVITAMIN) tablet Take 1 tablet by mouth daily.   No facility-administered encounter medications on file as of 07/17/2017.     Review of Systems:  Review of Systems  Constitutional: Negative for chills, fever and malaise/fatigue.       Obese  HENT: Negative for congestion.   Eyes: Negative for blurred vision.  Respiratory: Negative for shortness of breath.   Cardiovascular: Negative for chest pain, palpitations and leg swelling.  Gastrointestinal: Negative for abdominal pain.    Genitourinary: Negative for dysuria.  Musculoskeletal: Positive for back pain. Negative for falls and myalgias.  Skin: Negative for itching and rash.  Neurological: Positive for tingling and sensory change.  Psychiatric/Behavioral: Negative for depression and memory loss. The patient has insomnia. The patient is not nervous/anxious.        But sleeps with tylenol pm (takes against my recommendation)    Health Maintenance  Topic Date Due  . MAMMOGRAM  06/27/2017  . INFLUENZA VACCINE  10/26/2017  . TETANUS/TDAP  09/05/2018  . DEXA SCAN  Completed  . PNA vac Low Risk Adult  Completed    Physical Exam: Vitals:   07/17/17 1002  BP: 120/70  Pulse: 75  Temp: 98 F (36.7 C)  TempSrc: Oral  SpO2: 95%  Weight: 175 lb (79.4 kg)  Height: 5\' 1"  (1.549 m)   Body mass index is 33.07 kg/m. Physical Exam  Constitutional: She is oriented to person, place, and time. She appears well-developed and well-nourished. No distress.  HENT:  Head: Normocephalic and atraumatic.  Cardiovascular: Normal rate, regular rhythm, normal heart sounds and intact distal pulses.  Pulmonary/Chest: Effort normal and breath sounds normal. No respiratory distress.  Musculoskeletal: She exhibits no tenderness.  Walks with cane  Neurological: She is alert and oriented to person, place, and time.  Skin: Skin is warm and dry. Capillary refill takes less than 2 seconds. There is pallor.  Psychiatric: She has a normal mood and affect.    Labs reviewed:  Lab Results  Component Value Date   HGBA1C 5.6 03/04/2013   Assessment/Plan 1. Senile osteoporosis -will try to get new anabolic med covered for her, has h/o osteoporotic fracture, is having trouble remembering her fosamax, had chest pain with prolia and is not doing a daily injection of forteo -is interested in monthly injection as she gets b12 monthly anyway  2. 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 B12 deficiency due to diet - continue monthly injections, f/u level - Vitamin B12; Future  4. Primary osteoarthritis involving multiple joints -ongoing, takes celebrex which is the only thing that works for her--gets from [place]  5. Essential hypertension, benign -bp well controlled with current regimen, using more lisinopril - CBC with Differential/Platelet; Future - COMPLETE METABOLIC PANEL WITH GFR; Future  6. Obesity (BMI 30.0-34.9) - ongoing, she knows what she has to do but has not chosen to take the time to make those changes--she says this herself -counseling provided with some simple changes she could make that might help (in avs) - COMPLETE METABOLIC PANEL WITH GFR;  Future - Lipid panel; Future  Labs/tests ordered:   Orders Placed This Encounter  Procedures  . CBC with Differential/Platelet    Standing Status:   Future    Standing Expiration Date:   09/22/2017  . COMPLETE METABOLIC PANEL WITH GFR    Standing Status:   Future    Standing Expiration Date:   09/22/2017  . Lipid panel    Standing Status:   Future    Standing Expiration Date:   09/22/2017  . Vitamin B12    Standing Status:   Future    Standing Expiration Date:   09/22/2017   Next appt:   1 month for b12, fasting labs and possibly new osteoporosis injection   Marki Frede L. Sharlynn Seckinger, D.O. [facility] [address], [place] [address] Cell Phone (Mon-Fri 8am-5pm):  [phone] On Call:  [phone] & follow prompts after 5pm & weekends Office Phone:  [phone] Office Fax:  [phone]

## 2017-08-16 ENCOUNTER — Ambulatory Visit: Payer: Medicare Other

## 2017-08-16 ENCOUNTER — Other Ambulatory Visit: Payer: Medicare Other

## 2017-08-17 ENCOUNTER — Ambulatory Visit (INDEPENDENT_AMBULATORY_CARE_PROVIDER_SITE_OTHER): Payer: Medicare Other

## 2017-08-17 ENCOUNTER — Other Ambulatory Visit: Payer: Medicare Other

## 2017-08-17 DIAGNOSIS — I10 Essential (primary) hypertension: Secondary | ICD-10-CM

## 2017-08-17 DIAGNOSIS — E669 Obesity, unspecified: Secondary | ICD-10-CM | POA: Diagnosis not present

## 2017-08-17 DIAGNOSIS — E538 Deficiency of other specified B group vitamins: Secondary | ICD-10-CM

## 2017-08-17 MED ORDER — CYANOCOBALAMIN 1000 MCG/ML IJ SOLN
1000.0000 ug | Freq: Once | INTRAMUSCULAR | Status: AC
  Administered 2017-08-17: 1000 ug via INTRAMUSCULAR

## 2017-08-18 LAB — CBC WITH DIFFERENTIAL/PLATELET
Basophils Absolute: 22 cells/uL (ref 0–200)
Basophils Relative: 0.4 %
Eosinophils Absolute: 198 cells/uL (ref 15–500)
Eosinophils Relative: 3.6 %
HCT: 33.3 % — ABNORMAL LOW (ref 35.0–45.0)
Hemoglobin: 11.2 g/dL — ABNORMAL LOW (ref 11.7–15.5)
Lymphs Abs: 1678 cells/uL (ref 850–3900)
MCH: 30.2 pg (ref 27.0–33.0)
MCHC: 33.6 g/dL (ref 32.0–36.0)
MCV: 89.8 fL (ref 80.0–100.0)
MPV: 9.9 fL (ref 7.5–12.5)
Monocytes Relative: 9.4 %
Neutro Abs: 3086 cells/uL (ref 1500–7800)
Neutrophils Relative %: 56.1 %
Platelets: 277 10*3/uL (ref 140–400)
RBC: 3.71 10*6/uL — ABNORMAL LOW (ref 3.80–5.10)
RDW: 11.6 % (ref 11.0–15.0)
Total Lymphocyte: 30.5 %
WBC mixed population: 517 cells/uL (ref 200–950)
WBC: 5.5 10*3/uL (ref 3.8–10.8)

## 2017-08-18 LAB — COMPLETE METABOLIC PANEL WITH GFR
AG Ratio: 1.6 (calc) (ref 1.0–2.5)
ALT: 18 U/L (ref 6–29)
AST: 30 U/L (ref 10–35)
Albumin: 4.3 g/dL (ref 3.6–5.1)
Alkaline phosphatase (APISO): 61 U/L (ref 33–130)
BUN/Creatinine Ratio: 30 (calc) — ABNORMAL HIGH (ref 6–22)
BUN: 30 mg/dL — ABNORMAL HIGH (ref 7–25)
CO2: 27 mmol/L (ref 20–32)
Calcium: 9.8 mg/dL (ref 8.6–10.4)
Chloride: 107 mmol/L (ref 98–110)
Creat: 1 mg/dL — ABNORMAL HIGH (ref 0.60–0.93)
GFR, Est African American: 62 mL/min/{1.73_m2} (ref >=60)
GFR, Est Non African American: 54 mL/min/{1.73_m2} — ABNORMAL LOW (ref >=60)
Globulin: 2.7 g/dL (calc) (ref 1.9–3.7)
Glucose, Bld: 87 mg/dL (ref 65–99)
Potassium: 4.3 mmol/L (ref 3.5–5.3)
Sodium: 141 mmol/L (ref 135–146)
Total Bilirubin: 0.5 mg/dL (ref 0.2–1.2)
Total Protein: 7 g/dL (ref 6.1–8.1)

## 2017-08-18 LAB — LIPID PANEL
Cholesterol: 185 mg/dL (ref <200)
HDL: 64 mg/dL (ref >50)
LDL Cholesterol (Calc): 102 mg/dL (calc) — ABNORMAL HIGH
Non-HDL Cholesterol (Calc): 121 mg/dL (calc) (ref <130)
Total CHOL/HDL Ratio: 2.9 (calc) (ref <5.0)
Triglycerides: 95 mg/dL (ref <150)

## 2017-08-18 LAB — VITAMIN B12: Vitamin B-12: 671 pg/mL (ref 200–1100)

## 2017-08-24 ENCOUNTER — Encounter: Payer: Self-pay | Admitting: *Deleted

## 2017-08-24 DIAGNOSIS — D045 Carcinoma in situ of skin of trunk: Secondary | ICD-10-CM | POA: Diagnosis not present

## 2017-08-24 DIAGNOSIS — C44622 Squamous cell carcinoma of skin of right upper limb, including shoulder: Secondary | ICD-10-CM | POA: Diagnosis not present

## 2017-08-24 DIAGNOSIS — C44619 Basal cell carcinoma of skin of left upper limb, including shoulder: Secondary | ICD-10-CM | POA: Diagnosis not present

## 2017-08-25 DIAGNOSIS — M5116 Intervertebral disc disorders with radiculopathy, lumbar region: Secondary | ICD-10-CM | POA: Diagnosis not present

## 2017-08-25 DIAGNOSIS — M4316 Spondylolisthesis, lumbar region: Secondary | ICD-10-CM | POA: Diagnosis not present

## 2017-08-25 DIAGNOSIS — M4126 Other idiopathic scoliosis, lumbar region: Secondary | ICD-10-CM | POA: Diagnosis not present

## 2017-08-25 DIAGNOSIS — M48062 Spinal stenosis, lumbar region with neurogenic claudication: Secondary | ICD-10-CM | POA: Diagnosis not present

## 2017-09-18 ENCOUNTER — Ambulatory Visit (INDEPENDENT_AMBULATORY_CARE_PROVIDER_SITE_OTHER): Payer: Medicare Other

## 2017-09-18 DIAGNOSIS — E538 Deficiency of other specified B group vitamins: Secondary | ICD-10-CM

## 2017-09-18 MED ORDER — CYANOCOBALAMIN 1000 MCG/ML IJ SOLN
1000.0000 ug | Freq: Once | INTRAMUSCULAR | Status: AC
  Administered 2017-09-18: 1000 ug via INTRAMUSCULAR

## 2017-10-04 ENCOUNTER — Ambulatory Visit: Payer: Medicare Other

## 2017-10-06 ENCOUNTER — Ambulatory Visit (INDEPENDENT_AMBULATORY_CARE_PROVIDER_SITE_OTHER): Payer: Medicare Other

## 2017-10-06 ENCOUNTER — Telehealth: Payer: Self-pay

## 2017-10-06 DIAGNOSIS — M810 Age-related osteoporosis without current pathological fracture: Secondary | ICD-10-CM

## 2017-10-06 MED ORDER — ROMOSOZUMAB-AQQG 105 MG/1.17ML ~~LOC~~ SOSY
210.0000 mg | PREFILLED_SYRINGE | Freq: Once | SUBCUTANEOUS | Status: AC
  Administered 2017-10-06: 210 mg via SUBCUTANEOUS

## 2017-10-06 NOTE — Telephone Encounter (Signed)
Patient was in office to receive Evenity Injection. When I placed order for injection a high risk warning/contraindication populated. I advised patient that I will need to speak directly to Dr.Reed prior to administering.  I called Dr.Reed, no answer, left message requesting Dr.Reed to return call.  I called Joycelyn Schmid (drug rep for [employer]) and Joycelyn Schmid states she and Dr.Reed had a conversation about a patient that had a reaction/intolerance to prolia and Dr.Reed questioned if patient could still receive Evenity. The final conclusion was patient ok receive Evenity due to no previous cardiac issues, no history of stroke and no history of MI.  Dr.Reed returned call: Spoke with Dr.Reed via mobile, Dr.Reed was not in office (Dr.Carter was in office yet she was not the ordering doctor). Per Dr.Reed ok to inject due to no previous cardiac issues, no history of stroke and no history of MI. Dr.Reed states she also dicussed this with the patient previously.  All above conversations and injection administration witnessed by Jannet Mantis and Rodena Piety May/CMA  Patient stayed in office 30 min post injection to confirm no reaction, patient with no visible reaction, appeared to be in normal state and stated "I feel fine and I will let you all know if I don't- giggled afterwards."   S.Chrae B/CMA

## 2017-10-06 NOTE — Telephone Encounter (Signed)
Spoke with patient, patient was excited to hear this news. Patient stated in the future she will consider putting B12 and Evenity appointment's together. Patient stated even if that means stretching out her B12 appointment a little further if needed. Patient did not wish to make changes to appointments at the time of call.   Patient also stated I am doing fine for she thought I was calling to see if she tolerated Evenity injections well today, I was glad to hear patient was doing well.

## 2017-10-06 NOTE — Telephone Encounter (Signed)
Patient was in office today and questioned if in the future she can receive B12 and Evenity injection on the same day. Patient states its a lot to come here 2 x monthly. Patient [place] and will have to make plans to come to office 2 x month for b12 and Evenity.   Please advise

## 2017-10-06 NOTE — Telephone Encounter (Signed)
It should be ok to get both b12 and evenity at once in the future as long as we document the sites for both and monitor her after her injections.  She's had the b12 injections for years without a problem so if something new develops, it's likely from the evenity.

## 2017-10-18 ENCOUNTER — Ambulatory Visit: Payer: Medicare Other

## 2017-10-19 ENCOUNTER — Ambulatory Visit (INDEPENDENT_AMBULATORY_CARE_PROVIDER_SITE_OTHER): Payer: Medicare Other

## 2017-10-19 DIAGNOSIS — E538 Deficiency of other specified B group vitamins: Secondary | ICD-10-CM

## 2017-10-19 MED ORDER — CYANOCOBALAMIN 1000 MCG/ML IJ SOLN
1000.0000 ug | Freq: Once | INTRAMUSCULAR | Status: AC
  Administered 2017-10-19: 1000 ug via INTRAMUSCULAR

## 2017-10-31 ENCOUNTER — Encounter: Payer: Self-pay | Admitting: Internal Medicine

## 2017-11-01 DIAGNOSIS — D045 Carcinoma in situ of skin of trunk: Secondary | ICD-10-CM | POA: Diagnosis not present

## 2017-11-01 DIAGNOSIS — D229 Melanocytic nevi, unspecified: Secondary | ICD-10-CM | POA: Diagnosis not present

## 2017-11-08 DIAGNOSIS — H43821 Vitreomacular adhesion, right eye: Secondary | ICD-10-CM | POA: Diagnosis not present

## 2017-11-08 DIAGNOSIS — Z961 Presence of intraocular lens: Secondary | ICD-10-CM | POA: Diagnosis not present

## 2017-11-08 DIAGNOSIS — H52223 Regular astigmatism, bilateral: Secondary | ICD-10-CM | POA: Diagnosis not present

## 2017-11-08 DIAGNOSIS — H524 Presbyopia: Secondary | ICD-10-CM | POA: Diagnosis not present

## 2017-11-08 DIAGNOSIS — H5203 Hypermetropia, bilateral: Secondary | ICD-10-CM | POA: Diagnosis not present

## 2017-11-08 DIAGNOSIS — H18413 Arcus senilis, bilateral: Secondary | ICD-10-CM | POA: Diagnosis not present

## 2017-11-08 DIAGNOSIS — H35371 Puckering of macula, right eye: Secondary | ICD-10-CM | POA: Diagnosis not present

## 2017-11-10 ENCOUNTER — Ambulatory Visit: Payer: Medicare Other

## 2017-11-14 ENCOUNTER — Telehealth: Payer: Self-pay

## 2017-11-14 NOTE — Telephone Encounter (Signed)
Late Entry- We were unable to access voicemail in referral area. Agricultural engineer had to reset voicemail password. Referral seat is vacant and all [facility] staff is filling in when time allowed  Message left on referral voicemail 11/06/17 @ 12:51 pm. Marcie Bal with [employer] called to inform staff that summary of benefits was faxed on patient. Marcie Bal instructed that we call her if we have questions or concerns.  I spoke with Coralyn Mark Lane/CMA whom is assisting with [employer] authorization. Per Coralyn Mark summary of benefits received and notation made on patient's pending appointment notes for 11/21/17

## 2017-11-21 ENCOUNTER — Ambulatory Visit: Payer: Medicare Other

## 2017-11-23 ENCOUNTER — Ambulatory Visit (INDEPENDENT_AMBULATORY_CARE_PROVIDER_SITE_OTHER): Payer: Medicare Other

## 2017-11-23 DIAGNOSIS — E538 Deficiency of other specified B group vitamins: Secondary | ICD-10-CM

## 2017-11-23 DIAGNOSIS — M810 Age-related osteoporosis without current pathological fracture: Secondary | ICD-10-CM

## 2017-11-23 MED ORDER — CYANOCOBALAMIN 1000 MCG/ML IJ SOLN
1000.0000 ug | Freq: Once | INTRAMUSCULAR | Status: AC
  Administered 2017-11-23: 1000 ug via INTRAMUSCULAR

## 2017-11-23 MED ORDER — ROMOSOZUMAB-AQQG 105 MG/1.17ML ~~LOC~~ SOSY
210.0000 mg | PREFILLED_SYRINGE | Freq: Once | SUBCUTANEOUS | Status: AC
  Administered 2017-11-23: 210 mg via SUBCUTANEOUS

## 2018-01-23 ENCOUNTER — Ambulatory Visit (INDEPENDENT_AMBULATORY_CARE_PROVIDER_SITE_OTHER): Payer: Medicare Other

## 2018-01-23 DIAGNOSIS — E538 Deficiency of other specified B group vitamins: Secondary | ICD-10-CM | POA: Diagnosis not present

## 2018-01-23 DIAGNOSIS — M810 Age-related osteoporosis without current pathological fracture: Secondary | ICD-10-CM

## 2018-01-23 MED ORDER — ROMOSOZUMAB-AQQG 105 MG/1.17ML ~~LOC~~ SOSY
210.0000 mg | PREFILLED_SYRINGE | Freq: Once | SUBCUTANEOUS | Status: AC
  Administered 2018-01-23: 210 mg via SUBCUTANEOUS

## 2018-01-23 MED ORDER — CYANOCOBALAMIN 1000 MCG/ML IJ SOLN
1000.0000 ug | Freq: Once | INTRAMUSCULAR | Status: AC
  Administered 2018-01-23: 1000 ug via INTRAMUSCULAR

## 2018-02-01 MED ORDER — ROMOSOZUMAB-AQQG 105 MG/1.17ML ~~LOC~~ SOSY: 105.0000 mg | PREFILLED_SYRINGE | Freq: Once | SUBCUTANEOUS | Status: DC

## 2018-02-01 NOTE — Addendum Note (Signed)
Addended by: Denyse Amass on: 02/01/2018 02:40 PM   Modules accepted: Orders

## 2018-02-12 ENCOUNTER — Ambulatory Visit (INDEPENDENT_AMBULATORY_CARE_PROVIDER_SITE_OTHER): Payer: Medicare Other | Admitting: Internal Medicine

## 2018-02-12 ENCOUNTER — Encounter: Payer: Self-pay | Admitting: Internal Medicine

## 2018-02-12 VITALS — BP 116/68 | HR 65 | Temp 97.6°F | Ht 61.0 in | Wt 174.6 lb

## 2018-02-12 DIAGNOSIS — E538 Deficiency of other specified B group vitamins: Secondary | ICD-10-CM

## 2018-02-12 DIAGNOSIS — M150 Primary generalized (osteo)arthritis: Secondary | ICD-10-CM

## 2018-02-12 DIAGNOSIS — E669 Obesity, unspecified: Secondary | ICD-10-CM | POA: Diagnosis not present

## 2018-02-12 DIAGNOSIS — Z23 Encounter for immunization: Secondary | ICD-10-CM

## 2018-02-12 DIAGNOSIS — M5416 Radiculopathy, lumbar region: Secondary | ICD-10-CM | POA: Diagnosis not present

## 2018-02-12 DIAGNOSIS — I10 Essential (primary) hypertension: Secondary | ICD-10-CM | POA: Diagnosis not present

## 2018-02-12 DIAGNOSIS — M8949 Other hypertrophic osteoarthropathy, multiple sites: Secondary | ICD-10-CM

## 2018-02-12 DIAGNOSIS — M810 Age-related osteoporosis without current pathological fracture: Secondary | ICD-10-CM | POA: Diagnosis not present

## 2018-02-12 DIAGNOSIS — M159 Polyosteoarthritis, unspecified: Secondary | ICD-10-CM

## 2018-02-12 DIAGNOSIS — Z6832 Body mass index (BMI) 32.0-32.9, adult: Secondary | ICD-10-CM | POA: Insufficient documentation

## 2018-02-12 MED ORDER — LISINOPRIL 5 MG PO TABS: 5.0000 mg | ORAL_TABLET | Freq: Every day | ORAL | 3 refills | Status: DC

## 2018-02-12 NOTE — Patient Instructions (Addendum)
Try putting heat over your thigh, buttock area when you sit down for 2 mins ;)  Fat and Cholesterol Restricted Diet High levels of fat and cholesterol in your blood may lead to various health problems, such as diseases of the heart, blood vessels, gallbladder, liver, and pancreas. Fats are concentrated sources of energy that come in various forms. Certain types of fat, including saturated fat, may be harmful in excess. Cholesterol is a substance needed by your body in small amounts. Your body makes all the cholesterol it needs. Excess cholesterol comes from the food you eat. When you have high levels of cholesterol and saturated fat in your blood, health problems can develop because the excess fat and cholesterol will gather along the walls of your blood vessels, causing them to narrow. Choosing the right foods will help you control your intake of fat and cholesterol. This will help keep the levels of these substances in your blood within normal limits and reduce your risk of disease. What is my plan? Your health care provider recommends that you:  Limit your fat intake  Limit the amount of cholesterol in your diet   Eat 20-30 grams of fiber each day.  What types of fat should I choose?  Choose healthy fats more often. Choose monounsaturated and polyunsaturated fats, such as olive and canola oil, flaxseeds, walnuts, almonds, and seeds.  Eat more omega-3 fats. Good choices include salmon, mackerel, sardines, tuna, flaxseed oil, and ground flaxseeds. Aim to eat fish at least two times a week.  Limit saturated fats. Saturated fats are primarily found in animal products, such as meats, butter, and cream. Plant sources of saturated fats include palm oil, palm kernel oil, and coconut oil.  Avoid foods with partially hydrogenated oils in them. These contain trans fats. Examples of foods that contain trans fats are stick margarine, some tub margarines, cookies, crackers, and other baked goods. What  general guidelines do I need to follow? These guidelines for healthy eating will help you control your intake of fat and cholesterol:  Check food labels carefully to identify foods with trans fats or high amounts of saturated fat.  Fill one half of your plate with vegetables and green salads.  Fill one fourth of your plate with whole grains. Look for the word "whole" as the first word in the ingredient list.  Fill one fourth of your plate with lean protein foods.  Limit fruit to two servings a day. Choose fruit instead of juice.  Eat more foods that contain fiber, such as apples, broccoli, carrots, beans, peas, and barley.  Eat more home-cooked food and less restaurant, buffet, and fast food.  Limit or avoid alcohol.  Limit foods high in starch and sugar.  Limit fried foods.  Cook foods using methods other than frying. Baking, boiling, grilling, and broiling are all great options.  Lose weight if you are overweight. Losing just 5-10% of your initial body weight can help your overall health and prevent diseases such as diabetes and heart disease.  What foods can I eat? Grains  Whole grains, such as whole wheat or whole grain breads, crackers, cereals, and pasta. Unsweetened oatmeal, bulgur, barley, quinoa, or brown rice. Corn or whole wheat flour tortillas. Vegetables  Fresh or frozen vegetables (raw, steamed, roasted, or grilled). Green salads. Fruits  All fresh, canned (in natural juice), or frozen fruits. Meats and other protein foods  Ground beef (85% or leaner), grass-fed beef, or beef trimmed of fat. Skinless chicken or [place]. Ground chicken  or [place]. Pork trimmed of fat. All fish and seafood. Eggs. Dried beans, peas, or lentils. Unsalted nuts or seeds. Unsalted canned or dry beans. Dairy  Low-fat dairy products, such as skim or 1% milk, 2% or reduced-fat cheeses, low-fat ricotta or cottage cheese, or plain low-fat yo Fats and oils  Tub margarines without trans  fats. Light or reduced-fat mayonnaise and salad dressings. Avocado. Olive, canola, sesame, or safflower oils. Natural peanut or almond butter (choose ones without added sugar and oil). The items listed above may not be a complete list of recommended foods or beverages. Contact your dietitian for more options. Foods to avoid Grains  White bread. White pasta. White rice. Cornbread. Bagels, pastries, and croissants. Crackers that contain trans fat. Vegetables  White potatoes. Corn. Creamed or fried vegetables. Vegetables in a cheese sauce. Fruits  Dried fruits. Canned fruit in light or heavy syrup. Fruit juice. Meats and other protein foods  Fatty cuts of meat. Ribs, chicken wings, bacon, sausage, bologna, salami, chitterlings, fatback, hot dogs, bratwurst, and packaged luncheon meats. Liver and organ meats. Dairy  Whole or 2% milk, cream, half-and-half, and cream cheese. Whole milk cheeses. Whole-fat or sweetened yogurt. Full-fat cheeses. Nondairy creamers and whipped toppings. Processed cheese, cheese spreads, or cheese curds. Beverages  Alcohol. Sweetened drinks (such as sodas, lemonade, and fruit drinks or punches). Fats and oils  Butter, stick margarine, lard, shortening, ghee, or bacon fat. Coconut, palm kernel, or palm oils. Sweets and desserts  Corn syrup, sugars, honey, and molasses. Candy. Jam and jelly. Syrup. Sweetened cereals. Cookies, pies, cakes, donuts, muffins, and ice cream. The items listed above may not be a complete list of foods and beverages to avoid. Contact your dietitian for more information. This information is not intended to replace advice given to you by your health care provider. Make sure you discuss any questions you have with your health care provider. Document Released: 03/14/2005 Document Revised: 04/04/2014 Document Reviewed: 06/12/2013 Elsevier Interactive Patient Education  2018 [place] for Gastroesophageal Reflux Disease,  Adult When you have gastroesophageal reflux disease (GERD), the foods you eat and your eating habits are very important. Choosing the right foods can help ease the discomfort of GERD. Consider working with a diet and nutrition specialist (dietitian) to help you make healthy food choices. What general guidelines should I follow? Eating plan  Choose healthy foods low in fat, such as fruits, vegetables, whole grains, low-fat dairy products, and lean meat, fish, and poultry.  Eat frequent, small meals instead of three large meals each day. Eat your meals slowly, in a relaxed setting. Avoid bending over or lying down until 2-3 hours after eating.  Limit high-fat foods such as fatty meats or fried foods.  Limit your intake of oils, butter, and shortening to less than 8 teaspoons each day.  Avoid the following: ? Foods that cause symptoms. These may be different for different people. Keep a food diary to keep track of foods that cause symptoms. ? Alcohol. ? Drinking large amounts of liquid with meals. ? Eating meals during the 2-3 hours before bed.  Cook foods using methods other than frying. This may include baking, grilling, or broiling. Lifestyle   Maintain a healthy weight. Ask your health care provider what weight is healthy for you. If you need to lose weight, work with your health care provider to do so safely.  Exercise for at least 30 minutes on 5 or more days each week, or as told by your  health care provider.  Avoid wearing clothes that fit tightly around your waist and chest.  Do not use any products that contain nicotine or tobacco, such as cigarettes and e-cigarettes. If you need help quitting, ask your health care provider.  Sleep with the head of your bed raised. Use a wedge under the mattress or blocks under the bed frame to raise the head of the bed. What foods are not recommended? The items listed may not be a complete list. Talk with your dietitian about what dietary  choices are best for you. Grains Pastries or quick breads with added fat. [place] toast. Vegetables Deep fried vegetables. [place] fries. Any vegetables prepared with added fat. Any vegetables that cause symptoms. For some people this may include tomatoes and tomato products, chili peppers, onions and garlic, and horseradish. Fruits Any fruits prepared with added fat. Any fruits that cause symptoms. For some people this may include citrus fruits, such as oranges, grapefruit, pineapple, and lemons. Meats and other protein foods High-fat meats, such as fatty beef or pork, hot dogs, ribs, ham, sausage, salami and bacon. Fried meat or protein, including fried fish and fried chicken. Nuts and nut butters. Dairy Whole milk and chocolate milk. Sour cream. Cream. Ice cream. Cream cheese. Milk shakes. Beverages Coffee and tea, with or without caffeine. Carbonated beverages. Sodas. Energy drinks. Fruit juice made with acidic fruits (such as orange or grapefruit). Tomato juice. Alcoholic drinks. Fats and oils Butter. Margarine. Shortening. Ghee. Sweets and desserts Chocolate and cocoa. Donuts. Seasoning and other foods Pepper. Peppermint and spearmint. Any condiments, herbs, or seasonings that cause symptoms. For some people, this may include curry, hot sauce, or vinegar-based salad dressings. Summary  When you have gastroesophageal reflux disease (GERD), food and lifestyle choices are very important to help ease the discomfort of GERD.  Eat frequent, small meals instead of three large meals each day. Eat your meals slowly, in a relaxed setting. Avoid bending over or lying down until 2-3 hours after eating.  Limit high-fat foods such as fatty meat or fried foods. This information is not intended to replace advice given to you by your health care provider. Make sure you discuss any questions you have with your health care provider. Document Released: 03/14/2005 Document Revised: 03/15/2016 Document  Reviewed: 03/15/2016 Elsevier Interactive Patient Education  [employer].

## 2018-02-12 NOTE — Progress Notes (Signed)
Location:  [facility] clinic Provider:  Gurshan Settlemire L. Mariea Clonts, D.O., C.M.D.  Code Status: full code Goals of Care:  Advanced Directives 02/12/2018  Does Patient Have a Medical Advance Directive? Yes  Type of Advance Directive [place]  Does patient want to make changes to medical advance directive? No - Patient declined  Copy of [place] in Chart? Yes - validated most recent copy scanned in chart (See row information)     Chief Complaint  Patient presents with  . Medical Management of Chronic Issues    blood pressure follow up,flu vaccine,discuss lisinopril    HPI: Patient is a 77 y.o. female seen today for medical management of chronic diseases.    Checks bp bid.  Had not been needing her second lisinopril 5mg .  She got to taking it once a day regularly, but [place] beach, [place] bottle of pills--here she has had old pills in all of the bottles.  Here bp was at goal.  Says the "qualitest" brand lisinopril as working better than the "exelan" brand  Needs her flu shot.    Reports she and her husband are on the waiting list for [facility] but may never go due to her husband would not like.  She says the swimming pool is too shallow for her water exercises.  She also likes to spend her money on her granddaughter.    Says back and joints are doing terrible.  Gabapentin helps.  She has an appt with spine and scoliosis thurs.  She was working hard at the beach--2 flights of stairs she was doing there.  Was taking 30 min walks without pain.  Hd not been walking at all before that.  She hurt herself almost 2 weeks ago.  Her graddaughter is in [place] and she did heavy shopping at [employer] (big bulky cart).  [place]--she got help from the car, but not down the steps to where she had to bring items. She made her left hip and buttocks sore with the high steps they had.  Then she had 12 hr and 13 hr days.  Friday, she had trouble  walking.  She still can't bend over.  Loosens up at the pool some.  Last Thursday, she did something in the shower--right lower buttock and into posterior thigh.  Ok when she first gets up, but it starts to hurt shortly after she's up.  Also has her chronic celebrex.    She only wants to see her PT she's worked with.  She's on the cancellation list.    Admits she does not have the stamina she once did.  Says there is no way she can have surgery with the osteoporosis she has.  She admits to cooking a little bit, but typically goes out for majority of meals.   Has new normal weight in mid 170s.    At night, she feels like there's a lot of saliva, it may wake her and she feels like she could vomit.  She has not taken anything for it.  Discussed conservative measures.    Past Medical History:  Diagnosis Date  . Anemia, unspecified   . B12 deficiency due to diet   . Cramp of limb   . Edema of both legs   . Encounter for long-term (current) use of other medications   . Lumbago   . Obesity   . Osteoarthrosis, unspecified whether generalized or localized, unspecified site   .  Restless legs syndrome (RLS)   . Senile osteoporosis     Past Surgical History:  Procedure Laterality Date  . CATARACT EXTRACTION W/ INTRAOCULAR LENS  IMPLANT, BILATERAL  04/2014  . 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 [place]  2005  . [place]   stapleing   . VITRECTOMY Right    with membrane peeling for a macular pucker    Allergies  Allergen Reactions  . Prolia [Denosumab] Other (See Comments)    Chest pain  . Percocet [Oxycodone-Acetaminophen]     Confusion   . Shrimp [Shellfish Allergy]     All seafood    Outpatient Encounter Medications as of 02/12/2018  Medication Sig  . Calcium Carb-Cholecalciferol (CALCIUM + D3) 600-200 MG-UNIT TABS Take one tablet three times daily  . celecoxib (CELEBREX) 100 MG capsule Take 1 capsule (100 mg total) by mouth 2 (two) times daily.  .  Cholecalciferol (VITAMIN D) 2000 UNITS tablet Take 2,000 Units by mouth daily.  . Cyanocobalamin (B-12 COMPLIANCE INJECTION IJ) Inject 1 mL as directed every 30 (thirty) days.  [name] gabapentin (NEURONTIN) 300 MG capsule Take 300 mg by mouth 3 (three) times daily.  . Iron 66 MG TABS Take one tablet once daily  . lisinopril (PRINIVIL,ZESTRIL) 5 MG tablet Take 1 tablet by mouth if your blood pressure is 140/90  . Multiple Vitamin (MULTIVITAMIN) tablet Take 1 tablet by mouth daily.  [name] acetaminophen (TYLENOL) 325 MG tablet Take 650 mg by mouth every 6 (six) hours as needed.   No facility-administered encounter medications on file as of 02/12/2018.     Review of Systems:  Review of Systems  Constitutional: Negative for chills, fever, malaise/fatigue and weight loss.  HENT: Negative for congestion and hearing loss.   Eyes: Negative for blurred vision.       Glasses  Respiratory: Negative for cough and shortness of breath.   Cardiovascular: Negative for chest pain, palpitations and leg swelling.  Gastrointestinal: Positive for heartburn and nausea. Negative for abdominal pain, blood in stool, constipation, diarrhea, melena and vomiting.  Genitourinary: Positive for urgency. Negative for dysuria.       Rare urgency  Musculoskeletal: Positive for back pain, joint pain and myalgias. Negative for falls and neck pain.       Right buttock and posterior thigh bothersome at present  Skin: Negative for itching and rash.  Neurological: Negative for dizziness and loss of consciousness.  Endo/Heme/Allergies: Bruises/bleeds easily.  Psychiatric/Behavioral: Negative for depression and memory loss. The patient is not nervous/anxious and does not have insomnia.     Health Maintenance  Topic Date Due  . MAMMOGRAM  06/27/2017  . INFLUENZA VACCINE  10/26/2017  . TETANUS/TDAP  09/05/2018  . DEXA SCAN  Completed  . PNA vac Low Risk Adult  Completed    Physical Exam: Vitals:   02/12/18 1040  BP: 116/68    Pulse: 65  Temp: 97.6 F (36.4 C)  TempSrc: Oral  SpO2: 95%  Weight: 174 lb 9.6 oz (79.2 kg)  Height: 5\' 1"  (1.549 m)   Body mass index is 32.99 kg/m. Physical Exam  Constitutional: She is oriented to person, place, and time. She appears well-developed and well-nourished. No distress.  HENT:  Head: Normocephalic and atraumatic.  Cardiovascular: Normal rate, regular rhythm, normal heart sounds and intact distal pulses.  Pulmonary/Chest: Effort normal and breath sounds normal. No respiratory distress.  Abdominal: Bowel sounds are normal.  Musculoskeletal: Normal range of motion. She exhibits tenderness.  Difficulty with lumbar flexion; tender over right medial distal buttocks and proximal thigh; uses cane  Neurological: She is alert and oriented to person, place, and time.  Skin: Skin is warm and dry. There is pallor.  Psychiatric: She has a normal mood and affect. Her behavior is normal. Judgment and thought content normal.    Labs reviewed: Basic Metabolic Panel: Recent Labs    08/17/17 0835  NA 141  K 4.3  CL 107  CO2 27  GLUCOSE 87  BUN 30*  CREATININE 1.00*  CALCIUM 9.8   Liver Function Tests: Recent Labs    08/17/17 0835  AST 30  ALT 18  BILITOT 0.5  PROT 7.0   No results for input(s): LIPASE, AMYLASE in the last 8760 hours. No results for input(s): AMMONIA in the last 8760 hours. CBC: Recent Labs    08/17/17 0835  WBC 5.5  NEUTROABS 3,086  HGB 11.2*  HCT 33.3*  MCV 89.8  PLT 277   Lipid Panel: Recent Labs    08/17/17 0835  CHOL 185  HDL 64  LDLCALC 102*  TRIG 95  CHOLHDL 2.9   Lab Results  Component Value Date   HGBA1C 5.6 03/04/2013    Assessment/Plan 1. Benign essential hypertension -bp well controlled here with lisinopril use 5mg  po bid recently, but does not always take the second dose if bp under 140/90 - COMPLETE METABOLIC PANEL WITH GFR; Future - lisinopril (PRINIVIL,ZESTRIL) 5 MG tablet; Take 1 tablet (5 mg total) by mouth  daily. Take 1 additional tablet by mouth if your blood pressure is 140/90 in the afternoon  Dispense: 90 tablet; Refill: 3  2. Senile osteoporosis -cont evenity injections, ca with D, and additional D3, weightbearing with walking and continue water aerobics, as well  3. B12 deficiency due to diet - cont b12 injections due to poor dietary choices - CBC with Differential/Platelet; Future - Vitamin B12; Future  4. 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 Primary osteoarthritis involving multiple joints -continues celebrex with food  6. BMI 32.0-32.9,adult - counseled on diet and exercise regimen to lose weight--eats out a lot  - Lipid panel; Future  7. Obesity (BMI 30.0-34.9) - provided low fat and low cholesterol diet info - Lipid panel; Future  8. Need for influenza vaccination -high dose flu shot given  Labs/tests ordered:   Orders Placed This Encounter  Procedures  . CBC with Differential/Platelet    Standing Status:   Future    Standing Expiration Date:   02/13/2019  . COMPLETE METABOLIC PANEL WITH GFR    Standing Status:   Future    Standing Expiration Date:   02/13/2019  . Lipid panel    Standing Status:   Future    Standing Expiration Date:   02/13/2019  . Vitamin B12    Standing Status:   Future    Standing Expiration Date:   02/13/2019   Next appt:  02/26/2018 for injection; 6 mos med mgt fasting labs before  Rosena Bartle L. Kember Boch, D.O. [facility] [address], [place] [address] Cell Phone (Mon-Fri 8am-5pm):  [phone] On Call:  [phone] & follow prompts after 5pm & weekends Office Phone:  [phone] Office Fax:  [phone]   

## 2018-02-12 NOTE — Addendum Note (Signed)
Addended by: Vonna Kotyk A on: 02/12/2018 01:27 PM   Modules accepted: Orders

## 2018-02-13 ENCOUNTER — Encounter: Payer: Self-pay | Admitting: Internal Medicine

## 2018-02-13 DIAGNOSIS — I10 Essential (primary) hypertension: Secondary | ICD-10-CM

## 2018-02-13 MED ORDER — LISINOPRIL 5 MG PO TABS: 5.0000 mg | ORAL_TABLET | Freq: Every day | ORAL | 3 refills | Status: DC

## 2018-02-15 MED ORDER — LISINOPRIL 5 MG PO TABS: 5.0000 mg | ORAL_TABLET | Freq: Every day | ORAL | 3 refills | Status: DC

## 2018-02-15 NOTE — Addendum Note (Signed)
Addended by: Denyse Amass on: 02/15/2018 08:50 AM   Modules accepted: Orders

## 2018-02-16 DIAGNOSIS — M549 Dorsalgia, unspecified: Secondary | ICD-10-CM | POA: Diagnosis not present

## 2018-02-16 DIAGNOSIS — M4126 Other idiopathic scoliosis, lumbar region: Secondary | ICD-10-CM | POA: Diagnosis not present

## 2018-02-16 DIAGNOSIS — M48062 Spinal stenosis, lumbar region with neurogenic claudication: Secondary | ICD-10-CM | POA: Diagnosis not present

## 2018-02-16 DIAGNOSIS — M5116 Intervertebral disc disorders with radiculopathy, lumbar region: Secondary | ICD-10-CM | POA: Diagnosis not present

## 2018-02-16 DIAGNOSIS — M4316 Spondylolisthesis, lumbar region: Secondary | ICD-10-CM | POA: Diagnosis not present

## 2018-02-26 ENCOUNTER — Ambulatory Visit (INDEPENDENT_AMBULATORY_CARE_PROVIDER_SITE_OTHER): Payer: Medicare Other

## 2018-02-26 DIAGNOSIS — Z8781 Personal history of (healed) traumatic fracture: Secondary | ICD-10-CM | POA: Diagnosis not present

## 2018-02-26 DIAGNOSIS — M810 Age-related osteoporosis without current pathological fracture: Secondary | ICD-10-CM | POA: Diagnosis not present

## 2018-02-26 DIAGNOSIS — E538 Deficiency of other specified B group vitamins: Secondary | ICD-10-CM | POA: Diagnosis not present

## 2018-02-26 MED ORDER — ROMOSOZUMAB-AQQG 105 MG/1.17ML ~~LOC~~ SOSY
211.0000 mg | PREFILLED_SYRINGE | Freq: Once | SUBCUTANEOUS | Status: AC
  Administered 2018-02-26: 211 mg via SUBCUTANEOUS

## 2018-02-26 MED ORDER — CYANOCOBALAMIN 1000 MCG/ML IJ SOLN
1000.0000 ug | Freq: Once | INTRAMUSCULAR | Status: AC
  Administered 2018-02-26: 1000 ug via INTRAMUSCULAR

## 2018-03-22 ENCOUNTER — Telehealth (INDEPENDENT_AMBULATORY_CARE_PROVIDER_SITE_OTHER): Payer: Self-pay | Admitting: Physical Medicine and Rehabilitation

## 2018-03-22 NOTE — Telephone Encounter (Signed)
Patient came into the office to make an appointment with Dr. Ernestina Patches for another injection.  CB#[id number].  Thank you.

## 2018-03-23 NOTE — Telephone Encounter (Signed)
Ok to schedule another injection?

## 2018-03-24 ENCOUNTER — Encounter: Payer: Self-pay | Admitting: Internal Medicine

## 2018-03-24 ENCOUNTER — Encounter (HOSPITAL_COMMUNITY): Payer: Self-pay | Admitting: Emergency Medicine

## 2018-03-24 ENCOUNTER — Emergency Department (HOSPITAL_COMMUNITY): Payer: Medicare Other

## 2018-03-24 ENCOUNTER — Emergency Department (HOSPITAL_COMMUNITY)
Admission: EM | Admit: 2018-03-24 | Discharge: 2018-03-24 | Disposition: A | Discharge status: Home / Self Care | Payer: Medicare Other | Attending: Emergency Medicine | Admitting: Emergency Medicine

## 2018-03-24 ENCOUNTER — Other Ambulatory Visit: Payer: Self-pay

## 2018-03-24 DIAGNOSIS — R0902 Hypoxemia: Secondary | ICD-10-CM | POA: Diagnosis not present

## 2018-03-24 DIAGNOSIS — M25511 Pain in right shoulder: Secondary | ICD-10-CM | POA: Diagnosis not present

## 2018-03-24 DIAGNOSIS — Z87891 Personal history of nicotine dependence: Secondary | ICD-10-CM | POA: Insufficient documentation

## 2018-03-24 DIAGNOSIS — R52 Pain, unspecified: Secondary | ICD-10-CM | POA: Diagnosis not present

## 2018-03-24 MED ORDER — HYDROCODONE-ACETAMINOPHEN 5-325 MG PO TABS: 1.0000 | ORAL_TABLET | Freq: Four times a day (QID) | ORAL | 0 refills | Status: DC | PRN

## 2018-03-24 MED ORDER — LIDOCAINE 5 % EX OINT: 1.0000 "application " | TOPICAL_OINTMENT | Freq: Three times a day (TID) | CUTANEOUS | 0 refills | Status: DC | PRN

## 2018-03-24 MED ORDER — MORPHINE SULFATE (PF) 2 MG/ML IV SOLN: 2.0000 mg | INTRAVENOUS | Status: DC | PRN

## 2018-03-24 NOTE — ED Provider Notes (Signed)
Emergency Department Provider Note   I have reviewed the triage vital signs and the nursing notes.   HISTORY  Chief Complaint No chief complaint on file.   HPI Jody [name] is a 77 y.o. female with PMH of anemia, OA, and RLS presents to the emergency department by EMS with severe shoulder pain.  The patient states she was at home and reaching back with her right arm to catch a screen door when she suddenly felt severe pain in the right shoulder.  She denies fall to the ground or other trauma to the shoulder.  She was unable to find a position of comfort.  EMS was called who placed the patient in a splint and gave a total of 150 mcg of fentanyl in route.  She does feels some tingling in the hand but no numbness.  No pain in the elbow or wrist on the right.  No falls or head trauma.  She did drink some coffee this morning but has not eaten since yesterday.   Past Medical History:  Diagnosis Date  . Anemia, unspecified   . B12 deficiency due to diet   . Cramp of limb   . Edema of both legs   . Encounter for Zayon Trulson-term (current) use of other medications   . Lumbago   . Obesity   . Osteoarthrosis, unspecified whether generalized or localized, unspecified site   . Restless legs syndrome (RLS)   . Senile osteoporosis     Patient Active Problem List   Diagnosis Date Noted  . BMI 32.0-32.9,adult 02/12/2018  . Obesity (BMI 30.0-34.9) 07/17/2017  . Impingement syndrome of right shoulder 07/04/2016  . Chronic pain of both knees 07/04/2016  . 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 Displaced transverse fracture of left patella, subsequent encounter for closed fracture with delayed healing 02/03/2016  . Finger fracture, left 02/03/2016  . Leg cramps, sleep related 11/19/2015  . Absolute anemia 05/16/2014  . Primary osteoarthritis involving multiple joints 05/16/2014  . Cataract cortical, senile 05/16/2014  . Benign essential hypertension  09/03/2012  . Edema of both legs   . Senile osteoporosis   . B12 deficiency due to diet     Past Surgical History:  Procedure Laterality Date  . CATARACT EXTRACTION W/ INTRAOCULAR LENS  IMPLANT, BILATERAL  04/2014  . 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 [place]  2005  . [place]   stapleing   . VITRECTOMY Right    with membrane peeling for a macular pucker    Allergies Prolia [denosumab]; Percocet [oxycodone-acetaminophen]; and Shrimp [shellfish allergy]  Family History  Problem Relation Age of Onset  . Cancer Sister   . Cancer Brother     Social History Social History   Tobacco Use  . Smoking status: Former Smoker    Last attempt to quit: 05/22/1975    Years since quitting: 42.8  . Smokeless tobacco: Never Used  . Tobacco comment: Quit in early 30's  Substance Use Topics  . Alcohol use: No    Alcohol/week: 0.0 standard drinks  . Drug use: No    Review of Systems  Constitutional: No fever/chills Eyes: No visual changes. ENT: No sore throat. Cardiovascular: Denies chest pain. Respiratory: Denies shortness of breath. Gastrointestinal: No abdominal pain.  No nausea, no vomiting.  No diarrhea.  No constipation. Genitourinary: Negative for dysuria. Musculoskeletal: Negative for back pain. Positive right shoulder pain.  Skin: Negative for rash. Neurological: Negative  for headaches, focal weakness or numbness.  10-point ROS otherwise negative.  ____________________________________________   PHYSICAL EXAM:  VITAL SIGNS: ED Triage Vitals  Enc Vitals Group     BP 03/24/18 0917 (!) 144/67     Pulse Rate 03/24/18 0917 72     Resp 03/24/18 0917 15     Temp 03/24/18 0917 97.7 F (36.5 C)     Temp Source 03/24/18 0917 Oral     SpO2 03/24/18 0910 97 %   Constitutional: Alert and oriented. Well appearing and in no acute distress. Eyes: Conjunctivae are normal.  Head: Atraumatic. Nose: No congestion/rhinnorhea. Mouth/Throat: Mucous  membranes are moist.  Neck: No stridor.   Cardiovascular: Good peripheral circulation. Palpable right radial pulse.  Respiratory: Normal respiratory effort. Gastrointestinal: No distention.  Musculoskeletal: No lower extremity tenderness nor edema. Right shoulder in sling. Palpable right radial pulse. No tenderness over the right elbow or wrist. Normal grip. Limited ROM of the right shoulder secondary to severe pain.  Neurologic:  Normal speech and language. No sensory deficits appreciated in the RUE.  Skin:  Skin is warm, dry and intact. No rash noted.   ____________________________________________  [id number]  Dg Shoulder Right  Result Date: 03/24/2018 CLINICAL DATA:  pt here with c/o of right shoulder pain. Pt has a HX of severe osteoarthritis and was attempting to reach back and "catch a door" that was closing today in am. EXAM: RIGHT SHOULDER - 2+ VIEW COMPARISON:  03/16/2007 FINDINGS: There is no evidence of fracture or dislocation. Osteopenia. Subchondral irregularity in the humeral head. There is no other evidence of arthropathy or other focal bone abnormality. Soft tissues are unremarkable. IMPRESSION: Chronic appearing subchondral degenerative change in the humeral head. No fracture or dislocation. Electronically Signed   By: Lucrezia Europe M.D.   On: 03/24/2018 09:58    ____________________________________________   PROCEDURES  Procedure(s) performed:   Procedures  None ____________________________________________   INITIAL IMPRESSION / ASSESSMENT AND PLAN / ED COURSE  Pertinent labs & imaging results that were available during my care of the patient were reviewed by me and considered in my medical decision making (see chart for details).  Patient presents to the emergency department for evaluation of severe right shoulder pain.  Differential at this time is dislocation versus proximal humerus fracture.  Plan for imaging, pain medication, reassessment.  The arm is  neurovascularly intact.   Imaging with no acute fracture or dislocation. Will provide splint for comfort but cautioned to perform ROM exercises at least once per hour to prevent frozen joint. Provided contact information for ortho on call. Plan for discharge. Had discussion regarding pain medication. Patient requesting something stronger that Tylenol given severe pain. Provided a very small amount of Hydrocodone which she has tolerated in the past. Reviewed [place] controlled substance database.    ____________________________________________  FINAL CLINICAL IMPRESSION(S) / ED DIAGNOSES  Final diagnoses:  Acute pain of right shoulder    NEW OUTPATIENT MEDICATIONS STARTED DURING THIS VISIT:  Discharge Medication List as of 03/24/2018 10:23 AM    START taking these medications   Details  HYDROcodone-acetaminophen (NORCO/VICODIN) 5-325 MG tablet Take 1 tablet by mouth every 6 (six) hours as needed for severe pain., Starting Sat 03/24/2018, Print    lidocaine (XYLOCAINE) 5 % ointment Apply 1 application topically 3 (three) times daily as needed., Starting Sat 03/24/2018, Print        Note:  This document was prepared using Dragon voice recognition software and may include unintentional dictation errors.  Vonna Kotyk  Josephine Rudnick, MD Emergency Medicine    Jillianne Gamino, [facility], MD 03/24/18 2023

## 2018-03-24 NOTE — ED Notes (Signed)
Paged ortho tech 

## 2018-03-24 NOTE — ED Notes (Signed)
Patient transported to X-ray 

## 2018-03-24 NOTE — ED Notes (Signed)
Pt stable, ambulatory, and verbalizes understanding of d/c instructions.   Pt educated in regards to pain medications.

## 2018-03-24 NOTE — ED Triage Notes (Signed)
Per EMS: pt here with c/o of right shoulder pain.  Pt has a HX of severe osteoarthritis and was attempting to reach back and "catch a door" that was closing.  EMS noted deformity and possible dislocation.  Pt A/O x4.  EMS administered 15mcg of Fentanyl prior to arrival.

## 2018-03-24 NOTE — Discharge Instructions (Signed)

## 2018-03-26 NOTE — Telephone Encounter (Signed)
Left message advising patient that we need information from the [facility].

## 2018-03-26 NOTE — Telephone Encounter (Signed)
Last seen in December 2018 and by the looks of her primary care physician's note she saw Dr. Sherlyn Lick [facility] after that.  I would need to have more information on how her back is been treated over the last year.

## 2018-03-29 ENCOUNTER — Encounter: Payer: Self-pay | Admitting: Internal Medicine

## 2018-03-29 ENCOUNTER — Ambulatory Visit (INDEPENDENT_AMBULATORY_CARE_PROVIDER_SITE_OTHER): Payer: Medicare Other | Admitting: Internal Medicine

## 2018-03-29 VITALS — BP 128/70 | HR 75 | Ht 60.5 in | Wt 172.0 lb

## 2018-03-29 DIAGNOSIS — S43421A Sprain of right rotator cuff capsule, initial encounter: Secondary | ICD-10-CM

## 2018-03-29 NOTE — Progress Notes (Signed)
Location:  [facility] clinic Provider: Lilliauna Van L. Mariea Clonts, D.O., C.M.D.  Goals of Care:  Advanced Directives 03/24/2018  Does Patient Have a Medical Advance Directive? No  Type of Advance Directive -  Does patient want to make changes to medical advance directive? -  Copy of [place] in Chart? -     Chief Complaint  Patient presents with  . Acute Visit    right shoulder pain    HPI: Patient is a 78 y.o. female seen today for an acute visit for right shoulder pain.  She went to the ED 12/28 for severe right shoulder pain after reaching back with her right arm to catch a screen door and felt a severe pain shoot through it.  It scared the devil out of her.  It hurt so bad she could hardly breathe afterward.  She had her arm hanging down for relief.  She did not fall or otherwise injure the shoulder.She had come by EMS and was given a splint and 113mcg of fentanyl en route.  She could then get her shoulder up and put her arm on top of the countertop.  There was some tingling in her right hand at that time.  xrays of the right shoulder showed chronic subchondral degenerative change in the humeral head but no fx or dislocation.  She was given a small supply of hydrocodone and lidocaine ointment.  She is on chronic celebrex and gabapentin for her back and hip pains.  She's gone online and started to do ROM exercises, but she is able to do them.  She is also back to swimming at the pool 2x.  She still has pain.  She does not want a frozen shoulder.   She cannot internally rotate her arm to reach behind her.  Puling up in the pool was a challenge due to pain the first time, but she was able to do it the second time.  She sees her PT on Monday.    She did not fill the hydrocodone and the lidocaine cream is worthless.  Past Medical History:  Diagnosis Date  . Anemia, unspecified   . B12 deficiency due to diet   . Cramp of limb   . Edema of both legs   . Encounter for long-term  (current) use of other medications   . Lumbago   . Obesity   . Osteoarthrosis, unspecified whether generalized or localized, unspecified site   . Restless legs syndrome (RLS)   . Senile osteoporosis     Past Surgical History:  Procedure Laterality Date  . CATARACT EXTRACTION W/ INTRAOCULAR LENS  IMPLANT, BILATERAL  04/2014  . 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 [place]  2005  . [place]   stapleing   . VITRECTOMY Right    with membrane peeling for a macular pucker    Allergies  Allergen Reactions  . Prolia [Denosumab] Other (See Comments)    Chest pain  . Percocet [Oxycodone-Acetaminophen]     Confusion   . Shrimp [Shellfish Allergy]     All seafood    Outpatient Encounter Medications as of 03/29/2018  Medication Sig  . acetaminophen (TYLENOL) 325 MG tablet Take 650 mg by mouth every 6 (six) hours as needed.  . Calcium Carb-Cholecalciferol (CALCIUM + D3) 600-200 MG-UNIT TABS Take one tablet three times daily  . celecoxib (CELEBREX) 100 MG capsule Take 1 capsule (100 mg total) by mouth 2 (two) times daily.  . Cholecalciferol (  VITAMIN D) 2000 UNITS tablet Take 2,000 Units by mouth daily.  . Cyanocobalamin (B-12 COMPLIANCE INJECTION IJ) Inject 1 mL as directed every 30 (thirty) days.  [name] gabapentin (NEURONTIN) 300 MG capsule Take 300 mg by mouth 3 (three) times daily.  . Iron 66 MG TABS Take one tablet once daily  . lisinopril (PRINIVIL,ZESTRIL) 5 MG tablet Take 1 tablet (5 mg total) by mouth daily. Take 1 additional tablet by mouth if your blood pressure is 140/90 in the afternoon  . Multiple Vitamin (MULTIVITAMIN) tablet Take 1 tablet by mouth daily.  . Romosozumab-aqqg (EVENITY) 105 MG/1.17ML SOSY injection Inject 210 mg into the skin every 30 (thirty) days.  . [DISCONTINUED] HYDROcodone-acetaminophen (NORCO/VICODIN) 5-325 MG tablet Take 1 tablet by mouth every 6 (six) hours as needed for severe pain.  . [DISCONTINUED] lidocaine (XYLOCAINE) 5 %  ointment Apply 1 application topically 3 (three) times daily as needed.   No facility-administered encounter medications on file as of 03/29/2018.     Review of Systems:  Review of Systems  Constitutional: Negative for chills, fever and malaise/fatigue.  Eyes: Negative for blurred vision.  Respiratory: Negative for shortness of breath.   Cardiovascular: Negative for chest pain and palpitations.  Gastrointestinal: Negative for abdominal pain, constipation and diarrhea.  Genitourinary: Negative for dysuria.  Musculoskeletal: Positive for joint pain and myalgias. Negative for falls and neck pain.  Neurological: Positive for tingling and sensory change. Negative for dizziness.  Psychiatric/Behavioral: Negative for depression.    Health Maintenance  Topic Date Due  . MAMMOGRAM  06/28/2018  . TETANUS/TDAP  09/05/2018  . INFLUENZA VACCINE  Completed  . DEXA SCAN  Completed  . PNA vac Low Risk Adult  Completed    Physical Exam: Vitals:   03/29/18 0902  BP: 128/70  Pulse: 75  SpO2: 96%  Weight: 172 lb (78 kg)  Height: 5' 0.5" (1.537 m)   Body mass index is 33.04 kg/m. Physical Exam Constitutional:      Appearance: Normal appearance. She is obese.  HENT:     Head: Normocephalic and atraumatic.  Cardiovascular:     Rate and Rhythm: Normal rate and regular rhythm.     Pulses: Normal pulses.     Heart sounds: Normal heart sounds.  Musculoskeletal:     Comments: No gross physical differences b/w shoulders on exam; Right shoulder tender over posterior shoulder--teres major/infraspinatus region/obliques, decreased internal rotation (pt reports some mild limitation at baseline with this shoulder); full ROM otherwise (abduction, flexion, pronation, supination, external rotation), also some mild tenderness of anterior rotator cuff   Skin:    General: Skin is warm and dry.  Neurological:     Mental Status: She is alert.     Labs reviewed: Basic Metabolic Panel: Recent Labs     08/17/17 0835  NA 141  K 4.3  CL 107  CO2 27  GLUCOSE 87  BUN 30*  CREATININE 1.00*  CALCIUM 9.8   Liver Function Tests: Recent Labs    08/17/17 0835  AST 30  ALT 18  BILITOT 0.5  PROT 7.0   No results for input(s): LIPASE, AMYLASE in the last 8760 hours. No results for input(s): AMMONIA in the last 8760 hours. CBC: Recent Labs    08/17/17 0835  WBC 5.5  NEUTROABS 3,086  HGB 11.2*  HCT 33.3*  MCV 89.8  PLT 277   Lipid Panel: Recent Labs    08/17/17 0835  CHOL 185  HDL 64  LDLCALC 102*  TRIG 95  CHOLHDL  2.9   Lab Results  Component Value Date   HGBA1C 5.6 03/04/2013    Procedures since last visit: Dg Shoulder Right  Result Date: 03/24/2018 CLINICAL DATA:  pt here with c/o of right shoulder pain. Pt has a HX of severe osteoarthritis and was attempting to reach back and "catch a door" that was closing today in am. EXAM: RIGHT SHOULDER - 2+ VIEW COMPARISON:  03/16/2007 FINDINGS: There is no evidence of fracture or dislocation. Osteopenia. Subchondral irregularity in the humeral head. There is no other evidence of arthropathy or other focal bone abnormality. Soft tissues are unremarkable. IMPRESSION: Chronic appearing subchondral degenerative change in the humeral head. No fracture or dislocation. Electronically Signed   By: Lucrezia Europe M.D.   On: 03/24/2018 09:58    Assessment/Plan 1. Sprain of right rotator cuff capsule, initial encounter -when reached behind her; now internal rotation limited to reach behind her, cannot fasten bra -suspect sprain/strain or partial tear of rotator cuff; does have subchondral arthritis -pt has had dramatic improvement over past 5 days with ROM exercises and return to swimming -did not even need to take hydrocodone -has PT already set up for dry needling and evaluation so will see how this goes--if she is still having difficulty, might pursue MRI and ortho visit  Labs/tests ordered:  No orders of the defined types were placed  in this encounter.  Next appt:  04/02/2018--b12 injection and evenity, sees me in may for next routine visit  Louiza Moor L. Shylin Keizer, D.O. [facility] [address], [place] [address] Cell Phone (Mon-Fri 8am-5pm):  [phone] On Call:  [phone] & follow prompts after 5pm & weekends Office Phone:  [phone] Office Fax:  [phone]

## 2018-04-02 ENCOUNTER — Ambulatory Visit: Payer: Medicare Other

## 2018-04-03 ENCOUNTER — Telehealth: Payer: Self-pay

## 2018-04-03 NOTE — Telephone Encounter (Signed)
Left message on voicemail for patient to return call when available . Reason for call: Patient no showed on 04/02/2018 for nurse visit (B12 and Evenity). Patient needs to reschedule at her earliest convenience.

## 2018-04-04 ENCOUNTER — Ambulatory Visit (INDEPENDENT_AMBULATORY_CARE_PROVIDER_SITE_OTHER): Payer: Medicare Other

## 2018-04-04 ENCOUNTER — Telehealth: Payer: Self-pay

## 2018-04-04 DIAGNOSIS — E538 Deficiency of other specified B group vitamins: Secondary | ICD-10-CM | POA: Diagnosis not present

## 2018-04-04 DIAGNOSIS — M810 Age-related osteoporosis without current pathological fracture: Secondary | ICD-10-CM

## 2018-04-04 MED ORDER — CYANOCOBALAMIN 1000 MCG/ML IJ SOLN
1000.0000 ug | Freq: Once | INTRAMUSCULAR | Status: AC
  Administered 2018-04-04: 1000 ug via INTRAMUSCULAR

## 2018-04-04 MED ORDER — ROMOSOZUMAB-AQQG 105 MG/1.17ML ~~LOC~~ SOSY
210.0000 mg | PREFILLED_SYRINGE | Freq: Once | SUBCUTANEOUS | Status: AC
  Administered 2018-04-04: 210 mg via SUBCUTANEOUS

## 2018-04-04 NOTE — Telephone Encounter (Signed)
Pharmaceutical rep Chinita Pester) informed and plans to report incident to Designer, fashion/clothing

## 2018-04-04 NOTE — Telephone Encounter (Signed)
Patient was in office to get her Evenity injection and after the second injection patient states she could feel the medication flow through her body down to her toes"kinda tingly". Tingly feeling lasted about 60 seconds and stopped. Patients states this happened the last time yet she didn't think much about it to mention. Patient did not express any symptoms of distress. Patient denied chest pain, SOB, dizziness or blurred vision.   Patient states I feel fine and was released due to subsequent administration of injection and assurance that she was fine. Patient aware I will inform her PCP and follow up with any additional recommendations or advice if given.

## 2018-04-04 NOTE — Telephone Encounter (Signed)
Let's inform the pharmaceutical company of these symptoms.  It's ok to continue them if she's ok with continuing it.

## 2018-04-05 DIAGNOSIS — M4126 Other idiopathic scoliosis, lumbar region: Secondary | ICD-10-CM | POA: Diagnosis not present

## 2018-04-05 DIAGNOSIS — M47816 Spondylosis without myelopathy or radiculopathy, lumbar region: Secondary | ICD-10-CM | POA: Diagnosis not present

## 2018-04-05 DIAGNOSIS — M48062 Spinal stenosis, lumbar region with neurogenic claudication: Secondary | ICD-10-CM | POA: Diagnosis not present

## 2018-04-05 DIAGNOSIS — M4316 Spondylolisthesis, lumbar region: Secondary | ICD-10-CM | POA: Diagnosis not present

## 2018-04-09 DIAGNOSIS — M47816 Spondylosis without myelopathy or radiculopathy, lumbar region: Secondary | ICD-10-CM | POA: Diagnosis not present

## 2018-04-17 DIAGNOSIS — M5136 Other intervertebral disc degeneration, lumbar region: Secondary | ICD-10-CM | POA: Diagnosis not present

## 2018-05-09 ENCOUNTER — Ambulatory Visit (INDEPENDENT_AMBULATORY_CARE_PROVIDER_SITE_OTHER): Payer: Medicare Other

## 2018-05-09 ENCOUNTER — Telehealth: Payer: Self-pay | Admitting: Podiatry

## 2018-05-09 DIAGNOSIS — M810 Age-related osteoporosis without current pathological fracture: Secondary | ICD-10-CM | POA: Diagnosis not present

## 2018-05-09 DIAGNOSIS — E538 Deficiency of other specified B group vitamins: Secondary | ICD-10-CM

## 2018-05-09 MED ORDER — ROMOSOZUMAB-AQQG 105 MG/1.17ML ~~LOC~~ SOSY
210.0000 mg | PREFILLED_SYRINGE | Freq: Once | SUBCUTANEOUS | Status: AC
  Administered 2018-05-09: 210 mg via SUBCUTANEOUS

## 2018-05-09 MED ORDER — CYANOCOBALAMIN 1000 MCG/ML IJ SOLN
1000.0000 ug | Freq: Once | INTRAMUSCULAR | Status: AC
  Administered 2018-05-09: 1000 ug via INTRAMUSCULAR

## 2018-05-09 NOTE — Telephone Encounter (Signed)
Pt left message stating she was having a problem with her orthotics and needed to talk to someone about them.   I returned call and left message for pt to call me back to discuss.

## 2018-05-14 ENCOUNTER — Ambulatory Visit: Payer: Medicare Other | Admitting: Orthotics

## 2018-05-14 DIAGNOSIS — M1000 Idiopathic gout, unspecified site: Secondary | ICD-10-CM

## 2018-05-14 DIAGNOSIS — M79672 Pain in left foot: Secondary | ICD-10-CM

## 2018-05-14 DIAGNOSIS — M779 Enthesopathy, unspecified: Secondary | ICD-10-CM

## 2018-05-14 NOTE — Progress Notes (Signed)
Added arch support under LEFT foot to see if it would help with her f/o making it seemed as she had "bunched up sock".  Will try and make another appointment with me in the futre for the RIGHT.

## 2018-05-15 DIAGNOSIS — M4126 Other idiopathic scoliosis, lumbar region: Secondary | ICD-10-CM | POA: Diagnosis not present

## 2018-05-15 DIAGNOSIS — M5136 Other intervertebral disc degeneration, lumbar region: Secondary | ICD-10-CM | POA: Diagnosis not present

## 2018-05-15 DIAGNOSIS — M4316 Spondylolisthesis, lumbar region: Secondary | ICD-10-CM | POA: Diagnosis not present

## 2018-05-15 DIAGNOSIS — Z6831 Body mass index (BMI) 31.0-31.9, adult: Secondary | ICD-10-CM | POA: Diagnosis not present

## 2018-05-17 ENCOUNTER — Ambulatory Visit: Payer: Medicare Other | Admitting: Orthotics

## 2018-05-17 DIAGNOSIS — M79672 Pain in left foot: Secondary | ICD-10-CM

## 2018-05-17 DIAGNOSIS — Q6671 Congenital pes cavus, right foot: Secondary | ICD-10-CM

## 2018-05-17 DIAGNOSIS — M779 Enthesopathy, unspecified: Secondary | ICD-10-CM

## 2018-05-17 NOTE — Progress Notes (Signed)
Added arch reinforcement to RIGHT f/o as I had done previulsy with the LEFT

## 2018-05-24 ENCOUNTER — Encounter: Payer: Self-pay | Admitting: Internal Medicine

## 2018-05-25 ENCOUNTER — Other Ambulatory Visit: Payer: Self-pay | Admitting: Internal Medicine

## 2018-05-25 DIAGNOSIS — A09 Infectious gastroenteritis and colitis, unspecified: Secondary | ICD-10-CM

## 2018-05-25 MED ORDER — CIPROFLOXACIN HCL 500 MG PO TABS: 500.0000 mg | ORAL_TABLET | Freq: Two times a day (BID) | ORAL | 1 refills | Status: DC

## 2018-06-12 ENCOUNTER — Ambulatory Visit: Payer: Medicare Other

## 2018-06-12 ENCOUNTER — Encounter: Payer: Self-pay | Admitting: Internal Medicine

## 2018-06-13 ENCOUNTER — Telehealth: Payer: Self-pay | Admitting: *Deleted

## 2018-06-13 NOTE — Telephone Encounter (Signed)
Patient notified and agreed. Rescheduled appointment.

## 2018-06-13 NOTE — Telephone Encounter (Signed)
Patient called and stated that She traveled to [place] and just got back on Monday. Stated that she self Quarantine herself to her home since getting back. Stated that she is not showing any signs or symptoms of sickness. No Fever, No cough, No Diarrhea, No Pains, No ST, No SOB.  Patient is suppose to get her Vitamin B12 and Evenity shots on Friday and wonders if she should reschedule these and how long she should wait. Please Advise.

## 2018-06-13 NOTE — Telephone Encounter (Signed)
Per my response on mychart, she should reschedule due to her recent travel until 14 days from her return from [place].

## 2018-06-15 ENCOUNTER — Encounter: Payer: Medicare Other | Admitting: Family

## 2018-06-15 ENCOUNTER — Ambulatory Visit: Payer: Self-pay

## 2018-06-15 ENCOUNTER — Ambulatory Visit: Payer: Medicare Other

## 2018-07-10 ENCOUNTER — Other Ambulatory Visit: Payer: Self-pay

## 2018-07-10 ENCOUNTER — Ambulatory Visit (INDEPENDENT_AMBULATORY_CARE_PROVIDER_SITE_OTHER): Payer: Medicare Other

## 2018-07-10 ENCOUNTER — Ambulatory Visit (INDEPENDENT_AMBULATORY_CARE_PROVIDER_SITE_OTHER): Payer: Medicare Other | Admitting: Adult Health

## 2018-07-10 DIAGNOSIS — M810 Age-related osteoporosis without current pathological fracture: Secondary | ICD-10-CM

## 2018-07-10 DIAGNOSIS — E538 Deficiency of other specified B group vitamins: Secondary | ICD-10-CM | POA: Diagnosis not present

## 2018-07-10 DIAGNOSIS — Z0000 Encounter for general adult medical examination without abnormal findings: Secondary | ICD-10-CM | POA: Diagnosis not present

## 2018-07-10 MED ORDER — CYANOCOBALAMIN 1000 MCG/ML IJ SOLN
1000.0000 ug | Freq: Once | INTRAMUSCULAR | Status: AC
  Administered 2018-07-10: 1000 ug via INTRAMUSCULAR

## 2018-07-10 MED ORDER — ROMOSOZUMAB-AQQG 105 MG/1.17ML ~~LOC~~ SOSY
210.0000 mg | PREFILLED_SYRINGE | Freq: Once | SUBCUTANEOUS | Status: AC
  Administered 2018-07-10: 210 mg via SUBCUTANEOUS

## 2018-07-10 MED ORDER — ROMOSOZUMAB-AQQG 105 MG/1.17ML ~~LOC~~ SOSY: 210.0000 mg | PREFILLED_SYRINGE | Freq: Once | SUBCUTANEOUS | Status: DC

## 2018-07-10 NOTE — Patient Instructions (Addendum)
Jody [name] , Thank you for taking time to come for your Medicare Wellness Visit. I appreciate your ongoing commitment to your health goals. Please review the following plan we discussed and let me know if I can assist you in the future.   Screening recommendations/referrals: Colonoscopy: aged-out Recommended yearly ophthalmology/optometry visit for glaucoma screening and checkup Recommended yearly dental visit for  Preventive Care 69 Years and Older, Female Preventive care refers to lifestyle choices and visits with your health care provider that can promote health and wellness. What does preventive care include?  A yearly physical exam. This is also called an annual well check.  Dental exams once or twice a year.  Routine eye exams. Ask your health care provider how often you should have your eyes checked.  Personal lifestyle choices, including: ? Daily care of your teeth and gums. ? Regular physical activity. ? Eating a healthy diet. ? Avoiding tobacco and drug use. ? Limiting alcohol use. ? Practicing safe sex. ? Taking low-dose aspirin every day. ? Taking vitamin and mineral supplements as recommended by your health care provider. What happens during an annual well check? The services and screenings done by your health care provider during your annual well check will depend on your age, overall health, lifestyle risk factors, and family history of disease. Counseling Your health care provider may ask you questions about your:  Alcohol use.  Tobacco use.  Drug use.  Emotional well-being.  Home and relationship well-being.  Sexual activity.  Eating habits.  History of falls.  Memory and ability to understand (cognition).  Work and work Statistician.  Reproductive health.  Screening You may have the following tests or measurements:  Height, weight, and BMI.  Blood pressure.  Lipid and cholesterol levels. These may be checked every 5 years, or more frequently  if you are over 14 years old.  Skin check.  Lung cancer screening. You may have this screening every year starting at age 6 if you have a 30-pack-year history of smoking and currently smoke or have quit within the past 15 years.  Colorectal cancer screening. All adults should have this screening starting at age 40 and continuing until age 36. You will have tests every 1-10 years, depending on your results and the type of screening test. People at increased risk should start screening at an earlier age. Screening tests may include: ? Guaiac-based fecal occult blood testing. ? Fecal immunochemical test (FIT). ? Stool DNA test. ? Virtual colonoscopy. ? Sigmoidoscopy. During this test, a flexible tube with a tiny camera (sigmoidoscope) is used to examine your rectum and lower colon. The sigmoidoscope is inserted through your anus into your rectum and lower colon. ? Colonoscopy. During this test, a long, thin, flexible tube with a tiny camera (colonoscope) is used to examine your entire colon and rectum.  Hepatitis C blood test.  Hepatitis B blood test.  Sexually transmitted disease (STD) testing.  Diabetes screening. This is done by checking your blood sugar (glucose) after you have not eaten for a while (fasting). You may have this done every 1-3 years.  Bone density scan. This is done to screen for osteoporosis. You may have this done starting at age 75.  Mammogram. This may be done every 1-2 years. Talk to your health care provider about how often you should have regular mammograms. Talk with your health care provider about your test results, treatment options, and if necessary, the need for more tests. Vaccines Your health care provider may recommend certain  vaccines, such as:  Influenza vaccine. This is recommended every year.  Tetanus, diphtheria, and acellular pertussis (Tdap, Td) vaccine. You may need a Td booster every 10 years.  Varicella vaccine. You may need this if you have  not been vaccinated.  Zoster vaccine. You may need this after age 36.  Measles, mumps, and rubella (MMR) vaccine. You may need at least one dose of MMR if you were born in 1957 or later. You may also need a second dose.  Pneumococcal 13-valent conjugate (PCV13) vaccine. One dose is recommended after age 19.  Pneumococcal polysaccharide (PPSV23) vaccine. One dose is recommended after age 41.  Meningococcal vaccine. You may need this if you have certain conditions.  Hepatitis A vaccine. You may need this if you have certain conditions or if you travel or work in places where you may be exposed to hepatitis A.  Hepatitis B vaccine. You may need this if you have certain conditions or if you travel or work in places where you may be exposed to hepatitis B.  Haemophilus influenzae type b (Hib) vaccine. You may need this if you have certain conditions. Talk to your health care provider about which screenings and vaccines you need and how often you need them. This information is not intended to replace advice given to you by your health care provider. Make sure you discuss any questions you have with your health care provider. Document Released: 04/10/2015 Document Revised: 05/04/2017 Document Reviewed: 01/13/2015 Elsevier Interactive Patient Education  2019 [place]. hygiene and checkup  Vaccinations: Influenza vaccine:  02/12/18 Pneumococcal vaccine : 11/14/14 Tdap vaccine 09/04/2008 Shingles vaccine 06/05/17  Advanced directives:  Full Code  Conditions/risks identified: None  Next appointment: 08/06/2018

## 2018-07-10 NOTE — Addendum Note (Signed)
Addended by: Logan Bores on: 07/10/2018 04:25 PM   Modules accepted: Orders

## 2018-07-10 NOTE — Addendum Note (Signed)
Addended by: Ruthell Rummage A on: 07/10/2018 11:20 AM   Modules accepted: Orders

## 2018-07-10 NOTE — Progress Notes (Addendum)
This service is provided via telemedicine  No vital signs collected/recorded due to the encounter was a telemedicine visit.   Location of patient (ex: home, work):  home  Patient consents to a telephone visit:  Yes  Location of the provider (ex: office, home): Office  Name of any referring provider:  Dr. Hollace Kinnier  Names of all persons participating in the telemedicine service and their role in the encounter:  Ruthell Rummage CMA, Durenda Age NP, Patrcia Dolly  Time spent on call:  Ruthell Rummage CMA spent  6  Minutes on phone with patient      Subjective:   Jody [name] is a 78 y.o. female who presents for Medicare Annual (Subsequent) preventive examination. The patient gave consent to this telephone visit. Explained to the patient the risk and privacy issue that was involved with this telephone call.   Review of Systems:     GENERAL: no change in appetite, no fatigue, no weight changes, no fever, chills or weakness MOUTH and THROAT: Denies oral discomfort, gingival pain or bleeding, pain from teeth or hoarseness   RESPIRATORY: no cough, SOB, DOE, wheezing, hemoptysis CARDIAC: no chest pain, edema or palpitations GI: no abdominal pain, diarrhea, constipation, heart burn, nausea or vomiting GU: Denies dysuria, frequency, hematuria, incontinence, or discharge MUSCULOSKELETAL: + joint pain and back pain NEUROLOGICAL: Denies dizziness, syncope, numbness, or headache PSYCHIATRIC: Denies feeling of depression or anxiety. No report of hallucinations, insomnia, paranoia, or agitation        Objective:     Vitals: There were no vitals taken for this visit.  There is no height or weight on file to calculate BMI.  Advanced Directives 08/06/2018 07/10/2018 03/24/2018 02/12/2018 07/17/2017 06/12/2017 02/13/2017  Does Patient Have a Medical Advance Directive? Yes Yes No Yes Yes Yes Yes  Type of Advance Directive Healthcare Power of [place]  Does patient want to make changes to medical advance directive? No - Patient declined No - Patient declined - No - Patient declined No - Patient declined No - Patient declined No - Patient declined  Copy of [place] in Chart? Yes - validated most recent copy scanned in chart (See row information) Yes - validated most recent copy scanned in chart (See row information) - Yes - validated most recent copy scanned in chart (See row information) Yes Yes Yes  Would patient like information on creating a medical advance directive? No - Patient declined - - - - - -    Tobacco Social History   Tobacco Use  Smoking Status Former Smoker  . Last attempt to quit: 05/22/1975  . Years since quitting: 43.2  Smokeless Tobacco Never Used  Tobacco Comment   Quit in early 85's     Counseling given: Not Answered Comment: Quit in early 30's   Clinical Intake:  Pre-visit preparation completed: Yes  Pain : 0-10 Pain Score: 5  Pain Type: Chronic pain Pain Location: Back Pain Orientation: Lower Pain Descriptors / Indicators: Constant Pain Frequency: Constant Pain Relieving Factors: Gabapentin and Tylenol Effect of Pain on Daily Activities: can only do somethings at a time  Pain Relieving Factors: Gabapentin and Tylenol  Diabetes: No  How often do you need to have someone help you when you read instructions, pamphlets, or other written materials from your doctor or pharmacy?: 1 - Never What is the last grade level  you completed in school?:  2 masters degree - MBA and counseling  Interpreter Needed?: No     Past Medical History:  Diagnosis Date  . Anemia, unspecified   . B12 deficiency due to diet   . Cramp of limb   . Edema of both legs   . Encounter for long-term (current) use of other medications   . Lumbago   . Obesity   . Osteoarthrosis, unspecified  whether generalized or localized, unspecified site   . Restless legs syndrome (RLS)   . Senile osteoporosis    Past Surgical History:  Procedure Laterality Date  . CATARACT EXTRACTION W/ INTRAOCULAR LENS  IMPLANT, BILATERAL  04/2014  . 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 [place]  2005  . [place]   stapleing   . VITRECTOMY Right    with membrane peeling for a macular pucker   Family History  Problem Relation Age of Onset  . Cancer Sister   . Cancer Brother    Social History   Socioeconomic History  . Marital status: Married    Spouse name: Not on file  . Number of children: Not on file  . Years of education: Not on file  . Highest education level: Not on file  Occupational History  . Occupation: retired Administrator   Social Needs  . Financial resource strain: Not hard at all  . Food insecurity:    Worry: Never true    Inability: Never true  . Transportation needs:    Medical: No    Non-medical: No  Tobacco Use  . Smoking status: Former Smoker    Last attempt to quit: 05/22/1975    Years since quitting: 43.2  . Smokeless tobacco: Never Used  . Tobacco comment: Quit in early 30's  Substance and Sexual Activity  . Alcohol use: No    Alcohol/week: 0.0 standard drinks  . Drug use: No  . Sexual activity: Never  Lifestyle  . Physical activity:    Days per week: 4 days    Minutes per session: 60 min  . Stress: Only a little  Relationships  . Social connections:    Talks on phone: More than three times a week    Gets together: More than three times a week    Attends religious service: 1 to 4 times per year    Active member of club or organization: No    Attends meetings of clubs or organizations: Never    Relationship status: Married  Other Topics Concern  . Not on file  Social History Narrative   Married -Elenore Rota married 1966   Former Smoker stopped 1977   Alcohol none   Exercise -Deep water 4 times a week for one hour   POA     Outpatient Encounter Medications as of 07/10/2018  Medication Sig  . celecoxib (CELEBREX) 100 MG capsule Take 1 capsule (100 mg total) by mouth 2 (two) times daily.  . Cholecalciferol (VITAMIN D) 2000 UNITS tablet Take 2,000 Units by mouth daily.  . Cyanocobalamin (B-12 COMPLIANCE INJECTION IJ) Inject 1 mL as directed every 30 (thirty) days.  [name] gabapentin (NEURONTIN) 300 MG capsule Take 300 mg by mouth 3 (three) times daily.  . Iron 66 MG TABS Take 1 tablet by mouth daily.   [name] lisinopril (PRINIVIL,ZESTRIL) 5 MG tablet Take 1 tablet (5 mg total) by mouth daily. Take 1 additional tablet by mouth if your blood pressure is 140/90 in the afternoon  .  Multiple Vitamin (MULTIVITAMIN) tablet Take 1 tablet by mouth daily.  . Romosozumab-aqqg (EVENITY) 105 MG/1.17ML SOSY injection Inject 210 mg into the skin every 30 (thirty) days.  . [DISCONTINUED] acetaminophen (TYLENOL) 325 MG tablet Take 650 mg by mouth every 6 (six) hours as needed.  . [DISCONTINUED] Calcium Carb-Cholecalciferol (CALCIUM + D3) 600-200 MG-UNIT TABS Take one tablet three times daily  . [DISCONTINUED] ciprofloxacin (CIPRO) 500 MG tablet Take 1 tablet (500 mg total) by mouth 2 (two) times daily. If needed for travelers' diarrhea  . [DISCONTINUED] Romosozumab-aqqg (EVENITY) 105 MG/1.17ML injection 210 mg    No facility-administered encounter medications on file as of 07/10/2018.     Activities of Daily Living No flowsheet data found.  Patient Care Team: Gayland Curry, DO as PCP - General (Geriatric Medicine) Marica Otter, [place] as Consulting Physician (Optometry) Regal, Tamala Fothergill, DPM as Consulting Physician (Podiatry) Debara Pickett Nadean Corwin, MD as Consulting Physician (Cardiology)    Assessment:   This is a routine wellness examination for [facility].  Exercise Activities and Dietary recommendations   She exercises daily (swimming) but due to the COVID 19 quarantine, she does her exercises occasionally at home.   Goals    .  Increase physical activity     Starting 05/20/16, I will attempt to increase my physical activity.         Fall Risk Fall Risk  07/10/2018 03/29/2018 02/12/2018 07/17/2017 06/12/2017  Falls in the past year? 0 0 0 No No  Number falls in past yr: 0 0 0 - -  Comment - - - - -  Injury with Fall? 0 0 0 - -  Comment - - - - -  Risk for fall due to : - - - - -  Risk for fall due to: Comment - - - - -  Follow up - - - - -   Is the patient's home free of loose throw rugs in walkways, pet beds, electrical cords, etc?   yes      Grab bars in the bathroom? yes      Handrails on the stairs?   yes      Adequate lighting?   yes  Timed Get Up and Go performed: Not done  Depression Screen PHQ 2/9 Scores 07/10/2018 03/29/2018 07/17/2017 06/12/2017  PHQ - 2 Score 0 0 0 0     Cognitive Function MMSE - Mini Mental State Exam 06/12/2017 05/20/2016 05/22/2015  Orientation to time 5 5 5   Orientation to Place 5 5 5   Registration 3 3 3   Attention/ Calculation 5 5 5   Recall 2 3 3   Language- name 2 objects 2 2 2   Language- repeat 1 1 1   Language- follow 3 step command 3 3 3   Language- read & follow direction 1 1 1   Write a sentence 1 1 1   Copy design 1 1 1   Total score 29 30 30      6CIT Screen 07/10/2018  What Year? 0 points  What month? 0 points  What time? 0 points  Count back from 20 0 points  Months in reverse 0 points  Repeat phrase 0 points  Total Score 0    Immunization History  Administered Date(s) Administered  . Hepatitis A 11/13/1997, 06/07/2005  . Hepatitis B 09/11/1997, 10/09/1997, 03/26/1998  . IPV 09/11/1997  . Influenza, High Dose Seasonal PF 01/24/2017, 02/12/2018  . Influenza,inj,Quad PF,6+ Mos 01/28/2013, 05/16/2014, 11/19/2015  . Influenza-Unspecified 04/09/1998  . Pneumococcal Conjugate-13 11/14/2014  . Pneumococcal Polysaccharide-23 03/28/2008  .  Pneumococcal-Unspecified 03/28/2004  . Td 10/09/1997, 09/04/2008  . Typhoid Parenteral 11/13/1997, 06/07/2005  . Zoster  05/27/2007  . Zoster Recombinat (Shingrix) 04/04/2017, 06/05/2017    Qualifies for Shingles Vaccine? No  Screening Tests Health Maintenance  Topic Date Due  . MAMMOGRAM  06/28/2018  . TETANUS/TDAP  09/05/2018  . INFLUENZA VACCINE  10/27/2018  . DEXA SCAN  Completed  . PNA vac Low Risk Adult  Completed    Cancer Screenings: Lung: Low Dose CT Chest recommended if Age 71-80 years, 30 pack-year currently smoking OR have quit w/in 15years. Patient does not qualify. Breast:  Up to date on Mammogram? Yes   Up to date of Bone Density/Dexa? Yes Colorectal: Yes (patient reported)  Additional Screenings: No Hepatitis C Screening:      Plan:    I have personally reviewed and noted the following in the patient's chart:   . Medical and social history . Use of alcohol, tobacco or illicit drugs  . Current medications and supplements . Functional ability and status . Nutritional status . Physical activity . Advanced directives . List of other physicians . Hospitalizations, surgeries, and ER visits in previous 12 months . Vitals . Screenings to include cognitive, depression, and falls . Referrals and appointments  In addition, I have reviewed and discussed with patient certain preventive protocols, quality metrics, and best practice recommendations. A written personalized care plan for preventive services as well as general preventive health recommendations were provided to patient.   Time spent on non face to face visit:  21 minutes  Andrell Bergeson Medina-Vargas, NP  08/10/2018

## 2018-07-17 DIAGNOSIS — H2703 Aphakia, bilateral: Secondary | ICD-10-CM | POA: Diagnosis not present

## 2018-07-17 DIAGNOSIS — H04123 Dry eye syndrome of bilateral lacrimal glands: Secondary | ICD-10-CM | POA: Diagnosis not present

## 2018-07-17 DIAGNOSIS — H52223 Regular astigmatism, bilateral: Secondary | ICD-10-CM | POA: Diagnosis not present

## 2018-07-17 DIAGNOSIS — H35373 Puckering of macula, bilateral: Secondary | ICD-10-CM | POA: Diagnosis not present

## 2018-07-17 DIAGNOSIS — H5203 Hypermetropia, bilateral: Secondary | ICD-10-CM | POA: Diagnosis not present

## 2018-07-17 DIAGNOSIS — Z961 Presence of intraocular lens: Secondary | ICD-10-CM | POA: Diagnosis not present

## 2018-07-17 DIAGNOSIS — H524 Presbyopia: Secondary | ICD-10-CM | POA: Diagnosis not present

## 2018-08-01 ENCOUNTER — Other Ambulatory Visit: Payer: Medicare Other

## 2018-08-01 ENCOUNTER — Other Ambulatory Visit: Payer: Self-pay

## 2018-08-01 DIAGNOSIS — E538 Deficiency of other specified B group vitamins: Secondary | ICD-10-CM | POA: Diagnosis not present

## 2018-08-01 DIAGNOSIS — I10 Essential (primary) hypertension: Secondary | ICD-10-CM

## 2018-08-01 DIAGNOSIS — E66811 Obesity, class 1: Secondary | ICD-10-CM

## 2018-08-01 DIAGNOSIS — Z6832 Body mass index (BMI) 32.0-32.9, adult: Secondary | ICD-10-CM | POA: Diagnosis not present

## 2018-08-01 DIAGNOSIS — E669 Obesity, unspecified: Secondary | ICD-10-CM

## 2018-08-02 LAB — COMPLETE METABOLIC PANEL WITH GFR
AG Ratio: 1.6 (calc) (ref 1.0–2.5)
ALT: 16 U/L (ref 6–29)
AST: 30 U/L (ref 10–35)
Albumin: 4.2 g/dL (ref 3.6–5.1)
Alkaline phosphatase (APISO): 57 U/L (ref 37–153)
BUN/Creatinine Ratio: 26 (calc) — ABNORMAL HIGH (ref 6–22)
BUN: 24 mg/dL (ref 7–25)
CO2: 27 mmol/L (ref 20–32)
Calcium: 9.5 mg/dL (ref 8.6–10.4)
Chloride: 105 mmol/L (ref 98–110)
Creat: 0.94 mg/dL — ABNORMAL HIGH (ref 0.60–0.88)
GFR, Est African American: 66 mL/min/{1.73_m2} (ref >=60)
GFR, Est Non African American: 57 mL/min/{1.73_m2} — ABNORMAL LOW (ref >=60)
Globulin: 2.6 g/dL (calc) (ref 1.9–3.7)
Glucose, Bld: 91 mg/dL (ref 65–99)
Potassium: 4.4 mmol/L (ref 3.5–5.3)
Sodium: 138 mmol/L (ref 135–146)
Total Bilirubin: 0.5 mg/dL (ref 0.2–1.2)
Total Protein: 6.8 g/dL (ref 6.1–8.1)

## 2018-08-02 LAB — CBC WITH DIFFERENTIAL/PLATELET
Absolute Monocytes: 598 cells/uL (ref 200–950)
Basophils Absolute: 33 cells/uL (ref 0–200)
Basophils Relative: 0.5 %
Eosinophils Absolute: 332 cells/uL (ref 15–500)
Eosinophils Relative: 5.1 %
HCT: 33.7 % — ABNORMAL LOW (ref 35.0–45.0)
Hemoglobin: 11 g/dL — ABNORMAL LOW (ref 11.7–15.5)
Lymphs Abs: 2022 cells/uL (ref 850–3900)
MCH: 30.2 pg (ref 27.0–33.0)
MCHC: 32.6 g/dL (ref 32.0–36.0)
MCV: 92.6 fL (ref 80.0–100.0)
MPV: 9.8 fL (ref 7.5–12.5)
Monocytes Relative: 9.2 %
Neutro Abs: 3517 cells/uL (ref 1500–7800)
Neutrophils Relative %: 54.1 %
Platelets: 272 10*3/uL (ref 140–400)
RBC: 3.64 10*6/uL — ABNORMAL LOW (ref 3.80–5.10)
RDW: 11.8 % (ref 11.0–15.0)
Total Lymphocyte: 31.1 %
WBC: 6.5 10*3/uL (ref 3.8–10.8)

## 2018-08-02 LAB — LIPID PANEL
Cholesterol: 171 mg/dL (ref <200)
HDL: 58 mg/dL (ref >=50)
LDL Cholesterol (Calc): 90 mg/dL (calc)
Non-HDL Cholesterol (Calc): 113 mg/dL (calc) (ref <130)
Total CHOL/HDL Ratio: 2.9 (calc) (ref <5.0)
Triglycerides: 130 mg/dL (ref <150)

## 2018-08-02 LAB — VITAMIN B12: Vitamin B-12: 703 pg/mL (ref 200–1100)

## 2018-08-06 ENCOUNTER — Other Ambulatory Visit: Payer: Self-pay

## 2018-08-06 ENCOUNTER — Encounter: Payer: Self-pay | Admitting: Internal Medicine

## 2018-08-06 ENCOUNTER — Ambulatory Visit (INDEPENDENT_AMBULATORY_CARE_PROVIDER_SITE_OTHER): Payer: Medicare Other | Admitting: Internal Medicine

## 2018-08-06 DIAGNOSIS — M150 Primary generalized (osteo)arthritis: Secondary | ICD-10-CM

## 2018-08-06 DIAGNOSIS — M810 Age-related osteoporosis without current pathological fracture: Secondary | ICD-10-CM

## 2018-08-06 DIAGNOSIS — M5416 Radiculopathy, lumbar region: Secondary | ICD-10-CM | POA: Diagnosis not present

## 2018-08-06 DIAGNOSIS — E538 Deficiency of other specified B group vitamins: Secondary | ICD-10-CM

## 2018-08-06 DIAGNOSIS — I10 Essential (primary) hypertension: Secondary | ICD-10-CM | POA: Diagnosis not present

## 2018-08-06 DIAGNOSIS — M8949 Other hypertrophic osteoarthropathy, multiple sites: Secondary | ICD-10-CM

## 2018-08-06 DIAGNOSIS — M159 Polyosteoarthritis, unspecified: Secondary | ICD-10-CM

## 2018-08-06 NOTE — Patient Instructions (Signed)
It was great talking with you today!  Let's plan to meet again after you get your bone density test done next month.  Give me a call for an appointment at that time.  In the interim, do your best to stay active in and around your home to help keep those joints from stiffening up and hurting more.    Keep track of that blood pressure.  Call me if you're getting up into the 150s-160s despite your lisinopril.  We could adjust the dose temporarily to accommodate for your inability to do water aerobics/swimming.

## 2018-08-06 NOTE — Progress Notes (Signed)
Patient ID: Jody [name], female   DOB: 1940-07-09, 78 y.o.   MRN: [id number] This service is provided via telemedicine  No vital signs collected/recorded due to the encounter was a telemedicine visit.   Location of patient (ex: home, work):  HOME  Patient consents to a telephone visit:  YES  Location of the provider (ex: office, home):  OFFICE  Name of any referring provider:  Sharene Krikorian, DO  Names of all persons participating in the telemedicine service and their role in the encounter:  PATIENT, [place], Irene Mitcham DO  Time spent on call:  5:16    Provider:  Faylene Allerton L. Mariea Clonts, D.O., C.M.D.  Code Status: DNR Goals of Care:  Advanced Directives 08/06/2018  Does Patient Have a Medical Advance Directive? Yes  Type of Advance Directive [place]  Does patient want to make changes to medical advance directive? No - Patient declined  Copy of [place] in Chart? Yes - validated most recent copy scanned in chart (See row information)  Would patient like information on creating a medical advance directive? No - Patient declined     Chief Complaint  Patient presents with  . Medical Management of Chronic Issues    6MTH FOLLOW-UP    HPI: Patient is a 78 y.o. female seen today via virtual video visit for medical management of chronic diseases.    She hurts all of the time.  Her primary exercise for flexibility and mobility is in the water.  She had traveled end of [place] and early feb and then social isolation  BP is running a little higher.  Usually the lisinopril gives her normal range bp.  A mix of not getting the exercise and being in pain.  BP may be 158, no higher than 160.  Has a wrist cuff.  She calibrates with office bp readings and usually on target.  Machine reads high if over 134 or 85.  She had only been taking lisinopril if bp over 140, but she's having to take daily now.  That has made the readings normal at night.    She  does some walking up and down the driveway.  Does other things to get her heart rate each hour.  She has her fitbit set up that way.  When she's tried to walk outside, she can go 15 mins to where she has to stop.  When she's swimming regularly, she can go a lot longer than 15 mins.  She walked quite a bit when traveling.  She did short trails and things in march.  Wants to keep her celebrex written bid but she takes just one daily.  Takes the tylenol pm if she's hurting to where she cannot sleep.  She sleeps solid with it and gabapentin to 8 h.  She wakes up numerous times and does not get into deep sleep w/o that combination.    B12 requires injections.   LDL is 90.  Cholesterol overall not bad.    Bone density has been postponed due to covid.  It was meant to be feb, then may and will be 6/9.  She will be on evenity almost a year.  Last bone density was 4/20.  Started evenity in 7/19.  The gabapentin has helped her leg cramps, too.  Also takes her iron supplement.   Past Medical History:  Diagnosis Date  . Anemia, unspecified   . B12 deficiency due to diet   . Cramp of limb   .  Edema of both legs   . Encounter for long-term (current) use of other medications   . Lumbago   . Obesity   . Osteoarthrosis, unspecified whether generalized or localized, unspecified site   . Restless legs syndrome (RLS)   . Senile osteoporosis     Past Surgical History:  Procedure Laterality Date  . CATARACT EXTRACTION W/ INTRAOCULAR LENS  IMPLANT, BILATERAL  04/2014  . 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 [place]  2005  . [place]   stapleing   . VITRECTOMY Right    with membrane peeling for a macular pucker    Allergies  Allergen Reactions  . Prolia [Denosumab] Other (See Comments)    Chest pain  . Percocet [Oxycodone-Acetaminophen]     Confusion   . Shrimp [Shellfish Allergy]     All seafood    Outpatient Encounter Medications as of 08/06/2018  Medication Sig  .  Calcium Carbonate-Vit D-Min (CALCIUM 1200 PO) Take 1 tablet by mouth daily.  . celecoxib (CELEBREX) 100 MG capsule Take 1 capsule (100 mg total) by mouth 2 (two) times daily.  . Cholecalciferol (VITAMIN D) 2000 UNITS tablet Take 2,000 Units by mouth daily.  . Cyanocobalamin (B-12 COMPLIANCE INJECTION IJ) Inject 1 mL as directed every 30 (thirty) days.  . diphenhydramine-acetaminophen (TYLENOL PM) 25-500 MG TABS tablet Take 1 tablet by mouth at bedtime as needed.  . gabapentin (NEURONTIN) 300 MG capsule Take 300 mg by mouth 3 (three) times daily.  . Iron 66 MG TABS Take 1 tablet by mouth daily.   [name] lisinopril (PRINIVIL,ZESTRIL) 5 MG tablet Take 1 tablet (5 mg total) by mouth daily. Take 1 additional tablet by mouth if your blood pressure is 140/90 in the afternoon  . Multiple Vitamin (MULTIVITAMIN) tablet Take 1 tablet by mouth daily.  . Romosozumab-aqqg (EVENITY) 105 MG/1.17ML SOSY injection Inject 210 mg into the skin every 30 (thirty) days.  . [DISCONTINUED] acetaminophen (TYLENOL) 325 MG tablet Take 650 mg by mouth every 6 (six) hours as needed.  . [DISCONTINUED] Calcium Carb-Cholecalciferol (CALCIUM + D3) 600-200 MG-UNIT TABS Take one tablet three times daily   No facility-administered encounter medications on file as of 08/06/2018.     Review of Systems:  Review of Systems  Constitutional: Negative for chills, fever and malaise/fatigue.  HENT: Negative for congestion.   Eyes: Negative for blurred vision.  Respiratory: Negative for cough and shortness of breath.   Cardiovascular: Negative for chest pain, palpitations and leg swelling.  Gastrointestinal: Negative for abdominal pain, blood in stool, constipation and melena.  Genitourinary: Negative for dysuria.  Musculoskeletal: Positive for back pain and joint pain. Negative for falls.       Muscle cramps better  Skin: Negative for rash.  Neurological: Positive for tingling and sensory change. Negative for dizziness and loss of  consciousness.  Endo/Heme/Allergies: Bruises/bleeds easily.  Psychiatric/Behavioral: Negative for depression and memory loss. The patient has insomnia. The patient is not nervous/anxious.        Difficulty sleeping due to pain if she does not take tylenol and gabapentin at hs    Health Maintenance  Topic Date Due  . MAMMOGRAM  06/28/2018  . TETANUS/TDAP  09/05/2018  . INFLUENZA VACCINE  10/27/2018  . DEXA SCAN  Completed  . PNA vac Low Risk Adult  Completed    Physical Exam: Could not be performed as visit non face-to-face via phone   Labs reviewed: Basic Metabolic Panel: Recent Labs    08/17/17  7829 08/01/18 0804  NA 141 138  K 4.3 4.4  CL 107 105  CO2 27 27  GLUCOSE 87 91  BUN 30* 24  CREATININE 1.00* 0.94*  CALCIUM 9.8 9.5   Liver Function Tests: Recent Labs    08/17/17 0835 08/01/18 0804  AST 30 30  ALT 18 16  BILITOT 0.5 0.5  PROT 7.0 6.8   No results for input(s): LIPASE, AMYLASE in the last 8760 hours. No results for input(s): AMMONIA in the last 8760 hours. CBC: Recent Labs    08/17/17 0835 08/01/18 0804  WBC 5.5 6.5  NEUTROABS 3,086 3,517  HGB 11.2* 11.0*  HCT 33.3* 33.7*  MCV 89.8 92.6  PLT 277 272   Lipid Panel: Recent Labs    08/17/17 0835 08/01/18 0804  CHOL 185 171  HDL 64 58  LDLCALC 102* 90  TRIG 95 130  CHOLHDL 2.9 2.9   Lab Results  Component Value Date   HGBA1C 5.6 03/04/2013    Assessment/Plan 1. Senile osteoporosis -continues on evenity--first injection was in July of 2019--did miss some due to travel -has f/u bone density in June and will call me to f/u after that -we need to determine what medication she'll go on indefinitely to maintain her bone density after her year of evenity is completed  2. B12 deficiency due to diet -cont b12 injections to maintain level due to her pernicious anemia  3. 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 Primary osteoarthritis involving multiple joints -as in #3--worse now due to being unable to use pool amid covid  5. Essential hypertension -bps running higher with less activity, celebrex use, and increased pain  Labs/tests ordered:  No new at this time Next appt:  08/10/2018 Non face-to-face time spent on video visit:  26 minutes  Yonis Carreon L. Tayvian Holycross, D.O. [facility] [address], [place] [address] Cell Phone (Mon-Fri 8am-5pm):  [phone] On Call:  [phone] & follow prompts after 5pm & weekends Office Phone:  [phone] Office Fax:  [phone]

## 2018-08-10 ENCOUNTER — Ambulatory Visit: Payer: Medicare Other

## 2018-08-13 ENCOUNTER — Ambulatory Visit (INDEPENDENT_AMBULATORY_CARE_PROVIDER_SITE_OTHER): Payer: Medicare Other

## 2018-08-13 ENCOUNTER — Other Ambulatory Visit: Payer: Self-pay

## 2018-08-13 DIAGNOSIS — E538 Deficiency of other specified B group vitamins: Secondary | ICD-10-CM

## 2018-08-13 DIAGNOSIS — M810 Age-related osteoporosis without current pathological fracture: Secondary | ICD-10-CM

## 2018-08-13 MED ORDER — ROMOSOZUMAB-AQQG 105 MG/1.17ML ~~LOC~~ SOSY
210.0000 mg | PREFILLED_SYRINGE | Freq: Once | SUBCUTANEOUS | Status: AC
  Administered 2018-08-13: 10:00:00 210 mg via SUBCUTANEOUS

## 2018-08-13 MED ORDER — CYANOCOBALAMIN 1000 MCG/ML IJ SOLN
1000.0000 ug | Freq: Once | INTRAMUSCULAR | Status: AC
  Administered 2018-08-13: 1000 ug via INTRAMUSCULAR

## 2018-08-17 ENCOUNTER — Telehealth: Payer: Self-pay | Admitting: Podiatry

## 2018-08-17 DIAGNOSIS — M5116 Intervertebral disc disorders with radiculopathy, lumbar region: Secondary | ICD-10-CM | POA: Diagnosis not present

## 2018-08-17 DIAGNOSIS — M4126 Other idiopathic scoliosis, lumbar region: Secondary | ICD-10-CM | POA: Diagnosis not present

## 2018-08-17 DIAGNOSIS — M5136 Other intervertebral disc degeneration, lumbar region: Secondary | ICD-10-CM | POA: Diagnosis not present

## 2018-08-17 DIAGNOSIS — M47816 Spondylosis without myelopathy or radiculopathy, lumbar region: Secondary | ICD-10-CM | POA: Diagnosis not present

## 2018-08-17 NOTE — Telephone Encounter (Signed)
Pt left message asking for a call back to schedule an appt with Liliane Channel reqarding her orthotics she got last yr.She has some questions for [facility].  Left message for pt to call and she returned call and is scheduled to see Rick 6.1.2020

## 2018-08-27 ENCOUNTER — Other Ambulatory Visit: Payer: Self-pay

## 2018-08-27 ENCOUNTER — Ambulatory Visit: Payer: Medicare Other | Admitting: Orthotics

## 2018-08-27 DIAGNOSIS — Q6671 Congenital pes cavus, right foot: Secondary | ICD-10-CM

## 2018-08-27 DIAGNOSIS — M79672 Pain in left foot: Secondary | ICD-10-CM

## 2018-08-27 DIAGNOSIS — M779 Enthesopathy, unspecified: Secondary | ICD-10-CM

## 2018-08-27 NOTE — Progress Notes (Signed)
Added valgus wedges

## 2018-09-17 ENCOUNTER — Ambulatory Visit (INDEPENDENT_AMBULATORY_CARE_PROVIDER_SITE_OTHER): Payer: Medicare Other | Admitting: *Deleted

## 2018-09-17 ENCOUNTER — Other Ambulatory Visit: Payer: Self-pay

## 2018-09-17 DIAGNOSIS — M810 Age-related osteoporosis without current pathological fracture: Secondary | ICD-10-CM | POA: Diagnosis not present

## 2018-09-17 DIAGNOSIS — E538 Deficiency of other specified B group vitamins: Secondary | ICD-10-CM | POA: Diagnosis not present

## 2018-09-17 MED ORDER — CYANOCOBALAMIN 1000 MCG/ML IJ SOLN
1000.0000 ug | Freq: Once | INTRAMUSCULAR | Status: AC
  Administered 2018-09-17: 1000 ug via INTRAMUSCULAR

## 2018-09-17 MED ORDER — ROMOSOZUMAB-AQQG 105 MG/1.17ML ~~LOC~~ SOSY
105.0000 mg | PREFILLED_SYRINGE | Freq: Once | SUBCUTANEOUS | Status: AC
  Administered 2018-09-17: 105 mg via SUBCUTANEOUS

## 2018-09-19 DIAGNOSIS — Z8262 Family history of osteoporosis: Secondary | ICD-10-CM | POA: Diagnosis not present

## 2018-09-19 DIAGNOSIS — Z1231 Encounter for screening mammogram for malignant neoplasm of breast: Secondary | ICD-10-CM | POA: Diagnosis not present

## 2018-09-19 DIAGNOSIS — M810 Age-related osteoporosis without current pathological fracture: Secondary | ICD-10-CM | POA: Diagnosis not present

## 2018-09-19 LAB — HM DEXA SCAN

## 2018-09-19 LAB — HM MAMMOGRAPHY

## 2018-09-20 ENCOUNTER — Encounter: Payer: Self-pay | Admitting: Internal Medicine

## 2018-10-11 ENCOUNTER — Other Ambulatory Visit: Payer: Self-pay

## 2018-10-11 ENCOUNTER — Encounter: Payer: Self-pay | Admitting: Internal Medicine

## 2018-10-11 ENCOUNTER — Ambulatory Visit (INDEPENDENT_AMBULATORY_CARE_PROVIDER_SITE_OTHER): Payer: Medicare Other | Admitting: Internal Medicine

## 2018-10-11 VITALS — BP 108/60 | HR 74 | Temp 98.4°F | Ht 61.0 in | Wt 176.0 lb

## 2018-10-11 DIAGNOSIS — M810 Age-related osteoporosis without current pathological fracture: Secondary | ICD-10-CM

## 2018-10-11 NOTE — Patient Instructions (Signed)
Let's plan on the annual reclast infusion after you finish off the evenity injections.  I hope you'll be able to get back in the water at the beach soon.    Fall Prevention in the Home, Adult Falls can cause injuries and can affect people from all age groups. There are many simple things that you can do to make your home safe and to help prevent falls. Ask for help when making these changes, if needed. What actions can I take to prevent falls? General instructions  Use good lighting in all rooms. Replace any light bulbs that burn out.  Turn on lights if it is dark. Use night-lights.  Place frequently used items in easy-to-reach places. Lower the shelves around your home if necessary.  Set up furniture so that there are clear paths around it. Avoid moving your furniture around.  Remove throw rugs and other tripping hazards from the floor.  Avoid walking on wet floors.  Fix any uneven floor surfaces.  Add color or contrast paint or tape to grab bars and handrails in your home. Place contrasting color strips on the first and last steps of stairways.  When you use a stepladder, make sure that it is completely opened and that the sides are firmly locked. Have someone hold the ladder while you are using it. Do not climb a closed stepladder.  Be aware of any and all pets. What can I do in the bathroom?      Keep the floor dry. Immediately clean up any water that spills onto the floor.  Remove soap buildup in the tub or shower on a regular basis.  Use non-skid mats or decals on the floor of the tub or shower.  Attach bath mats securely with double-sided, non-slip rug tape.  If you need to sit down while you are in the shower, use a plastic, non-slip stool.  Install grab bars by the toilet and in the tub and shower. Do not use towel bars as grab bars. What can I do in the bedroom?  Make sure that a bedside light is easy to reach.  Do not use oversized bedding that drapes onto the  floor.  Have a firm chair that has side arms to use for getting dressed. What can I do in the kitchen?  Clean up any spills right away.  If you need to reach for something above you, use a sturdy step stool that has a grab bar.  Keep electrical cables out of the way.  Do not use floor polish or wax that makes floors slippery. If you must use wax, make sure that it is non-skid floor wax. What can I do in the stairways?  Do not leave any items on the stairs.  Make sure that you have a light switch at the top of the stairs and the bottom of the stairs. Have them installed if you do not have them.  Make sure that there are handrails on both sides of the stairs. Fix handrails that are broken or loose. Make sure that handrails are as long as the stairways.  Install non-slip stair treads on all stairs in your home.  Avoid having throw rugs at the top or bottom of stairways, or secure the rugs with carpet tape to prevent them from moving.  Choose a carpet design that does not hide the edge of steps on the stairway.  Check any carpeting to make sure that it is firmly attached to the stairs. Fix any carpet that  is loose or worn. What can I do on the outside of my home?  Use bright outdoor lighting.  Regularly repair the edges of walkways and driveways and fix any cracks.  Remove high doorway thresholds.  Trim any shrubbery on the main path into your home.  Regularly check that handrails are securely fastened and in good repair. Both sides of any steps should have handrails.  Install guardrails along the edges of any raised decks or porches.  Clear walkways of debris and clutter, including tools and rocks.  Have leaves, snow, and ice cleared regularly.  Use sand or salt on walkways during winter months.  In the garage, clean up any spills right away, including grease or oil spills. What other actions can I take?  Wear closed-toe shoes that fit well and support your feet. Wear  shoes that have rubber soles or low heels.  Use mobility aids as needed, such as canes, walkers, scooters, and crutches.  Review your medicines with your health care provider. Some medicines can cause dizziness or changes in blood pressure, which increase your risk of falling. Talk with your health care provider about other ways that you can decrease your risk of falls. This may include working with a physical therapist or trainer to improve your strength, balance, and endurance. Where to find more information  Centers for Disease Control and Prevention, STEADI: WebmailGuide.co.za  [employer] on Aging: BrainJudge.co.uk Contact a health care provider if:  You are afraid of falling at home.  You feel weak, drowsy, or dizzy at home.  You fall at home. Summary  There are many simple things that you can do to make your home safe and to help prevent falls.  Ways to make your home safe include removing tripping hazards and installing grab bars in the bathroom.  Ask for help when making these changes in your home. This information is not intended to replace advice given to you by your health care provider. Make sure you discuss any questions you have with your health care provider. Document Released: 03/04/2002 Document Revised: 02/24/2017 Document Reviewed: 10/27/2016 Elsevier Patient Education  2020 [employer].

## 2018-10-11 NOTE — Progress Notes (Signed)
Location:  [facility]  Provider:   Code Status:  Goals of Care:  Advanced Directives 08/06/2018  Does Patient Have a Medical Advance Directive? Yes  Type of Advance Directive [place]  Does patient want to make changes to medical advance directive? No - Patient declined  Copy of [place] in Chart? Yes - validated most recent copy scanned in chart (See row information)  Would patient like information on creating a medical advance directive? No - Patient declined     Chief Complaint  Patient presents with  . Follow-up    discuss bone density    HPI: Patient is a 78 y.o. female seen today for medical management of chronic diseases.     She is typically very active and swims 5X/week despite her physical disabilities. Due to covid-19 her pool has been closed and she has been unable to exercise. She has been trying to do small exercise videos at home and light walking. She uses a fitbit to record her daily step intake. Her average steps per day is between 5,000 and 7,000 steps. Patient uses a cane daily to walk around.   She describes her diet as poor and has not tried to change her diet since the pool has closed. Her weekly intake of fruits and vegetables is about 4X, she had bread twice a day, protein a few times a week. She does not drink soda or sugary drinks. She does not drink alcohol.   She states she hurts all the time. Her pain is mainly in her back. She is seeing Dr.Torreabla for back pain.           Past Medical History:  Diagnosis Date  . Anemia, unspecified   . B12 deficiency due to diet   . Cramp of limb   . Edema of both legs   . Encounter for long-term (current) use of other medications   . Lumbago   . Obesity   . Osteoarthrosis, unspecified whether generalized or localized, unspecified site   . Restless legs syndrome (RLS)   . Senile osteoporosis     Past Surgical History:  Procedure Laterality Date  . CATARACT  EXTRACTION W/ INTRAOCULAR LENS  IMPLANT, BILATERAL  04/2014  . 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 [place]  2005  . [place]   stapleing   . VITRECTOMY Right    with membrane peeling for a macular pucker    Allergies  Allergen Reactions  . Prolia [Denosumab] Other (See Comments)    Chest pain  . Percocet [Oxycodone-Acetaminophen]     Confusion   . Shrimp [Shellfish Allergy]     All seafood    Outpatient Encounter Medications as of 10/11/2018  Medication Sig  . Calcium Carbonate-Vit D-Min (CALCIUM 1200 PO) Take 1 tablet by mouth daily.  . celecoxib (CELEBREX) 100 MG capsule Take 1 capsule (100 mg total) by mouth 2 (two) times daily.  . Cholecalciferol (VITAMIN D) 2000 UNITS tablet Take 2,000 Units by mouth daily.  . Cyanocobalamin (B-12 COMPLIANCE INJECTION IJ) Inject 1 mL as directed every 30 (thirty) days.  . diphenhydramine-acetaminophen (TYLENOL PM) 25-500 MG TABS tablet Take 1 tablet by mouth at bedtime as needed.  . gabapentin (NEURONTIN) 300 MG capsule Take 300 mg by mouth 3 (three) times daily.  . Iron 66 MG TABS Take 1 tablet by mouth daily.   [name] lisinopril (PRINIVIL,ZESTRIL) 5 MG tablet Take 1 tablet (5 mg total) by  mouth daily. Take 1 additional tablet by mouth if your blood pressure is 140/90 in the afternoon  . Multiple Vitamin (MULTIVITAMIN) tablet Take 1 tablet by mouth daily.  . Romosozumab-aqqg (EVENITY) 105 MG/1.17ML SOSY injection Inject 210 mg into the skin every 30 (thirty) days.   No facility-administered encounter medications on file as of 10/11/2018.     Review of Systems:  Review of Systems  Constitutional: Positive for activity change. Negative for appetite change and fatigue.  HENT: Negative.   Respiratory: Negative for cough, shortness of breath and wheezing.   Cardiovascular: Negative for chest pain, palpitations and leg swelling.  Gastrointestinal: Negative for constipation, diarrhea and nausea.  Genitourinary: Negative for  dysuria, frequency and hematuria.  Musculoskeletal: Positive for arthralgias, back pain, gait problem, joint swelling and myalgias.  Skin: Negative.   Neurological: Negative for dizziness, weakness and headaches.  Psychiatric/Behavioral: Negative.     Health Maintenance  Topic Date Due  . TETANUS/TDAP  09/05/2018  . INFLUENZA VACCINE  10/27/2018  . MAMMOGRAM  09/18/2020  . DEXA SCAN  Completed  . PNA vac Low Risk Adult  Completed    Physical Exam: Vitals:   10/11/18 1446  BP: 108/60  Pulse: 74  Temp: 98.4 F (36.9 C)  TempSrc: Oral  SpO2: 96%  Weight: 176 lb (79.8 kg)  Height: 5\' 1"  (1.549 m)   Body mass index is 33.25 kg/m. Physical Exam Constitutional:      General: She is not in acute distress.    Appearance: Normal appearance. She is not ill-appearing.  HENT:     Head: Normocephalic.  Cardiovascular:     Rate and Rhythm: Normal rate and regular rhythm.     Pulses: Normal pulses.     Heart sounds: Normal heart sounds. No murmur.  Pulmonary:     Effort: Pulmonary effort is normal. No respiratory distress.     Breath sounds: Normal breath sounds. No wheezing.  Abdominal:     General: There is no distension.     Palpations: Abdomen is soft.     Tenderness: There is no abdominal tenderness.  Musculoskeletal: Normal range of motion.        General: Tenderness present.     Comments: Bilateral ankle tenderness when palpated  Skin:    General: Skin is warm and dry.     Capillary Refill: Capillary refill takes 2 to 3 seconds.  Neurological:     General: No focal deficit present.     Mental Status: She is alert and oriented to person, place, and time. Mental status is at baseline.  Psychiatric:        Mood and Affect: Mood normal.        Behavior: Behavior normal.     Labs reviewed: Basic Metabolic Panel: Recent Labs    08/01/18 0804  NA 138  K 4.4  CL 105  CO2 27  GLUCOSE 91  BUN 24  CREATININE 0.94*  CALCIUM 9.5   Liver Function Tests: Recent  Labs    08/01/18 0804  AST 30  ALT 16  BILITOT 0.5  PROT 6.8   No results for input(s): LIPASE, AMYLASE in the last 8760 hours. No results for input(s): AMMONIA in the last 8760 hours. CBC: Recent Labs    08/01/18 0804  WBC 6.5  NEUTROABS 3,517  HGB 11.0*  HCT 33.7*  MCV 92.6  PLT 272   Lipid Panel: Recent Labs    08/01/18 0804  CHOL 171  HDL 58  LDLCALC 90  TRIG 130  CHOLHDL 2.9   Lab Results  Component Value Date   HGBA1C 5.6 03/04/2013    Procedures since last visit: No results found.  Assessment/Plan  1. Senile osteoporosis - continue Evenity until completion of 1 year in August 2020 - Reclast IV when Evenity completed - encourage weight bearing exercises - encourage weight loss  - promote a high in vegetables and fruits - encourage falls prevention plan with no loose rugs, cords, grab bars, using cane  2. 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 Algie Coffer - continue daily celebrex and gabapentin - encourage activity to reduce stiffness and prevention of muscle weakness  3. Essential hypertension - BP stable - encourage diet low in sodium - continue taking lisinopril  4. Primary osteoporosis of multiple joint - continue celebrex and gabapentin for pain - promote falls prevention within household - encourage light activity to prevent stiffness        Labs/tests ordered: CBC with differential/platelets, CMP with GFR, Lipid panel, Vitamin B12 in one year Next appt:  4 month follow up

## 2018-10-19 ENCOUNTER — Other Ambulatory Visit: Payer: Self-pay

## 2018-10-19 ENCOUNTER — Ambulatory Visit (INDEPENDENT_AMBULATORY_CARE_PROVIDER_SITE_OTHER): Payer: Medicare Other | Admitting: *Deleted

## 2018-10-19 DIAGNOSIS — E538 Deficiency of other specified B group vitamins: Secondary | ICD-10-CM | POA: Diagnosis not present

## 2018-10-19 DIAGNOSIS — M810 Age-related osteoporosis without current pathological fracture: Secondary | ICD-10-CM | POA: Diagnosis not present

## 2018-10-19 MED ORDER — CYANOCOBALAMIN 1000 MCG/ML IJ SOLN
1000.0000 ug | Freq: Once | INTRAMUSCULAR | Status: AC
  Administered 2018-10-19: 1000 ug via INTRAMUSCULAR

## 2018-10-19 MED ORDER — ROMOSOZUMAB-AQQG 105 MG/1.17ML ~~LOC~~ SOSY
105.0000 mg | PREFILLED_SYRINGE | Freq: Once | SUBCUTANEOUS | Status: AC
  Administered 2018-10-19: 210 mg via SUBCUTANEOUS

## 2018-11-01 ENCOUNTER — Encounter: Payer: Self-pay | Admitting: Internal Medicine

## 2018-11-01 DIAGNOSIS — I10 Essential (primary) hypertension: Secondary | ICD-10-CM

## 2018-11-02 MED ORDER — LISINOPRIL 5 MG PO TABS: 5.0000 mg | ORAL_TABLET | Freq: Every day | ORAL | 3 refills | Status: DC

## 2018-11-06 ENCOUNTER — Encounter: Payer: Self-pay | Admitting: Internal Medicine

## 2018-11-07 ENCOUNTER — Telehealth: Payer: Self-pay

## 2018-11-07 NOTE — Telephone Encounter (Signed)
[facility] states [facility] has run out for lisinopril. She states she has sent several messages through [place] to provider. She states [facility] needs to talk to provider before they will refill medication. She urgently request for someone to contact them so she can get her medication.

## 2018-11-07 NOTE — Telephone Encounter (Signed)
I responded to patient this morning thru her mychart that the refill was sent, 11/02/2018, we have confirmed receipt that [facility] has received it. Please see below:   lisinopril (ZESTRIL) 5 MG tablet 180 tablet 3 11/02/2018    Sig - Route: Take 1 tablet (5 mg total) by mouth daily. Take 1 additional tablet by mouth if your blood pressure is 140/90 in the afternoon - Oral   Sent to pharmacy as: lisinopril (ZESTRIL) 5 MG tablet   Notes to Pharmacy: Patient noting more benefit from "qualitest" manufacturer   E-Prescribing Status: Receipt confirmed by pharmacy (11/02/2018 8:15 AM EDT)

## 2018-11-15 DIAGNOSIS — H52223 Regular astigmatism, bilateral: Secondary | ICD-10-CM | POA: Diagnosis not present

## 2018-11-15 DIAGNOSIS — H524 Presbyopia: Secondary | ICD-10-CM | POA: Diagnosis not present

## 2018-11-15 DIAGNOSIS — H04123 Dry eye syndrome of bilateral lacrimal glands: Secondary | ICD-10-CM | POA: Diagnosis not present

## 2018-11-15 DIAGNOSIS — H5203 Hypermetropia, bilateral: Secondary | ICD-10-CM | POA: Diagnosis not present

## 2018-11-23 ENCOUNTER — Other Ambulatory Visit: Payer: Self-pay

## 2018-11-23 ENCOUNTER — Ambulatory Visit (INDEPENDENT_AMBULATORY_CARE_PROVIDER_SITE_OTHER): Payer: Medicare Other | Admitting: *Deleted

## 2018-11-23 DIAGNOSIS — E538 Deficiency of other specified B group vitamins: Secondary | ICD-10-CM

## 2018-11-23 DIAGNOSIS — M810 Age-related osteoporosis without current pathological fracture: Secondary | ICD-10-CM | POA: Diagnosis not present

## 2018-11-23 MED ORDER — ROMOSOZUMAB-AQQG 105 MG/1.17ML ~~LOC~~ SOSY
105.0000 mg | PREFILLED_SYRINGE | Freq: Once | SUBCUTANEOUS | Status: AC
  Administered 2018-11-23: 210 mg via SUBCUTANEOUS

## 2018-11-23 MED ORDER — CYANOCOBALAMIN 1000 MCG/ML IJ SOLN
1000.0000 ug | Freq: Once | INTRAMUSCULAR | Status: AC
  Administered 2018-11-23: 1000 ug via INTRAMUSCULAR

## 2018-11-30 DIAGNOSIS — Z23 Encounter for immunization: Secondary | ICD-10-CM | POA: Diagnosis not present

## 2018-12-13 ENCOUNTER — Encounter: Payer: Self-pay | Admitting: Internal Medicine

## 2018-12-17 NOTE — Telephone Encounter (Signed)
We currently have all 3 in stock. Patient's appointment is 02/01/2019 and the staff will need to check the week prior to confirm all 3 still in stock. We usually keep b12 and TD in stock and order Evenity based on patient need

## 2019-01-28 DIAGNOSIS — L570 Actinic keratosis: Secondary | ICD-10-CM | POA: Diagnosis not present

## 2019-01-28 DIAGNOSIS — D0439 Carcinoma in situ of skin of other parts of face: Secondary | ICD-10-CM | POA: Diagnosis not present

## 2019-01-28 DIAGNOSIS — C44629 Squamous cell carcinoma of skin of left upper limb, including shoulder: Secondary | ICD-10-CM | POA: Diagnosis not present

## 2019-01-31 DIAGNOSIS — M5136 Other intervertebral disc degeneration, lumbar region: Secondary | ICD-10-CM | POA: Diagnosis not present

## 2019-01-31 DIAGNOSIS — M4126 Other idiopathic scoliosis, lumbar region: Secondary | ICD-10-CM | POA: Diagnosis not present

## 2019-01-31 DIAGNOSIS — M47816 Spondylosis without myelopathy or radiculopathy, lumbar region: Secondary | ICD-10-CM | POA: Diagnosis not present

## 2019-02-01 ENCOUNTER — Other Ambulatory Visit: Payer: Self-pay

## 2019-02-01 ENCOUNTER — Ambulatory Visit (INDEPENDENT_AMBULATORY_CARE_PROVIDER_SITE_OTHER): Payer: Medicare Other

## 2019-02-01 DIAGNOSIS — E538 Deficiency of other specified B group vitamins: Secondary | ICD-10-CM | POA: Diagnosis not present

## 2019-02-01 DIAGNOSIS — Z23 Encounter for immunization: Secondary | ICD-10-CM | POA: Diagnosis not present

## 2019-02-01 DIAGNOSIS — M810 Age-related osteoporosis without current pathological fracture: Secondary | ICD-10-CM

## 2019-02-01 MED ORDER — CYANOCOBALAMIN 1000 MCG/ML IJ SOLN
1000.0000 ug | Freq: Once | INTRAMUSCULAR | Status: AC
  Administered 2019-02-01: 1000 ug via INTRAMUSCULAR

## 2019-02-01 MED ORDER — TETANUS-DIPHTHERIA TOXOIDS TD 5-2 LFU IM INJ: 0.5000 mL | INJECTION | Freq: Once | INTRAMUSCULAR | Status: DC

## 2019-02-01 MED ORDER — ROMOSOZUMAB-AQQG 105 MG/1.17ML ~~LOC~~ SOSY
210.0000 mg | PREFILLED_SYRINGE | Freq: Once | SUBCUTANEOUS | Status: AC
  Administered 2019-02-01: 10:00:00 210 mg via SUBCUTANEOUS

## 2019-02-04 ENCOUNTER — Other Ambulatory Visit: Payer: Self-pay

## 2019-02-04 ENCOUNTER — Ambulatory Visit: Payer: Medicare Other | Admitting: Orthotics

## 2019-02-04 ENCOUNTER — Encounter: Payer: Self-pay | Admitting: Internal Medicine

## 2019-02-04 ENCOUNTER — Ambulatory Visit (INDEPENDENT_AMBULATORY_CARE_PROVIDER_SITE_OTHER): Payer: Medicare Other | Admitting: Internal Medicine

## 2019-02-04 VITALS — BP 100/62 | HR 72 | Temp 98.3°F | Ht 61.5 in | Wt 176.2 lb

## 2019-02-04 DIAGNOSIS — M8949 Other hypertrophic osteoarthropathy, multiple sites: Secondary | ICD-10-CM

## 2019-02-04 DIAGNOSIS — M810 Age-related osteoporosis without current pathological fracture: Secondary | ICD-10-CM

## 2019-02-04 DIAGNOSIS — H538 Other visual disturbances: Secondary | ICD-10-CM | POA: Diagnosis not present

## 2019-02-04 DIAGNOSIS — K219 Gastro-esophageal reflux disease without esophagitis: Secondary | ICD-10-CM | POA: Diagnosis not present

## 2019-02-04 DIAGNOSIS — E538 Deficiency of other specified B group vitamins: Secondary | ICD-10-CM

## 2019-02-04 DIAGNOSIS — M159 Polyosteoarthritis, unspecified: Secondary | ICD-10-CM

## 2019-02-04 DIAGNOSIS — M5416 Radiculopathy, lumbar region: Secondary | ICD-10-CM

## 2019-02-04 DIAGNOSIS — Q6671 Congenital pes cavus, right foot: Secondary | ICD-10-CM

## 2019-02-04 DIAGNOSIS — I10 Essential (primary) hypertension: Secondary | ICD-10-CM | POA: Diagnosis not present

## 2019-02-04 DIAGNOSIS — M779 Enthesopathy, unspecified: Secondary | ICD-10-CM

## 2019-02-04 DIAGNOSIS — M150 Primary generalized (osteo)arthritis: Secondary | ICD-10-CM

## 2019-02-04 MED ORDER — LISINOPRIL 5 MG PO TABS: 5.0000 mg | ORAL_TABLET | Freq: Every day | ORAL | 3 refills | Status: DC | PRN

## 2019-02-04 NOTE — Progress Notes (Signed)
Patient just need to know proper placement of RF wedges I had provided her.  Very sweet lady.

## 2019-02-04 NOTE — Progress Notes (Signed)
Location:  [facility] clinic Provider:  Jonni Oelkers L. Mariea Clonts, D.O., C.M.D.  Goals of Care:  Advanced Directives 02/04/2019  Does Jody [name] Have a Medical Advance Directive? Yes  Type of Paramedic of [place];Living will  Does Jody [name] want to make changes to medical advance directive? No - Jody [name] declined  Copy of [place] in Chart? Yes - validated most recent copy scanned in chart (See row information)  Would Jody [name] like information on creating a medical advance directive? -     Chief Complaint  Jody [name] presents with  . Medical Management of Chronic Issues    Wants to discuss health issues/concerns    HPI: Jody [name] is a 78 y.o. female seen today for medical management of chronic diseases.    11/6 was last evenity--she did not have good results.  We had decided she would go for reclast in December once a year as her continued therapy.    Blood pressure:  BP machine was very close to ours when she calibrated it today.  She never takes the lisinopril automatically b/c she may be way low.  She checks it in am and evening.  135 takes 5mg , if over 150, takes a second 5mg .  Last night she was 75/48.  One morning she was 82/59.  A couple mornings it was [id number] systolic.    She has challenges with the generics being ineffective at times.  The generic she was given this time is the one that didn't work for her before.  She is soon starting that prescription.  Acid reflux:  She asks what causes.  She takes celebrex one in the morning only and likes to get more [place] a [place]  She can tell if she misses that one.  She does eat with the celebrex.  If she sleeps on the left side, she does not have GERD.  This is not her preferred side.  She does not eat late at night.  Her major meal will be 2-3pm.  She has the uncomfortable feeling nightly here lately.  Discussed elevating the head and she does not do this (her husband shares  the bed).  She has to be careful with her neck--has a very flat pillow.  Sometimes may eat at 6pm and goes to bed at 10:30pm.   Of course, mentioned weight loss.  Says she never lost w/o an appetite suppressant.  Weight is stable since July.  She has been cooking with covid.  Talks about how awful cooking is.  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   She is really bothered by her feet.    Started when she broke her knee cap Nov '17.  In Dec when she went back to the doctor there, feet felt like blocks of ice, couldn't tell if socks were on and felt like skin was too tight for her feet.  Knee cap healed, but those symptoms persist.  No active burning at this moment.  Left foot will be swollen and rounded on top so that veins are not visible.   Right does not get that way and she does not have swelling in the ankles.  She has b12 deficiency and neuropathy.    She goes to Dr. Sherrell Puller office for an injection tomorrow but there are no plans for surgery.  She has terrible lumbar spinal stenosis and has severe osteoporosis.  She is on gabapentin 300mg  po tid.  Lots of  days she takes just two (am and night).  In daytime, she needed it daily for a while, but not here lately.    Goes to Dr. Marica Otter and then to Shanon Brow at [facility] for her glasses.  She saw Dr. Gay Filler to blurred vision.  Had minor adjustment in her lenses.  One eye was the problem.  It didn't help a bit--she went back in August--books, road signs remain blurry.  only sometimes.  Major change in glasses done and it was filled.  I august with the new Rx, she could see well, but at times, she still has periods of blurred vision.  She reads on her phone a lot--newspapers and magazines, scrolls on facebook and reads books all on her phone.  It's the right more than left so she wonders about her lids.  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   Past Medical History:  Diagnosis Date    . Anemia, unspecified   . B12 deficiency due to diet   . Cramp of limb   . Edema of both legs   . Encounter for long-term (current) use of other medications   . Lumbago   . Obesity   . Osteoarthrosis, unspecified whether generalized or localized, unspecified site   . Restless legs syndrome (RLS)   . Senile osteoporosis     Past Surgical History:  Procedure Laterality Date  . CATARACT EXTRACTION W/ INTRAOCULAR LENS  IMPLANT, BILATERAL  04/2014  . 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 [place]  2005  . [place]   stapleing   . VITRECTOMY Right    with membrane peeling for a macular pucker    Allergies  Allergen Reactions  . Prolia [Denosumab] Other (See Comments)    Chest pain  . Percocet [Oxycodone-Acetaminophen]     Confusion   . Shrimp [Shellfish Allergy]     All seafood    Outpatient Encounter Medications as of 02/04/2019  Medication Sig  . Calcium Carbonate-Vit D-Min (CALCIUM 1200 PO) Take 1 tablet by mouth daily.  . celecoxib (CELEBREX) 100 MG capsule Take 1 capsule (100 mg total) by mouth 2 (two) times daily.  . Cholecalciferol (VITAMIN D) 2000 UNITS tablet Take 2,000 Units by mouth daily.  . Cyanocobalamin (B-12 COMPLIANCE INJECTION IJ) Inject 1 mL as directed every 30 (thirty) days.  . diphenhydramine-acetaminophen (TYLENOL PM) 25-500 MG TABS tablet Take 1 tablet by mouth at bedtime as needed.  . gabapentin (NEURONTIN) 300 MG capsule Take 300 mg by mouth 3 (three) times daily.  . Iron 66 MG TABS Take 1 tablet by mouth daily.   [name] lisinopril (ZESTRIL) 5 MG tablet Take 1 tablet (5 mg total) by mouth daily. Take 1 additional tablet by mouth if your blood pressure is 140/90 in the afternoon  . Multiple Vitamin (MULTIVITAMIN) tablet Take 1 tablet by mouth daily.  . [DISCONTINUED] Romosozumab-aqqg (EVENITY) 105 MG/1.17ML SOSY injection Inject 210 mg into the skin every 30 (thirty) days.   No facility-administered encounter medications on file as  of 02/04/2019.     Review of Systems:  Review of Systems  Constitutional: Negative for chills, fever and malaise/fatigue.  HENT: Negative for hearing loss.   Eyes: Positive for blurred vision.  Respiratory: Negative for cough and shortness of breath.   Cardiovascular: Positive for leg swelling. Negative for chest pain and palpitations.       Left foot not really leg  Gastrointestinal:  Positive for heartburn. Negative for abdominal pain, blood in stool, constipation, diarrhea, melena, nausea and vomiting.  Genitourinary: Negative for dysuria.  Musculoskeletal: Positive for back pain and neck pain. Negative for falls.  Skin: Negative for itching and rash.  Neurological: Positive for tingling and sensory change. Negative for dizziness and loss of consciousness.  Endo/Heme/Allergies: Does not bruise/bleed easily.  Psychiatric/Behavioral: Negative for depression and memory loss. The Jody [name] is not nervous/anxious and does not have insomnia.     Health Maintenance  Topic Date Due  . MAMMOGRAM  09/18/2020  . TETANUS/TDAP  01/31/2029  . INFLUENZA VACCINE  Completed  . DEXA SCAN  Completed  . PNA vac Low Risk Adult  Completed    Physical Exam: Vitals:   02/04/19 1021  BP: 100/62  Pulse: 72  Temp: 98.3 F (36.8 C)  TempSrc: Oral  SpO2: 96%  Weight: 176 lb 3.2 oz (79.9 kg)  Height: 5' 1.5" (1.562 m)   Body mass index is 32.75 kg/m. Physical Exam Vitals signs reviewed.  Constitutional:      General: She is not in acute distress.    Appearance: Normal appearance. She is obese. She is not toxic-appearing.  HENT:     Head: Normocephalic and atraumatic.  Eyes:     Extraocular Movements: Extraocular movements intact.     Conjunctiva/sclera: Conjunctivae normal.     Pupils: Pupils are equal, round, and reactive to light.     Comments: glasses  Cardiovascular:     Rate and Rhythm: Normal rate and regular rhythm.     Pulses: Normal pulses.     Heart sounds: Normal heart sounds.   Pulmonary:     Effort: Pulmonary effort is normal.     Breath sounds: Normal breath sounds. No wheezing, rhonchi or rales.  Abdominal:     General: Bowel sounds are normal.  Musculoskeletal: Normal range of motion.     Right lower leg: No edema.     Left lower leg: No edema.     Comments: Both feet with arching of bony structures on dorsum; hammertoe on right second toe; diminished sensation of right worse than left foot, proprioception intact  Skin:    General: Skin is warm and dry.     Capillary Refill: Capillary refill takes less than 2 seconds.  Neurological:     General: No focal deficit present.     Mental Status: She is alert and oriented to person, place, and time.     Sensory: Sensory deficit present.     Gait: Gait normal.     Comments: Using her walking stick all of the time now  Psychiatric:        Mood and Affect: Mood normal.        Behavior: Behavior normal.        Thought Content: Thought content normal.        Judgment: Judgment normal.     Labs reviewed: Basic Metabolic Panel: Recent Labs    08/01/18 0804  NA 138  K 4.4  CL 105  CO2 27  GLUCOSE 91  BUN 24  CREATININE 0.94*  CALCIUM 9.5   Liver Function Tests: Recent Labs    08/01/18 0804  AST 30  ALT 16  BILITOT 0.5  PROT 6.8   No results for input(s): LIPASE, AMYLASE in the last 8760 hours. No results for input(s): AMMONIA in the last 8760 hours. CBC: Recent Labs    08/01/18 0804  WBC 6.5  NEUTROABS 3,517  HGB 11.0*  HCT 33.7*  MCV 92.6  PLT 272   Lipid Panel: Recent Labs    08/01/18 0804  CHOL 171  HDL 58  LDLCALC 90  TRIG 130  CHOLHDL 2.9   Lab Results  Component Value Date   HGBA1C 5.6 03/04/2013    Assessment/Plan 1. Benign essential hypertension - bp at goal with current regimen typically--cont the prn approach which works for her -hydrate to make sure bp does not drop low - lisinopril (ZESTRIL) 5 MG tablet; Take 1 tablet (5 mg total) by mouth daily as needed  (BP over 140/90). Take 1 additional tablet by mouth if your blood pressure is 150/90 in the afternoon  Dispense: 180 tablet; Refill: 3  2. Blurred vision -sounds like a retinal issue -had prior macular pucker requiring vitrectomy on right and right eye is the bigger problem -see Dr. Marica Otter (optometry) and could not find who she saw for that surgery though Dr. Bing Plume did her cataract surgery - Ambulatory referral to Ophthalmology  3. 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 Sherrell Puller group -also gabapentin for left radiculopathy and neuropathy  4. Senile osteoporosis -will arrange for reclast infusion at infusion center to be done after 03/03/19  -completed her year of evenity -could not take prolia (chest pain)  5. Primary osteoarthritis involving multiple joints -cont celebrex daily with food  6. B12 deficiency due to diet -b12 still low normal even with injections -cont monthly injections -follows poor diet and always has and does not plan to change this  7. Gastroesophageal reflux disease, unspecified whether esophagitis present -counseled on elevating HOB, avoiding eating before bed, foods that affect, and positioning that helps her -does not drink alcohol -avoiding PPIs due to already severe osteoporosis -take celebrex with food always  Labs/tests ordered:   Lab Orders  No laboratory test(s) ordered today   Next appt:  12/7 b12 injection; needs reclast infusion set up for around that same time  Waymond Meador L. Norwin Aleman, D.O. [facility] [address], [place] [address] Cell Phone (Mon-Fri 8am-5pm):  [phone] On Call:  [phone] & follow prompts after 5pm & weekends Office Phone:  [phone] Office Fax:  [phone]

## 2019-02-04 NOTE — Patient Instructions (Signed)
Capsaicin cream can be helpful for neuropathy.  Use gloves to apply and wash your hands well.

## 2019-02-05 ENCOUNTER — Telehealth: Payer: Self-pay | Admitting: *Deleted

## 2019-02-05 DIAGNOSIS — M5136 Other intervertebral disc degeneration, lumbar region: Secondary | ICD-10-CM | POA: Diagnosis not present

## 2019-02-05 NOTE — Telephone Encounter (Signed)
Jody [name] was working on this yesterday.  It's a paper form so not in epic.

## 2019-02-05 NOTE — Telephone Encounter (Signed)
Forwarded to [employer].

## 2019-02-05 NOTE — Telephone Encounter (Signed)
Patient called and stated that she did not see mention of the Reclast Infusion that was to be set up on her AVS. Patient is calling wanting to know if a order has been placed and if she will get a call once set up.   Reviewed chart and Reclast is suppose to be set up after 03/03/19. Informed patient I would send message to Dr. Mariea Clonts. Please Advise.

## 2019-02-08 NOTE — Telephone Encounter (Signed)
[facility] no longer does infusions, they are only performed at [facility].  Have called multiple times and only get a recording to leave a message, but have never received a call back.  Called patient and let her know that we were working on trying to set this up.

## 2019-02-12 DIAGNOSIS — H43813 Vitreous degeneration, bilateral: Secondary | ICD-10-CM | POA: Diagnosis not present

## 2019-02-12 DIAGNOSIS — H5203 Hypermetropia, bilateral: Secondary | ICD-10-CM | POA: Diagnosis not present

## 2019-02-12 DIAGNOSIS — H35371 Puckering of macula, right eye: Secondary | ICD-10-CM | POA: Diagnosis not present

## 2019-02-12 DIAGNOSIS — Z961 Presence of intraocular lens: Secondary | ICD-10-CM | POA: Diagnosis not present

## 2019-02-14 ENCOUNTER — Other Ambulatory Visit: Payer: Self-pay | Admitting: Internal Medicine

## 2019-02-14 DIAGNOSIS — M810 Age-related osteoporosis without current pathological fracture: Secondary | ICD-10-CM

## 2019-02-14 NOTE — Progress Notes (Signed)
BMP ordered due to need for calcium and creatinine prior to reclast infusion.

## 2019-02-15 ENCOUNTER — Other Ambulatory Visit: Payer: Self-pay

## 2019-02-15 ENCOUNTER — Other Ambulatory Visit: Payer: Medicare Other

## 2019-02-15 DIAGNOSIS — M810 Age-related osteoporosis without current pathological fracture: Secondary | ICD-10-CM | POA: Diagnosis not present

## 2019-02-16 LAB — BASIC METABOLIC PANEL WITH GFR
BUN/Creatinine Ratio: 18 (calc) (ref 6–22)
BUN: 22 mg/dL (ref 7–25)
CO2: 24 mmol/L (ref 20–32)
Calcium: 9.4 mg/dL (ref 8.6–10.4)
Chloride: 107 mmol/L (ref 98–110)
Creat: 1.19 mg/dL — ABNORMAL HIGH (ref 0.60–0.88)
GFR, Est African American: 50 mL/min/{1.73_m2} — ABNORMAL LOW (ref >=60)
GFR, Est Non African American: 43 mL/min/{1.73_m2} — ABNORMAL LOW (ref >=60)
Glucose, Bld: 62 mg/dL — ABNORMAL LOW (ref 65–99)
Potassium: 4.7 mmol/L (ref 3.5–5.3)
Sodium: 141 mmol/L (ref 135–146)

## 2019-02-27 DIAGNOSIS — M5136 Other intervertebral disc degeneration, lumbar region: Secondary | ICD-10-CM | POA: Diagnosis not present

## 2019-02-27 DIAGNOSIS — M4126 Other idiopathic scoliosis, lumbar region: Secondary | ICD-10-CM | POA: Diagnosis not present

## 2019-02-28 DIAGNOSIS — C44629 Squamous cell carcinoma of skin of left upper limb, including shoulder: Secondary | ICD-10-CM | POA: Diagnosis not present

## 2019-02-28 DIAGNOSIS — D0439 Carcinoma in situ of skin of other parts of face: Secondary | ICD-10-CM | POA: Diagnosis not present

## 2019-03-04 ENCOUNTER — Other Ambulatory Visit: Payer: Self-pay

## 2019-03-04 ENCOUNTER — Ambulatory Visit (INDEPENDENT_AMBULATORY_CARE_PROVIDER_SITE_OTHER): Payer: Medicare Other

## 2019-03-04 DIAGNOSIS — E538 Deficiency of other specified B group vitamins: Secondary | ICD-10-CM | POA: Diagnosis not present

## 2019-03-04 MED ORDER — CYANOCOBALAMIN 1000 MCG/ML IJ SOLN
1000.0000 ug | Freq: Once | INTRAMUSCULAR | Status: AC
  Administered 2019-03-04: 1000 ug via INTRAMUSCULAR

## 2019-03-04 NOTE — Telephone Encounter (Signed)
Spoke with Laverne at Short Stay about patient's Reclast infusion on 03/11/19.  Her appointment is at 10:00 a.m. on 12/14.  Location is [address] patient to relay this information.

## 2019-03-08 ENCOUNTER — Other Ambulatory Visit (HOSPITAL_COMMUNITY): Payer: Self-pay | Admitting: *Deleted

## 2019-03-11 ENCOUNTER — Ambulatory Visit (HOSPITAL_COMMUNITY)
Admission: RE | Admit: 2019-03-11 | Discharge: 2019-03-11 | Disposition: A | Discharge status: Home / Self Care | Payer: Medicare Other | Source: Ambulatory Visit | Attending: Internal Medicine | Admitting: Internal Medicine

## 2019-03-11 ENCOUNTER — Other Ambulatory Visit: Payer: Self-pay

## 2019-03-11 DIAGNOSIS — M810 Age-related osteoporosis without current pathological fracture: Secondary | ICD-10-CM | POA: Insufficient documentation

## 2019-03-11 DIAGNOSIS — Z888 Allergy status to other drugs, medicaments and biological substances status: Secondary | ICD-10-CM | POA: Diagnosis not present

## 2019-03-11 MED ORDER — ZOLEDRONIC ACID 5 MG/100ML IV SOLN: 5.0000 mg | Freq: Once | INTRAVENOUS | Status: AC

## 2019-03-11 MED ORDER — ZOLEDRONIC ACID 5 MG/100ML IV SOLN
INTRAVENOUS | Status: AC
  Administered 2019-03-11: 5 mg via INTRAVENOUS
  Filled 2019-03-11: qty 100

## 2019-03-11 NOTE — Discharge Instructions (Signed)

## 2019-03-13 ENCOUNTER — Other Ambulatory Visit: Payer: Self-pay

## 2019-03-13 ENCOUNTER — Ambulatory Visit: Payer: Self-pay

## 2019-03-13 ENCOUNTER — Encounter: Payer: Self-pay | Admitting: Family Medicine

## 2019-03-13 ENCOUNTER — Ambulatory Visit (INDEPENDENT_AMBULATORY_CARE_PROVIDER_SITE_OTHER): Payer: Medicare Other | Admitting: Family Medicine

## 2019-03-13 DIAGNOSIS — M542 Cervicalgia: Secondary | ICD-10-CM | POA: Diagnosis not present

## 2019-03-13 NOTE — Progress Notes (Signed)
Office Visit Note   Patient: Jody [name]           Date of Birth: [birth year]           MRN: [id number] Visit Date: 03/13/2019 Requested by: Gayland Curry, DO [place],  [place] [address] PCP: Gayland Curry, DO  Subjective: Chief Complaint  Patient presents with  . Neck - Pain    New problem started x 1 month ago, hurts with ROM and decrease ROM feels like it locks in place, nothing has changed, same pillow, did fall out of attic in 2009     HPI: She is here with neck pain.  She has had longstanding intermittent problems with her neck, but about a month ago it started to "lock" in certain positions and she feels like she cannot move it.  She has tried a new pillow.  She has had problems ever since falling out of an attic in 2008.  She has not had any significant radicular symptoms.  In the past she has done well with physical therapy per [name].  She has not been to her for this episode.              ROS: No fevers or chills.  All other systems were reviewed and are negative.  Objective: Vital Signs: There were no vitals taken for this visit.  Physical Exam:  General:  Alert and oriented, in no acute distress. Pulm:  Breathing unlabored. Psy:  Normal mood, congruent affect. Skin: No rash. Neck: She has limited range of motion with rotation and side bending as well as upward and downward gaze.  She is diffusely tender to palpation of the paraspinous muscles.  Upper extremity strength and reflexes are normal.  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  This is definitely worse than it was in 2008.  Assessment & Plan: 1.  Chronic neck pain with subacute worsening, underlying severe degenerative change.  Neurologic exam nonfocal. -She wants to try physical therapy per [name]. -MRI scan if she fails to improve.     Procedures: No procedures performed  No notes on file      PMFS History: Patient Active Problem List   Diagnosis Date Noted  . Gastroesophageal reflux disease 02/04/2019  . BMI 32.0-32.9,adult 02/12/2018  . Obesity (BMI 30.0-34.9) 07/17/2017  . Impingement syndrome of right shoulder 07/04/2016  . Chronic pain of both knees 07/04/2016  . 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 Displaced transverse fracture of left patella, subsequent encounter for closed fracture with delayed healing 02/03/2016  . Finger fracture, left 02/03/2016  . Leg cramps, sleep related 11/19/2015  . Cervical spondylosis without myelopathy 06/08/2015  . Osteoarthritis of finger of right hand 06/08/2015  . Absolute anemia 05/16/2014  . Primary osteoarthritis involving multiple joints 05/16/2014  . Cataract cortical, senile 05/16/2014  . Essential hypertension 09/03/2012  . Edema of both legs   . Senile osteoporosis   . B12 deficiency due to diet    Past Medical History:  Diagnosis Date  . Anemia, unspecified   . B12 deficiency due to diet   . Cramp of limb   . Edema of both legs   . Encounter for long-term (current) use of other medications   . Lumbago   . Obesity   . Osteoarthrosis, unspecified whether generalized or localized, unspecified site   . Restless legs syndrome (RLS)   .  Senile osteoporosis     Family History  Problem Relation Age of Onset  . Cancer Sister   . Cancer Brother     Past Surgical History:  Procedure Laterality Date  . CATARACT EXTRACTION W/ INTRAOCULAR LENS  IMPLANT, BILATERAL  04/2014  . 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 [place]  2005  . [place]   stapleing   . VITRECTOMY Right    with membrane peeling for a macular pucker   Social History   Occupational History  . Occupation: retired Administrator   Tobacco Use  . Smoking status: Former Smoker    Quit date: 05/22/1975    Years since quitting: 43.8  . Smokeless tobacco: Never Used  . Tobacco  comment: Quit in early 30's  Substance and Sexual Activity  . Alcohol use: No    Alcohol/week: 0.0 standard drinks  . Drug use: No  . Sexual activity: Never

## 2019-03-28 ENCOUNTER — Encounter: Payer: Self-pay | Admitting: Internal Medicine

## 2019-04-08 ENCOUNTER — Other Ambulatory Visit: Payer: Self-pay

## 2019-04-08 ENCOUNTER — Ambulatory Visit (INDEPENDENT_AMBULATORY_CARE_PROVIDER_SITE_OTHER): Payer: Medicare Other

## 2019-04-08 DIAGNOSIS — E538 Deficiency of other specified B group vitamins: Secondary | ICD-10-CM | POA: Diagnosis not present

## 2019-04-08 MED ORDER — CYANOCOBALAMIN 1000 MCG/ML IJ SOLN
1000.0000 ug | Freq: Once | INTRAMUSCULAR | Status: AC
  Administered 2019-04-08: 1000 ug via INTRAMUSCULAR

## 2019-04-12 ENCOUNTER — Encounter: Payer: Self-pay | Admitting: Internal Medicine

## 2019-04-21 ENCOUNTER — Ambulatory Visit: Payer: Medicare Other

## 2019-04-21 ENCOUNTER — Ambulatory Visit: Payer: Medicare Other | Attending: Internal Medicine

## 2019-04-21 DIAGNOSIS — Z23 Encounter for immunization: Secondary | ICD-10-CM | POA: Insufficient documentation

## 2019-04-21 NOTE — Progress Notes (Signed)
   Covid-19 Vaccination Clinic  Name:  Jody [name]    MRN: [id number] DOB: 1940/11/27  04/21/2019  [name] was observed post Covid-19 immunization for 30 minutes based on pre-vaccination screening without incidence. She was provided with Vaccine Information Sheet and instruction to access the V-Safe system.   [name] was instructed to call 911 with any severe reactions post vaccine: [name] Difficulty breathing  . Swelling of your face and throat  . A fast heartbeat  . A bad rash all over your body  . Dizziness and weakness    Immunizations Administered    Name Date Dose VIS Date Route   Pfizer COVID-19 Vaccine 04/21/2019 11:03 AM 0.3 mL 03/08/2019 Intramuscular   Manufacturer: [place]   Lot: [id number]   [place]: [id number]

## 2019-05-13 ENCOUNTER — Ambulatory Visit: Payer: Medicare Other

## 2019-05-14 ENCOUNTER — Ambulatory Visit: Payer: Medicare Other

## 2019-05-15 ENCOUNTER — Other Ambulatory Visit: Payer: Self-pay

## 2019-05-15 ENCOUNTER — Ambulatory Visit (INDEPENDENT_AMBULATORY_CARE_PROVIDER_SITE_OTHER): Payer: Medicare Other

## 2019-05-15 DIAGNOSIS — E538 Deficiency of other specified B group vitamins: Secondary | ICD-10-CM

## 2019-05-15 MED ORDER — CYANOCOBALAMIN 1000 MCG/ML IJ SOLN
1000.0000 ug | Freq: Once | INTRAMUSCULAR | Status: AC
  Administered 2019-05-15: 1000 ug via INTRAMUSCULAR

## 2019-05-17 ENCOUNTER — Ambulatory Visit: Payer: Medicare Other

## 2019-05-18 ENCOUNTER — Ambulatory Visit: Payer: Medicare Other | Attending: Internal Medicine

## 2019-05-18 DIAGNOSIS — Z23 Encounter for immunization: Secondary | ICD-10-CM

## 2019-05-18 NOTE — Progress Notes (Signed)
   Covid-19 Vaccination Clinic  Name:  TAEA DELANGE    MRN: [id number] DOB: 1940/09/15  05/18/2019  Ms. Stratis was observed post Covid-19 immunization for 15 minutes without incidence. She was provided with Vaccine Information Sheet and instruction to access the V-Safe system.   Ms. Hromadka was instructed to call 911 with any severe reactions post vaccine: [name] Difficulty breathing  . Swelling of your face and throat  . A fast heartbeat  . A bad rash all over your body  . Dizziness and weakness    Immunizations Administered    Name Date Dose VIS Date Route   Pfizer COVID-19 Vaccine 05/18/2019  9:38 AM 0.3 mL 03/08/2019 Intramuscular   Manufacturer: [place]   Lot: [id number]   [place]: [id number]

## 2019-06-14 ENCOUNTER — Ambulatory Visit (INDEPENDENT_AMBULATORY_CARE_PROVIDER_SITE_OTHER): Payer: Medicare Other

## 2019-06-14 DIAGNOSIS — E538 Deficiency of other specified B group vitamins: Secondary | ICD-10-CM | POA: Diagnosis not present

## 2019-06-14 MED ORDER — CYANOCOBALAMIN 1000 MCG/ML IJ SOLN
1000.0000 ug | Freq: Once | INTRAMUSCULAR | Status: AC
  Administered 2019-06-14: 1000 ug via INTRAMUSCULAR

## 2019-06-24 IMAGING — CR DG SHOULDER 2+V*R*
2 series · 2 of 2 positions shown · non-contrast
Comparison: 03/16/2007

CLINICAL DATA: pt here with c/o of right shoulder pain. Pt has a HX
of severe osteoarthritis and was attempting to reach back and "catch
a door" that was closing today in am.

EXAM:
RIGHT SHOULDER - 2+ VIEW

[shoulder grashey]
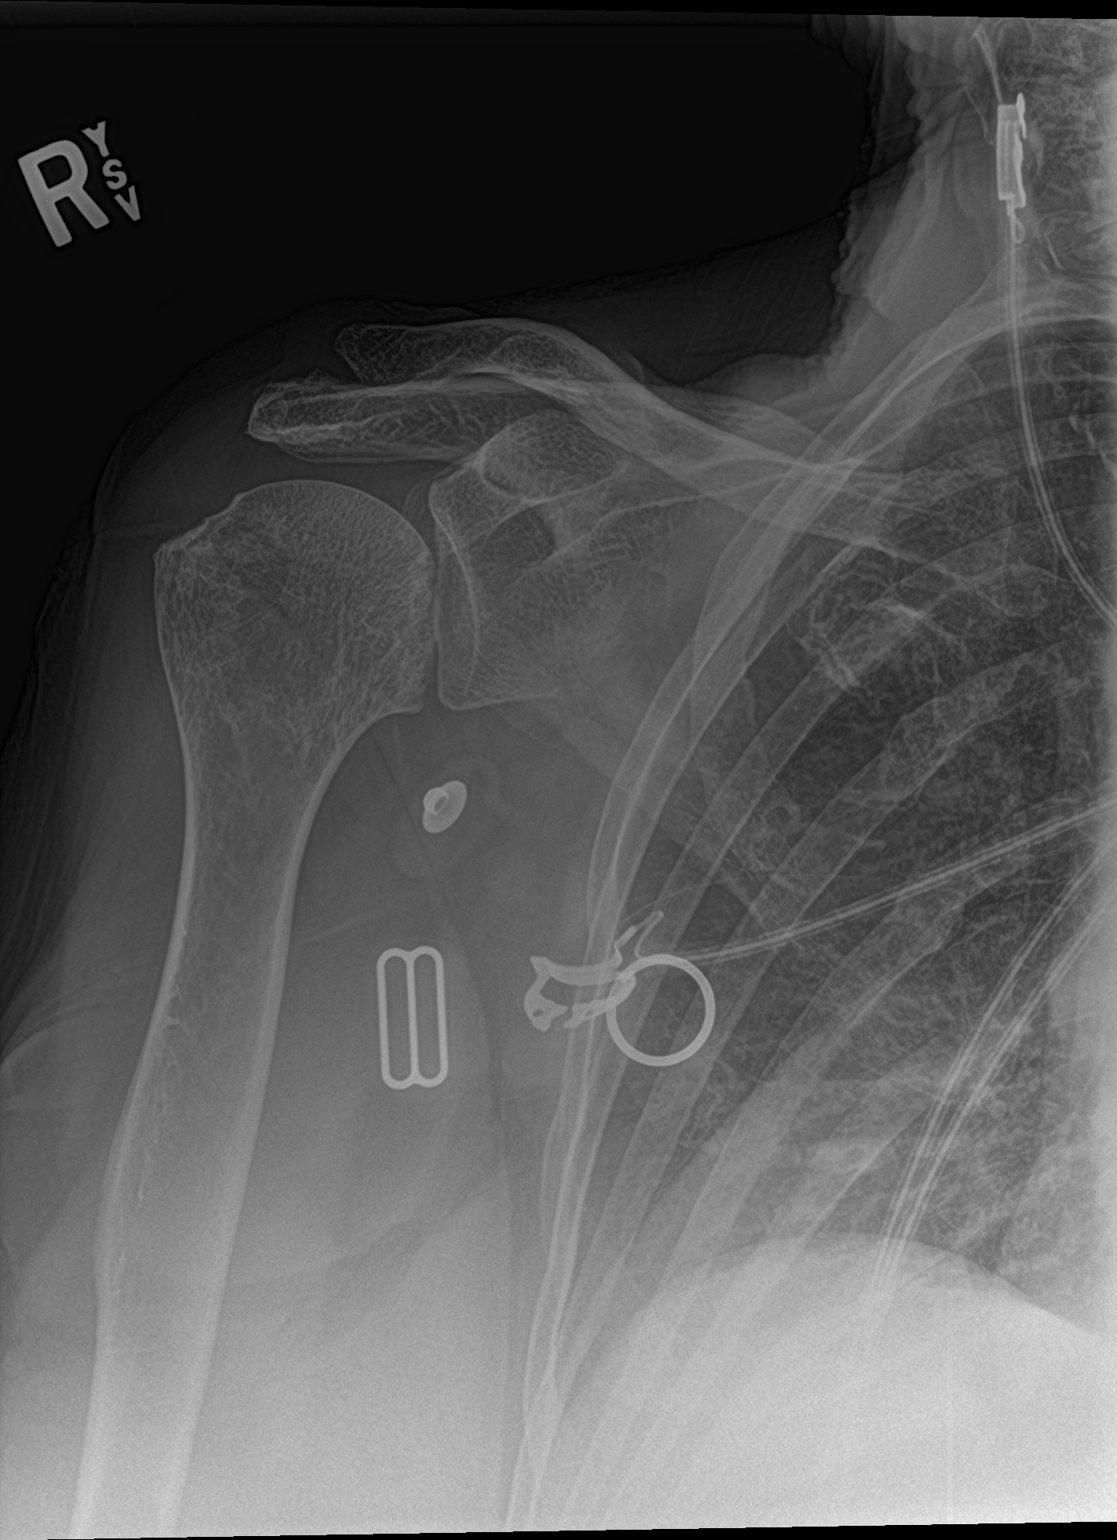

[shoulder y view]
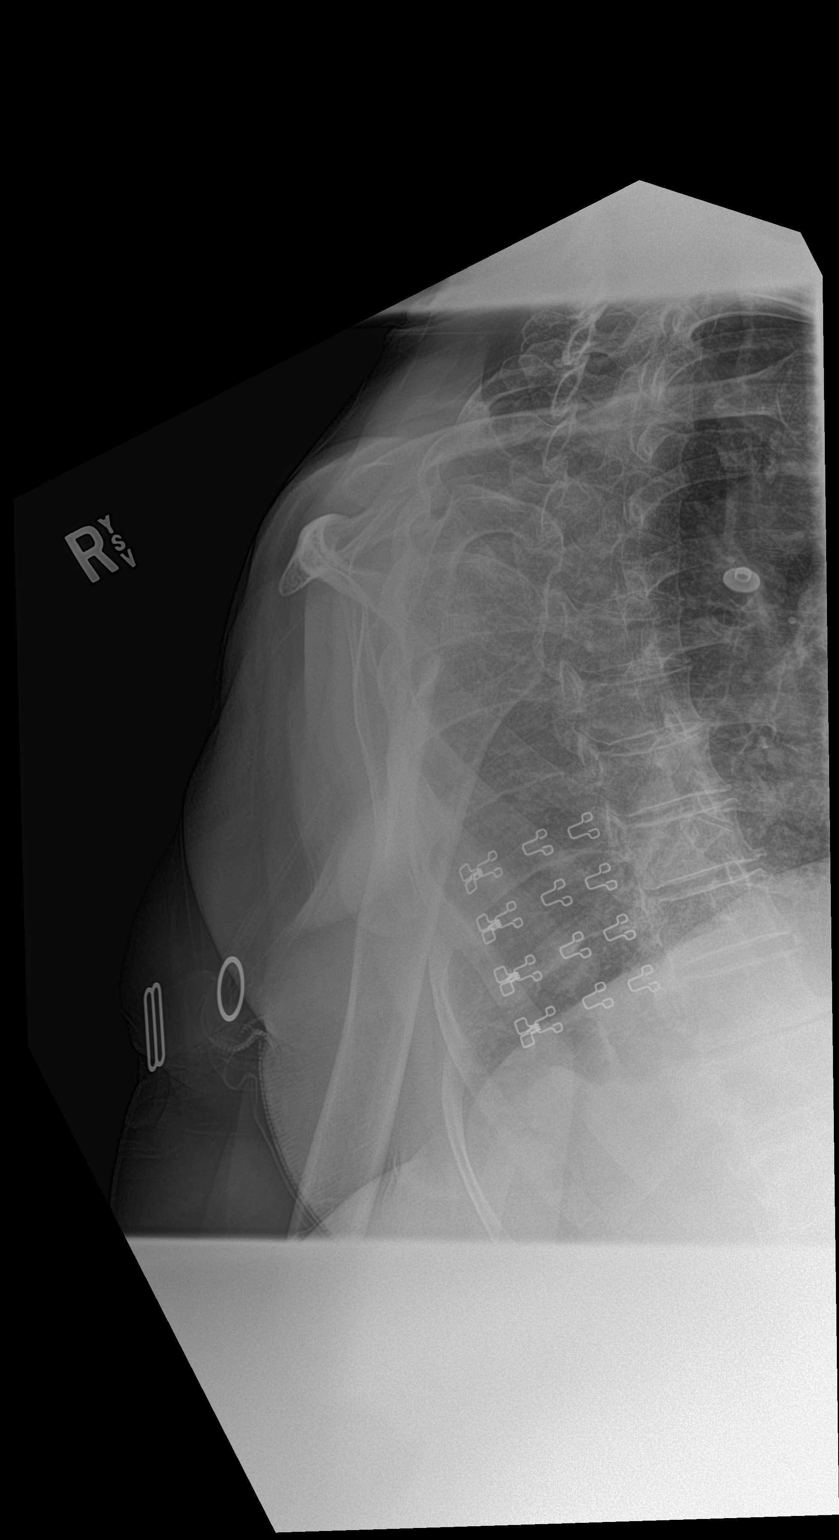

[2 of 2 positions shown; findings below may reference images not displayed]

FINDINGS: There is no evidence of fracture or dislocation. Osteopenia.
Subchondral irregularity in the humeral head. There is no other
evidence of arthropathy or other focal bone abnormality. Soft
tissues are unremarkable.
IMPRESSION: Chronic appearing subchondral degenerative change in the humeral
head. No fracture or dislocation.

## 2019-07-17 ENCOUNTER — Ambulatory Visit (INDEPENDENT_AMBULATORY_CARE_PROVIDER_SITE_OTHER): Payer: Medicare Other | Admitting: Family

## 2019-07-17 ENCOUNTER — Other Ambulatory Visit: Payer: Self-pay

## 2019-07-17 ENCOUNTER — Encounter: Payer: Self-pay | Admitting: Family

## 2019-07-17 VITALS — BP 122/68 | HR 68 | Temp 98.4°F | Ht 62.0 in | Wt 171.0 lb

## 2019-07-17 DIAGNOSIS — E538 Deficiency of other specified B group vitamins: Secondary | ICD-10-CM | POA: Diagnosis not present

## 2019-07-17 DIAGNOSIS — Z0000 Encounter for general adult medical examination without abnormal findings: Secondary | ICD-10-CM | POA: Diagnosis not present

## 2019-07-17 MED ORDER — CYANOCOBALAMIN 1000 MCG/ML IJ SOLN
1000.0000 ug | Freq: Once | INTRAMUSCULAR | Status: AC
  Administered 2019-07-17: 1000 ug via INTRAMUSCULAR

## 2019-07-17 NOTE — Patient Instructions (Signed)
[name] , Thank you for taking time to come for your Medicare Wellness Visit. I appreciate your ongoing commitment to your health goals. Please review the following plan we discussed and let me know if I can assist you in the future.   Screening recommendations/referrals: Colonoscopy: Age out  Mammogram: Up to date  Bone Density Up to date  Recommended yearly ophthalmology/optometry visit for glaucoma screening and checkup Recommended yearly dental visit for hygiene and checkup  Vaccinations: Influenza vaccine Up to date  Pneumococcal vaccine Up to date  Tdap vaccineUp to date due 01/31/2029  Shingles vaccine: Up to date   Advanced directives:Yes   Conditions/risks identified: Advance age female > 52 yrs,Hypertension,Hx smoking,BMI > 30   Next appointment: 1 year    Preventive Care 30 Years and Older, Female Preventive care refers to lifestyle choices and visits with your health care provider that can promote health and wellness. What does preventive care include?  A yearly physical exam. This is also called an annual well check.  Dental exams once or twice a year.  Routine eye exams. Ask your health care provider how often you should have your eyes checked.  Personal lifestyle choices, including:  Daily care of your teeth and gums.  Regular physical activity.  Eating a healthy diet.  Avoiding tobacco and drug use.  Limiting alcohol use.  Practicing safe sex.  Taking low-dose aspirin every day.  Taking vitamin and mineral supplements as recommended by your health care provider. What happens during an annual well check? The services and screenings done by your health care provider during your annual well check will depend on your age, overall health, lifestyle risk factors, and family history of disease. Counseling  Your health care provider may ask you questions about your:  Alcohol use.  Tobacco use.  Drug use.  Emotional well-being.  Home and  relationship well-being.  Sexual activity.  Eating habits.  History of falls.  Memory and ability to understand (cognition).  Work and work Statistician.  Reproductive health. Screening  You may have the following tests or measurements:  Height, weight, and BMI.  Blood pressure.  Lipid and cholesterol levels. These may be checked every 5 years, or more frequently if you are over 13 years old.  Skin check.  Lung cancer screening. You may have this screening every year starting at age 23 if you have a 30-pack-year history of smoking and currently smoke or have quit within the past 15 years.  Fecal occult blood test (FOBT) of the stool. You may have this test every year starting at age 69.  Flexible sigmoidoscopy or colonoscopy. You may have a sigmoidoscopy every 5 years or a colonoscopy every 10 years starting at age 11.  Hepatitis C blood test.  Hepatitis B blood test.  Sexually transmitted disease (STD) testing.  Diabetes screening. This is done by checking your blood sugar (glucose) after you have not eaten for a while (fasting). You may have this done every 1-3 years.  Bone density scan. This is done to screen for osteoporosis. You may have this done starting at age 66.  Mammogram. This may be done every 1-2 years. Talk to your health care provider about how often you should have regular mammograms. Talk with your health care provider about your test results, treatment options, and if necessary, the need for more tests. Vaccines  Your health care provider may recommend certain vaccines, such as:  Influenza vaccine. This is recommended every year.  Tetanus, diphtheria, and acellular  pertussis (Tdap, Td) vaccine. You may need a Td booster every 10 years.  Zoster vaccine. You may need this after age 7.  Pneumococcal 13-valent conjugate (PCV13) vaccine. One dose is recommended after age 62.  Pneumococcal polysaccharide (PPSV23) vaccine. One dose is recommended after  age 69. Talk to your health care provider about which screenings and vaccines you need and how often you need them. This information is not intended to replace advice given to you by your health care provider. Make sure you discuss any questions you have with your health care provider. Document Released: 04/10/2015 Document Revised: 12/02/2015 Document Reviewed: 01/13/2015 Elsevier Interactive Patient Education  2017 [place] Prevention in the Home Falls can cause injuries. They can happen to people of all ages. There are many things you can do to make your home safe and to help prevent falls. What can I do on the outside of my home?  Regularly fix the edges of walkways and driveways and fix any cracks.  Remove anything that might make you trip as you walk through a door, such as a raised step or threshold.  Trim any bushes or trees on the path to your home.  Use bright outdoor lighting.  Clear any walking paths of anything that might make someone trip, such as rocks or tools.  Regularly check to see if handrails are loose or broken. Make sure that both sides of any steps have handrails.  Any raised decks and porches should have guardrails on the edges.  Have any leaves, snow, or ice cleared regularly.  Use sand or salt on walking paths during winter.  Clean up any spills in your garage right away. This includes oil or grease spills. What can I do in the bathroom?  Use night lights.  Install grab bars by the toilet and in the tub and shower. Do not use towel bars as grab bars.  Use non-skid mats or decals in the tub or shower.  If you need to sit down in the shower, use a plastic, non-slip stool.  Keep the floor dry. Clean up any water that spills on the floor as soon as it happens.  Remove soap buildup in the tub or shower regularly.  Attach bath mats securely with double-sided non-slip rug tape.  Do not have throw rugs and other things on the floor that can  make you trip. What can I do in the bedroom?  Use night lights.  Make sure that you have a light by your bed that is easy to reach.  Do not use any sheets or blankets that are too big for your bed. They should not hang down onto the floor.  Have a firm chair that has side arms. You can use this for support while you get dressed.  Do not have throw rugs and other things on the floor that can make you trip. What can I do in the kitchen?  Clean up any spills right away.  Avoid walking on wet floors.  Keep items that you use a lot in easy-to-reach places.  If you need to reach something above you, use a strong step stool that has a grab bar.  Keep electrical cords out of the way.  Do not use floor polish or wax that makes floors slippery. If you must use wax, use non-skid floor wax.  Do not have throw rugs and other things on the floor that can make you trip. What can I do with my stairs?  Do not leave any items on the stairs.  Make sure that there are handrails on both sides of the stairs and use them. Fix handrails that are broken or loose. Make sure that handrails are as long as the stairways.  Check any carpeting to make sure that it is firmly attached to the stairs. Fix any carpet that is loose or worn.  Avoid having throw rugs at the top or bottom of the stairs. If you do have throw rugs, attach them to the floor with carpet tape.  Make sure that you have a light switch at the top of the stairs and the bottom of the stairs. If you do not have them, ask someone to add them for you. What else can I do to help prevent falls?  Wear shoes that:  Do not have high heels.  Have rubber bottoms.  Are comfortable and fit you well.  Are closed at the toe. Do not wear sandals.  If you use a stepladder:  Make sure that it is fully opened. Do not climb a closed stepladder.  Make sure that both sides of the stepladder are locked into place.  Ask someone to hold it for you,  if possible.  Clearly mark and make sure that you can see:  Any grab bars or handrails.  First and last steps.  Where the edge of each step is.  Use tools that help you move around (mobility aids) if they are needed. These include:  Canes.  Walkers.  Scooters.  Crutches.  Turn on the lights when you go into a dark area. Replace any light bulbs as soon as they burn out.  Set up your furniture so you have a clear path. Avoid moving your furniture around.  If any of your floors are uneven, fix them.  If there are any pets around you, be aware of where they are.  Review your medicines with your doctor. Some medicines can make you feel dizzy. This can increase your chance of falling. Ask your doctor what other things that you can do to help prevent falls. This information is not intended to replace advice given to you by your health care provider. Make sure you discuss any questions you have with your health care provider. Document Released: 01/08/2009 Document Revised: 08/20/2015 Document Reviewed: 04/18/2014 Elsevier Interactive Patient Education  2017 [employer].

## 2019-07-17 NOTE — Progress Notes (Signed)
Subjective:   Jody [name] is a 79 y.o. female who presents for Medicare Annual (Subsequent) preventive examination.  Review of Systems:  Cardiac Risk Factors include: advanced age (>61men, >1 women);hypertension;smoking/ tobacco exposure;obesity (BMI >30kg/m2)     Objective:     Vitals: BP 122/68   Pulse 68   Temp 98.4 F (36.9 C) (Tympanic)   Ht 5\' 2"  (1.575 m)   Wt 171 lb (77.6 kg)   SpO2 95%   BMI 31.28 kg/m   Body mass index is 31.28 kg/m.  Advanced Directives 07/17/2019 02/04/2019 08/06/2018 07/10/2018 03/24/2018 02/12/2018 07/17/2017  Does Patient Have a Medical Advance Directive? Yes Yes Yes Yes No Yes Yes  Type of Paramedic of [place];Living will [place];Living will Healthcare Power of [place]  Does patient want to make changes to medical advance directive? No - Patient declined No - Patient declined No - Patient declined No - Patient declined - No - Patient declined No - Patient declined  Copy of [place] in Chart? Yes - validated most recent copy scanned in chart (See row information) Yes - validated most recent copy scanned in chart (See row information) Yes - validated most recent copy scanned in chart (See row information) Yes - validated most recent copy scanned in chart (See row information) - Yes - validated most recent copy scanned in chart (See row information) Yes  Would patient like information on creating a medical advance directive? - - No - Patient declined - - - -    Tobacco Social History   Tobacco Use  Smoking Status Former Smoker  . Quit date: 05/22/1975  . Years since quitting: 44.1  Smokeless Tobacco Never Used  Tobacco Comment   Quit in early 55's     Counseling given: Not Answered Comment: Quit in early 30's   Clinical Intake:  Pre-visit preparation completed: No  Pain : 0-10 Pain  Score: 5 (rates 5/10 on scale after taking medication but worse without medication) Pain Type: Chronic pain Pain Location: Generalized(neck,back,hands ,sciatic  pain) Pain Orientation: Left Pain Radiating Towards: left leg Pain Descriptors / Indicators: Aching Pain Onset: Other (comment)(several years) Pain Frequency: Constant Pain Relieving Factors: cerebrex,tylenol and water aerobic Effect of Pain on Daily Activities: No except getting up from bending position  Pain Relieving Factors: cerebrex,tylenol and water aerobic  BMI - recorded: 31.28 Nutritional Status: BMI > 30  Obese Nutritional Risks: None Diabetes: No  How often do you need to have someone help you when you read instructions, pamphlets, or other written materials from your doctor or pharmacy?: 1 - Never What is the last grade level you completed in school?: x 2 Master's degree ( 22 yrs of education )     Information entered by :: Haillee Johann FNP-C  Past Medical History:  Diagnosis Date  . Anemia, unspecified   . B12 deficiency due to diet   . Cramp of limb   . Edema of both legs   . Encounter for long-term (current) use of other medications   . Lumbago   . Obesity   . Osteoarthrosis, unspecified whether generalized or localized, unspecified site   . Restless legs syndrome (RLS)   . Senile osteoporosis    Past Surgical History:  Procedure Laterality Date  . CATARACT EXTRACTION W/ INTRAOCULAR LENS  IMPLANT, BILATERAL  04/2014  . 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  [place]  2005  . [place]   stapleing   . VITRECTOMY Right    with membrane peeling for a macular pucker   Family History  Problem Relation Age of Onset  . Cancer Sister   . Cancer Brother    Social History   Socioeconomic History  . Marital status: Married    Spouse name: Not on file  . Number of children: Not on file  . Years of education: Not on file  . Highest education level: Not on file  Occupational  History  . Occupation: retired Administrator   Tobacco Use  . Smoking status: Former Smoker    Quit date: 05/22/1975    Years since quitting: 44.1  . Smokeless tobacco: Never Used  . Tobacco comment: Quit in early 30's  Substance and Sexual Activity  . Alcohol use: No    Alcohol/week: 0.0 standard drinks  . Drug use: No  . Sexual activity: Never  Other Topics Concern  . Not on file  Social History Narrative   Married -Elenore Rota married 1966   Former Smoker stopped 1977   Alcohol none   Exercise -Deep water 4 times a week for one hour   POA   Social Determinants of Radio broadcast assistant Strain:   . Difficulty of Paying Living Expenses:   Food Insecurity:   . Worried About Charity fundraiser in the Last Year:   . Arboriculturist in the Last Year:   Transportation Needs:   . Film/video editor (Medical):   [name] Lack of Transportation (Non-Medical):   Physical Activity:   . Days of Exercise per Week:   . Minutes of Exercise per Session:   Stress:   . Feeling of Stress :   Social Connections:   . Frequency of Communication with Friends and Family:   . Frequency of Social Gatherings with Friends and Family:   . Attends Religious Services:   . Active Member of Clubs or Organizations:   . Attends Archivist Meetings:   [name] Marital Status:     Outpatient Encounter Medications as of 07/17/2019  Medication Sig  . Calcium Carbonate-Vit D-Min (CALCIUM 1200 PO) Take 1 tablet by mouth daily.  . celecoxib (CELEBREX) 100 MG capsule Take 1 capsule (100 mg total) by mouth 2 (two) times daily.  . Cholecalciferol (VITAMIN D) 2000 UNITS tablet Take 2,000 Units by mouth daily.  . Cyanocobalamin (B-12 COMPLIANCE INJECTION IJ) Inject 1 mL as directed every 30 (thirty) days.  . diphenhydramine-acetaminophen (TYLENOL PM) 25-500 MG TABS tablet Take 1 tablet by mouth at bedtime as needed.  . gabapentin (NEURONTIN) 300 MG capsule Take 300 mg by mouth 3 (three) times daily.  .  Iron 66 MG TABS Take 1 tablet by mouth daily.   [name] lisinopril (ZESTRIL) 5 MG tablet Take 1 tablet (5 mg total) by mouth daily as needed (BP over 140/90). Take 1 additional tablet by mouth if your blood pressure is 150/90 in the afternoon  . Multiple Vitamin (MULTIVITAMIN) tablet Take 1 tablet by mouth daily.   No facility-administered encounter medications on file as of 07/17/2019.    Activities of Daily Living In your present state of health, do you have any difficulty performing the following activities: 07/17/2019  Hearing? N  Vision? Y  Comment vision is blurry follows up with Opthalmology  Difficulty concentrating or making decisions? N  Walking or climbing stairs? Y  Comment uses a cane limited due  to knee pain  Dressing or bathing? Y  Comment shoulder pain  Doing errands, shopping? N  Preparing Food and eating ? N  Using the Toilet? N  In the past six months, have you accidently leaked urine? N  Do you have problems with loss of bowel control? N  Managing your Medications? N  Managing your Finances? N  Housekeeping or managing your Housekeeping? N  Some recent data might be hidden    Patient Care Team: Gayland Curry, DO as PCP - General (Geriatric Medicine) Marica Otter, [place] as Consulting Physician (Optometry) Regal, Tamala Fothergill, DPM as Consulting Physician (Podiatry) Debara Pickett Nadean Corwin, MD as Consulting Physician (Cardiology)    Assessment:   This is a routine wellness examination for [facility].  Exercise Activities and Dietary recommendations Current Exercise Habits: Home exercise routine, Type of exercise: Other - see comments(paddling foot bike and deep water aerobics), Time (Minutes): 60, Frequency (Times/Week): 5, Weekly Exercise (Minutes/Week): 300, Intensity: Moderate, Exercise limited by: Other - see comments(knee,back shoulder pain)  Goals    . Increase physical activity     Starting 05/20/16, I will attempt to increase my physical activity.        Fall Risk Fall  Risk  07/17/2019 02/04/2019 10/11/2018 07/10/2018 03/29/2018  Falls in the past year? 0 0 0 0 0  Number falls in past yr: 0 - 0 0 0  Comment - - - - -  Injury with Fall? 0 - 0 0 0  Comment - - - - -  Risk for fall due to : - - - - -  Risk for fall due to: Comment - - - - -  Follow up - - - - -   Is the patient's home free of loose throw rugs in walkways, pet beds, electrical cords, etc?   no      Grab bars in the bathroom? no      Handrails on the stairs?   no      Adequate lighting?   yes  Depression Screen PHQ 2/9 Scores 07/17/2019 02/04/2019 10/11/2018 07/10/2018  PHQ - 2 Score 0 0 0 0     Cognitive Function MMSE - Mini Mental State Exam 07/17/2019 06/12/2017 05/20/2016 05/22/2015  Orientation to time 5 5 5 5   Orientation to Place 5 5 5 5   Registration 3 3 3 3   Attention/ Calculation 5 5 5 5   Recall 3 2 3 3   Language- name 2 objects 2 2 2 2   Language- repeat 1 1 1 1   Language- follow 3 step command 3 3 3 3   Language- read & follow direction 1 1 1 1   Write a sentence 1 1 1 1   Copy design 1 1 1 1   Total score 30 29 30 30      6CIT Screen 07/10/2018  What Year? 0 points  What month? 0 points  What time? 0 points  Count back from 20 0 points  Months in reverse 0 points  Repeat phrase 0 points  Total Score 0    Immunization History  Administered Date(s) Administered  . Fluad Quad(high Dose 65+) 11/30/2018  . Hepatitis A 11/13/1997, 06/07/2005  . Hepatitis B 09/11/1997, 10/09/1997, 03/26/1998  . IPV 09/11/1997  . Influenza, High Dose Seasonal PF 01/24/2017, 02/12/2018  . Influenza,inj,Quad PF,6+ Mos 01/28/2013, 05/16/2014, 11/19/2015  . Influenza-Unspecified 04/09/1998  . PFIZER SARS-COV-2 Vaccination 04/21/2019, 05/18/2019  . Pneumococcal Conjugate-13 11/14/2014  . Pneumococcal Polysaccharide-23 03/28/2008  . Pneumococcal-Unspecified 03/28/2004  . Td 10/09/1997, 09/04/2008  .  Tdap 02/01/2019  . Typhoid Parenteral 11/13/1997, 06/07/2005  . Zoster 05/27/2007  . Zoster  Recombinat (Shingrix) 04/04/2017, 06/05/2017    Qualifies for Shingles Vaccine?Yes   Screening Tests Health Maintenance  Topic Date Due  . INFLUENZA VACCINE  10/27/2019  . MAMMOGRAM  09/18/2020  . TETANUS/TDAP  01/31/2029  . DEXA SCAN  Completed  . COVID-19 Vaccine  Completed  . PNA vac Low Risk Adult  Completed    Cancer Screenings: Lung: Low Dose CT Chest recommended if Age 66-80 years, 30 pack-year currently smoking OR have quit w/in 15years. Patient does not qualify. Breast:  Up to date on Mammogram? Yes   Up to date of Bone Density/Dexa? Yes Colorectal:Age out   Additional Screenings:  Hepatitis C Screening: Low risk      Plan:    I have personally reviewed and noted the following in the patient's chart:   . Medical and social history . Use of alcohol, tobacco or illicit drugs  . Current medications and supplements . Functional ability and status . Nutritional status . Physical activity . Advanced directives . List of other physicians . Hospitalizations, surgeries, and ER visits in previous 12 months . Vitals . Screenings to include cognitive, depression, and falls . Referrals and appointments  In addition, I have reviewed and discussed with patient certain preventive protocols, quality metrics, and best practice recommendations. A written personalized care plan for preventive services as well as general preventive health recommendations were provided to patient.   Sandrea Hughs, NP  07/17/2019

## 2019-08-20 ENCOUNTER — Ambulatory Visit (INDEPENDENT_AMBULATORY_CARE_PROVIDER_SITE_OTHER): Payer: Medicare Other | Admitting: *Deleted

## 2019-08-20 ENCOUNTER — Other Ambulatory Visit: Payer: Self-pay

## 2019-08-20 DIAGNOSIS — E538 Deficiency of other specified B group vitamins: Secondary | ICD-10-CM | POA: Diagnosis not present

## 2019-08-20 DIAGNOSIS — M5136 Other intervertebral disc degeneration, lumbar region: Secondary | ICD-10-CM | POA: Diagnosis not present

## 2019-08-20 MED ORDER — CYANOCOBALAMIN 1000 MCG/ML IJ SOLN
1000.0000 ug | Freq: Once | INTRAMUSCULAR | Status: AC
  Administered 2019-08-20: 1000 ug via INTRAMUSCULAR

## 2019-09-20 ENCOUNTER — Other Ambulatory Visit: Payer: Self-pay

## 2019-09-20 ENCOUNTER — Ambulatory Visit: Payer: Medicare Other

## 2019-09-20 ENCOUNTER — Ambulatory Visit (INDEPENDENT_AMBULATORY_CARE_PROVIDER_SITE_OTHER): Payer: Medicare Other | Admitting: *Deleted

## 2019-09-20 DIAGNOSIS — E538 Deficiency of other specified B group vitamins: Secondary | ICD-10-CM | POA: Diagnosis not present

## 2019-09-20 MED ORDER — CYANOCOBALAMIN 1000 MCG/ML IJ SOLN
1000.0000 ug | Freq: Once | INTRAMUSCULAR | Status: AC
  Administered 2019-09-20: 1000 ug via INTRAMUSCULAR

## 2019-09-23 LAB — HM MAMMOGRAPHY

## 2019-09-27 ENCOUNTER — Telehealth: Payer: Self-pay

## 2019-09-27 ENCOUNTER — Encounter: Payer: Self-pay | Admitting: Internal Medicine

## 2019-10-01 ENCOUNTER — Telehealth: Payer: Self-pay

## 2019-10-01 MED ORDER — CELECOXIB 100 MG PO CAPS: 100.0000 mg | ORAL_CAPSULE | Freq: Two times a day (BID) | ORAL | 4 refills | Status: DC

## 2019-10-01 NOTE — Telephone Encounter (Signed)
Patient walked in upset stating her pharmacy called her yesterday stating they have sent 3 faxes with no response.  I informed patient that I have a verbal conversation with Evie prior to her walking in (conversation was prompted from [place] message sent by patient) and Bushnell Sink stated she called rx in on Friday. Patient asked for documentation and I was unable to provide that for her at the time of her walking in (Evie was in the process of documented for rx request came over late in the day on Friday)  Patient states she sent a message at 4:40ish on Friday and she is upset that no one replied.   I informed patient that the [employer] system is a non-urgent system and anytime she needs an immediate reply she will need to call the office. Patient aware that I received 2 faxed from [facility] who is partners with [facility]. Faxes were received out of business hours.   Patient asked that I fax and give her a copy of fax for documentation since I was unable to provide her with documentation from Evie calling rx in. I completed form and faxed to [phone]. I also received confirmation that RX went through. Patient was provided with a copy of fax and confirmation page.

## 2019-10-01 NOTE — Telephone Encounter (Signed)
I had a verbal conversation with Jody [name] and she called RX in to [facility]

## 2019-10-01 NOTE — Telephone Encounter (Signed)
Medication was called in to [facility] on 09/27/2019. Had to leave message on voicemail. Prescription could not be sent electronically.

## 2019-10-10 ENCOUNTER — Other Ambulatory Visit: Payer: Self-pay

## 2019-10-10 ENCOUNTER — Ambulatory Visit (INDEPENDENT_AMBULATORY_CARE_PROVIDER_SITE_OTHER): Payer: Medicare Other | Admitting: Internal Medicine

## 2019-10-10 ENCOUNTER — Encounter: Payer: Self-pay | Admitting: Internal Medicine

## 2019-10-10 VITALS — BP 122/72 | HR 69 | Temp 97.7°F | Ht 65.0 in | Wt 174.0 lb

## 2019-10-10 DIAGNOSIS — I10 Essential (primary) hypertension: Secondary | ICD-10-CM | POA: Diagnosis not present

## 2019-10-10 DIAGNOSIS — R200 Anesthesia of skin: Secondary | ICD-10-CM | POA: Diagnosis not present

## 2019-10-10 DIAGNOSIS — H547 Unspecified visual loss: Secondary | ICD-10-CM

## 2019-10-10 DIAGNOSIS — M47812 Spondylosis without myelopathy or radiculopathy, cervical region: Secondary | ICD-10-CM | POA: Diagnosis not present

## 2019-10-10 NOTE — Patient Instructions (Signed)
Go back to see Dr. Prudencio Burly at [facility]

## 2019-10-10 NOTE — Progress Notes (Signed)
Location:  [facility] clinic Provider:  Mykaylah Ballman L. Mariea Clonts, D.O., C.M.D.  Goals of Care:  Advanced Directives 10/10/2019  Does Patient Have a Medical Advance Directive? Yes  Type of Advance Directive [place]  Does patient want to make changes to medical advance directive? No - Patient declined  Copy of [place] in Chart? Yes - validated most recent copy scanned in chart (See row information)  Would patient like information on creating a medical advance directive? -     Chief Complaint  Patient presents with  . Medical Management of Chronic Issues    6 month follow up     HPI: Patient is a 79 y.o. female seen today for medical management of chronic diseases.    She's gone to Dr. Marica Otter the past several years.  She has not had resolution of her acuity when she's had  When she got back from the beach last year, she went to ophtho--Rx was the same as what she recommended. Vision varies--very clear sometimes, out of focus other times.  Reads and scrolls and gets news all on her phone.  It feels like there is something in her right eye (?eyelid drooping after a while).  Neither doctor thought that was the issue.  Has to adjust how she's looking at something to get a clear image.  No eye pain.  No redness.  No diplopia.  Cannot find a sweet spot.  No issues seeing things in her periphery.  Really notices more with reading.  She did have surgery a couple of years ago with [facility], [employer], after she had a wrinkle in her retina.    BP will rarely be 140/90.  She's taking her lisinopril 5mg  each am.  Checks bp again at night.  Does not add any lisinopril b/c normally in range.  Last night it was 179 and she'd been scrapbooking.  She did take 5mg  extra last night.  This morning it was below 100 after that.    Past Medical History:  Diagnosis Date  . Anemia, unspecified   . B12 deficiency due to diet   . Cramp of limb   . Edema of both legs     . Encounter for long-term (current) use of other medications   . Lumbago   . Obesity   . Osteoarthrosis, unspecified whether generalized or localized, unspecified site   . Restless legs syndrome (RLS)   . Senile osteoporosis     Past Surgical History:  Procedure Laterality Date  . CATARACT EXTRACTION W/ INTRAOCULAR LENS  IMPLANT, BILATERAL  04/2014  . 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 [place]  2005  . [place]   stapleing   . VITRECTOMY Right    with membrane peeling for a macular pucker    Allergies  Allergen Reactions  . Prolia [Denosumab] Other (See Comments)    Chest pain  . Percocet [Oxycodone-Acetaminophen]     Confusion   . Shrimp [Shellfish Allergy]     All seafood    Outpatient Encounter Medications as of 10/10/2019  Medication Sig  . Calcium Carbonate-Vit D-Min (CALCIUM 1200 PO) Take 1 tablet by mouth daily.  . celecoxib (CELEBREX) 100 MG capsule Take 1 capsule (100 mg total) by mouth 2 (two) times daily.  . Cholecalciferol (VITAMIN D) 2000 UNITS tablet Take 2,000 Units by mouth daily.  . Cyanocobalamin (B-12 COMPLIANCE INJECTION IJ) Inject 1 mL as directed every 30 (thirty) days.  [name]  diphenhydramine-acetaminophen (TYLENOL PM) 25-500 MG TABS tablet Take 1 tablet by mouth at bedtime as needed.  . gabapentin (NEURONTIN) 300 MG capsule Take 300 mg by mouth 3 (three) times daily.  . Iron 66 MG TABS Take 1 tablet by mouth daily.   [name] lisinopril (ZESTRIL) 5 MG tablet Take 1 tablet (5 mg total) by mouth daily as needed (BP over 140/90). Take 1 additional tablet by mouth if your blood pressure is 150/90 in the afternoon  . Multiple Vitamin (MULTIVITAMIN) tablet Take 1 tablet by mouth daily.   No facility-administered encounter medications on file as of 10/10/2019.    Review of Systems:  Review of Systems  Constitutional: Negative for chills and fever.  HENT: Negative for congestion and sore throat.   Eyes: Positive for blurred vision.  Negative for double vision, photophobia, pain, discharge and redness.  Respiratory: Negative for cough and shortness of breath.   Cardiovascular: Negative for chest pain and palpitations.  Gastrointestinal: Negative for abdominal pain, blood in stool, constipation and melena.  Genitourinary: Negative for dysuria.  Musculoskeletal: Positive for neck pain. Negative for falls and joint pain.  Neurological: Positive for sensory change. Negative for loss of consciousness.       Left arm numbness  Psychiatric/Behavioral: Negative for depression and memory loss. The patient is not nervous/anxious and does not have insomnia.     Health Maintenance  Topic Date Due  . INFLUENZA VACCINE  10/27/2019  . MAMMOGRAM  09/18/2020  . TETANUS/TDAP  01/31/2029  . DEXA SCAN  Completed  . COVID-19 Vaccine  Completed  . PNA vac Low Risk Adult  Completed    Physical Exam: Vitals:   10/10/19 1458  BP: 122/72  Pulse: 69  Temp: 97.7 F (36.5 C)  TempSrc: Temporal  SpO2: 97%  Weight: 174 lb (78.9 kg)  Height: 5\' 5"  (1.651 m)   Body mass index is 28.96 kg/m. Physical Exam Vitals reviewed.  Constitutional:      General: She is not in acute distress.    Appearance: Normal appearance. She is obese. She is not ill-appearing or toxic-appearing.  Cardiovascular:     Rate and Rhythm: Normal rate and regular rhythm.  Pulmonary:     Effort: Pulmonary effort is normal.     Breath sounds: Normal breath sounds. No wheezing, rhonchi or rales.  Abdominal:     General: Bowel sounds are normal.  Musculoskeletal:        General: Normal range of motion.     Right lower leg: No edema.     Left lower leg: No edema.  Skin:    General: Skin is warm and dry.  Neurological:     General: No focal deficit present.     Mental Status: She is alert and oriented to person, place, and time.     Sensory: Sensory deficit (numbness of left thumb intermittent) present.     Motor: No weakness.     Gait: Gait normal.    Psychiatric:        Mood and Affect: Mood normal.        Behavior: Behavior normal.        Thought Content: Thought content normal.        Judgment: Judgment normal.     Labs reviewed: Basic Metabolic Panel: Recent Labs    02/15/19 1043  NA 141  K 4.7  CL 107  CO2 24  GLUCOSE 62*  BUN 22  CREATININE 1.19*  CALCIUM 9.4   Liver Function Tests: No  results for input(s): AST, ALT, ALKPHOS, BILITOT, PROT, ALBUMIN in the last 8760 hours. No results for input(s): LIPASE, AMYLASE in the last 8760 hours. No results for input(s): AMMONIA in the last 8760 hours. CBC: No results for input(s): WBC, NEUTROABS, HGB, HCT, MCV, PLT in the last 8760 hours. Lipid Panel: No results for input(s): CHOL, HDL, LDLCALC, TRIG, CHOLHDL, LDLDIRECT in the last 8760 hours. Lab Results  Component Value Date   HGBA1C 5.6 03/04/2013    Assessment/Plan 1. 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 Left arm numbness -seems related to her neck -had prior full negative cardiac workup after this happened with her prolia injection -she tells me today about how she "fell out of the attic" and had injured her neck though she thought she was fine and neuroimaging showed she had some fusion of her lower cervical spine that had developed later on   3. Cervical spondylosis without myelopathy -see #2, seems symptoms are only in her left arm, neck, jaw which is disturbing, but cardiac workup historically negative  4. Benign essential hypertension -bp typically well controlled with current regimen, cont same and monitor  Labs/tests ordered:  No new  added today Next appt:  F/u when returns from her vacation and continue her b12 injections  LIkes 90 days with 3 refills for her celebrex which is from [place]  Shelton Soler L. Tamica Covell, D.O. [facility] [address], [place] [address] Cell Phone (Mon-Fri 8am-5pm):  [phone] On Call:  [phone] & follow prompts after 5pm & weekends Office Phone:  [phone] Office Fax:  [phone]

## 2019-10-30 ENCOUNTER — Other Ambulatory Visit: Payer: Self-pay

## 2019-10-30 ENCOUNTER — Ambulatory Visit (INDEPENDENT_AMBULATORY_CARE_PROVIDER_SITE_OTHER): Payer: Medicare Other

## 2019-10-30 DIAGNOSIS — E538 Deficiency of other specified B group vitamins: Secondary | ICD-10-CM

## 2019-10-30 MED ORDER — CYANOCOBALAMIN 1000 MCG/ML IJ SOLN
1000.0000 ug | Freq: Once | INTRAMUSCULAR | Status: AC
  Administered 2019-10-30: 1000 ug via INTRAMUSCULAR

## 2019-11-14 ENCOUNTER — Other Ambulatory Visit: Payer: Self-pay

## 2019-11-14 ENCOUNTER — Ambulatory Visit (INDEPENDENT_AMBULATORY_CARE_PROVIDER_SITE_OTHER): Payer: Medicare Other | Admitting: Internal Medicine

## 2019-11-14 ENCOUNTER — Encounter: Payer: Self-pay | Admitting: Internal Medicine

## 2019-11-14 VITALS — BP 110/62 | HR 79 | Temp 97.8°F | Ht 62.0 in | Wt 163.4 lb

## 2019-11-14 DIAGNOSIS — D538 Other specified nutritional anemias: Secondary | ICD-10-CM | POA: Diagnosis not present

## 2019-11-14 DIAGNOSIS — E785 Hyperlipidemia, unspecified: Secondary | ICD-10-CM | POA: Diagnosis not present

## 2019-11-14 DIAGNOSIS — E1169 Type 2 diabetes mellitus with other specified complication: Secondary | ICD-10-CM | POA: Diagnosis not present

## 2019-11-14 DIAGNOSIS — R42 Dizziness and giddiness: Secondary | ICD-10-CM | POA: Diagnosis not present

## 2019-11-14 DIAGNOSIS — E538 Deficiency of other specified B group vitamins: Secondary | ICD-10-CM | POA: Diagnosis not present

## 2019-11-14 DIAGNOSIS — E7800 Pure hypercholesterolemia, unspecified: Secondary | ICD-10-CM | POA: Diagnosis not present

## 2019-11-14 DIAGNOSIS — M810 Age-related osteoporosis without current pathological fracture: Secondary | ICD-10-CM

## 2019-11-14 DIAGNOSIS — R5383 Other fatigue: Secondary | ICD-10-CM | POA: Diagnosis not present

## 2019-11-14 DIAGNOSIS — H547 Unspecified visual loss: Secondary | ICD-10-CM | POA: Diagnosis not present

## 2019-11-14 DIAGNOSIS — R739 Hyperglycemia, unspecified: Secondary | ICD-10-CM | POA: Diagnosis not present

## 2019-11-14 NOTE — Progress Notes (Signed)
Location:  [facility] clinic Provider:  Verline Kong L. Mariea Clonts, D.O., C.M.D.  Goals of Care:  Advanced Directives 11/14/2019  Does Patient Have a Medical Advance Directive? Yes  Type of Advance Directive [place]  Does patient want to make changes to medical advance directive? No - Patient declined  Copy of [place] in Chart? -  Would patient like information on creating a medical advance directive? -     Chief Complaint  Patient presents with  . Acute Visit    patient does not feel right, fatigue and feeling funny light headed, shaky and uncertain or sure of being able to complete task.     HPI: Patient is a 79 y.o. female seen today for acute visit due to profound fatigue for about 3 weeks.  At first, it didn't register.  For the last week, she has really paid attention to it.  She is having to sit down b/w tasks.  She chose not to do things she intended to do.  That's not her personality to put things off.  She's had low hgb before and felt this way when that was the case.  Has lost 11 lbs since last visit mid-June.    She's been driving her unvaccinated daughter around to competitions.  She's been wearing her mask.    Gets her B12.    Goes to [facility] not this but next weekend for a few months.    She's also been shaky and unsure.  When she stands, she does not feel confident that she can step off and do what she needs to.  Might be lightheaded.  Needs to stand a second.  Always uses a cane outside.  She fell when didn't.  She's having to pause and move slower.  She does not use her cane in the house--furniture/wall surfs.    BP has been normal range when she's checked it.  Had been doing bid bps.  Lately she's taking her lisinopril 5mg  each morning.  If over 140 or 90, she takes a second.  Not needed in a long time.    She wonders about blood sugar also.  Has family h/o DMII.  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  Says 2/3 of her diet is something  sweet.    She normally will walk around the waiting room while waiting for me, but she sat right down.  Her cats are happy at home b/c she's sitting.  She stood her whole career and lately she's been sitting a lot.  Next eye visit is not until November.  Still as intermittent difficulty focusing.    She is not depressed, but admits to sadness after the past couple of years with circumstances (politically, in this country and world).    Past Medical History:  Diagnosis Date  . Anemia, unspecified   . B12 deficiency due to diet   . Cramp of limb   . Edema of both legs   . Encounter for long-term (current) use of other medications   . Lumbago   . Obesity   . Osteoarthrosis, unspecified whether generalized or localized, unspecified site   . Restless legs syndrome (RLS)   . Senile osteoporosis     Past Surgical History:  Procedure Laterality Date  . CATARACT EXTRACTION W/ INTRAOCULAR LENS  IMPLANT, BILATERAL  04/2014  . 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 [place]  2005  . [place]   stapleing   .  VITRECTOMY Right    with membrane peeling for a macular pucker    Allergies  Allergen Reactions  . Prolia [Denosumab] Other (See Comments)    Chest pain  . Percocet [Oxycodone-Acetaminophen]     Confusion   . Shrimp [Shellfish Allergy]     All seafood    Outpatient Encounter Medications as of 11/14/2019  Medication Sig  . Calcium Carbonate-Vit D-Min (CALCIUM 1200 PO) Take 1 tablet by mouth daily.  . celecoxib (CELEBREX) 100 MG capsule Take 1 capsule (100 mg total) by mouth 2 (two) times daily.  . Cholecalciferol (VITAMIN D) 2000 UNITS tablet Take 2,000 Units by mouth daily.  . Cyanocobalamin (B-12 COMPLIANCE INJECTION IJ) Inject 1 mL as directed every 30 (thirty) days.  . diphenhydramine-acetaminophen (TYLENOL PM) 25-500 MG TABS tablet Take 1 tablet by mouth at bedtime as needed.  . gabapentin (NEURONTIN) 300 MG capsule Take 300 mg by mouth 3 (three)  times daily.  . Iron 66 MG TABS Take 1 tablet by mouth daily.   [name] lisinopril (ZESTRIL) 5 MG tablet Take 1 tablet (5 mg total) by mouth daily as needed (BP over 140/90). Take 1 additional tablet by mouth if your blood pressure is 150/90 in the afternoon  . Multiple Vitamin (MULTIVITAMIN) tablet Take 1 tablet by mouth daily.   No facility-administered encounter medications on file as of 11/14/2019.    Review of Systems:  Review of Systems  Constitutional: Positive for malaise/fatigue and weight loss. Negative for chills and fever.  HENT: Negative for congestion and sore throat.   Eyes: Negative for blurred vision.       Glasses  Respiratory: Negative for cough and shortness of breath.   Cardiovascular: Negative for chest pain, palpitations and leg swelling.  Gastrointestinal: Negative for abdominal pain, blood in stool, constipation, diarrhea and melena.  Genitourinary: Negative for dysuria.  Musculoskeletal: Positive for back pain, falls and joint pain.  Skin: Negative for itching and rash.  Neurological: Positive for tingling and sensory change. Negative for dizziness and loss of consciousness.  Psychiatric/Behavioral: Negative for depression and memory loss. The patient is not nervous/anxious and does not have insomnia.     Health Maintenance  Topic Date Due  . INFLUENZA VACCINE  10/27/2019  . MAMMOGRAM  09/22/2021  . TETANUS/TDAP  01/31/2029  . DEXA SCAN  Completed  . COVID-19 Vaccine  Completed  . PNA vac Low Risk Adult  Completed    Physical Exam: Vitals:   11/14/19 0835  BP: 118/62  Pulse: 79  Temp: 97.8 F (36.6 C)  SpO2: 97%  Weight: 163 lb 6.4 oz (74.1 kg)  Height: 5\' 5"  (1.651 m)   Body mass index is 27.19 kg/m. Physical Exam Vitals reviewed.  Constitutional:      General: She is not in acute distress.    Appearance: Normal appearance. She is not toxic-appearing.  HENT:     Head: Normocephalic and atraumatic.  Eyes:     Extraocular Movements: Extraocular  movements intact.     Pupils: Pupils are equal, round, and reactive to light.  Cardiovascular:     Rate and Rhythm: Normal rate and regular rhythm.     Pulses: Normal pulses.     Heart sounds: Normal heart sounds.  Pulmonary:     Effort: Pulmonary effort is normal.     Breath sounds: No wheezing, rhonchi or rales.  Abdominal:     Palpations: Abdomen is soft.  Musculoskeletal:        General: Normal range of motion.  Cervical back: Neck supple.     Right lower leg: No edema.     Left lower leg: No edema.     Comments: Unsteady gait, uses cane  Skin:    General: Skin is warm and dry.  Neurological:     General: No focal deficit present.     Mental Status: She is alert and oriented to person, place, and time.     Gait: Gait normal.  Psychiatric:        Mood and Affect: Mood normal.     Labs reviewed: Basic Metabolic Panel: Recent Labs    02/15/19 1043  NA 141  K 4.7  CL 107  CO2 24  GLUCOSE 62*  BUN 22  CREATININE 1.19*  CALCIUM 9.4   Liver Function Tests: No results for input(s): AST, ALT, ALKPHOS, BILITOT, PROT, ALBUMIN in the last 8760 hours. No results for input(s): LIPASE, AMYLASE in the last 8760 hours. No results for input(s): AMMONIA in the last 8760 hours. CBC: No results for input(s): WBC, NEUTROABS, HGB, HCT, MCV, PLT in the last 8760 hours. Lipid Panel: No results for input(s): CHOL, HDL, LDLCALC, TRIG, CHOLHDL, LDLDIRECT in the last 8760 hours. Lab Results  Component Value Date   HGBA1C 5.6 03/04/2013    Procedures since last visit: No results found.  Assessment/Plan 1. B12 deficiency due to diet - gets b12 injections here -f/u Vitamin B12 level  2. Decreased visual acuity -continues to be an issue--has been seen several times w/o clear cause -has next visit in Nov  3. Other fatigue - will check her labs to see if she's very anemic, hypothyroid - TSH - Vitamin B12  4. Senile osteoporosis - check calcium levels, cont calcium with D  and additional D3 -did evenity for a year w/o improvement in bone density - COMPLETE METABOLIC PANEL WITH GFR  5. Other specified nutritional anemias - ongoing poor eating habits - CBC with Differential/Platelet - COMPLETE METABOLIC PANEL WITH GFR - Vitamin B12  6. Hyperlipidemia  - f/u labs - Lipid panel  7. Hyperglycemia -admits to eating too many sweets -f/u labs: - Hemoglobin A1c  8. Positional lightheadedness - Orthostatic vital signs were negative for orthostasis -bps at home wnl -encouraged better hydration with water  Labs/tests ordered:  Lab Orders     CBC with Differential/Platelet     COMPLETE METABOLIC PANEL WITH GFR     Hemoglobin A1c     Lipid panel     TSH     Vitamin B12 Will need call with results  Next appt:  F/u when return from [place]. Sahas Sluka, D.O. [facility] [address], [place] [address] Cell Phone (Mon-Fri 8am-5pm):  [phone] On Call:  [phone] & follow prompts after 5pm & weekends Office Phone:  [phone] Office Fax:  [phone]

## 2019-11-15 LAB — CBC WITH DIFFERENTIAL/PLATELET
Absolute Monocytes: 505 cells/uL (ref 200–950)
Basophils Absolute: 29 cells/uL (ref 0–200)
Basophils Relative: 0.5 %
Eosinophils Absolute: 197 cells/uL (ref 15–500)
Eosinophils Relative: 3.4 %
HCT: 36 % (ref 35.0–45.0)
Hemoglobin: 11.9 g/dL (ref 11.7–15.5)
Lymphs Abs: 1578 cells/uL (ref 850–3900)
MCH: 31 pg (ref 27.0–33.0)
MCHC: 33.1 g/dL (ref 32.0–36.0)
MCV: 93.8 fL (ref 80.0–100.0)
MPV: 10.1 fL (ref 7.5–12.5)
Monocytes Relative: 8.7 %
Neutro Abs: 3492 cells/uL (ref 1500–7800)
Neutrophils Relative %: 60.2 %
Platelets: 312 10*3/uL (ref 140–400)
RBC: 3.84 10*6/uL (ref 3.80–5.10)
RDW: 11.8 % (ref 11.0–15.0)
Total Lymphocyte: 27.2 %
WBC: 5.8 10*3/uL (ref 3.8–10.8)

## 2019-11-15 LAB — COMPLETE METABOLIC PANEL WITH GFR
AG Ratio: 1.8 (calc) (ref 1.0–2.5)
ALT: 17 U/L (ref 6–29)
AST: 30 U/L (ref 10–35)
Albumin: 4.5 g/dL (ref 3.6–5.1)
Alkaline phosphatase (APISO): 48 U/L (ref 37–153)
BUN/Creatinine Ratio: 20 (calc) (ref 6–22)
BUN: 25 mg/dL (ref 7–25)
CO2: 26 mmol/L (ref 20–32)
Calcium: 9.7 mg/dL (ref 8.6–10.4)
Chloride: 105 mmol/L (ref 98–110)
Creat: 1.24 mg/dL — ABNORMAL HIGH (ref 0.60–0.88)
GFR, Est African American: 47 mL/min/{1.73_m2} — ABNORMAL LOW (ref >=60)
GFR, Est Non African American: 41 mL/min/{1.73_m2} — ABNORMAL LOW (ref >=60)
Globulin: 2.5 g/dL (calc) (ref 1.9–3.7)
Glucose, Bld: 106 mg/dL — ABNORMAL HIGH (ref 65–99)
Potassium: 4.7 mmol/L (ref 3.5–5.3)
Sodium: 138 mmol/L (ref 135–146)
Total Bilirubin: 0.5 mg/dL (ref 0.2–1.2)
Total Protein: 7 g/dL (ref 6.1–8.1)

## 2019-11-15 LAB — TSH: TSH: 3.2 mIU/L (ref 0.40–4.50)

## 2019-11-15 LAB — HEMOGLOBIN A1C
Hgb A1c MFr Bld: 5.5 % of total Hgb (ref <5.7)
Mean Plasma Glucose: 111 (calc)
eAG (mmol/L): 6.2 (calc)

## 2019-11-15 LAB — LIPID PANEL
Cholesterol: 160 mg/dL (ref <200)
HDL: 62 mg/dL (ref >=50)
LDL Cholesterol (Calc): 79 mg/dL (calc)
Non-HDL Cholesterol (Calc): 98 mg/dL (calc) (ref <130)
Total CHOL/HDL Ratio: 2.6 (calc) (ref <5.0)
Triglycerides: 111 mg/dL (ref <150)

## 2019-11-15 LAB — VITAMIN B12: Vitamin B-12: 1613 pg/mL — ABNORMAL HIGH (ref 200–1100)

## 2019-11-15 NOTE — Progress Notes (Signed)
The only thing off on her labs is that she is not drinking enough water.  I need her to get getting in 48-64oz per day.  Dehydration can cause fatigue and definitely lightheadedness.   She's not anemic right now and b12 is fine.   Thyroid was fine Cholesterol was the best it's been in a long time.

## 2019-11-19 NOTE — Progress Notes (Signed)
Outside of improving her eating habits to make healthier choices, I don't really have many more suggestions.  Her advancing age may also have something to do with her fatigue/malaise.  If she develops difficulty with shortness of breath or other new symptoms, we may have more to sort through.

## 2020-01-08 DIAGNOSIS — Z23 Encounter for immunization: Secondary | ICD-10-CM | POA: Diagnosis not present

## 2020-01-30 ENCOUNTER — Other Ambulatory Visit: Payer: Self-pay

## 2020-01-30 ENCOUNTER — Ambulatory Visit (INDEPENDENT_AMBULATORY_CARE_PROVIDER_SITE_OTHER): Payer: Medicare Other | Admitting: Internal Medicine

## 2020-01-30 ENCOUNTER — Encounter: Payer: Self-pay | Admitting: Internal Medicine

## 2020-01-30 VITALS — BP 122/78 | HR 80 | Temp 97.7°F | Ht 62.0 in | Wt 152.2 lb

## 2020-01-30 DIAGNOSIS — R0989 Other specified symptoms and signs involving the circulatory and respiratory systems: Secondary | ICD-10-CM

## 2020-01-30 DIAGNOSIS — R198 Other specified symptoms and signs involving the digestive system and abdomen: Secondary | ICD-10-CM | POA: Diagnosis not present

## 2020-01-30 DIAGNOSIS — R09A2 Foreign body sensation, throat: Secondary | ICD-10-CM

## 2020-01-30 DIAGNOSIS — E538 Deficiency of other specified B group vitamins: Secondary | ICD-10-CM

## 2020-01-30 DIAGNOSIS — M19041 Primary osteoarthritis, right hand: Secondary | ICD-10-CM | POA: Diagnosis not present

## 2020-01-30 DIAGNOSIS — R490 Dysphonia: Secondary | ICD-10-CM

## 2020-01-30 DIAGNOSIS — K219 Gastro-esophageal reflux disease without esophagitis: Secondary | ICD-10-CM | POA: Diagnosis not present

## 2020-01-30 DIAGNOSIS — M5416 Radiculopathy, lumbar region: Secondary | ICD-10-CM | POA: Diagnosis not present

## 2020-01-30 MED ORDER — OMEPRAZOLE 20 MG PO CPDR: 20.0000 mg | DELAYED_RELEASE_CAPSULE | Freq: Every day | ORAL | 3 refills | Status: DC

## 2020-01-30 MED ORDER — CYANOCOBALAMIN 1000 MCG/ML IJ SOLN
1000.0000 ug | Freq: Once | INTRAMUSCULAR | Status: AC
  Administered 2020-01-30: 1000 ug via INTRAMUSCULAR

## 2020-01-30 MED ORDER — CELECOXIB 100 MG PO CAPS: 100.0000 mg | ORAL_CAPSULE | Freq: Two times a day (BID) | ORAL | 4 refills | Status: DC

## 2020-01-30 NOTE — Progress Notes (Signed)
Location:  [facility] clinic Provider: Gladine Plude L. Mariea Clonts, D.O., C.M.D.  Goals of Care:  Advanced Directives 11/14/2019  Does Patient Have a Medical Advance Directive? Yes  Type of Advance Directive [place]  Does patient want to make changes to medical advance directive? No - Patient declined  Copy of [place] in Chart? -  Would patient like information on creating a medical advance directive? -   Chief Complaint  Patient presents with  . Acute Visit    Congestion issues and B12 injection, New script for celebrex for pharmacy in [place]     HPI: Patient is a 79 y.o. female seen today for an acute visit for congestion issues.  For the last two months she was at the beach.  She can't tell why it happens or what brings it on.  Feels like there is a glob of phlegm at the base of her throat that won't move.  She's constantly trying to clear her throat.  Sometimes when she blows her nose, she thinks that's connected.  Thinks it started since before the beach in august.  When it's really bad, she'll take a nap on her left side, when she wakes up, it's gone or doesn't bother her.  She stopped smoking around 1976.  Has not had trouble with allergies that she's aware of.  She does have indigestion though she does not take anything for it.  Bothers her at night.  Sleeps on left.  Sometimes her voice changes like she's whispering.    Had b12 injection.    Asks if amantadine could be used for her like it was for her granddog who also takes gabapentin, but she has no indication.  The dog apparently was full of energy after taking amantadine.    Hands not hurting and hip and leg not hurting with her celebrex and gabapentin respectively.    She is down 11 lbs since august.  She has reduced her coffee with creamer.    Past Medical History:  Diagnosis Date  . Anemia, unspecified   . B12 deficiency due to diet   . Cramp of limb   . Edema of both legs   . Encounter for  long-term (current) use of other medications   . Lumbago   . Obesity   . Osteoarthrosis, unspecified whether generalized or localized, unspecified site   . Restless legs syndrome (RLS)   . Senile osteoporosis     Past Surgical History:  Procedure Laterality Date  . CATARACT EXTRACTION W/ INTRAOCULAR LENS  IMPLANT, BILATERAL  04/2014  . 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 [place]  2005  . [place]   stapleing   . VITRECTOMY Right    with membrane peeling for a macular pucker    Allergies  Allergen Reactions  . Prolia [Denosumab] Other (See Comments)    Chest pain  . Percocet [Oxycodone-Acetaminophen]     Confusion   . Shrimp [Shellfish Allergy]     All seafood    Outpatient Encounter Medications as of 01/30/2020  Medication Sig  . Calcium Carbonate-Vit D-Min (CALCIUM 1200 PO) Take 1 tablet by mouth daily.  . celecoxib (CELEBREX) 100 MG capsule Take 1 capsule (100 mg total) by mouth 2 (two) times daily.  . Cholecalciferol (VITAMIN D) 2000 UNITS tablet Take 2,000 Units by mouth daily.  . Cyanocobalamin (B-12 COMPLIANCE INJECTION IJ) Inject 1 mL as directed every 30 (thirty) days.  . diphenhydramine-acetaminophen (TYLENOL PM)  25-500 MG TABS tablet Take 1 tablet by mouth at bedtime as needed.  . gabapentin (NEURONTIN) 300 MG capsule Take 300 mg by mouth 3 (three) times daily.  . Iron 66 MG TABS Take 1 tablet by mouth daily.   [name] lisinopril (ZESTRIL) 5 MG tablet Take 1 tablet (5 mg total) by mouth daily as needed (BP over 140/90). Take 1 additional tablet by mouth if your blood pressure is 150/90 in the afternoon  . Multiple Vitamin (MULTIVITAMIN) tablet Take 1 tablet by mouth daily.  . [DISCONTINUED] celecoxib (CELEBREX) 100 MG capsule Take 1 capsule (100 mg total) by mouth 2 (two) times daily.  . [EXPIRED] cyanocobalamin ((VITAMIN B-12)) injection 1,000 mcg    No facility-administered encounter medications on file as of 01/30/2020.    Review of  Systems:  Review of Systems  Constitutional: Negative for chills, fever and malaise/fatigue.  HENT: Positive for congestion. Negative for sore throat.   Eyes: Negative for blurred vision.  Respiratory: Positive for cough. Negative for sputum production and shortness of breath.   Cardiovascular: Negative for chest pain, palpitations and leg swelling.  Gastrointestinal: Positive for heartburn. Negative for abdominal pain, blood in stool, constipation, diarrhea, melena, nausea and vomiting.  Genitourinary: Negative for dysuria.  Musculoskeletal: Positive for back pain. Negative for falls.  Skin: Negative for itching and rash.  Neurological: Positive for tingling and sensory change. Negative for dizziness, loss of consciousness and weakness.  Psychiatric/Behavioral: Negative for depression and memory loss. The patient is not nervous/anxious and does not have insomnia.     Health Maintenance  Topic Date Due  . MAMMOGRAM  09/22/2021  . TETANUS/TDAP  01/31/2029  . INFLUENZA VACCINE  Completed  . DEXA SCAN  Completed  . COVID-19 Vaccine  Completed  . PNA vac Low Risk Adult  Completed    Physical Exam: Vitals:   01/30/20 1420  BP: 122/78  Pulse: 80  Temp: 97.7 F (36.5 C)  TempSrc: Temporal  SpO2: 98%  Weight: 152 lb 3.2 oz (69 kg)  Height: 5\' 2"  (1.575 m)   Body mass index is 27.84 kg/m. Physical Exam Vitals reviewed.  Constitutional:      General: She is not in acute distress.    Appearance: Normal appearance. She is not toxic-appearing.  HENT:     Head: Normocephalic and atraumatic.     Right Ear: Tympanic membrane, ear canal and external ear normal.     Left Ear: Tympanic membrane, ear canal and external ear normal.     Nose: Nose normal. No congestion.     Mouth/Throat:     Pharynx: Oropharynx is clear. No oropharyngeal exudate.  Eyes:     Conjunctiva/sclera: Conjunctivae normal.  Cardiovascular:     Rate and Rhythm: Normal rate and regular rhythm.     Pulses:  Normal pulses.     Heart sounds: Normal heart sounds.  Pulmonary:     Effort: Pulmonary effort is normal.     Breath sounds: Normal breath sounds.  Abdominal:     General: Bowel sounds are normal. There is no distension.     Palpations: Abdomen is soft.     Tenderness: There is no abdominal tenderness.  Musculoskeletal:        General: Normal range of motion.     Right lower leg: No edema.     Left lower leg: No edema.  Neurological:     General: No focal deficit present.     Mental Status: She is alert and oriented to person,  place, and time.     Labs reviewed: Basic Metabolic Panel: Recent Labs    02/15/19 1043 11/14/19 0937  NA 141 138  K 4.7 4.7  CL 107 105  CO2 24 26  GLUCOSE 62* 106*  BUN 22 25  CREATININE 1.19* 1.24*  CALCIUM 9.4 9.7  TSH  --  3.20   Liver Function Tests: Recent Labs    11/14/19 0937  AST 30  ALT 17  BILITOT 0.5  PROT 7.0   No results for input(s): LIPASE, AMYLASE in the last 8760 hours. No results for input(s): AMMONIA in the last 8760 hours. CBC: Recent Labs    11/14/19 0937  WBC 5.8  NEUTROABS 3,492  HGB 11.9  HCT 36.0  MCV 93.8  PLT 312   Lipid Panel: Recent Labs    11/14/19 0937  CHOL 160  HDL 62  LDLCALC 79  TRIG 111  CHOLHDL 2.6   Lab Results  Component Value Date   HGBA1C 5.5 11/14/2019    Procedures since last visit: No results found.  Assessment/Plan 1. Hoarseness of voice -suspect due to gerd, try PPI and call back if not improving--may be more postnasal drip/sputum instead and would need low dose zyrtec or similar if PPI not helping this (would stop if no better b/c of her already thin bones)  2. Globus sensation -suspect due to gerd - omeprazole (PRILOSEC) 20 MG capsule; Take 1 capsule (20 mg total) by mouth daily. Before biggest meal  Dispense: 30 capsule; Refill: 3  3. Gastroesophageal reflux disease, unspecified whether esophagitis present -start omeprazole--appears her symptoms are due to this   4. B12 deficiency due to diet - cyanocobalamin ((VITAMIN B-12)) injection 1,000 mcg--cont monthly injections  5. 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 Osteoarthritis of finger of right hand -stable with current mgt on celebrex, cont same and monitor    Labs/tests ordered:  * No order type specified * Next appt:  07/21/2020  Farha Dano L. Izaac Reisig, D.O. [facility] [address], [place] [address] Cell Phone (Mon-Fri 8am-5pm):  [phone] On Call:  [phone] & follow prompts after 5pm & weekends Office Phone:  [phone] Office Fax:  [phone]

## 2020-01-30 NOTE — Patient Instructions (Signed)
Call me back if after 2 weeks of the omeprazole, you are still having the same trouble with the phlegm/congestion in your throat.

## 2020-02-06 ENCOUNTER — Ambulatory Visit: Payer: Medicare Other

## 2020-02-25 DIAGNOSIS — H10413 Chronic giant papillary conjunctivitis, bilateral: Secondary | ICD-10-CM | POA: Diagnosis not present

## 2020-02-25 DIAGNOSIS — Z961 Presence of intraocular lens: Secondary | ICD-10-CM | POA: Diagnosis not present

## 2020-02-25 DIAGNOSIS — H5203 Hypermetropia, bilateral: Secondary | ICD-10-CM | POA: Diagnosis not present

## 2020-02-25 DIAGNOSIS — H524 Presbyopia: Secondary | ICD-10-CM | POA: Diagnosis not present

## 2020-03-02 ENCOUNTER — Ambulatory Visit: Payer: Medicare Other

## 2020-03-04 ENCOUNTER — Other Ambulatory Visit: Payer: Self-pay

## 2020-03-04 ENCOUNTER — Encounter: Payer: Self-pay | Admitting: Dermatology

## 2020-03-04 ENCOUNTER — Ambulatory Visit (INDEPENDENT_AMBULATORY_CARE_PROVIDER_SITE_OTHER): Payer: Medicare Other | Admitting: Dermatology

## 2020-03-04 DIAGNOSIS — Z1283 Encounter for screening for malignant neoplasm of skin: Secondary | ICD-10-CM | POA: Diagnosis not present

## 2020-03-04 DIAGNOSIS — Z86006 Personal history of melanoma in-situ: Secondary | ICD-10-CM | POA: Diagnosis not present

## 2020-03-04 DIAGNOSIS — D485 Neoplasm of uncertain behavior of skin: Secondary | ICD-10-CM

## 2020-03-04 DIAGNOSIS — L570 Actinic keratosis: Secondary | ICD-10-CM | POA: Diagnosis not present

## 2020-03-05 ENCOUNTER — Ambulatory Visit (INDEPENDENT_AMBULATORY_CARE_PROVIDER_SITE_OTHER): Payer: Medicare Other | Admitting: *Deleted

## 2020-03-05 DIAGNOSIS — E538 Deficiency of other specified B group vitamins: Secondary | ICD-10-CM | POA: Diagnosis not present

## 2020-03-05 MED ORDER — CYANOCOBALAMIN 1000 MCG/ML IJ SOLN
1000.0000 ug | Freq: Once | INTRAMUSCULAR | Status: AC
  Administered 2020-03-05: 1000 ug via INTRAMUSCULAR

## 2020-03-08 ENCOUNTER — Encounter: Payer: Self-pay | Admitting: Dermatology

## 2020-03-08 NOTE — Progress Notes (Signed)
   Follow-Up Visit   Subjective  Jody [name] is a 79 y.o. female who presents for the following: Follow-up (FACE CRUST ).  Multiple facial crusts Location:  Duration:  Quality:  Associated Signs/Symptoms: Modifying Factors:  Severity:  Timing: Context:   Objective  Well appearing patient in no apparent distress; mood and affect are within normal limits. Objective  Head - Anterior (Face): Sun exposed areas plus back examined: No atypical moles or melanoma,.  Objective  Left Temple: Centrally eroded ill-defined 1.2 cm pink crust     Objective  Right Buccal Cheek: Waxy 1.3 cm crust, historically possibly recurrent.     Objective  Right Shoulder - Posterior: Mis-2012 tx mohs  Objective  Left Forehead, Right Zygomatic Area: Half dozen 2 to 3 mm pink facial crusts, historically stable and none currently bothersome to Jody [name].    All sun exposed areas plus back examined.   Assessment & Plan    Screening exam for skin cancer Head - Anterior (Face)  Annual skin examination.  Neoplasm of uncertain behavior of skin (2) Left Temple  Right Buccal Cheek  Patient is going to be traveling so we deferred Biopsys today. She will make appointment when she is back in town.   Personal history of melanoma in-situ Right Shoulder - Posterior  clear  AK (actinic keratosis) (2) Right Zygomatic Area; Left Forehead  Defer freezing or topical therapy.  Return if there is clinical change.      I, Lavonna Monarch, MD, have reviewed all documentation for this visit.  The documentation on 03/08/20 for the exam, diagnosis, procedures, and orders are all accurate and complete.

## 2020-03-11 NOTE — Addendum Note (Signed)
Addended by: Lavonna Monarch on: 03/11/2020 08:07 AM   Modules accepted: Level of Service

## 2020-04-08 ENCOUNTER — Ambulatory Visit (INDEPENDENT_AMBULATORY_CARE_PROVIDER_SITE_OTHER): Payer: Medicare Other

## 2020-04-08 ENCOUNTER — Ambulatory Visit (INDEPENDENT_AMBULATORY_CARE_PROVIDER_SITE_OTHER): Payer: Medicare Other | Admitting: Dermatology

## 2020-04-08 ENCOUNTER — Ambulatory Visit: Payer: Medicare Other

## 2020-04-08 ENCOUNTER — Encounter: Payer: Self-pay | Admitting: Dermatology

## 2020-04-08 ENCOUNTER — Other Ambulatory Visit: Payer: Self-pay

## 2020-04-08 DIAGNOSIS — Z85820 Personal history of malignant melanoma of skin: Secondary | ICD-10-CM | POA: Diagnosis not present

## 2020-04-08 DIAGNOSIS — C44329 Squamous cell carcinoma of skin of other parts of face: Secondary | ICD-10-CM

## 2020-04-08 DIAGNOSIS — D0439 Carcinoma in situ of skin of other parts of face: Secondary | ICD-10-CM

## 2020-04-08 DIAGNOSIS — Z85828 Personal history of other malignant neoplasm of skin: Secondary | ICD-10-CM

## 2020-04-08 DIAGNOSIS — D485 Neoplasm of uncertain behavior of skin: Secondary | ICD-10-CM

## 2020-04-08 DIAGNOSIS — E538 Deficiency of other specified B group vitamins: Secondary | ICD-10-CM | POA: Diagnosis not present

## 2020-04-08 DIAGNOSIS — C4492 Squamous cell carcinoma of skin, unspecified: Secondary | ICD-10-CM

## 2020-04-08 HISTORY — DX: Squamous cell carcinoma of skin, unspecified: C44.92

## 2020-04-08 MED ORDER — CYANOCOBALAMIN 1000 MCG/ML IJ SOLN
1000.0000 ug | Freq: Once | INTRAMUSCULAR | Status: AC
  Administered 2020-04-08: 1000 ug via INTRAMUSCULAR

## 2020-04-08 NOTE — Patient Instructions (Signed)

## 2020-04-08 NOTE — Progress Notes (Signed)
   Follow-Up Visit   Subjective  Jody [name] is a 80 y.o. female who presents for the following: Skin Problem (Biopsy on left & right temple- per patient). General skin examination, enlarging lesions along the right mid jawline and left outer eyebrow Location:  Duration:  Quality:  Associated Signs/Symptoms: Modifying Factors:  Severity:  Timing: Context: History of melanoma and mole skin cancers  Objective  Well appearing patient in no apparent distress; mood and affect are within normal limits. Objective  Right Upper Back: No sign repigmentation, no regional adenopathy  Objective  Right Submandibular Area: No sign recurrence  Objective  Left Upper Arm - Superior: No sign recurrence  Objective  Right Anterior Mandible: Elongated 1.2 cm white crust on pink base; 4.5 cm to ear junction     Objective  Left Supraorbital Region: Volcano-like 1.1 cm nodule; potential Mohs surgery.   1.5 to outer eye      All skin waist up examined.  Plus legs.   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  Yearly skin check  History of basal cell cancer Left Upper Arm - Superior  Yearly skin check  Neoplasm of uncertain behavior of skin (2) Right Anterior Mandible  Skin / nail biopsy Type of biopsy: tangential   Informed consent: discussed and consent obtained   Timeout: patient name, date of birth, surgical site, and procedure verified   Anesthesia: the lesion was anesthetized in a standard fashion   Anesthetic:  1% lidocaine w/ epinephrine 1-100,000 local infiltration Instrument used: flexible razor blade   Hemostasis achieved with: ferric subsulfate   Outcome: patient tolerated procedure well   Post-procedure details: sterile dressing applied and wound care instructions given   Dressing type: bandage and petrolatum    Specimen  1 - Surgical pathology Differential Diagnosis:bcc scc  Check Margins: No  Left Supraorbital Region  Skin / nail biopsy Type of biopsy: tangential   Informed consent: discussed and consent obtained   Timeout: patient name, date of birth, surgical site, and procedure verified   Anesthesia: the lesion was anesthetized in a standard fashion   Anesthetic:  1% lidocaine w/ epinephrine 1-100,000 local infiltration Instrument used: flexible razor blade   Hemostasis achieved with: ferric subsulfate   Outcome: patient tolerated procedure well   Post-procedure details: sterile dressing applied and wound care instructions given   Dressing type: bandage and petrolatum    Specimen 2 - Surgical pathology Differential Diagnosis: bcc scc  Check Margins: No     I, Lavonna Monarch, MD, have reviewed all documentation for this visit.  The documentation on 04/13/20 for the exam, diagnosis, procedures, and orders are all accurate and complete. [name]

## 2020-04-13 ENCOUNTER — Encounter: Payer: Self-pay | Admitting: Dermatology

## 2020-04-15 ENCOUNTER — Telehealth: Payer: Self-pay

## 2020-04-15 NOTE — Telephone Encounter (Signed)
Phone call to patient with her pathology results. Patient aware.  

## 2020-04-15 NOTE — Telephone Encounter (Signed)
-----   Message from Lavonna Monarch, MD sent at 04/15/2020  4:22 AM EST ----- Please schedule the left eyebrow SCCA for Mohs and the right cheek CIS 30 minutes with me.

## 2020-05-11 ENCOUNTER — Ambulatory Visit (INDEPENDENT_AMBULATORY_CARE_PROVIDER_SITE_OTHER): Payer: Medicare Other | Admitting: *Deleted

## 2020-05-11 ENCOUNTER — Other Ambulatory Visit: Payer: Self-pay

## 2020-05-11 DIAGNOSIS — E538 Deficiency of other specified B group vitamins: Secondary | ICD-10-CM

## 2020-05-11 MED ORDER — CYANOCOBALAMIN 1000 MCG/ML IJ SOLN
1000.0000 ug | Freq: Once | INTRAMUSCULAR | Status: AC
Start: 2020-05-11 — End: 2020-05-11
  Administered 2020-05-11: 1000 ug via INTRAMUSCULAR

## 2020-05-13 DIAGNOSIS — C44329 Squamous cell carcinoma of skin of other parts of face: Secondary | ICD-10-CM | POA: Diagnosis not present

## 2020-05-18 ENCOUNTER — Encounter: Payer: Self-pay | Admitting: Internal Medicine

## 2020-05-25 ENCOUNTER — Encounter: Payer: Self-pay | Admitting: Internal Medicine

## 2020-06-08 ENCOUNTER — Ambulatory Visit (INDEPENDENT_AMBULATORY_CARE_PROVIDER_SITE_OTHER): Payer: Medicare Other | Admitting: Family Medicine

## 2020-06-08 ENCOUNTER — Other Ambulatory Visit: Payer: Self-pay

## 2020-06-08 ENCOUNTER — Encounter: Payer: Self-pay | Admitting: Family Medicine

## 2020-06-08 DIAGNOSIS — R202 Paresthesia of skin: Secondary | ICD-10-CM | POA: Diagnosis not present

## 2020-06-08 DIAGNOSIS — M542 Cervicalgia: Secondary | ICD-10-CM | POA: Diagnosis not present

## 2020-06-08 DIAGNOSIS — G8929 Other chronic pain: Secondary | ICD-10-CM | POA: Diagnosis not present

## 2020-06-08 DIAGNOSIS — M25561 Pain in right knee: Secondary | ICD-10-CM | POA: Diagnosis not present

## 2020-06-08 DIAGNOSIS — R200 Anesthesia of skin: Secondary | ICD-10-CM | POA: Diagnosis not present

## 2020-06-08 DIAGNOSIS — M25562 Pain in left knee: Secondary | ICD-10-CM

## 2020-06-08 NOTE — Patient Instructions (Signed)
    Knees:  Glucosamine sulfate 1,000 mg twice daily  Turmeric:  500 mg twice daily

## 2020-06-08 NOTE — Progress Notes (Signed)
Office Visit Note   Patient: Jody [name]           Date of Birth: June 09, 1940           MRN: [id number] Visit Date: 06/08/2020 Requested by: Gayland Curry, DO [place],  [place] [address] PCP: Gayland Curry, DO  Subjective: Chief Complaint  Patient presents with  . Neck - Pain    Pain mostly on the right side of the neck and into that shoulder, continuous x over 1 year. Has some numbness in the left hand (thumb and index finger) - intermittent. Going to PT, but wants to know what else can be done.   . Right Knee - Pain    Both knees hurt - "they didn't used to." Started 5 months ago. She has lost 40 lbs and exercises regularly, but they continue to bother her. The left is worse than the right. H/o patella fracture on the left in 2017. Notices the pain with walking and exercising in the pool. Ambulates with a cane.  . Left Knee - Pain    HPI: She is here for follow-up chronic neck pain.  Doing physical therapy with Edwena Felty and seems to have reached a plateau.  Her pain is constant, and she is getting occasional numbness and tingling in her left thumb and index finger.  She wonders what else can be done for her neck.  She also complains of bilateral knee pain.  She fell in 2017 and fractured her left patella.  She has had ongoing intermittent troubles with her knees since then.                ROS:   All other systems were reviewed and are negative.  Objective: Vital Signs: There were no vitals taken for this visit.  Physical Exam:  General:  Alert and oriented, in no acute distress. Pulm:  Breathing unlabored. Psy:  Normal mood, congruent affect  Neck: She has pain at the extremes of range of motion, negative Spurling's test.  She has tenderness in the cervical paraspinous muscles bilaterally.  Upper extremity strength remains normal bilaterally.  She has positive Phalen's test in the left wrist, negative Tinel's at the carpal tunnel. Knees: 2+ patellofemoral  crepitus bilaterally with 1+ effusions.  Mild medial compartment joint space tenderness.   Imaging: No results found.  Assessment & Plan: 1.  Chronic neck pain with spondylosis -Discussed options and elected to proceed with MRI scan followed by referral to Dr. Ernestina Patches for possible epidural versus facet injections.  2.  Left hand numbness and tingling, suspicious for carpal tunnel syndrome.  Cannot rule out cervical radiculopathy. -Trial of carpal tunnel night splint.  3.  Bilateral knee DJD -Trial of glucosamine and turmeric.     Procedures: No procedures performed        PMFS History: Patient Active Problem List   Diagnosis Date Noted  . Pure hypercholesterolemia 11/14/2019  . Hyperglycemia 11/14/2019  . Decreased visual acuity 11/14/2019  . Other fatigue 11/14/2019  . Positional lightheadedness 11/14/2019  . Gastroesophageal reflux disease 02/04/2019  . BMI 32.0-32.9,adult 02/12/2018  . Obesity (BMI 30.0-34.9) 07/17/2017  . Impingement syndrome of right shoulder 07/04/2016  . Chronic pain of both knees 07/04/2016  . 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 Displaced transverse fracture of left patella, subsequent encounter for closed fracture with delayed healing 02/03/2016  . Finger fracture, left 02/03/2016  . Leg cramps, sleep related 11/19/2015  .  Cervical spondylosis without myelopathy 06/08/2015  . Osteoarthritis of finger of right hand 06/08/2015  . Absolute anemia 05/16/2014  . Primary osteoarthritis involving multiple joints 05/16/2014  . Cataract cortical, senile 05/16/2014  . Essential hypertension 09/03/2012  . Edema of both legs   . Senile osteoporosis   . B12 deficiency due to diet    Past Medical History:  Diagnosis Date  . Anemia, unspecified   . B12 deficiency due to diet   . Basal cell carcinoma    LEFT UPPER ARM SUP. TX=CX3 5FU  . Cramp of limb   . Edema of both legs   . Encounter for long-term  (current) use of other medications   . Lumbago   . Melanoma ([place]) 10/18/2010   RIGHT POST SHOULDER =MOHS  . Obesity   . Osteoarthrosis, unspecified whether generalized or localized, unspecified site   . Restless legs syndrome (RLS)   . SCC (squamous cell carcinoma) 07/03/2017   RIGHT CHEST CX3 5FU  . SCC (squamous cell carcinoma) 01/28/2019   LEFT FOREARM CX3 5FU  . SCC (squamous cell carcinoma) [id number]   RIGHT FOREHEAD CX3 5FU  . SCC (squamous cell carcinoma) 07/03/2017   RIGHT FOREARM CX3 5FU  . SCCA (squamous cell carcinoma) of skin 04/08/2020   Right Anterior Mandible (in situ)  . SCCA (squamous cell carcinoma) of skin 04/08/2020   Left Supraorbital Region (Keratoacanthoma)  . Senile osteoporosis   . Squamous cell carcinoma of skin 01/30/2017   RIGHT JAWLINE TX WITH BX    Family History  Problem Relation Age of Onset  . Cancer Sister   . Cancer Brother     Past Surgical History:  Procedure Laterality Date  . CATARACT EXTRACTION W/ INTRAOCULAR LENS  IMPLANT, BILATERAL  04/2014  . 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 [place]  2005  . [place]   stapleing   . VITRECTOMY Right    with membrane peeling for a macular pucker   Social History   Occupational History  . Occupation: Retired Manufacturing engineer: [employer]  Tobacco Use  . Smoking status: Former Smoker    Quit date: 05/22/1975    Years since quitting: 45.0  . Smokeless tobacco: Never Used  . Tobacco comment: Quit in early 46's  Vaping Use  . Vaping Use: Never used  Substance and Sexual Activity  . Alcohol use: No    Alcohol/week: 0.0 standard drinks  . Drug use: No  . Sexual activity: Never

## 2020-06-09 ENCOUNTER — Ambulatory Visit: Payer: Medicare Other

## 2020-06-11 ENCOUNTER — Other Ambulatory Visit: Payer: Self-pay

## 2020-06-11 ENCOUNTER — Ambulatory Visit (INDEPENDENT_AMBULATORY_CARE_PROVIDER_SITE_OTHER): Payer: Medicare Other | Admitting: *Deleted

## 2020-06-11 DIAGNOSIS — E538 Deficiency of other specified B group vitamins: Secondary | ICD-10-CM | POA: Diagnosis not present

## 2020-06-11 MED ORDER — CYANOCOBALAMIN 1000 MCG/ML IJ SOLN
1000.0000 ug | Freq: Once | INTRAMUSCULAR | Status: AC
Start: 2020-06-11 — End: 2020-06-11
  Administered 2020-06-11: 1000 ug via INTRAMUSCULAR

## 2020-06-19 ENCOUNTER — Other Ambulatory Visit: Payer: Self-pay

## 2020-06-19 ENCOUNTER — Encounter (HOSPITAL_COMMUNITY): Payer: Self-pay | Admitting: Radiology

## 2020-06-19 ENCOUNTER — Ambulatory Visit (HOSPITAL_COMMUNITY)
Admission: RE | Admit: 2020-06-19 | Discharge: 2020-06-19 | Disposition: A | Discharge status: Home / Self Care | Payer: Medicare Other | Source: Ambulatory Visit | Attending: Family Medicine | Admitting: Family Medicine

## 2020-06-19 DIAGNOSIS — M542 Cervicalgia: Secondary | ICD-10-CM | POA: Diagnosis not present

## 2020-06-22 ENCOUNTER — Telehealth: Payer: Self-pay | Admitting: Family Medicine

## 2020-06-22 DIAGNOSIS — R200 Anesthesia of skin: Secondary | ICD-10-CM

## 2020-06-22 DIAGNOSIS — M542 Cervicalgia: Secondary | ICD-10-CM

## 2020-06-22 NOTE — Addendum Note (Signed)
Addended by: Hortencia Pilar on: 06/22/2020 08:06 AM   Modules accepted: Orders

## 2020-06-22 NOTE — Telephone Encounter (Signed)
MRI scan shows moderate narrowing of the left-sided nerve opening at C3-4 and on the right at C5-6.  Since symptoms are primarily left-sided, I suspect the C3-4 level is the source of most of the pain.  No clear-cut indication for surgery, but could consider referral to Dr. Ernestina Patches for injection as previously discussed.

## 2020-07-01 ENCOUNTER — Ambulatory Visit (INDEPENDENT_AMBULATORY_CARE_PROVIDER_SITE_OTHER): Payer: Medicare Other | Admitting: Physical Medicine and Rehabilitation

## 2020-07-01 ENCOUNTER — Other Ambulatory Visit: Payer: Self-pay

## 2020-07-01 ENCOUNTER — Encounter: Payer: Self-pay | Admitting: Physical Medicine and Rehabilitation

## 2020-07-01 ENCOUNTER — Ambulatory Visit: Payer: Self-pay

## 2020-07-01 VITALS — BP 130/65 | HR 78

## 2020-07-01 DIAGNOSIS — M5412 Radiculopathy, cervical region: Secondary | ICD-10-CM | POA: Diagnosis not present

## 2020-07-01 MED ORDER — BETAMETHASONE SOD PHOS & ACET 6 (3-3) MG/ML IJ SUSP
12.0000 mg | Freq: Once | INTRAMUSCULAR | Status: AC
  Administered 2020-07-01: 12 mg

## 2020-07-01 NOTE — Progress Notes (Signed)
Pt state neck pain. Pt state laying down at night makes the pain worse. Pt state she take pain meds to help ease her pain.  Numeric Pain Rating Scale and Functional Assessment Average Pain 6   In the last MONTH (on 0-10 scale) has pain interfered with the following?  1. General activity like being  able to carry out your everyday physical activities such as walking, climbing stairs, carrying groceries, or moving a chair?  Rating(6)   +Driver, -BT, -Dye Allergies.

## 2020-07-01 NOTE — Patient Instructions (Signed)

## 2020-07-07 ENCOUNTER — Encounter: Payer: Self-pay | Admitting: Internal Medicine

## 2020-07-07 DIAGNOSIS — I10 Essential (primary) hypertension: Secondary | ICD-10-CM

## 2020-07-07 MED ORDER — LISINOPRIL 5 MG PO TABS: 5.0000 mg | ORAL_TABLET | Freq: Every day | ORAL | 3 refills | Status: DC | PRN

## 2020-07-07 NOTE — Telephone Encounter (Signed)
Called patient to confirm medication. Patient verbalized that medication she needs filled is Lisinopril. Medication sent into pharmacy. Patient requested Lauree Chandler, NP as PCP due to Dr.Reed leaving office.

## 2020-07-14 ENCOUNTER — Ambulatory Visit (INDEPENDENT_AMBULATORY_CARE_PROVIDER_SITE_OTHER): Payer: Medicare Other | Admitting: *Deleted

## 2020-07-14 ENCOUNTER — Other Ambulatory Visit: Payer: Self-pay

## 2020-07-14 ENCOUNTER — Other Ambulatory Visit: Payer: Self-pay | Admitting: *Deleted

## 2020-07-14 VITALS — Wt 133.2 lb

## 2020-07-14 DIAGNOSIS — E538 Deficiency of other specified B group vitamins: Secondary | ICD-10-CM

## 2020-07-14 DIAGNOSIS — I10 Essential (primary) hypertension: Secondary | ICD-10-CM

## 2020-07-14 MED ORDER — CYANOCOBALAMIN 1000 MCG/ML IJ SOLN
1000.0000 ug | Freq: Once | INTRAMUSCULAR | Status: AC
  Administered 2020-07-14: 1000 ug via INTRAMUSCULAR

## 2020-07-14 MED ORDER — LISINOPRIL 5 MG PO TABS: 5.0000 mg | ORAL_TABLET | Freq: Every day | ORAL | 3 refills | Status: DC | PRN

## 2020-07-14 NOTE — Telephone Encounter (Signed)
Patient requested refill to be sent to [facility].  

## 2020-07-16 ENCOUNTER — Ambulatory Visit (INDEPENDENT_AMBULATORY_CARE_PROVIDER_SITE_OTHER): Payer: Medicare Other | Admitting: Dermatology

## 2020-07-16 ENCOUNTER — Encounter: Payer: Self-pay | Admitting: Dermatology

## 2020-07-16 ENCOUNTER — Other Ambulatory Visit: Payer: Self-pay

## 2020-07-16 DIAGNOSIS — C44329 Squamous cell carcinoma of skin of other parts of face: Secondary | ICD-10-CM

## 2020-07-16 DIAGNOSIS — L570 Actinic keratosis: Secondary | ICD-10-CM

## 2020-07-16 DIAGNOSIS — C4492 Squamous cell carcinoma of skin, unspecified: Secondary | ICD-10-CM

## 2020-07-21 ENCOUNTER — Encounter: Payer: Medicare Other | Admitting: Family

## 2020-07-24 NOTE — Procedures (Signed)
Cervical Epidural Steroid Injection - Interlaminar Approach with Fluoroscopic Guidance  Patient: Jody [name]      Date of Birth: July 17, 1940 MRN: [id number] PCP: Lauree Chandler, NP      Visit Date: 07/01/2020   Universal Protocol:    Date/Time: 04/29/222:41 PM  Consent Given By: the patient  Position: PRONE  Additional Comments: Vital signs were monitored before and after the procedure. Patient was prepped and draped in the usual sterile fashion. The correct patient, procedure, and site was verified.   Injection Procedure Details:   Procedure diagnoses: Cervical radiculopathy [M54.12]    Meds Administered:  Meds ordered this encounter  Medications  . betamethasone acetate-betamethasone sodium phosphate (CELESTONE) injection 12 mg     Laterality: Right  Location/Site: C7-T1  Needle: 3.5 in., 20 ga. Tuohy  Needle Placement: Paramedian epidural space  Findings:  -Comments: Excellent flow of contrast into the epidural space.  Procedure Details: Using a paramedian approach from the side mentioned above, the region overlying the inferior lamina was localized under fluoroscopic visualization and the soft tissues overlying this structure were infiltrated with 4 ml. of 1% Lidocaine without Epinephrine. A # 20 gauge, Tuohy needle was inserted into the epidural space using a paramedian approach.  The epidural space was localized using loss of resistance along with contralateral oblique bi-planar fluoroscopic views.  After negative aspirate for air, blood, and CSF, a 2 ml. volume of Isovue-250 was injected into the epidural space and the flow of contrast was observed. Radiographs were obtained for documentation purposes.   The injectate was administered into the level noted above.  Additional Comments:  The patient tolerated the procedure well Dressing: 2 x 2 sterile gauze and Band-Aid    Post-procedure details: Patient was observed during the procedure. Post-procedure  instructions were reviewed.  Patient left the clinic in stable condition.

## 2020-07-24 NOTE — Progress Notes (Signed)
Jody [name] - 80 y.o. female MRN [id number]  Date of birth: 1940-04-02  Office Visit Note: Visit Date: 07/01/2020 PCP: Lauree Chandler, NP Referred by: Gayland Curry, DO  Subjective: Chief Complaint  Patient presents with  . Neck - Pain   HPI:  ACASIA [name] is a 80 y.o. female who comes in today at the request of Dr. Eunice Blase for planned Right C7-T1 Cervical epidural steroid injection with fluoroscopic guidance.  The patient has failed conservative care including home exercise, medications, time and activity modification.  This injection will be diagnostic and hopefully therapeutic.  Please see requesting physician notes for further details and justification.   ROS Otherwise per HPI.  Assessment & Plan: Visit Diagnoses:    ICD-10-CM   1. Cervical radiculopathy  M54.12 XR C-ARM NO REPORT    Epidural Steroid injection    betamethasone acetate-betamethasone sodium phosphate (CELESTONE) injection 12 mg    Plan: No additional findings.   Meds & Orders:  Meds ordered this encounter  Medications  . betamethasone acetate-betamethasone sodium phosphate (CELESTONE) injection 12 mg    Orders Placed This Encounter  Procedures  . XR C-ARM NO REPORT  . Epidural Steroid injection    Follow-up: No follow-ups on file.   Procedures: No procedures performed  Cervical Epidural Steroid Injection - Interlaminar Approach with Fluoroscopic Guidance  Patient: CAIDANCE [name]      Date of Birth: [birth year] MRN: [id number] PCP: Lauree Chandler, NP      Visit Date: 07/01/2020   Universal Protocol:    Date/Time: 04/29/222:41 PM  Consent Given By: the patient  Position: PRONE  Additional Comments: Vital signs were monitored before and after the procedure. Patient was prepped and draped in the usual sterile fashion. The correct patient, procedure, and site was verified.   Injection Procedure Details:   Procedure diagnoses: Cervical radiculopathy [M54.12]    Meds  Administered:  Meds ordered this encounter  Medications  . betamethasone acetate-betamethasone sodium phosphate (CELESTONE) injection 12 mg     Laterality: Right  Location/Site: C7-T1  Needle: 3.5 in., 20 ga. Tuohy  Needle Placement: Paramedian epidural space  Findings:  -Comments: Excellent flow of contrast into the epidural space.  Procedure Details: Using a paramedian approach from the side mentioned above, the region overlying the inferior lamina was localized under fluoroscopic visualization and the soft tissues overlying this structure were infiltrated with 4 ml. of 1% Lidocaine without Epinephrine. A # 20 gauge, Tuohy needle was inserted into the epidural space using a paramedian approach.  The epidural space was localized using loss of resistance along with contralateral oblique bi-planar fluoroscopic views.  After negative aspirate for air, blood, and CSF, a 2 ml. volume of Isovue-250 was injected into the epidural space and the flow of contrast was observed. Radiographs were obtained for documentation purposes.   The injectate was administered into the level noted above.  Additional Comments:  The patient tolerated the procedure well Dressing: 2 x 2 sterile gauze and Band-Aid    Post-procedure details: Patient was observed during the procedure. Post-procedure instructions were reviewed.  Patient left the clinic in stable condition.     Clinical History: MRI CERVICAL SPINE WITHOUT CONTRAST  TECHNIQUE: Multiplanar, multisequence MR imaging of the cervical spine was performed. No intravenous contrast was administered.  COMPARISON:  None.  FINDINGS: Alignment: Grade 1 retrolisthesis at C3-4 and C5-6  Vertebrae: No fracture, evidence of discitis, or bone lesion.  Cord: Normal signal and morphology.  Posterior Fossa,  vertebral arteries, paraspinal tissues: Negative.  Disc levels:  C1-2: Unremarkable.  C2-3: Normal disc space and facet joints.  There is no spinal canal stenosis. No neural foraminal stenosis.  C3-4: Small disc bulge. There is no spinal canal stenosis. Moderate left neural foraminal stenosis.  C4-5: Disc space narrowing with fusion across the posterior aspect of the disc space. There is no spinal canal stenosis. No neural foraminal stenosis.  C5-6: Disc space narrowing with small bulge and uncovertebral hypertrophy. There is no spinal canal stenosis. Moderate right neural foraminal stenosis.  C6-7: Normal disc space and facet joints. There is no spinal canal stenosis. No neural foraminal stenosis.  C7-T1: Normal disc space and facet joints. There is no spinal canal stenosis. No neural foraminal stenosis.  IMPRESSION: 1. Moderate left C3-4 and right C5-6 neural foraminal stenosis. 2. No spinal canal stenosis.   Electronically Signed   By: Ulyses Jarred M.D.   On: 06/20/2020 21:14 ------  IMPRESSION: Large L5-S1 disc extrusion displaces the traversing RIGHT L5 nerve and encroaches upon the traversing RIGHT S1 nerve.  L3-4 PLIF and posterior decompression. 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  Multilevel neural foraminal narrowing: Severe on the RIGHT at L4-5, moderate to severe on the LEFT at L5-S1.   Electronically Signed By: Elon Alas M.D. On: 04/12/2016 19:53     Objective:  VS:  HT:    WT:   BMI:     BP:130/65  HR:78bpm  TEMP: ( )  RESP:  Physical Exam Vitals and nursing note reviewed.  Constitutional:      General: She is not in acute distress.    Appearance: Normal appearance. She is not ill-appearing.  HENT:     Head: Normocephalic and atraumatic.     Right Ear: External ear normal.     Left Ear: External ear normal.  Eyes:     Extraocular Movements: Extraocular movements intact.  Cardiovascular:      Rate and Rhythm: Normal rate.     Pulses: Normal pulses.  Musculoskeletal:     Cervical back: Tenderness present. No rigidity.     Right lower leg: No edema.     Left lower leg: No edema.     Comments: Patient has good strength in the upper extremities including 5 out of 5 strength in wrist extension long finger flexion and APB.  There is no atrophy of the hands intrinsically.  There is a negative Hoffmann's test.   Lymphadenopathy:     Cervical: No cervical adenopathy.  Skin:    Findings: No erythema, lesion or rash.  Neurological:     General: No focal deficit present.     Mental Status: She is alert and oriented to person, place, and time.     Sensory: No sensory deficit.     Motor: No weakness or abnormal muscle tone.     Coordination: Coordination normal.  Psychiatric:        Mood and Affect: Mood normal.        Behavior: Behavior normal.      Imaging: No results found.

## 2020-07-26 ENCOUNTER — Encounter: Payer: Self-pay | Admitting: Dermatology

## 2020-07-27 NOTE — Progress Notes (Signed)
   Follow-Up Visit   Subjective  Jody [name] is a 80 y.o. female who presents for the following: Procedure.  Skin cancer Location: Right cheek Duration:  Quality:  Associated Signs/Symptoms: Modifying Factors:  Severity:  Timing: Context: For treatment, also other crusts  Objective  Well appearing patient in no apparent distress; mood and affect are within normal limits. Objective  Right Anterior Mandible: Biopsy site identified by nurse, patient, and me.  Objective  Dorsum of Nose (2), Right Buccal Cheek (2), Right Zygomatic Area (2): Half dozen 2 to 4 mm gritty pink crusts    A focused examination was performed including Head, neck, arms.. Relevant physical exam findings are noted in the Assessment and Plan.   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  Destruction of lesion Complexity: simple   Destruction method: electrodesiccation and curettage   Informed consent: discussed and consent obtained   Timeout:  patient name, date of birth, surgical site, and procedure verified Anesthesia: the lesion was anesthetized in a standard fashion   Anesthetic:  1% lidocaine w/ epinephrine 1-100,000 local infiltration Curettage performed in three different directions: Yes   Curettage cycles:  3 Lesion length (cm):  1.6 Lesion width (cm):  1.6 Margin per side (cm):  0 Final wound size (cm):  1.6 Hemostasis achieved with:  ferric subsulfate Outcome: patient tolerated procedure well with no complications   Additional details:  Wound innoculated with 5 fluorouracil solution.  AK (actinic keratosis) (6) Dorsum of Nose (2); Right Zygomatic Area (2); Right Buccal Cheek (2)  Destruction of lesion - Dorsum of Nose, Right Buccal Cheek, Right Zygomatic Area Complexity: simple   Destruction method: cryotherapy   Informed consent: discussed and consent obtained   Timeout:  patient name, date of birth, surgical site, and procedure  verified Lesion destroyed using liquid nitrogen: Yes   Cryotherapy cycles:  3 Outcome: patient tolerated procedure well with no complications        I, Lavonna Monarch, MD, have reviewed all documentation for this visit.  The documentation on 07/27/20 for the exam, diagnosis, procedures, and orders are all accurate and complete.

## 2020-08-11 ENCOUNTER — Encounter: Payer: Medicare Other | Admitting: Family

## 2020-08-14 ENCOUNTER — Other Ambulatory Visit: Payer: Self-pay

## 2020-08-14 ENCOUNTER — Ambulatory Visit (INDEPENDENT_AMBULATORY_CARE_PROVIDER_SITE_OTHER): Payer: Medicare Other | Admitting: *Deleted

## 2020-08-14 DIAGNOSIS — E538 Deficiency of other specified B group vitamins: Secondary | ICD-10-CM | POA: Diagnosis not present

## 2020-08-14 MED ORDER — CYANOCOBALAMIN 1000 MCG/ML IJ SOLN
1000.0000 ug | Freq: Once | INTRAMUSCULAR | Status: AC
  Administered 2020-08-14: 1000 ug via INTRAMUSCULAR

## 2020-08-27 ENCOUNTER — Telehealth: Payer: Self-pay

## 2020-08-27 NOTE — Telephone Encounter (Signed)
RN spoke with Jody [name] who expresses her wishes to continue with Dr. Hollace Kinnier as her PCP. Consent sent to patient and appt scheduled

## 2020-08-31 NOTE — Progress Notes (Signed)
Late entry clarification to 08/27/20 telephone encounter:  RN returned call to patient to follow up on request from patient to continue with Dr. Hollace Kinnier as provider

## 2020-09-17 ENCOUNTER — Ambulatory Visit: Payer: Medicare Other

## 2020-09-24 ENCOUNTER — Ambulatory Visit: Payer: Medicare Other

## 2020-09-29 ENCOUNTER — Other Ambulatory Visit: Payer: Medicare Other | Admitting: Internal Medicine

## 2020-09-29 ENCOUNTER — Other Ambulatory Visit: Payer: Self-pay

## 2020-09-30 ENCOUNTER — Other Ambulatory Visit: Payer: Self-pay

## 2020-09-30 ENCOUNTER — Ambulatory Visit (INDEPENDENT_AMBULATORY_CARE_PROVIDER_SITE_OTHER): Payer: Medicare Other | Admitting: *Deleted

## 2020-09-30 DIAGNOSIS — E538 Deficiency of other specified B group vitamins: Secondary | ICD-10-CM

## 2020-09-30 DIAGNOSIS — Z1231 Encounter for screening mammogram for malignant neoplasm of breast: Secondary | ICD-10-CM | POA: Diagnosis not present

## 2020-09-30 MED ORDER — CYANOCOBALAMIN 1000 MCG/ML IJ SOLN
1000.0000 ug | Freq: Once | INTRAMUSCULAR | Status: AC
  Administered 2020-09-30: 1000 ug via INTRAMUSCULAR

## 2020-09-30 NOTE — Progress Notes (Signed)
Patient stated that she had a phone conversation with Dr. Mariea Clonts yesterday and they came to the conclusion that Dr. Mariea Clonts would not be able to meet patient's needs through home visits.  Patient is requesting to re-establish with our office.

## 2020-10-11 DIAGNOSIS — Z20822 Contact with and (suspected) exposure to covid-19: Secondary | ICD-10-CM | POA: Diagnosis not present

## 2020-10-27 DIAGNOSIS — M4316 Spondylolisthesis, lumbar region: Secondary | ICD-10-CM | POA: Diagnosis not present

## 2020-10-27 DIAGNOSIS — M48062 Spinal stenosis, lumbar region with neurogenic claudication: Secondary | ICD-10-CM | POA: Diagnosis not present

## 2020-10-27 DIAGNOSIS — M810 Age-related osteoporosis without current pathological fracture: Secondary | ICD-10-CM | POA: Diagnosis not present

## 2020-10-27 DIAGNOSIS — M4126 Other idiopathic scoliosis, lumbar region: Secondary | ICD-10-CM | POA: Diagnosis not present

## 2020-10-28 ENCOUNTER — Encounter: Payer: Self-pay | Admitting: Nurse Practitioner

## 2020-10-28 ENCOUNTER — Other Ambulatory Visit: Payer: Self-pay

## 2020-10-28 ENCOUNTER — Ambulatory Visit (INDEPENDENT_AMBULATORY_CARE_PROVIDER_SITE_OTHER): Payer: Medicare Other | Admitting: Nurse Practitioner

## 2020-10-28 VITALS — BP 124/74 | HR 71 | Temp 97.1°F | Ht 63.08 in | Wt 123.4 lb

## 2020-10-28 DIAGNOSIS — M8949 Other hypertrophic osteoarthropathy, multiple sites: Secondary | ICD-10-CM | POA: Diagnosis not present

## 2020-10-28 DIAGNOSIS — M5416 Radiculopathy, lumbar region: Secondary | ICD-10-CM

## 2020-10-28 DIAGNOSIS — E538 Deficiency of other specified B group vitamins: Secondary | ICD-10-CM

## 2020-10-28 DIAGNOSIS — K219 Gastro-esophageal reflux disease without esophagitis: Secondary | ICD-10-CM | POA: Diagnosis not present

## 2020-10-28 DIAGNOSIS — N1831 Chronic kidney disease, stage 3a: Secondary | ICD-10-CM

## 2020-10-28 DIAGNOSIS — M810 Age-related osteoporosis without current pathological fracture: Secondary | ICD-10-CM

## 2020-10-28 DIAGNOSIS — E7800 Pure hypercholesterolemia, unspecified: Secondary | ICD-10-CM | POA: Diagnosis not present

## 2020-10-28 DIAGNOSIS — I10 Essential (primary) hypertension: Secondary | ICD-10-CM | POA: Diagnosis not present

## 2020-10-28 DIAGNOSIS — R7989 Other specified abnormal findings of blood chemistry: Secondary | ICD-10-CM | POA: Diagnosis not present

## 2020-10-28 DIAGNOSIS — Z20822 Contact with and (suspected) exposure to covid-19: Secondary | ICD-10-CM | POA: Diagnosis not present

## 2020-10-28 DIAGNOSIS — M159 Polyosteoarthritis, unspecified: Secondary | ICD-10-CM

## 2020-10-28 MED ORDER — CELECOXIB 100 MG PO CAPS: 100.0000 mg | ORAL_CAPSULE | Freq: Every day | ORAL | 1 refills | Status: DC

## 2020-10-28 NOTE — Patient Instructions (Addendum)
To try to use tylenol before celebrex  Can use tylenol 500 mg 2 tablets 1000 mg by mouth every 8 hours- max dose of tylenol is 3000 mg in 24 hours.   Can take tylenol with celebrex

## 2020-10-28 NOTE — Progress Notes (Signed)
Careteam: Patient Care Team: Lauree Chandler, NP as PCP - General (Geriatric Medicine) Marica Otter, [place] as Consulting Physician (Optometry) Regal, Tamala Fothergill, DPM as Consulting Physician (Podiatry) Debara Pickett, Nadean Corwin, MD as Consulting Physician (Cardiology) Lavonna Monarch, MD as Consulting Physician (Dermatology)  PLACE OF SERVICE:  [place] Directive information Does Patient Have a Medical Advance Directive?: Yes, Type of Advance Directive: [place];Living will, Does patient want to make changes to medical advance directive?: No - Patient declined  Allergies  Allergen Reactions   Prolia [Denosumab] Other (See Comments)    Chest pain   Percocet [Oxycodone-Acetaminophen]     Confusion    Shrimp [Shellfish Allergy]     All seafood    Chief Complaint  Patient presents with   Medical Management of Chronic Issues    Yearly routine visit. Discuss need for coivd vaccine or exclude. Flu vaccine is not available in office today, patient aware she can schedule a nurse visit in lat August-early September or receive at local pharmcy. Moderate fall risk. Discuss refill request for [facility] scripts     HPI: Patient is a 80 y.o. female for routine follow up.   B12 def- has scheduled injection.   Reports she has tried to lose significant weight. She is down from 152 lbs. She is wanting to maintain at this time.   Reports she had mammogram this year.   Seeing neurosurgery for OA and back pain. She is taking gabapentin 300 mg TID. She continues to have pain but manageable on this.   Celebrex for hands and feet- takes daily every day. She tries to hold off on the second dose.   Uses tylenol PM at night to help with pain at night  Review of Systems:  Review of Systems  Constitutional:  Negative for chills, fever and weight loss.  HENT:  Negative for tinnitus.   Respiratory:  Negative for cough, sputum production and shortness of breath.    Cardiovascular:  Negative for chest pain, palpitations and leg swelling.  Gastrointestinal:  Negative for abdominal pain, constipation, diarrhea and heartburn.  Genitourinary:  Negative for dysuria, frequency and urgency.  Musculoskeletal:  Positive for back pain, joint pain and myalgias. Negative for falls.  Skin: Negative.   Neurological:  Negative for dizziness and headaches.  Psychiatric/Behavioral:  Negative for depression and memory loss. The patient does not have insomnia.    Past Medical History:  Diagnosis Date   Anemia, unspecified    B12 deficiency due to diet    Basal cell carcinoma    LEFT UPPER ARM SUP. TX=CX3 5FU   Cramp of limb    Edema of both legs    Encounter for long-term (current) use of other medications    Lumbago    Melanoma ([place]) 10/18/2010   RIGHT POST SHOULDER =MOHS   Obesity    Osteoarthrosis, unspecified whether generalized or localized, unspecified site    Restless legs syndrome (RLS)    SCC (squamous cell carcinoma) 07/03/2017   RIGHT CHEST CX3 5FU   SCC (squamous cell carcinoma) 01/28/2019   LEFT FOREARM CX3 5FU   SCC (squamous cell carcinoma) [id number]   RIGHT FOREHEAD CX3 5FU   SCC (squamous cell carcinoma) 07/03/2017   RIGHT FOREARM CX3 5FU   SCCA (squamous cell carcinoma) of skin 04/08/2020   Right Anterior Mandible (in situ)(CX35FU)   SCCA (squamous cell carcinoma) of skin 04/08/2020   Left Supraorbital Region (Keratoacanthoma)   Senile osteoporosis    Squamous  cell carcinoma of skin 01/30/2017   RIGHT JAWLINE TX WITH BX   Past Surgical History:  Procedure Laterality Date   CATARACT EXTRACTION W/ INTRAOCULAR LENS  IMPLANT, BILATERAL  04/2014   EYE SURGERY Right 04/28/2016   Macular   SPINE SURGERY  2005   STOMACH SURGERY  1981   stapleing    VITRECTOMY Right    with membrane peeling for a macular pucker   Social History:   reports that she quit smoking about 45 years ago. She has never used smokeless tobacco. She reports that  she does not drink alcohol and does not use drugs.  Family History  Problem Relation Age of Onset   Cancer Sister    Cancer Brother     Medications: Patient's Medications  New Prescriptions   No medications on file  Previous Medications   CALCIUM CARBONATE-VIT D-MIN (CALCIUM 1200 PO)    Take 1 tablet by mouth daily.   CELECOXIB (CELEBREX) 100 MG CAPSULE    Take 1 capsule (100 mg total) by mouth 2 (two) times daily.   CHOLECALCIFEROL (VITAMIN D) 2000 UNITS TABLET    Take 2,000 Units by mouth daily.   CYANOCOBALAMIN (B-12 COMPLIANCE INJECTION IJ)    Inject 1 mL as directed every 30 (thirty) days.   DIPHENHYDRAMINE-ACETAMINOPHEN (TYLENOL PM) 25-500 MG TABS TABLET    Take 1 tablet by mouth at bedtime as needed.   GABAPENTIN (NEURONTIN) 300 MG CAPSULE    Take 300 mg by mouth 3 (three) times daily.   IRON 66 MG TABS    Take 1 tablet by mouth daily.    LISINOPRIL (ZESTRIL) 5 MG TABLET    Take 1 tablet (5 mg total) by mouth daily as needed (BP over 140/90). Take 1 additional tablet by mouth if your blood pressure is 150/90 in the afternoon   MULTIPLE VITAMIN (MULTIVITAMIN) TABLET    Take 1 tablet by mouth daily.  Modified Medications   No medications on file  Discontinued Medications   No medications on file    Physical Exam:  Vitals:   10/28/20 0953  BP: 124/74  Pulse: 71  Temp: (!) 97.1 F (36.2 C)  TempSrc: Temporal  SpO2: 98%  Weight: 123 lb 6.4 oz (56 kg)  Height: 5' 3.08" (1.602 m)   Body mass index is 21.81 kg/m. Wt Readings from Last 3 Encounters:  10/28/20 123 lb 6.4 oz (56 kg)  07/14/20 133 lb 3.2 oz (60.4 kg)  01/30/20 152 lb 3.2 oz (69 kg)    Physical Exam Constitutional:      General: She is not in acute distress.    Appearance: She is well-developed. She is not diaphoretic.  HENT:     Head: Normocephalic and atraumatic.     Mouth/Throat:     Pharynx: No oropharyngeal exudate.  Eyes:     Conjunctiva/sclera: Conjunctivae normal.     Pupils: Pupils are  equal, round, and reactive to light.  Cardiovascular:     Rate and Rhythm: Normal rate and regular rhythm.     Heart sounds: Normal heart sounds.  Pulmonary:     Effort: Pulmonary effort is normal.     Breath sounds: Normal breath sounds.  Abdominal:     General: Bowel sounds are normal.     Palpations: Abdomen is soft.  Musculoskeletal:     Cervical back: Normal range of motion and neck supple.     Right lower leg: No edema.     Left lower leg: No edema.  Skin:    General: Skin is warm and dry.  Neurological:     Mental Status: She is alert.  Psychiatric:        Mood and Affect: Mood normal.    Labs reviewed: Basic Metabolic Panel: Recent Labs    11/14/19 0937  NA 138  K 4.7  CL 105  CO2 26  GLUCOSE 106*  BUN 25  CREATININE 1.24*  CALCIUM 9.7  TSH 3.20   Liver Function Tests: Recent Labs    11/14/19 0937  AST 30  ALT 17  BILITOT 0.5  PROT 7.0   No results for input(s): LIPASE, AMYLASE in the last 8760 hours. No results for input(s): AMMONIA in the last 8760 hours. CBC: Recent Labs    11/14/19 0937  WBC 5.8  NEUTROABS 3,492  HGB 11.9  HCT 36.0  MCV 93.8  PLT 312   Lipid Panel: Recent Labs    11/14/19 0937  CHOL 160  HDL 62  LDLCALC 79  TRIG 111  CHOLHDL 2.6   TSH: Recent Labs    11/14/19 0937  TSH 3.20   A1C: Lab Results  Component Value Date   HGBA1C 5.5 11/14/2019     Assessment/Plan 1. Essential hypertension --stable. Goal bp <140/90. Continue on lisinipril with low sodium diet.  - COMPLETE METABOLIC PANEL WITH GFR - CBC with Differential/Platelet  2. Gastroesophageal reflux disease, unspecified whether esophagitis present Controlled   3. Lumbar back pain with radiculopathy affecting left lower extremity Stable on gabapentin TID.   4. Primary osteoarthritis involving multiple joints -ongoing, she reports increase pain if not taking celebrex, educated in details on adverse effects on medication. She will decrease to  daily and try to use tylenol before use of celebrex - celecoxib (CELEBREX) 100 MG capsule; Take 1 capsule (100 mg total) by mouth daily.  Dispense: 90 capsule; Refill: 1  5. Senile osteoporosis -has completed treatments in the past with no improvement, she decided to stop all medications.  -Recommended to take calcium 600 mg twice daily with Vitamin D 2000 units daily and weight bearing activity 30 mins/5 days a week - DG Bone Density; Future  6. Chronic kidney disease, stage 3a (HCC) Chronic and stable Encourage proper hydration Follow metabolic panel Avoid nephrotoxic meds (NSAIDS)  7. B12 deficiency due to diet -continue on b12 injections. - CBC with Differential/Platelet  8. Pure hypercholesterolemia -continues on dietary modifications. LDL at goal. - Lipid Panel   Next appt: 6 months. Carlos American. [place], [facility] [phone]

## 2020-10-31 LAB — CBC WITH DIFFERENTIAL/PLATELET
Absolute Monocytes: 482 cells/uL (ref 200–950)
Basophils Absolute: 28 cells/uL (ref 0–200)
Basophils Relative: 0.5 %
Eosinophils Absolute: 213 cells/uL (ref 15–500)
Eosinophils Relative: 3.8 %
HCT: 33.8 % — ABNORMAL LOW (ref 35.0–45.0)
Hemoglobin: 10.9 g/dL — ABNORMAL LOW (ref 11.7–15.5)
Lymphs Abs: 2117 cells/uL (ref 850–3900)
MCH: 30.9 pg (ref 27.0–33.0)
MCHC: 32.2 g/dL (ref 32.0–36.0)
MCV: 95.8 fL (ref 80.0–100.0)
MPV: 9.9 fL (ref 7.5–12.5)
Monocytes Relative: 8.6 %
Neutro Abs: 2761 cells/uL (ref 1500–7800)
Neutrophils Relative %: 49.3 %
Platelets: 250 10*3/uL (ref 140–400)
RBC: 3.53 10*6/uL — ABNORMAL LOW (ref 3.80–5.10)
RDW: 11.8 % (ref 11.0–15.0)
Total Lymphocyte: 37.8 %
WBC: 5.6 10*3/uL (ref 3.8–10.8)

## 2020-10-31 LAB — COMPLETE METABOLIC PANEL WITH GFR
AG Ratio: 1.6 (calc) (ref 1.0–2.5)
ALT: 20 U/L (ref 6–29)
AST: 34 U/L (ref 10–35)
Albumin: 4.2 g/dL (ref 3.6–5.1)
Alkaline phosphatase (APISO): 37 U/L (ref 37–153)
BUN/Creatinine Ratio: 32 (calc) — ABNORMAL HIGH (ref 6–22)
BUN: 35 mg/dL — ABNORMAL HIGH (ref 7–25)
CO2: 24 mmol/L (ref 20–32)
Calcium: 9.4 mg/dL (ref 8.6–10.4)
Chloride: 103 mmol/L (ref 98–110)
Creat: 1.1 mg/dL — ABNORMAL HIGH (ref 0.60–0.95)
Globulin: 2.6 g/dL (calc) (ref 1.9–3.7)
Glucose, Bld: 73 mg/dL (ref 65–99)
Potassium: 4.7 mmol/L (ref 3.5–5.3)
Sodium: 136 mmol/L (ref 135–146)
Total Bilirubin: 0.5 mg/dL (ref 0.2–1.2)
Total Protein: 6.8 g/dL (ref 6.1–8.1)
eGFR: 50 mL/min/{1.73_m2} — ABNORMAL LOW (ref >=60)

## 2020-10-31 LAB — IRON,TIBC AND FERRITIN PANEL
%SAT: 37 % (calc) (ref 16–45)
Ferritin: 238 ng/mL (ref 16–288)
Iron: 113 ug/dL (ref 45–160)
TIBC: 308 mcg/dL (calc) (ref 250–450)

## 2020-10-31 LAB — LIPID PANEL
Cholesterol: 149 mg/dL (ref <200)
HDL: 63 mg/dL (ref >=50)
LDL Cholesterol (Calc): 67 mg/dL (calc)
Non-HDL Cholesterol (Calc): 86 mg/dL (calc) (ref <130)
Total CHOL/HDL Ratio: 2.4 (calc) (ref <5.0)
Triglycerides: 107 mg/dL (ref <150)

## 2020-10-31 LAB — TEST AUTHORIZATION

## 2020-11-02 ENCOUNTER — Ambulatory Visit: Payer: Medicare Other

## 2020-11-03 ENCOUNTER — Ambulatory Visit (INDEPENDENT_AMBULATORY_CARE_PROVIDER_SITE_OTHER): Payer: Medicare Other | Admitting: *Deleted

## 2020-11-03 ENCOUNTER — Other Ambulatory Visit: Payer: Self-pay

## 2020-11-03 DIAGNOSIS — E538 Deficiency of other specified B group vitamins: Secondary | ICD-10-CM | POA: Diagnosis not present

## 2020-11-03 MED ORDER — CYANOCOBALAMIN 1000 MCG/ML IJ SOLN
1000.0000 ug | Freq: Once | INTRAMUSCULAR | Status: AC
  Administered 2020-11-03: 1000 ug via INTRAMUSCULAR

## 2020-11-06 ENCOUNTER — Encounter: Payer: Self-pay | Admitting: Nurse Practitioner

## 2020-11-06 DIAGNOSIS — M85851 Other specified disorders of bone density and structure, right thigh: Secondary | ICD-10-CM | POA: Diagnosis not present

## 2020-11-06 DIAGNOSIS — M810 Age-related osteoporosis without current pathological fracture: Secondary | ICD-10-CM | POA: Diagnosis not present

## 2020-11-06 DIAGNOSIS — M85852 Other specified disorders of bone density and structure, left thigh: Secondary | ICD-10-CM | POA: Diagnosis not present

## 2020-11-09 ENCOUNTER — Telehealth: Payer: Self-pay | Admitting: Nurse Practitioner

## 2020-11-09 NOTE — Telephone Encounter (Signed)
If she would like we could set up a visit to discuss the numbers and treatment options.

## 2020-11-09 NOTE — Telephone Encounter (Signed)
Clarified with Lauree Chandler, NP due to allergy/side effects of prolia in the past (chest pain) question therapy recommended. Jessica re-advised and recommended Reclast (once a year infusion)

## 2020-11-09 NOTE — Telephone Encounter (Signed)
Please call pt and let her know bone density shows worsening bone density in the right hip, left hip and wrist are stable and still considered osteoporosis. Recommended to take calcium 600 mg twice daily with Vitamin D 2000 units daily and weight bearing activity 30 mins/5 days a week. Would like her to consider starting medication prolia twice yearly to help decrease risk of fracture if she is willing to start medication.

## 2020-11-13 ENCOUNTER — Other Ambulatory Visit: Payer: Self-pay

## 2020-11-13 ENCOUNTER — Ambulatory Visit (INDEPENDENT_AMBULATORY_CARE_PROVIDER_SITE_OTHER): Payer: Medicare Other | Admitting: Nurse Practitioner

## 2020-11-13 ENCOUNTER — Encounter: Payer: Self-pay | Admitting: Nurse Practitioner

## 2020-11-13 VITALS — BP 122/78 | HR 68 | Temp 97.8°F | Ht 63.0 in | Wt 123.0 lb

## 2020-11-13 DIAGNOSIS — M810 Age-related osteoporosis without current pathological fracture: Secondary | ICD-10-CM | POA: Diagnosis not present

## 2020-11-13 NOTE — Progress Notes (Signed)
Careteam: Patient Care Team: Lauree Chandler, NP as PCP - General (Geriatric Medicine) Marica Otter, [place] as Consulting Physician (Optometry) Regal, Tamala Fothergill, DPM as Consulting Physician (Podiatry) Debara Pickett, Nadean Corwin, MD as Consulting Physician (Cardiology) Lavonna Monarch, MD as Consulting Physician (Dermatology)  PLACE OF SERVICE:  [place]  Advanced Directive information    Allergies  Allergen Reactions   Prolia [Denosumab] Other (See Comments)    Chest pain   Percocet [Oxycodone-Acetaminophen]     Confusion    Shrimp [Shellfish Allergy]     All seafood    Chief Complaint  Patient presents with   Follow-up    Discuss bone density test results and treatment options      HPI: Patient is a 80 y.o. female to discuss bone density.  Diagnosised in 2020 in osteoporosis. Did not start talking calcium right away.  She has taken fosamax but "hated it" and "will not take again"  Can not say why she will not take it again but reports she will not.  Reports she has done prolia, evenity, forteo and "none have made a difference in bones." Now taking calcium and Vit D.  When she got her bone density in 2020 stopped doing chair yoga because she was worried over fracture.  02/01/2019 was last evenity--she did not have good results. It was decided she would go for reclast in December once a year as her continued therapy which she had in 2020 but did not continue but she is agreeable to continue at this time.    She is out of town until November.   Review of Systems:  Review of Systems  Constitutional:  Negative for chills, fever and weight loss.  HENT:  Negative for tinnitus.   Respiratory:  Negative for cough, sputum production and shortness of breath.   Cardiovascular:  Negative for chest pain, palpitations and leg swelling.  Gastrointestinal:  Negative for abdominal pain, constipation, diarrhea and heartburn.  Genitourinary:  Negative for dysuria, frequency and urgency.   Musculoskeletal:  Negative for back pain, falls, joint pain and myalgias.  Skin: Negative.   Neurological:  Negative for dizziness and headaches.  Psychiatric/Behavioral:  Negative for depression and memory loss. The patient does not have insomnia.    Past Medical History:  Diagnosis Date   Anemia, unspecified    B12 deficiency due to diet    Basal cell carcinoma    LEFT UPPER ARM SUP. TX=CX3 5FU   Cramp of limb    Edema of both legs    Encounter for long-term (current) use of other medications    Lumbago    Melanoma ([place]) 10/18/2010   RIGHT POST SHOULDER =MOHS   Obesity    Osteoarthrosis, unspecified whether generalized or localized, unspecified site    Restless legs syndrome (RLS)    SCC (squamous cell carcinoma) 07/03/2017   RIGHT CHEST CX3 5FU   SCC (squamous cell carcinoma) 01/28/2019   LEFT FOREARM CX3 5FU   SCC (squamous cell carcinoma) [id number]   RIGHT FOREHEAD CX3 5FU   SCC (squamous cell carcinoma) 07/03/2017   RIGHT FOREARM CX3 5FU   SCCA (squamous cell carcinoma) of skin 04/08/2020   Right Anterior Mandible (in situ)(CX35FU)   SCCA (squamous cell carcinoma) of skin 04/08/2020   Left Supraorbital Region (Keratoacanthoma)   Senile osteoporosis    Squamous cell carcinoma of skin 01/30/2017   RIGHT JAWLINE TX WITH BX   Past Surgical History:  Procedure Laterality Date   CATARACT EXTRACTION W/ INTRAOCULAR LENS  IMPLANT, BILATERAL  04/2014   EYE SURGERY Right 04/28/2016   Macular   SPINE SURGERY  2005   STOMACH SURGERY  1981   stapleing    VITRECTOMY Right    with membrane peeling for a macular pucker   Social History:   reports that she quit smoking about 45 years ago. Her smoking use included cigarettes. She has never used smokeless tobacco. She reports that she does not drink alcohol and does not use drugs.  Family History  Problem Relation Age of Onset   Cancer Sister    Cancer Brother     Medications: Patient's Medications  New Prescriptions    No medications on file  Previous Medications   CALCIUM CARBONATE-VIT D-MIN (CALCIUM 1200 PO)    Take 1 tablet by mouth daily.   CELECOXIB (CELEBREX) 100 MG CAPSULE    Take 1 capsule (100 mg total) by mouth daily.   CHOLECALCIFEROL (VITAMIN D) 2000 UNITS TABLET    Take 2,000 Units by mouth daily.   CYANOCOBALAMIN (B-12 COMPLIANCE INJECTION IJ)    Inject 1 mL as directed every 30 (thirty) days.   DIPHENHYDRAMINE-ACETAMINOPHEN (TYLENOL PM) 25-500 MG TABS TABLET    Take 1 tablet by mouth at bedtime as needed.   GABAPENTIN (NEURONTIN) 300 MG CAPSULE    Take 300 mg by mouth 3 (three) times daily.   IRON 66 MG TABS    Take 1 tablet by mouth daily.    LISINOPRIL (ZESTRIL) 5 MG TABLET    Take 1 tablet (5 mg total) by mouth daily as needed (BP over 140/90). Take 1 additional tablet by mouth if your blood pressure is 150/90 in the afternoon   MULTIPLE VITAMIN (MULTIVITAMIN) TABLET    Take 1 tablet by mouth daily.  Modified Medications   No medications on file  Discontinued Medications   No medications on file    Physical Exam:  Vitals:   11/13/20 1405  BP: 122/78  Pulse: 68  Temp: 97.8 F (36.6 C)  TempSrc: Temporal  SpO2: 97%  Weight: 123 lb (55.8 kg)  Height: '5\' 3"'$  (1.6 m)   Body mass index is 21.79 kg/m. Wt Readings from Last 3 Encounters:  11/13/20 123 lb (55.8 kg)  10/28/20 123 lb 6.4 oz (56 kg)  07/14/20 133 lb 3.2 oz (60.4 kg)    Physical Exam Constitutional:      Appearance: Normal appearance.  HENT:     Head: Normocephalic and atraumatic.  Skin:    General: Skin is warm and dry.  Neurological:     Mental Status: She is alert and oriented to person, place, and time. Mental status is at baseline.     Gait: Gait normal.  Psychiatric:        Mood and Affect: Mood normal.        Behavior: Behavior normal.     Labs reviewed: Basic Metabolic Panel: Recent Labs    10/28/20 1049  NA 136  K 4.7  CL 103  CO2 24  GLUCOSE 73  BUN 35*  CREATININE 1.10*  CALCIUM 9.4    Liver Function Tests: Recent Labs    10/28/20 1049  AST 34  ALT 20  BILITOT 0.5  PROT 6.8   No results for input(s): LIPASE, AMYLASE in the last 8760 hours. No results for input(s): AMMONIA in the last 8760 hours. CBC: Recent Labs    10/28/20 1049  WBC 5.6  NEUTROABS 2,761  HGB 10.9*  HCT 33.8*  MCV 95.8  PLT 250  Lipid Panel: Recent Labs    10/28/20 1049  CHOL 149  HDL 63  LDLCALC 67  TRIG 107  CHOLHDL 2.4   TSH: No results for input(s): TSH in the last 8760 hours. A1C: Lab Results  Component Value Date   HGBA1C 5.5 11/14/2019     Assessment/Plan 1. Senile osteoporosis -to continue to take calcium 600 mg twice daily with Vitamin D 2000 units daily and weight bearing activity 30 mins/5 days a week. She is willing to continue reclast. Will complete form and notify her. She will need to come into the office and have BMP to follow up calcium and cr.   Next appt: 01/29/2021 Carlos American. [place], [facility] [phone]

## 2020-11-13 NOTE — Patient Instructions (Signed)
We will be in touch with you in regards to reclast infusion.

## 2020-11-18 ENCOUNTER — Telehealth: Payer: Self-pay

## 2020-11-18 NOTE — Telephone Encounter (Signed)
Outgoing call placed to Short Stay Medical Day in attempts to schedule patient for a reclast infusion in November (patient will be out of town until November).  The items below were faxed to [phone] prior to calling:  Physician Orders Demographics/Insurance Info Medication List   Side note: Patient would like appointment set up for the week of November the 7th or after. M/W/F patient is requesting an early afternoon and on T/Th patient will be available at 11 am or after.   Awaiting return call

## 2020-11-26 DIAGNOSIS — Z20822 Contact with and (suspected) exposure to covid-19: Secondary | ICD-10-CM | POA: Diagnosis not present

## 2020-12-26 DIAGNOSIS — Z20822 Contact with and (suspected) exposure to covid-19: Secondary | ICD-10-CM | POA: Diagnosis not present

## 2020-12-31 DIAGNOSIS — Z23 Encounter for immunization: Secondary | ICD-10-CM | POA: Diagnosis not present

## 2021-01-26 ENCOUNTER — Other Ambulatory Visit: Payer: Self-pay

## 2021-01-26 ENCOUNTER — Telehealth: Payer: Self-pay

## 2021-01-26 DIAGNOSIS — M810 Age-related osteoporosis without current pathological fracture: Secondary | ICD-10-CM

## 2021-01-26 DIAGNOSIS — I10 Essential (primary) hypertension: Secondary | ICD-10-CM

## 2021-01-26 DIAGNOSIS — Z79899 Other long term (current) drug therapy: Secondary | ICD-10-CM

## 2021-01-26 DIAGNOSIS — Z20822 Contact with and (suspected) exposure to covid-19: Secondary | ICD-10-CM | POA: Diagnosis not present

## 2021-01-26 NOTE — Telephone Encounter (Signed)
Fax was received in August 2022 from short stay informing me that patient will need labs closer to the intended appt date of reclast infusion. Per documentation in a previous phone note dated 11/18/2020, patient would like to get reclast set up for the week of 02/01/2021.   Call placed to patient, patient has a pending appointment for Friday 01/29/2021 to get b12 injection and we added notes for her to get a BMP as well, in order to proceed with reclast set up at short-stay. Lab work has to be within in 30 days of the appointment in order for patient to qualify for reclast.   After lab work results back, information will be faxed and call will be placed to short-stay to schedule appointment (see scheduling details in the 11/18/20 phone note).

## 2021-01-29 ENCOUNTER — Other Ambulatory Visit: Payer: Self-pay

## 2021-01-29 ENCOUNTER — Ambulatory Visit (INDEPENDENT_AMBULATORY_CARE_PROVIDER_SITE_OTHER): Payer: Medicare Other | Admitting: *Deleted

## 2021-01-29 DIAGNOSIS — I10 Essential (primary) hypertension: Secondary | ICD-10-CM

## 2021-01-29 DIAGNOSIS — Z79899 Other long term (current) drug therapy: Secondary | ICD-10-CM | POA: Diagnosis not present

## 2021-01-29 DIAGNOSIS — M810 Age-related osteoporosis without current pathological fracture: Secondary | ICD-10-CM

## 2021-01-29 DIAGNOSIS — E538 Deficiency of other specified B group vitamins: Secondary | ICD-10-CM | POA: Diagnosis not present

## 2021-01-29 MED ORDER — CYANOCOBALAMIN 1000 MCG/ML IJ SOLN
1000.0000 ug | Freq: Once | INTRAMUSCULAR | Status: AC
  Administered 2021-01-29: 1000 ug via INTRAMUSCULAR

## 2021-01-30 LAB — BASIC METABOLIC PANEL WITH GFR
BUN/Creatinine Ratio: 25 (calc) — ABNORMAL HIGH (ref 6–22)
BUN: 26 mg/dL — ABNORMAL HIGH (ref 7–25)
CO2: 24 mmol/L (ref 20–32)
Calcium: 9.7 mg/dL (ref 8.6–10.4)
Chloride: 102 mmol/L (ref 98–110)
Creat: 1.06 mg/dL — ABNORMAL HIGH (ref 0.60–0.95)
Glucose, Bld: 80 mg/dL (ref 65–99)
Potassium: 4.3 mmol/L (ref 3.5–5.3)
Sodium: 137 mmol/L (ref 135–146)
eGFR: 52 mL/min/{1.73_m2} — ABNORMAL LOW (ref >=60)

## 2021-02-02 ENCOUNTER — Telehealth: Payer: Self-pay

## 2021-02-02 NOTE — Telephone Encounter (Signed)
Faxed orders to [phone] for reclast infusion.  Called [phone] and left a detailed message requesting a return call to be placed directly to the patient or to me to scheduled patient for the first available appointment.   Side note: Patient would like appointment set up for the week of November the 7th or after. M/W/F patient is requesting an early afternoon and on T/Th patient will be available at 11 am or after.  Awaiting a return call from Short Stay

## 2021-02-04 ENCOUNTER — Ambulatory Visit (INDEPENDENT_AMBULATORY_CARE_PROVIDER_SITE_OTHER): Payer: Medicare Other

## 2021-02-04 ENCOUNTER — Other Ambulatory Visit: Payer: Self-pay

## 2021-02-04 ENCOUNTER — Encounter: Payer: Self-pay | Admitting: Orthopaedic Surgery

## 2021-02-04 ENCOUNTER — Ambulatory Visit (INDEPENDENT_AMBULATORY_CARE_PROVIDER_SITE_OTHER): Payer: Medicare Other | Admitting: Orthopaedic Surgery

## 2021-02-04 DIAGNOSIS — G8929 Other chronic pain: Secondary | ICD-10-CM

## 2021-02-04 DIAGNOSIS — M545 Low back pain, unspecified: Secondary | ICD-10-CM

## 2021-02-04 DIAGNOSIS — M25562 Pain in left knee: Secondary | ICD-10-CM

## 2021-02-04 NOTE — Progress Notes (Signed)
Office Visit Note   Patient: Jody [name]           Date of Birth: [birth year]           MRN: [id number] Visit Date: 02/04/2021              Requested by: Lauree Chandler, NP [place],  [place] [address] PCP: Lauree Chandler, NP   Assessment & Plan: Visit Diagnoses:  1. Chronic pain of left knee   2. Low back pain, unspecified back pain laterality, unspecified chronicity, unspecified whether sciatica present     Plan: Impression is left knee advanced degenerative joint disease and left proximal hamstring tendinitis.  In regards to her knee, we discussed cortisone injection in addition to eventual total knee replacement.  She is not interested in surgery at this point in time but has elected to proceed with injection today.  In regards to the hamstring, she is really not having any pain with this.  She may continue with exercise as tolerated.  She will follow-up with [place] as needed.  Follow-Up Instructions: Return if symptoms worsen or fail to improve.   Orders:  Orders Placed This Encounter  Procedures   XR KNEE 3 VIEW LEFT   XR Lumbar Spine 2-3 Views   No orders of the defined types were placed in this encounter.     Procedures: No procedures performed   Clinical Data: No additional findings.   Subjective: Chief Complaint  Patient presents with   Lower Back - Pain   Right Knee - Pain   Left Knee - Pain    HPI patient is a pleasant 80 year old female who comes in today with left knee pain and left hip popping.  In regards to the left knee, she is status post left patella fracture back in 2017.  This was treated nonoperatively.  She has had pain ever since.  The pain she has is retropatellar and occurs with activity and occasionally at night.  She takes gabapentin and Celebrex without significant relief.  No previous cortisone injection.  In regards to the left hip, she describes a popping/snapping sensation to the proximal hamstring attachment which  occurs with certain movements of the leg which occasionally occurs during water aerobics.  She denies any associated pain.  No weakness.  Review of Systems as detailed in HPI.  All others reviewed and negative.   Objective: Vital Signs: There were no vitals taken for this visit.  Physical Exam well-developed and well-nourished female in no acute distress.  Alert and oriented x3.  Ortho Exam left knee exam shows a trace effusion.  Range of motion 0 to 100 degrees.  Lateral joint line tenderness.  Moderate patellofemoral crepitus.  She is neurovascular intact distally.  Right hip exam shows a snapping sensation to the proximal hamstring.  Negative logroll and negative FADIR.  No pain or weakness with the range of motion or with resisted flexion or extension.  Specialty Comments:  No specialty comments available.  Imaging: XR KNEE 3 VIEW LEFT  Result Date: 02/04/2021 X-rays demonstrate significant degenerative changes to all 3 compartments worse in the patellofemoral compartment there appears to be an previous fracture to the patella  XR Lumbar Spine 2-3 Views  Result Date: 02/04/2021 X-rays demonstrate advanced multilevel degenerative changes.    PMFS History: Patient Active Problem List   Diagnosis Date Noted   Pure hypercholesterolemia 11/14/2019   Hyperglycemia 11/14/2019   Decreased visual acuity 11/14/2019   Other fatigue  11/14/2019   Positional lightheadedness 11/14/2019   Gastroesophageal reflux disease 02/04/2019   BMI 32.0-32.9,adult 02/12/2018   Obesity (BMI 30.0-34.9) 07/17/2017   Impingement syndrome of right shoulder 07/04/2016   Chronic pain of both knees 07/04/2016   Lumbar back pain with radiculopathy affecting left lower extremity 04/11/2016   Displaced transverse fracture of left patella, subsequent encounter for closed fracture with delayed healing 02/03/2016   Finger fracture, left 02/03/2016   Leg cramps, sleep related 11/19/2015   Cervical spondylosis  without myelopathy 06/08/2015   Osteoarthritis of finger of right hand 06/08/2015   Absolute anemia 05/16/2014   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 TX=CX3 5FU   Cramp of limb    Edema of both legs    Encounter for long-term (current) use of other medications    Lumbago    Melanoma ([place]) 10/18/2010   RIGHT POST SHOULDER =MOHS   Obesity    Osteoarthrosis, unspecified whether generalized or localized, unspecified site    Restless legs syndrome (RLS)    SCC (squamous cell carcinoma) 07/03/2017   RIGHT CHEST CX3 5FU   SCC (squamous cell carcinoma) 01/28/2019   LEFT FOREARM CX3 5FU   SCC (squamous cell carcinoma) [id number]   RIGHT FOREHEAD CX3 5FU   SCC (squamous cell carcinoma) 07/03/2017   RIGHT FOREARM CX3 5FU   SCCA (squamous cell carcinoma) of skin 04/08/2020   Right Anterior Mandible (in situ)(CX35FU)   SCCA (squamous cell carcinoma) of skin 04/08/2020   Left Supraorbital Region (Keratoacanthoma)   Senile osteoporosis    Squamous cell carcinoma of skin 01/30/2017   RIGHT JAWLINE TX WITH BX    Family History  Problem Relation Age of Onset   Cancer Sister    Cancer Brother     Past Surgical History:  Procedure Laterality Date   CATARACT EXTRACTION W/ INTRAOCULAR LENS  IMPLANT, BILATERAL  04/2014   EYE SURGERY Right 04/28/2016   Macular   SPINE SURGERY  2005   STOMACH SURGERY  1981   stapleing    VITRECTOMY Right    with membrane peeling for a macular pucker   Social History   Occupational History   Occupation: Retired Manufacturing engineer: [employer]  Tobacco Use   Smoking status: Former    Types: Cigarettes    Quit date: 05/22/1975     Years since quitting: 45.7   Smokeless tobacco: Never   Tobacco comments:    Quit in early 30's  Vaping Use   Vaping Use: Never used  Substance and Sexual Activity   Alcohol use: No    Alcohol/week: 0.0 standard drinks   Drug use: No   Sexual activity: Never

## 2021-02-09 NOTE — Telephone Encounter (Signed)
Patient called last Thursday stating someone had left her a message about scheduling an appointment for reclast. Patient was given the number to [facility] Short Stay by Kathyrn Lass (administrative team member). I was nearby and overheard the conversation.  I checked in the system today to confirm that patient had returned call to Short Stay and  scheduled appointment yet she had not.  Call placed to patient, patient states she had forgot to call them and asked that I send her a mychart message with the phone number. Message sent as requested

## 2021-02-22 ENCOUNTER — Other Ambulatory Visit (HOSPITAL_COMMUNITY): Payer: Self-pay | Admitting: *Deleted

## 2021-02-23 ENCOUNTER — Ambulatory Visit (HOSPITAL_COMMUNITY)
Admission: RE | Admit: 2021-02-23 | Discharge: 2021-02-23 | Disposition: A | Discharge status: Home / Self Care | Payer: Medicare Other | Source: Ambulatory Visit | Attending: Nurse Practitioner | Admitting: Nurse Practitioner

## 2021-02-23 ENCOUNTER — Other Ambulatory Visit: Payer: Self-pay

## 2021-02-23 DIAGNOSIS — M810 Age-related osteoporosis without current pathological fracture: Secondary | ICD-10-CM | POA: Diagnosis not present

## 2021-02-23 MED ORDER — ZOLEDRONIC ACID 5 MG/100ML IV SOLN
5.0000 mg | Freq: Once | INTRAVENOUS | Status: AC
  Administered 2021-02-23: 5 mg via INTRAVENOUS

## 2021-03-03 ENCOUNTER — Ambulatory Visit (INDEPENDENT_AMBULATORY_CARE_PROVIDER_SITE_OTHER): Payer: Medicare Other

## 2021-03-03 ENCOUNTER — Other Ambulatory Visit: Payer: Self-pay

## 2021-03-03 DIAGNOSIS — E538 Deficiency of other specified B group vitamins: Secondary | ICD-10-CM | POA: Diagnosis not present

## 2021-03-03 MED ORDER — CYANOCOBALAMIN 1000 MCG/ML IJ SOLN
1000.0000 ug | Freq: Once | INTRAMUSCULAR | Status: AC
  Administered 2021-03-03: 1000 ug via INTRAMUSCULAR

## 2021-04-05 ENCOUNTER — Ambulatory Visit (INDEPENDENT_AMBULATORY_CARE_PROVIDER_SITE_OTHER): Payer: Medicare Other | Admitting: *Deleted

## 2021-04-05 ENCOUNTER — Telehealth: Payer: Self-pay | Admitting: *Deleted

## 2021-04-05 ENCOUNTER — Other Ambulatory Visit: Payer: Self-pay

## 2021-04-05 DIAGNOSIS — E538 Deficiency of other specified B group vitamins: Secondary | ICD-10-CM

## 2021-04-05 MED ORDER — CYANOCOBALAMIN 1000 MCG/ML IJ SOLN
1000.0000 ug | Freq: Once | INTRAMUSCULAR | Status: AC
  Administered 2021-04-05: 1000 ug via INTRAMUSCULAR

## 2021-04-05 NOTE — Telephone Encounter (Signed)
Patient stated that she has always gotten this in the past to carry with her just in case. Stated that is hasn't ever been a problem before. Patient really would like a Rx to carry to ease her mind just in case she picks something up traveling internationally.   Would like for you to reconsider.

## 2021-04-05 NOTE — Telephone Encounter (Signed)
Again this is not something we can do.

## 2021-04-05 NOTE — Telephone Encounter (Signed)
Patient came in for her Vitamin B12 injection today and stated that she is going out of town to [place] and would like to get a Rx of Cipro to have on hand in her "What if" bag.   Would like a Rx sent to [facility].   Please Advise.

## 2021-04-05 NOTE — Telephone Encounter (Signed)
Unfortunately we are unable to send in prescription for antibiotics without a reason. If she has an issue she could call us or go to the urgent care.

## 2021-04-05 NOTE — Telephone Encounter (Signed)
Patient scheduled a MyChart Visit with Janett Billow for tomorrow to discuss.

## 2021-04-06 ENCOUNTER — Telehealth: Payer: Self-pay

## 2021-04-06 ENCOUNTER — Telehealth (INDEPENDENT_AMBULATORY_CARE_PROVIDER_SITE_OTHER): Payer: Medicare Other | Admitting: Nurse Practitioner

## 2021-04-06 DIAGNOSIS — A09 Infectious gastroenteritis and colitis, unspecified: Secondary | ICD-10-CM | POA: Diagnosis not present

## 2021-04-06 MED ORDER — AZITHROMYCIN 500 MG PO TABS: 500.0000 mg | ORAL_TABLET | Freq: Every day | ORAL | 0 refills | Status: DC

## 2021-04-06 NOTE — Progress Notes (Signed)
This service is provided via telemedicine  No vital signs collected/recorded due to the encounter was a telemedicine visit.   Location of patient (ex: home, work):  Home  Patient consents to a telephone visit:  Yes, see encounter dated 04/06/2021  Location of the provider (ex: office, home):  [place]  Name of any referring provider:  N/A  Names of all persons participating in the telemedicine service and their role in the encounter:  Sherrie Mustache, Nurse Practitioner, Carroll Kinds, CMA, and patient.   Time spent on call:  5 minutes with medical assistant

## 2021-04-06 NOTE — Telephone Encounter (Signed)
[name], Jody [name] are scheduled for a virtual visit with your provider today.    Just as we do with appointments in the office, we must obtain your consent to participate.  Your consent will be active for this visit and any virtual visit you may have with one of our providers in the next 365 days.    If you have a MyChart account, I can also send a copy of this consent to you electronically.  All virtual visits are billed to your insurance company just like a traditional visit in the office.  As this is a virtual visit, video technology does not allow for your provider to perform a traditional examination.  This may limit your provider's ability to fully assess your condition.  If your provider identifies any concerns that need to be evaluated in person or the need to arrange testing such as labs, EKG, etc, we will make arrangements to do so.    Although advances in technology are sophisticated, we cannot ensure that it will always work on either your end or our end.  If the connection with a video visit is poor, we may have to switch to a telephone visit.  With either a video or telephone visit, we are not always able to ensure that we have a secure connection.   I need to obtain your verbal consent now.   Are you willing to proceed with your visit today?   Jody [name] has provided verbal consent on 04/06/2021 for a virtual visit (video or telephone).   Carroll Kinds, CMA 04/06/2021  4:00 PM

## 2021-04-06 NOTE — Progress Notes (Signed)
Careteam: Patient Care Team: Lauree Chandler, NP as PCP - General (Geriatric Medicine) Marica Otter, [place] as Consulting Physician (Optometry) Regal, Tamala Fothergill, DPM as Consulting Physician (Podiatry) Debara Pickett, Nadean Corwin, MD as Consulting Physician (Cardiology) Lavonna Monarch, MD as Consulting Physician (Dermatology)  Advanced Directive information    Allergies  Allergen Reactions   Shrimp [Shellfish Allergy] Anaphylaxis    All seafood   Prolia [Denosumab] Other (See Comments)    Chest pain   Percocet [Oxycodone-Acetaminophen]     Confusion     Chief Complaint  Patient presents with   Acute Visit    Patient would like antibiotic for travel. Patient is leaving January 31st. Patient needs antibiotic for international travel. Patient needs medication called in because she is not going to be near pharmacy for 10 days.     HPI: Patient is a 81 y.o. female for telephone visit.   She travels in remote setting when she travels internationally in case she gets travelers diarrhea.  She has had travelers diarrhea once and was very grateful that she had prescription.  She will not be close to a pharmacy during her trip since she was in remote setting.  She will be going to [place] and several other places.   She has hx of digestive issues and likes to be prepared.    Review of Systems:  Review of Systems  Constitutional:  Negative for chills and fever.  Gastrointestinal:  Negative for abdominal pain, constipation, diarrhea, heartburn, nausea and vomiting.   Past Medical History:  Diagnosis Date   Anemia, unspecified    B12 deficiency due to diet    Basal cell carcinoma    LEFT UPPER ARM SUP. 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/08/2020   Right Anterior Mandible (in situ)(CX35FU)   SCCA (squamous cell carcinoma) of skin 04/08/2020   Left Supraorbital Region (Keratoacanthoma)   Senile osteoporosis    Squamous cell carcinoma of skin 01/30/2017   RIGHT JAWLINE TX WITH BX   Past Surgical History:  Procedure Laterality Date   CATARACT EXTRACTION W/ INTRAOCULAR LENS  IMPLANT, BILATERAL  04/2014   EYE SURGERY Right 04/28/2016   Macular   SPINE SURGERY  2005   STOMACH SURGERY  1981   stapleing    VITRECTOMY Right    with membrane peeling for a macular pucker   Social History:   reports that she quit smoking about 45 years ago. Her smoking use included cigarettes. She has never used smokeless tobacco. She reports that she does not drink alcohol and does not use drugs.  Family History  Problem Relation Age of Onset   Cancer Sister    Cancer Brother     Medications: Patient's Medications  New Prescriptions   No medications on file  Previous Medications   CALCIUM CARBONATE-VIT D-MIN (  CALCIUM 1200 PO)    Take 1 tablet by mouth daily.   CELECOXIB (CELEBREX) 100 MG CAPSULE    Take 1 capsule (100 mg total) by mouth daily.   CHOLECALCIFEROL (VITAMIN D) 2000 UNITS TABLET    Take 2,000 Units by mouth daily.   CYANOCOBALAMIN (B-12 COMPLIANCE INJECTION IJ)    Inject 1 mL as directed every 30 (thirty) days.   DIPHENHYDRAMINE-ACETAMINOPHEN (TYLENOL PM) 25-500 MG TABS TABLET    Take 1 tablet by mouth at bedtime as needed.   GABAPENTIN (NEURONTIN) 300 MG CAPSULE    Take 300 mg by mouth 3 (three) times daily.   IRON 66 MG TABS    Take 1 tablet by mouth daily.    LISINOPRIL (ZESTRIL) 5  MG TABLET    Take 1 tablet (5 mg total) by mouth daily as needed (BP over 140/90). Take 1 additional tablet by mouth if your blood pressure is 150/90 in the afternoon   MULTIPLE VITAMIN (MULTIVITAMIN) TABLET    Take 1 tablet by mouth daily.   ZOLEDRONIC ACID (RECLAST IV)      Modified Medications   No medications on file  Discontinued Medications   No medications on file    Physical Exam:  There were no vitals filed for this visit. There is no height or weight on file to calculate BMI. Wt Readings from Last 3 Encounters:  02/23/21 115 lb 8 oz (52.4 kg)  11/13/20 123 lb (55.8 kg)  10/28/20 123 lb 6.4 oz (56 kg)     Labs reviewed: Basic Metabolic Panel: Recent Labs    10/28/20 1049 01/29/21 1021  NA 136 137  K 4.7 4.3  CL 103 102  CO2 24 24  GLUCOSE 73 80  BUN 35* 26*  CREATININE 1.10* 1.06*  CALCIUM 9.4 9.7   Liver Function Tests: Recent Labs    10/28/20 1049  AST 34  ALT 20  BILITOT 0.5  PROT 6.8   No results for input(s): LIPASE, AMYLASE in the last 8760 hours. No results for input(s): AMMONIA in the last 8760 hours. CBC: Recent Labs    10/28/20 1049  WBC 5.6  NEUTROABS 2,761  HGB 10.9*  HCT 33.8*  MCV 95.8  PLT 250   Lipid Panel: Recent Labs    10/28/20 1049  CHOL 149  HDL 63  LDLCALC 67  TRIG 107  CHOLHDL 2.4   TSH: No results for input(s): TSH in the last 8760 hours. A1C: Lab Results  Component Value Date   HGBA1C 5.5 11/14/2019     Assessment/Plan 1. Traveler's diarrhea -for travels as she is going internationally and will not have access to pharmacy or healthcare. Likes to be prepared as she has had travelers diarrhea in the past.  Also recommended florastor daily  - azithromycin (ZITHROMAX) 500 MG tablet; Take 1 tablet (500 mg total) by mouth daily. For 3 days if needed for travelers diarrhea  Dispense: 3 tablet; Refill: 0  Srikar Chiang K. Harle Battiest  [facility] [phone]    Virtual Visit via  telephone  I connected with patient on 04/06/21 at  4:15 PM EST by telephone and verified that I am speaking with the correct person using two identifiers.  Location: Patient: home Provider: [place]   I discussed the limitations, risks, security and privacy concerns of performing an evaluation and management service by telephone and the availability of in person appointments. I also discussed with the patient that there may be a patient responsible  charge related to this service. The patient expressed understanding and agreed to proceed.   I discussed the assessment and treatment plan with the patient. The patient was provided an opportunity to ask questions and all were answered. The patient agreed with the plan and demonstrated an understanding of the instructions.   The patient was advised to call back or seek an in-person evaluation if the symptoms worsen or if the condition fails to improve as anticipated.  I provided 14 minutes of non-face-to-face time during this encounter.  Carlos American. Harle Battiest Avs printed and mailed

## 2021-04-17 ENCOUNTER — Encounter: Payer: Self-pay | Admitting: Nurse Practitioner

## 2021-05-14 ENCOUNTER — Ambulatory Visit: Payer: Medicare Other | Admitting: Nurse Practitioner

## 2021-05-17 ENCOUNTER — Ambulatory Visit: Payer: Medicare Other

## 2021-05-24 ENCOUNTER — Other Ambulatory Visit: Payer: Self-pay

## 2021-05-24 ENCOUNTER — Ambulatory Visit (INDEPENDENT_AMBULATORY_CARE_PROVIDER_SITE_OTHER): Payer: Medicare Other | Admitting: Nurse Practitioner

## 2021-05-24 ENCOUNTER — Encounter: Payer: Self-pay | Admitting: Nurse Practitioner

## 2021-05-24 VITALS — BP 132/78 | HR 69 | Temp 97.9°F | Ht 63.0 in | Wt 116.8 lb

## 2021-05-24 DIAGNOSIS — G629 Polyneuropathy, unspecified: Secondary | ICD-10-CM

## 2021-05-24 DIAGNOSIS — M4802 Spinal stenosis, cervical region: Secondary | ICD-10-CM

## 2021-05-24 DIAGNOSIS — E538 Deficiency of other specified B group vitamins: Secondary | ICD-10-CM

## 2021-05-24 DIAGNOSIS — I10 Essential (primary) hypertension: Secondary | ICD-10-CM | POA: Diagnosis not present

## 2021-05-24 DIAGNOSIS — M159 Polyosteoarthritis, unspecified: Secondary | ICD-10-CM

## 2021-05-24 DIAGNOSIS — M5416 Radiculopathy, lumbar region: Secondary | ICD-10-CM | POA: Diagnosis not present

## 2021-05-24 DIAGNOSIS — M810 Age-related osteoporosis without current pathological fracture: Secondary | ICD-10-CM | POA: Diagnosis not present

## 2021-05-24 DIAGNOSIS — N1831 Chronic kidney disease, stage 3a: Secondary | ICD-10-CM

## 2021-05-24 MED ORDER — CYANOCOBALAMIN 1000 MCG/ML IJ SOLN
1000.0000 ug | Freq: Once | INTRAMUSCULAR | Status: AC
  Administered 2021-05-24: 1000 ug via INTRAMUSCULAR

## 2021-05-24 MED ORDER — CELECOXIB 100 MG PO CAPS: 100.0000 mg | ORAL_CAPSULE | Freq: Every day | ORAL | 3 refills | Status: DC

## 2021-05-24 NOTE — Patient Instructions (Addendum)
Increase fiber in diet  Also add benefiber, metamucil 1-2 times daily

## 2021-05-24 NOTE — Progress Notes (Signed)
Careteam: Patient Care Team: Lauree Chandler, NP as PCP - General (Geriatric Medicine) Marica Otter, [place] as Consulting Physician (Optometry) Regal, Tamala Fothergill, DPM as Consulting Physician (Podiatry) Debara Pickett, Nadean Corwin, MD as Consulting Physician (Cardiology) Lavonna Monarch, MD as Consulting Physician (Dermatology)  PLACE OF SERVICE:  [place] Directive information Does Patient Have a Medical Advance Directive?: Yes, Type of Advance Directive: Living will;Healthcare Power of Attorney, Does patient want to make changes to medical advance directive?: No - Patient declined  Allergies  Allergen Reactions   Shrimp [Shellfish Allergy] Anaphylaxis    All seafood   Prolia [Denosumab] Other (See Comments)    Chest pain   Percocet [Oxycodone-Acetaminophen]     Confusion     Chief Complaint  Patient presents with   Medical Management of Chronic Issues    6 month follow-up and b12 injection. FYI patient has to get brand name Celebrex. Patient needs printed rx to send to [place]. Patient states she is taking twice daily versus once daily.      HPI: Patient is a 81 y.o. female for routine follow up.   Osteoporosis- was agreeable for reclast.   Osteoarthritis- uses celebrex to control pain- aware of risk. She has been taking twice daily vs once daily. She can not miss her medication or she aches.  Has gotten injection to knees which has been very beneficial.   Chronic back pains- continues on tylenol and gabapentin.   She feel out of the attic in 2008 and now has numbness in arm/hands. Burning, decrease ROM. Unsure what causes it to happen but when it hits she will drop anything she is holding.  Wants ideas to help manage.   B12 def- continues on injection   Htn- on lisinopril 5 mg daily   Anemia- on b12 and iron supplement.   Having a hard time with bowel control.  Reports eating habits are terrible. Has a hard time with vegetables.    Review of  Systems:  Review of Systems  Constitutional:  Negative for chills, fever and weight loss.  HENT:  Negative for tinnitus.   Respiratory:  Negative for cough, sputum production and shortness of breath.   Cardiovascular:  Negative for chest pain, palpitations and leg swelling.  Gastrointestinal:  Positive for diarrhea. Negative for abdominal pain, constipation and heartburn.  Genitourinary:  Negative for dysuria, frequency and urgency.  Musculoskeletal:  Negative for back pain, falls, joint pain and myalgias.  Skin: Negative.   Neurological:  Positive for tingling and sensory change. Negative for dizziness and headaches.  Psychiatric/Behavioral:  Negative for depression and memory loss. The patient does not have insomnia.    Past Medical History:  Diagnosis Date   Anemia, unspecified    B12 deficiency due to diet    Basal cell carcinoma    LEFT UPPER ARM SUP. TX=CX3 5FU   Cramp of limb    Edema of both legs    Encounter for long-term (current) use of other medications    Lumbago    Melanoma ([place]) 10/18/2010   RIGHT POST SHOULDER =MOHS   Obesity    Osteoarthrosis, unspecified whether generalized or localized, unspecified site    Restless legs syndrome (RLS)    SCC (squamous cell carcinoma) 07/03/2017   RIGHT CHEST CX3 5FU   SCC (squamous cell carcinoma) 01/28/2019   LEFT FOREARM CX3 5FU   SCC (squamous cell carcinoma) [id number]   RIGHT FOREHEAD CX3 5FU   SCC (squamous cell carcinoma) 07/03/2017  RIGHT FOREARM CX3 5FU   SCCA (squamous cell carcinoma) of skin 04/08/2020   Right Anterior Mandible (in situ)(CX35FU)   SCCA (squamous cell carcinoma) of skin 04/08/2020   Left Supraorbital Region (Keratoacanthoma)   Senile osteoporosis    Squamous cell carcinoma of skin 01/30/2017   RIGHT JAWLINE TX WITH BX   Past Surgical History:  Procedure Laterality Date   CATARACT EXTRACTION W/ INTRAOCULAR LENS  IMPLANT, BILATERAL  04/2014   EYE SURGERY Right 04/28/2016   Macular   SPINE  SURGERY  2005   STOMACH SURGERY  1981   stapleing    VITRECTOMY Right    with membrane peeling for a macular pucker   Social History:   reports that she quit smoking about 47 years ago. Her smoking use included cigarettes. She has never used smokeless tobacco. She reports that she does not drink alcohol and does not use drugs.  Family History  Problem Relation Age of Onset   Cancer Sister    Cancer Brother     Medications: Patient's Medications  New Prescriptions   No medications on file  Previous Medications   CALCIUM CARBONATE-VIT D-MIN (CALCIUM 1200 PO)    Take 1 tablet by mouth daily.   CELECOXIB (CELEBREX) 100 MG CAPSULE    Take 1 capsule (100 mg total) by mouth daily.   CHOLECALCIFEROL (VITAMIN D) 2000 UNITS TABLET    Take 2,000 Units by mouth daily.   CYANOCOBALAMIN (B-12 COMPLIANCE INJECTION IJ)    Inject 1 mL as directed every 30 (thirty) days.   DIPHENHYDRAMINE-ACETAMINOPHEN (TYLENOL PM) 25-500 MG TABS TABLET    Take 1 tablet by mouth at bedtime as needed.   GABAPENTIN (NEURONTIN) 300 MG CAPSULE    Take 300 mg by mouth 3 (three) times daily.   IRON 66 MG TABS    Take 1 tablet by mouth daily.    LISINOPRIL (ZESTRIL) 5 MG TABLET    Take 5 mg by mouth daily.   MULTIPLE VITAMIN (MULTIVITAMIN) TABLET    Take 1 tablet by mouth daily.   ZOLEDRONIC ACID (RECLAST IV)      Modified Medications   No medications on file  Discontinued Medications   AZITHROMYCIN (ZITHROMAX) 500 MG TABLET    Take 1 tablet (500 mg total) by mouth daily. For 3 days if needed for travelers diarrhea   LISINOPRIL (ZESTRIL) 5 MG TABLET    Take 1 tablet (5 mg total) by mouth daily as needed (BP over 140/90). Take 1 additional tablet by mouth if your blood pressure is 150/90 in the afternoon    Physical Exam:  Vitals:   05/24/21 1343  BP: 132/78  Pulse: 69  Temp: 97.9 F (36.6 C)  TempSrc: Temporal  SpO2: 97%  Weight: 116 lb 12.8 oz (53 kg)  Height: _0  (1.6 m)   Body mass index is 20.69  kg/m. Wt Readings from Last 3 Encounters:  05/24/21 116 lb 12.8 oz (53 kg)  02/23/21 115 lb 8 oz (52.4 kg)  11/13/20 123 lb (55.8 kg)    Physical Exam Constitutional:      General: She is not in acute distress.    Appearance: She is well-developed. She is not diaphoretic.  HENT:     Head: Normocephalic and atraumatic.     Mouth/Throat:     Pharynx: No oropharyngeal exudate.  Eyes:     Conjunctiva/sclera: Conjunctivae normal.     Pupils: Pupils are equal, round, and reactive to light.  Cardiovascular:     Rate  and Rhythm: Normal rate and regular rhythm.     Heart sounds: Normal heart sounds.  Pulmonary:     Effort: Pulmonary effort is normal.     Breath sounds: Normal breath sounds.  Abdominal:     General: Bowel sounds are normal.     Palpations: Abdomen is soft.  Musculoskeletal:     Cervical back: Normal range of motion and neck supple.     Right lower leg: No edema.     Left lower leg: No edema.  Skin:    General: Skin is warm and dry.  Neurological:     Mental Status: She is alert and oriented to person, place, and time. Mental status is at baseline.     Cranial Nerves: No cranial nerve deficit.     Sensory: No sensory deficit.     Motor: No weakness.     Coordination: Coordination normal.     Gait: Gait normal.     Deep Tendon Reflexes: Reflexes normal.  Psychiatric:        Mood and Affect: Mood normal.    Labs reviewed: Basic Metabolic Panel: Recent Labs    10/28/20 1049 01/29/21 1021  NA 136 137  K 4.7 4.3  CL 103 102  CO2 24 24  GLUCOSE 73 80  BUN 35* 26*  CREATININE 1.10* 1.06*  CALCIUM 9.4 9.7   Liver Function Tests: Recent Labs    10/28/20 1049  AST 34  ALT 20  BILITOT 0.5  PROT 6.8   No results for input(s): LIPASE, AMYLASE in the last 8760 hours. No results for input(s): AMMONIA in the last 8760 hours. CBC: Recent Labs    10/28/20 1049  WBC 5.6  NEUTROABS 2,761  HGB 10.9*  HCT 33.8*  MCV 95.8  PLT 250   Lipid  Panel: Recent Labs    10/28/20 1049  CHOL 149  HDL 63  LDLCALC 67  TRIG 107  CHOLHDL 2.4   TSH: No results for input(s): TSH in the last 8760 hours. A1C: Lab Results  Component Value Date   HGBA1C 5.5 11/14/2019     Assessment/Plan 1. B12 deficiency due to diet - cyanocobalamin ((VITAMIN B-12)) injection 1,000 mcg - Vitamin B12  2. 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 Senile osteoporosis -continue reclast yearly with calcium 600 mg twice daily with Vitamin D 2000 units daily and weight bearing activity 30 mins/5 days a week  4. Essential hypertension -Blood pressure well controlled Continue current medications Recheck metabolic panel - CBC with Differential/Platelet - CMP with eGFR([facility])  5. Primary osteoarthritis involving multiple joints -discussed adverse effects long term use in NSAIDS, risk vs benefits and she already has CKD, however she reports quality of life is diminished without medication. Will decrease to once daily (taking twice at this time) - celecoxib (CELEBREX) 100 MG capsule; Take 1 capsule (100 mg total) by mouth daily.  Dispense: 90 capsule; Refill: 3  6. Benign essential hypertension -Blood pressure well controlled Continue current medications Recheck metabolic panel  7. Cervical stenosis of spine With numbness in fingers on left hand, unable to reproduce today, will have her evaluated by neurosurgery at this time to help manage symptoms.  - Ambulatory referral to Neurosurgery  8. 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 Chronic kidney disease, stage 3a ([place]) -Chronic and stable Encourage proper hydration Follow metabolic panel Avoid nephrotoxic meds (  NSAIDS)- discuss with her on risk of cont use of celebrex  10. Diarrhea -has been on and off, can not associate with food, to increase  fiber intake at this time, will follow up at next follow up, GI referral if needed for symptoms management  3 month for routine follow up  [place]. [place], [facility] [phone]

## 2021-05-25 LAB — COMPLETE METABOLIC PANEL WITH GFR
AG Ratio: 1.7 (calc) (ref 1.0–2.5)
ALT: 23 U/L (ref 6–29)
AST: 45 U/L — ABNORMAL HIGH (ref 10–35)
Albumin: 4.4 g/dL (ref 3.6–5.1)
Alkaline phosphatase (APISO): 36 U/L — ABNORMAL LOW (ref 37–153)
BUN: 19 mg/dL (ref 7–25)
CO2: 26 mmol/L (ref 20–32)
Calcium: 10.2 mg/dL (ref 8.6–10.4)
Chloride: 97 mmol/L — ABNORMAL LOW (ref 98–110)
Creat: 0.93 mg/dL (ref 0.60–0.95)
Globulin: 2.6 g/dL (calc) (ref 1.9–3.7)
Glucose, Bld: 101 mg/dL (ref 65–139)
Potassium: 4.6 mmol/L (ref 3.5–5.3)
Sodium: 133 mmol/L — ABNORMAL LOW (ref 135–146)
Total Bilirubin: 0.5 mg/dL (ref 0.2–1.2)
Total Protein: 7 g/dL (ref 6.1–8.1)
eGFR: 61 mL/min/{1.73_m2} (ref >=60)

## 2021-05-25 LAB — CBC WITH DIFFERENTIAL/PLATELET
Absolute Monocytes: 494 cells/uL (ref 200–950)
Basophils Absolute: 43 cells/uL (ref 0–200)
Basophils Relative: 0.7 %
Eosinophils Absolute: 171 cells/uL (ref 15–500)
Eosinophils Relative: 2.8 %
HCT: 33.4 % — ABNORMAL LOW (ref 35.0–45.0)
Hemoglobin: 11.2 g/dL — ABNORMAL LOW (ref 11.7–15.5)
Lymphs Abs: 2050 cells/uL (ref 850–3900)
MCH: 31.4 pg (ref 27.0–33.0)
MCHC: 33.5 g/dL (ref 32.0–36.0)
MCV: 93.6 fL (ref 80.0–100.0)
MPV: 9.6 fL (ref 7.5–12.5)
Monocytes Relative: 8.1 %
Neutro Abs: 3343 cells/uL (ref 1500–7800)
Neutrophils Relative %: 54.8 %
Platelets: 295 10*3/uL (ref 140–400)
RBC: 3.57 10*6/uL — ABNORMAL LOW (ref 3.80–5.10)
RDW: 11.1 % (ref 11.0–15.0)
Total Lymphocyte: 33.6 %
WBC: 6.1 10*3/uL (ref 3.8–10.8)

## 2021-05-25 LAB — VITAMIN B12: Vitamin B-12: >2000 pg/mL — ABNORMAL HIGH (ref 200–1100)

## 2021-06-23 ENCOUNTER — Ambulatory Visit: Payer: Medicare Other

## 2021-07-15 ENCOUNTER — Encounter: Payer: Self-pay | Admitting: Nurse Practitioner

## 2021-07-15 NOTE — Telephone Encounter (Signed)
Called and spoke with patient and scheduled an appointment with Jody [name] 08/02/2021 @ 9:00. Patient requested to get her Vitamin B12 injection at that same time.  ? ?Canceled appointment on 5/2 for Nurse Vitamin B12 and added to visit.  ?

## 2021-07-19 ENCOUNTER — Ambulatory Visit: Payer: Medicare Other | Admitting: Dermatology

## 2021-07-20 ENCOUNTER — Encounter: Payer: Self-pay | Admitting: Nurse Practitioner

## 2021-07-23 ENCOUNTER — Ambulatory Visit: Payer: Medicare Other

## 2021-07-27 ENCOUNTER — Ambulatory Visit: Payer: Medicare Other

## 2021-08-02 ENCOUNTER — Ambulatory Visit: Payer: Medicare Other | Admitting: Nurse Practitioner

## 2021-08-02 ENCOUNTER — Ambulatory Visit (INDEPENDENT_AMBULATORY_CARE_PROVIDER_SITE_OTHER): Payer: Medicare Other | Admitting: Dermatology

## 2021-08-02 ENCOUNTER — Encounter: Payer: Self-pay | Admitting: Dermatology

## 2021-08-02 DIAGNOSIS — D485 Neoplasm of uncertain behavior of skin: Secondary | ICD-10-CM

## 2021-08-02 DIAGNOSIS — L570 Actinic keratosis: Secondary | ICD-10-CM

## 2021-08-02 DIAGNOSIS — Z85820 Personal history of malignant melanoma of skin: Secondary | ICD-10-CM

## 2021-08-02 DIAGNOSIS — Z1283 Encounter for screening for malignant neoplasm of skin: Secondary | ICD-10-CM

## 2021-08-02 NOTE — Patient Instructions (Signed)

## 2021-08-06 ENCOUNTER — Ambulatory Visit: Payer: Medicare Other | Admitting: Nurse Practitioner

## 2021-08-06 ENCOUNTER — Ambulatory Visit: Payer: Medicare Other | Admitting: Orthopaedic Surgery

## 2021-08-15 ENCOUNTER — Encounter: Payer: Self-pay | Admitting: Dermatology

## 2021-08-15 NOTE — Progress Notes (Signed)
   Follow-Up Visit   Subjective  Jody [name] is a 81 y.o. female who presents for the following: Annual Exam (Personal history of bcc scc and mm, yearly skin check ).  Annual skin check, growing spot on nose Location:  Duration:  Quality:  Associated Signs/Symptoms: Modifying Factors:  Severity:  Timing: Context:   Objective  Well appearing patient in no apparent distress; mood and affect are within normal limits. Sun exposed areas and back examined: No atypical pigmented lesions.  1 possible new nonmelanoma skin cancer nose will be biopsied.  Right post shoulder White scar- clear  Dorsum of Nose 8 mm waxy pink crust with retraction         All sun exposed areas plus back examined.   Assessment & Plan    Screening exam for skin cancer  Annual skin examination  Personal history of malignant melanoma of skin Right post shoulder  Check as needed change  Neoplasm of uncertain behavior of skin Dorsum of Nose  Skin / nail biopsy Type of biopsy: tangential   Informed consent: discussed and consent obtained   Timeout: patient name, date of birth, surgical site, and procedure verified   Procedure prep:  Patient was prepped and draped in usual sterile fashion (Non sterile) Prep type:  Chlorhexidine Anesthesia: the lesion was anesthetized in a standard fashion   Anesthetic:  1% lidocaine w/ epinephrine 1-100,000 local infiltration Instrument used: flexible razor blade   Outcome: patient tolerated procedure well   Post-procedure details: wound care instructions given    Specimen 1 - Surgical pathology Differential Diagnosis: bcc vs scc  Check Margins: No      I, Lavonna Monarch, MD, have reviewed all documentation for this visit.  The documentation on 08/15/21 for the exam, diagnosis, procedures, and orders are all accurate and complete.

## 2021-08-16 ENCOUNTER — Ambulatory Visit (INDEPENDENT_AMBULATORY_CARE_PROVIDER_SITE_OTHER): Payer: Medicare Other | Admitting: Nurse Practitioner

## 2021-08-16 ENCOUNTER — Encounter: Payer: Self-pay | Admitting: Nurse Practitioner

## 2021-08-16 VITALS — BP 144/80 | HR 75 | Temp 97.2°F | Ht 63.0 in | Wt 118.0 lb

## 2021-08-16 DIAGNOSIS — I10 Essential (primary) hypertension: Secondary | ICD-10-CM | POA: Diagnosis not present

## 2021-08-16 DIAGNOSIS — E538 Deficiency of other specified B group vitamins: Secondary | ICD-10-CM | POA: Diagnosis not present

## 2021-08-16 DIAGNOSIS — M159 Polyosteoarthritis, unspecified: Secondary | ICD-10-CM

## 2021-08-16 DIAGNOSIS — R1310 Dysphagia, unspecified: Secondary | ICD-10-CM | POA: Diagnosis not present

## 2021-08-16 MED ORDER — LISINOPRIL 5 MG PO TABS: 5.0000 mg | ORAL_TABLET | Freq: Every day | ORAL | 1 refills | Status: DC

## 2021-08-16 MED ORDER — CELECOXIB 100 MG PO CAPS: 100.0000 mg | ORAL_CAPSULE | Freq: Two times a day (BID) | ORAL | 2 refills | Status: DC | PRN

## 2021-08-16 MED ORDER — CYANOCOBALAMIN 1000 MCG/ML IJ SOLN
1000.0000 ug | Freq: Once | INTRAMUSCULAR | Status: AC
  Administered 2021-08-16: 1000 ug via INTRAMUSCULAR

## 2021-08-16 NOTE — Progress Notes (Signed)
Careteam: Patient Care Team: Lauree Chandler, NP as PCP - General (Geriatric Medicine) Marica Otter, [place] as Consulting Physician (Optometry) Regal, Tamala Fothergill, DPM as Consulting Physician (Podiatry) Debara Pickett, Nadean Corwin, MD as Consulting Physician (Cardiology) Lavonna Monarch, MD as Consulting Physician (Dermatology)  PLACE OF SERVICE:  [place] Directive information Does Patient Have a Medical Advance Directive?: Yes, Type of Advance Directive: [place];Living will, Does patient want to make changes to medical advance directive?: No - Patient declined  Allergies  Allergen Reactions   Shrimp [Shellfish Allergy] Anaphylaxis    All seafood   Prolia [Denosumab] Other (See Comments)    Chest pain   Percocet [Oxycodone-Acetaminophen]     Confusion     Chief Complaint  Patient presents with   Acute Visit    Discuss referral to ENT and b12 injection. Discuss lisinopril dosing, patient states it use to say take additional tablet if B/P elevated. Discuss how celebrex is written.      HPI: Patient is a 81 y.o. female for follow up.  Got b12 injection today.   Reports she is taking celebrex once daily- [place] she [place] found a pharmacy that will give her the medication per 90 day supply. She request the prescription to read twice daily so she can get more for the same cost.  Reports if she does not take her celebrex then her whole body hurts.   She takes blood pressure twice daily and if elevated she will take additional dose.   Osteoporosis- ON relast, takes in November.   She has had trouble for a while when she eats that there is a blockage.  Painful to swallow.  She has to lay on her left side for 30 mins and then this go away. When she was at her sisters she had to lay down  She reports only happens when she eats a full meal.  Over the last 6 months has gotten worse.  Her sister recommended she see ENT  because pain will go up into her ear. Does not want to take medication until she sees a specialist.  Her weight has been stable over the last 3 months.   Review of Systems:  Review of Systems  Constitutional:  Negative for chills, fever and weight loss.  HENT:  Positive for sore throat. Negative for tinnitus.        Pain with swallowing after she eats a meal  Respiratory:  Negative for cough, sputum production and shortness of breath.   Cardiovascular:  Negative for chest pain, palpitations and leg swelling.  Gastrointestinal:  Negative for abdominal pain, constipation, diarrhea and heartburn.  Genitourinary:  Negative for dysuria, frequency and urgency.  Musculoskeletal:  Positive for neck pain. Negative for back pain, falls, joint pain and myalgias.  Skin: Negative.   Neurological:  Negative for dizziness and headaches.  Psychiatric/Behavioral:  Negative for depression and memory loss. The patient does not have insomnia.    Past Medical History:  Diagnosis Date   Anemia, unspecified    B12 deficiency due to diet    Basal cell carcinoma    LEFT UPPER ARM SUP. TX=CX3 5FU   Cramp of limb    Edema of both legs    Encounter for long-term (current) use of other medications    Lumbago    Melanoma ([place]) 10/18/2010   RIGHT POST SHOULDER =MOHS   Obesity    Osteoarthrosis, unspecified whether  generalized or localized, unspecified site    Restless legs syndrome (RLS)    SCC (squamous cell carcinoma) 07/03/2017   RIGHT CHEST CX3 5FU   SCC (squamous cell carcinoma) 01/28/2019   LEFT FOREARM CX3 5FU   SCC (squamous cell carcinoma) [id number]   RIGHT FOREHEAD CX3 5FU   SCC (squamous cell carcinoma) 07/03/2017   RIGHT FOREARM CX3 5FU   SCCA (squamous cell carcinoma) of skin 04/08/2020   Right Anterior Mandible (in situ)(CX35FU)   SCCA (squamous cell carcinoma) of skin 04/08/2020   Left Supraorbital Region (Keratoacanthoma)   Senile osteoporosis    Squamous cell carcinoma of skin  01/30/2017   RIGHT JAWLINE TX WITH BX   Past Surgical History:  Procedure Laterality Date   CATARACT EXTRACTION W/ INTRAOCULAR LENS  IMPLANT, BILATERAL  04/2014   EYE SURGERY Right 04/28/2016   Macular   SPINE SURGERY  2005   STOMACH SURGERY  1981   stapleing    VITRECTOMY Right    with membrane peeling for a macular pucker   Social History:   reports that she quit smoking about 47 years ago. Her smoking use included cigarettes. She has never used smokeless tobacco. She reports that she does not drink alcohol and does not use drugs.  Family History  Problem Relation Age of Onset   Cancer Sister    Cancer Brother     Medications: Patient's Medications  New Prescriptions   No medications on file  Previous Medications   CALCIUM CARBONATE-VIT D-MIN (CALCIUM 1200 PO)    Take 1 tablet by mouth daily.   CELECOXIB (CELEBREX) 100 MG CAPSULE    Take 1 capsule (100 mg total) by mouth daily.   CHOLECALCIFEROL (VITAMIN D) 2000 UNITS TABLET    Take 2,000 Units by mouth daily.   CYANOCOBALAMIN (B-12 COMPLIANCE INJECTION IJ)    Inject 1 mL as directed as directed. Every 60 days as of 05/24/2021   DIPHENHYDRAMINE-ACETAMINOPHEN (TYLENOL PM) 25-500 MG TABS TABLET    Take 1 tablet by mouth at bedtime as needed.   GABAPENTIN (NEURONTIN) 300 MG CAPSULE    Take 300 mg by mouth 3 (three) times daily.   IRON 66 MG TABS    Take 1 tablet by mouth daily.    LISINOPRIL (ZESTRIL) 5 MG TABLET    Take 5 mg by mouth daily.   MULTIPLE VITAMIN (MULTIVITAMIN) TABLET    Take 1 tablet by mouth daily.   ZOLEDRONIC ACID (RECLAST IV)    as directed. Once yearly, last infusion was November 2022  Modified Medications   No medications on file  Discontinued Medications   No medications on file    Physical Exam:  Vitals:   08/16/21 0847  BP: (!) 144/80  Pulse: 75  Temp: (!) 97.2 F (36.2 C)  TempSrc: Temporal  SpO2: 99%  Weight: 118 lb (53.5 kg)  Height: '5\' 3"'$  (1.6 m)   Body mass index is 20.9 kg/m. Wt  Readings from Last 3 Encounters:  08/16/21 118 lb (53.5 kg)  05/24/21 116 lb 12.8 oz (53 kg)  02/23/21 115 lb 8 oz (52.4 kg)    Physical Exam Constitutional:      General: She is not in acute distress.    Appearance: She is well-developed. She is not diaphoretic.  HENT:     Head: Normocephalic and atraumatic.     Nose: Nose normal. No congestion or rhinorrhea.     Mouth/Throat:     Mouth: Mucous membranes are moist.  Pharynx: No oropharyngeal exudate.  Eyes:     Conjunctiva/sclera: Conjunctivae normal.     Pupils: Pupils are equal, round, and reactive to light.  Neck:     Comments: Fullness noted to right side of trachea, no mass palpated.  Cardiovascular:     Rate and Rhythm: Normal rate and regular rhythm.     Heart sounds: Normal heart sounds.  Pulmonary:     Effort: Pulmonary effort is normal.     Breath sounds: Normal breath sounds.  Abdominal:     General: Bowel sounds are normal.     Palpations: Abdomen is soft.  Musculoskeletal:     Cervical back: Normal range of motion and neck supple.     Right lower leg: No edema.     Left lower leg: No edema.  Lymphadenopathy:     Cervical: No cervical adenopathy.  Skin:    General: Skin is warm and dry.  Neurological:     Mental Status: She is alert.  Psychiatric:        Mood and Affect: Mood normal.    Labs reviewed: Basic Metabolic Panel: Recent Labs    10/28/20 1049 01/29/21 1021 05/24/21 1453  NA 136 137 133*  K 4.7 4.3 4.6  CL 103 102 97*  CO2 '24 24 26  '$ GLUCOSE 73 80 101  BUN 35* 26* 19  CREATININE 1.10* 1.06* 0.93  CALCIUM 9.4 9.7 10.2   Liver Function Tests: Recent Labs    10/28/20 1049 05/24/21 1453  AST 34 45*  ALT 20 23  BILITOT 0.5 0.5  PROT 6.8 7.0   No results for input(s): LIPASE, AMYLASE in the last 8760 hours. No results for input(s): AMMONIA in the last 8760 hours. CBC: Recent Labs    10/28/20 1049 05/24/21 1453  WBC 5.6 6.1  NEUTROABS 2,761 3,343  HGB 10.9* 11.2*  HCT  33.8* 33.4*  MCV 95.8 93.6  PLT 250 295   Lipid Panel: Recent Labs    10/28/20 1049  CHOL 149  HDL 63  LDLCALC 67  TRIG 107  CHOLHDL 2.4   TSH: No results for input(s): TSH in the last 8760 hours. A1C: Lab Results  Component Value Date   HGBA1C 5.5 11/14/2019     Assessment/Plan 1. B12 deficiency due to diet - cyanocobalamin ((VITAMIN B-12)) injection 1,000 mcg  2. Primary osteoarthritis involving multiple joints - celecoxib (CELEBREX) 100 MG capsule; Take 1 capsule (100 mg total) by mouth 2 (two) times daily as needed.  Dispense: 180 capsule; Refill: 2  3. Essential hypertension -controlled. Would like prescription to reflect that she can take additional tablet if needed - lisinopril (ZESTRIL) 5 MG tablet; Take 1 tablet (5 mg total) by mouth daily. Can take additional dose if bp <140/90  Dispense: 180 tablet; Refill: 1  4. Pain with swallowing -reports after eating there is a painful sensation in her neck/throat that will travel up to her ear.  Exam unremarkable. Weight stable. She is still able to swallow but painful after she eats.  Does not want to take any medication until evaluated further.  - Ambulatory referral to ENT - US Soft Tissue Head/Neck (NON-THYROID); Future  Oriana Horiuchi K. [place], [facility] [phone]

## 2021-08-17 ENCOUNTER — Telehealth: Payer: Self-pay | Admitting: *Deleted

## 2021-08-17 NOTE — Telephone Encounter (Signed)
Yes, she feels like there is something in her neck effecting swallowing.

## 2021-08-17 NOTE — Telephone Encounter (Signed)
Jody [name] with [employer] and agreed.

## 2021-08-17 NOTE — Telephone Encounter (Signed)
Jody [name] with [facility] called and stated that they received the order for Ultrasound Soft Tissue Head and Neck with a Diagnosis of Pain with Swallowing. Not sure if this is the correct test for the Diagnosis.   They are wondering if your wanting to rule out mass in neck.   Please Advise.   Radiologist Line for Questions [phone]

## 2021-08-22 ENCOUNTER — Encounter: Payer: Self-pay | Admitting: Nurse Practitioner

## 2021-08-24 ENCOUNTER — Ambulatory Visit
Admission: RE | Admit: 2021-08-24 | Discharge: 2021-08-24 | Disposition: A | Discharge status: Home / Self Care | Payer: Medicare Other | Source: Ambulatory Visit | Attending: Nurse Practitioner | Admitting: Nurse Practitioner

## 2021-08-24 DIAGNOSIS — R1310 Dysphagia, unspecified: Secondary | ICD-10-CM

## 2021-08-31 ENCOUNTER — Other Ambulatory Visit: Payer: Self-pay | Admitting: Otolaryngology

## 2021-08-31 DIAGNOSIS — J029 Acute pharyngitis, unspecified: Secondary | ICD-10-CM

## 2021-09-07 ENCOUNTER — Ambulatory Visit
Admission: RE | Admit: 2021-09-07 | Discharge: 2021-09-07 | Disposition: A | Discharge status: Home / Self Care | Payer: Medicare Other | Source: Ambulatory Visit | Attending: Otolaryngology | Admitting: Otolaryngology

## 2021-09-07 DIAGNOSIS — J029 Acute pharyngitis, unspecified: Secondary | ICD-10-CM

## 2021-09-07 MED ORDER — IOPAMIDOL (ISOVUE-300) INJECTION 61%
80.0000 mL | Freq: Once | INTRAVENOUS | Status: AC | PRN
  Administered 2021-09-07: 80 mL via INTRAVENOUS

## 2021-09-16 ENCOUNTER — Other Ambulatory Visit: Payer: Self-pay | Admitting: Otolaryngology

## 2021-09-16 DIAGNOSIS — R0989 Other specified symptoms and signs involving the circulatory and respiratory systems: Secondary | ICD-10-CM

## 2021-09-23 DIAGNOSIS — M1712 Unilateral primary osteoarthritis, left knee: Secondary | ICD-10-CM | POA: Insufficient documentation

## 2021-09-23 NOTE — Progress Notes (Signed)
Office Visit Note   Patient: Jody [name]           Date of Birth: 12/31/1940           MRN: [id number] Visit Date: 09/24/2021              Requested by: Lauree Chandler, NP [place],  [place] [address] PCP: Lauree Chandler, NP   Assessment & Plan: Visit Diagnoses:  1. Primary osteoarthritis of left knee   2. Primary osteoarthritis of right knee     Plan: Impression is bilateral knee DJD.  Cortisone injections repeated today.  Patient tolerated this well.  Follow-up as needed.  Follow-Up Instructions: No follow-ups on file.   Orders:  Orders Placed This Encounter  Procedures   Large Joint Inj: bilateral knee   XR KNEE 3 VIEW RIGHT   No orders of the defined types were placed in this encounter.     Procedures: Large Joint Inj: bilateral knee on 09/24/2021 9:37 AM Indications: pain Details: 22 G needle  Arthrogram: No  Medications (Right): 2 mL lidocaine 1 %; 2 mL bupivacaine 0.5 %; 40 mg methylPREDNISolone acetate 40 MG/ML Medications (Left): 2 mL lidocaine 1 %; 2 mL bupivacaine 0.5 %; 40 mg methylPREDNISolone acetate 40 MG/ML Outcome: tolerated well, no immediate complications Patient was prepped and draped in the usual sterile fashion.       Clinical Data: No additional findings.   Subjective: Chief Complaint  Patient presents with   Right Knee - Pain    HPI Moderate returns today bilateral knee pain.  She has known advanced DJD.  Good relief from prior cortisone injection and requesting repeating these today for both knees.  Review of Systems  Constitutional: Negative.   HENT: Negative.    Eyes: Negative.   Respiratory: Negative.    Cardiovascular: Negative.   Endocrine: Negative.   Musculoskeletal: Negative.   Neurological: Negative.   Hematological: Negative.   Psychiatric/Behavioral: Negative.    All other systems reviewed and are negative.    Objective: Vital Signs: There were no vitals taken for this  visit.  Physical Exam Vitals and nursing note reviewed.  Constitutional:      Appearance: She is well-developed.  Pulmonary:     Effort: Pulmonary effort is normal.  Skin:    General: Skin is warm.     Capillary Refill: Capillary refill takes less than 2 seconds.  Neurological:     Mental Status: She is alert and oriented to person, place, and time.  Psychiatric:        Behavior: Behavior normal.        Thought Content: Thought content normal.        Judgment: Judgment normal.    Ortho Exam Examination of both knees is unchanged. Specialty Comments:  No specialty comments available.  Imaging: No results found.   PMFS History: Patient Active Problem List   Diagnosis Date Noted   Primary osteoarthritis of left knee 09/23/2021   Pure hypercholesterolemia 11/14/2019   Hyperglycemia 11/14/2019   Decreased visual acuity 11/14/2019   Other fatigue 11/14/2019   Positional lightheadedness 11/14/2019   Gastroesophageal reflux disease 02/04/2019   BMI 32.0-32.9,adult 02/12/2018   Obesity (BMI 30.0-34.9) 07/17/2017   Impingement syndrome of right shoulder 07/04/2016   Chronic pain of both knees 07/04/2016   Lumbar back pain with radiculopathy affecting left lower extremity 04/11/2016   Displaced transverse fracture of left patella, subsequent encounter for closed fracture with delayed healing 02/03/2016  Finger fracture, left 02/03/2016   Leg cramps, sleep related 11/19/2015   Cervical spondylosis without myelopathy 06/08/2015   Osteoarthritis of finger of right hand 06/08/2015   Absolute anemia 05/16/2014   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 TX=CX3 5FU    Cramp of limb    Edema of both legs    Encounter for long-term (current) use of other medications    Lumbago    Melanoma ([place]) 10/18/2010   RIGHT POST SHOULDER =MOHS   Obesity    Osteoarthrosis, unspecified whether generalized or localized, unspecified site    Restless legs syndrome (RLS)    SCC (squamous cell carcinoma) 07/03/2017   RIGHT CHEST CX3 5FU   SCC (squamous cell carcinoma) 01/28/2019   LEFT FOREARM CX3 5FU   SCC (squamous cell carcinoma) [id number]   RIGHT FOREHEAD CX3 5FU   SCC (squamous cell carcinoma) 07/03/2017   RIGHT FOREARM CX3 5FU   SCCA (squamous cell carcinoma) of skin 04/08/2020   Right Anterior Mandible (in situ)(CX35FU)   SCCA (squamous cell carcinoma) of skin 04/08/2020   Left Supraorbital Region (Keratoacanthoma)   Senile osteoporosis    Squamous cell carcinoma of skin 01/30/2017   RIGHT JAWLINE TX WITH BX    Family History  Problem Relation Age of Onset   Cancer Sister    Cancer Brother     Past Surgical History:  Procedure Laterality Date   CATARACT EXTRACTION W/ INTRAOCULAR LENS  IMPLANT, BILATERAL  04/2014   EYE SURGERY Right 04/28/2016   Macular   SPINE SURGERY  2005   STOMACH SURGERY  1981   stapleing    VITRECTOMY Right    with membrane peeling for a macular pucker   Social History   Occupational History   Occupation: Retired Manufacturing engineer: [employer]  Tobacco Use   Smoking status: Former    Types: Cigarettes    Quit date: 05/21/1974    Years since quitting: 47.3   Smokeless tobacco: Never   Tobacco comments:    Quit in early 30's  Vaping Use   Vaping Use: Never used  Substance and Sexual Activity   Alcohol use: No    Alcohol/week: 0.0 standard drinks of alcohol   Drug use: No   Sexual activity: Never

## 2021-09-24 ENCOUNTER — Ambulatory Visit (INDEPENDENT_AMBULATORY_CARE_PROVIDER_SITE_OTHER): Payer: Medicare Other | Admitting: Orthopaedic Surgery

## 2021-09-24 ENCOUNTER — Ambulatory Visit (INDEPENDENT_AMBULATORY_CARE_PROVIDER_SITE_OTHER): Payer: Medicare Other

## 2021-09-24 DIAGNOSIS — M170 Bilateral primary osteoarthritis of knee: Secondary | ICD-10-CM

## 2021-09-24 DIAGNOSIS — M1711 Unilateral primary osteoarthritis, right knee: Secondary | ICD-10-CM | POA: Diagnosis not present

## 2021-09-24 DIAGNOSIS — M1712 Unilateral primary osteoarthritis, left knee: Secondary | ICD-10-CM | POA: Diagnosis not present

## 2021-09-26 MED ORDER — METHYLPREDNISOLONE ACETATE 40 MG/ML IJ SUSP
40.0000 mg | INTRAMUSCULAR | Status: AC | PRN
  Administered 2021-09-24: 40 mg via INTRA_ARTICULAR

## 2021-09-26 MED ORDER — LIDOCAINE HCL 1 % IJ SOLN
2.0000 mL | INTRAMUSCULAR | Status: AC | PRN
  Administered 2021-09-24: 2 mL

## 2021-09-26 MED ORDER — BUPIVACAINE HCL 0.5 % IJ SOLN
2.0000 mL | INTRAMUSCULAR | Status: AC | PRN
  Administered 2021-09-24: 2 mL via INTRA_ARTICULAR

## 2021-09-29 ENCOUNTER — Ambulatory Visit
Admission: RE | Admit: 2021-09-29 | Discharge: 2021-09-29 | Disposition: A | Discharge status: Home / Self Care | Payer: Medicare Other | Source: Ambulatory Visit | Attending: Otolaryngology | Admitting: Otolaryngology

## 2021-09-29 DIAGNOSIS — R0989 Other specified symptoms and signs involving the circulatory and respiratory systems: Secondary | ICD-10-CM

## 2021-10-08 ENCOUNTER — Encounter: Payer: Self-pay | Admitting: Nurse Practitioner

## 2021-10-08 LAB — HM MAMMOGRAPHY

## 2021-10-11 ENCOUNTER — Encounter: Payer: Self-pay | Admitting: Nurse Practitioner

## 2021-10-25 ENCOUNTER — Encounter: Payer: Self-pay | Admitting: Dermatology

## 2021-10-26 ENCOUNTER — Encounter: Payer: Self-pay | Admitting: Dermatology

## 2021-11-05 ENCOUNTER — Ambulatory Visit (INDEPENDENT_AMBULATORY_CARE_PROVIDER_SITE_OTHER): Payer: Medicare Other | Admitting: Nurse Practitioner

## 2021-11-05 ENCOUNTER — Other Ambulatory Visit (INDEPENDENT_AMBULATORY_CARE_PROVIDER_SITE_OTHER): Payer: Medicare Other

## 2021-11-05 ENCOUNTER — Encounter: Payer: Self-pay | Admitting: Nurse Practitioner

## 2021-11-05 VITALS — BP 92/60 | HR 64 | Ht 60.0 in | Wt 116.4 lb

## 2021-11-05 DIAGNOSIS — R1310 Dysphagia, unspecified: Secondary | ICD-10-CM | POA: Diagnosis not present

## 2021-11-05 DIAGNOSIS — D649 Anemia, unspecified: Secondary | ICD-10-CM | POA: Diagnosis not present

## 2021-11-05 DIAGNOSIS — E538 Deficiency of other specified B group vitamins: Secondary | ICD-10-CM

## 2021-11-05 LAB — CBC
HCT: 32.5 % — ABNORMAL LOW (ref 36.0–46.0)
Hemoglobin: 11 g/dL — ABNORMAL LOW (ref 12.0–15.0)
MCHC: 33.8 g/dL (ref 30.0–36.0)
MCV: 94.3 fl (ref 78.0–100.0)
Platelets: 292 10*3/uL (ref 150.0–400.0)
RBC: 3.44 Mil/uL — ABNORMAL LOW (ref 3.87–5.11)
RDW: 13.2 % (ref 11.5–15.5)
WBC: 6.2 10*3/uL (ref 4.0–10.5)

## 2021-11-05 LAB — COMPREHENSIVE METABOLIC PANEL
ALT: 21 U/L (ref 0–35)
AST: 33 U/L (ref 0–37)
Albumin: 4.5 g/dL (ref 3.5–5.2)
Alkaline Phosphatase: 41 U/L (ref 39–117)
BUN: 25 mg/dL — ABNORMAL HIGH (ref 6–23)
CO2: 28 mEq/L (ref 19–32)
Calcium: 9.7 mg/dL (ref 8.4–10.5)
Chloride: 95 mEq/L — ABNORMAL LOW (ref 96–112)
Creatinine, Ser: 0.99 mg/dL (ref 0.40–1.20)
GFR: 52.74 mL/min — ABNORMAL LOW (ref >60.00)
Glucose, Bld: 91 mg/dL (ref 70–99)
Potassium: 4.2 mEq/L (ref 3.5–5.1)
Sodium: 131 mEq/L — ABNORMAL LOW (ref 135–145)
Total Bilirubin: 0.5 mg/dL (ref 0.2–1.2)
Total Protein: 7.3 g/dL (ref 6.0–8.3)

## 2021-11-05 LAB — IBC + FERRITIN
Ferritin: 174.1 ng/mL (ref 10.0–291.0)
Iron: 62 ug/dL (ref 42–145)
Saturation Ratios: 14.9 % — ABNORMAL LOW (ref 20.0–50.0)
TIBC: 417.2 ug/dL (ref 250.0–450.0)
Transferrin: 298 mg/dL (ref 212.0–360.0)

## 2021-11-05 LAB — B12 AND FOLATE PANEL
Folate: >24.2 ng/mL (ref >5.9)
Vitamin B-12: 612 pg/mL (ref 211–911)

## 2021-11-05 NOTE — Progress Notes (Signed)
11/05/2021 [name] [id number] Dec 14, 1940   CHIEF COMPLAINT: Pain with swallowing  HISTORY OF PRESENT ILLNESS: Jody [name] is an 81 year old [name] with a past medical history of hypertension, restless leg syndrome, B12 deficiency, anemia, malignant melanoma (right shoulder), squamous cell skin cancer and diverticulosis.  S/P gastric stapling in [place] in 1981.  She presents to our office today as referred by ENT Dr. Wilburn Cornelia for further evaluation regarding odynophagia.  She developed pain to the right side of her throat Nov or Dec 2022.  Her throat soreness persisted and was worse when eating solid foods.  No dysphagia.  No classic heartburn symptoms.  She also noted a globus sensation and voice hoarseness.  She was seen by her PCP without evidence of pharyngitis.  She underwent a thyroid ultrasound 08/24/2021 which showed a small left thyroid cyst unlikely the cause of her odynophagia.  She was referred to ENT Dr. Wilburn Cornelia and she underwent laryngoscopy 08/30/2021 showed a normal larynx, vocal cord mobility without ulcer, mass or tumor.  Post glottic erythema was noted, possibly secondary to reflux.  Dr. Wilburn Cornelia prescribed PPI therapy which she did not wish to take.  She underwent a soft tissue/neck CT with contrast 09/07/2021 which showed marked gaseous distention of the esophagus from the esophageal verge into the upper chest without evidence of adenopathy or mass in the neck and severe cervical spine degeneration was noted.  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   She continues to have pain to the right throat area when swallowing.  Food does not get stuck in the throat or  esophagus.  No heartburn.  No nausea or vomiting.  No upper or lower abdominal pain.  She takes Celebrex 100 mg as needed for arthritis pain.  She has chronic anemia.  She takes iron 66 mg p.o. daily since she had her gastric stapling surgery.  She has vitamin B12 deficiency on B12 injections every 60 days.  No history of GI bleeding.  She denies ever having an EGD.  Her bowel pattern was variable over the past year which included frequent loose stools at times with episodes of fecal incontinence.  She tried taking Benefiber which worsen the symptoms so she stopped taking it.  Over the past few months, her bowel pattern has regulated and she is passing a normal formed brown bowel movement most days without further episodes of incontinence/fecal leakage.  No rectal bleeding.  Her stools are sometimes black which she attributes to taking iron.  She had a positive Cologuard test in 2015 and then subsequently underwent a colonoscopy by [facility] in 2016 which showed diverticulosis, no polyps.  Her initial screening colonoscopy was around 2006 which she reported was normal.  Prior to her gastric stapling surgery she weighed 270 pounds.  She reported weighing 170 pounds in 2020.  She intentionally lost weight over the past few years with her goal weight of 115 pounds.  Today's weight is 116 pounds.  She drinks a protein shake and peanut butter on toast with fruit in the morning.  She eats rotisserie chicken and vegetable for her main meal daily around 2 PM.  She no longer cooks her meals.  She lives with her husband.  No history of cardiac or pulmonary disease.       Latest Ref Rng & Units 05/24/2021    2:53 PM 10/28/2020   10:49 AM 11/14/2019    9:37 AM  CBC  WBC 3.8 - 10.8 Thousand/uL 6.1  5.6  5.8   Hemoglobin 11.7 - 15.5 g/dL 11.2  10.9  11.9   Hematocrit 35.0 - 45.0 % 33.4  33.8  36.0   Platelets 140 - 400 Thousand/uL 295  250  312        Latest Ref Rng & Units 05/24/2021    2:53 PM 01/29/2021   10:21 AM  10/28/2020   10:49 AM  CMP  Glucose Jody - 139 mg/dL 101  80  73   BUN 7 - 25 mg/dL 19  26  35   Creatinine 0.60 - 0.95 mg/dL 0.93  1.06  1.10   Sodium 135 - 146 mmol/L 133  137  136   Potassium 3.5 - 5.3 mmol/L 4.6  4.3  4.7   Chloride 98 - 110 mmol/L 97  102  103   CO2 20 - 32 mmol/L '26  24  24   '$ Calcium 8.6 - 10.4 mg/dL 10.2  9.7  9.4   Total Protein 6.1 - 8.1 g/dL 7.0   6.8   Total Bilirubin 0.2 - 1.2 mg/dL 0.5   0.5   AST 10 - 35 U/L 45   34   ALT 6 - 29 U/L 23   20     + Cologurad 2015  Colonoscopy 04/16/2014: Diverticulosis descending and sigmoid colon  Past Medical History:  Diagnosis Date   Anemia, unspecified    B12 deficiency due to diet    Basal cell carcinoma    LEFT UPPER ARM SUP. TX=CX3 5FU   Cramp of limb    Edema of both legs    Encounter for long-term (current) use of other medications    Lumbago    Melanoma ([place]) 10/18/2010   RIGHT POST SHOULDER =MOHS   Obesity    Osteoarthrosis, unspecified whether generalized or localized, unspecified site    Restless legs syndrome (RLS)    SCC (squamous cell carcinoma) 07/03/2017   RIGHT CHEST CX3 5FU   SCC (squamous cell carcinoma) 01/28/2019   LEFT FOREARM CX3 5FU   SCC (squamous cell carcinoma) [id number]   RIGHT FOREHEAD CX3 5FU   SCC (squamous cell carcinoma) 07/03/2017   RIGHT FOREARM CX3 5FU   SCCA (squamous cell carcinoma) of skin 04/08/2020   Right Anterior Mandible (in situ)(CX35FU)   SCCA (squamous cell carcinoma) of skin 04/08/2020   Left Supraorbital Region (Keratoacanthoma)   Senile osteoporosis    Squamous cell carcinoma of skin 01/30/2017   RIGHT JAWLINE TX WITH BX   Past Surgical History:  Procedure Laterality Date   CATARACT EXTRACTION W/ INTRAOCULAR LENS  IMPLANT, BILATERAL  04/2014   EYE SURGERY Right 04/28/2016   Macular   SPINE SURGERY  2005   STOMACH SURGERY  1981   stapleing    VITRECTOMY Right    with membrane peeling for a macular pucker   Social History: She is married.  Retired.   She has 1 daughter.  Stopped smoking cigarettes in 1976. No alcohol.  No drug use.  Family History: Brother had lung cancer. Sister possibly had colon cancer.  Allergies  Allergen Reactions   Shrimp [Shellfish Allergy] Anaphylaxis    All seafood   Prolia [Denosumab] Other (See Comments)    Chest pain   Percocet [Oxycodone-Acetaminophen]  Confusion      Outpatient Encounter Medications as of 11/05/2021  Medication Sig   Calcium Carbonate-Vit D-Min (CALCIUM 1200 PO) Take 1 tablet by mouth daily.   celecoxib (CELEBREX) 100 MG capsule Take 1 capsule (100 mg total) by mouth 2 (two) times daily as needed.   Cholecalciferol (VITAMIN D) 2000 UNITS tablet Take 2,000 Units by mouth daily.   Cyanocobalamin (B-12 COMPLIANCE INJECTION IJ) Inject 1 mL as directed as directed. Every 60 days as of 05/24/2021   diphenhydramine-acetaminophen (TYLENOL PM) 25-500 MG TABS tablet Take 1 tablet by mouth at bedtime as needed.   gabapentin (NEURONTIN) 300 MG capsule Take 300 mg by mouth 3 (three) times daily.   Iron 66 MG TABS Take 1 tablet by mouth daily.    lisinopril (ZESTRIL) 5 MG tablet Take 1 tablet (5 mg total) by mouth daily. Can take additional dose if bp <140/90   Multiple Vitamin (MULTIVITAMIN) tablet Take 1 tablet by mouth daily.   Zoledronic Acid (RECLAST IV) as directed. Once yearly, last infusion was November 2022   No facility-administered encounter medications on file as of 11/05/2021.   REVIEW OF SYSTEMS: . Gen: Denies fever, sweats or chills. No recent weight loss.  CV: Denies chest pain, palpitations or edema. Resp: Denies cough, shortness of breath of hemoptysis.  GI: See HPI. GU : Denies urinary burning, blood in urine, increased urinary frequency or incontinence. MS: Left knee pain s/p steroid injection 09/24/2021. Derm: Denies rash, itchiness, skin lesions or unhealing ulcers. Psych: Denies depression, anxiety or memory loss. Heme: Denies bruising, easy bleeding. Neuro:  Denies  headaches, dizziness or paresthesias. Endo:  Denies any problems with DM, thyroid or adrenal function.  PHYSICAL EXAM: BP 92/60   Pulse 64   Ht 5' (1.524 m)   Wt 116 lb 6 oz (52.8 kg)   BMI 22.73 kg/m   Wt Readings from Last 3 Encounters:  11/05/21 116 lb 6 oz (52.8 kg)  08/16/21 118 lb (53.5 kg)  05/24/21 116 lb 12.8 oz (53 kg)    General: Jody 81 year old [name] in no acute distress. Head: Normocephalic and atraumatic. Eyes:  Sclerae non-icteric, conjunctive pink. Ears: Normal auditory acuity. Mouth: Dentition intact. No ulcers or lesions.  Neck: Supple, no lymphadenopathy or thyromegaly.  Lungs: Clear bilaterally to auscultation without wheezes, crackles or rhonchi. Heart: Regular rate and rhythm. No murmur, rub or gallop appreciated.  Abdomen: Soft, nontender, non distended. No masses. No hepatosplenomegaly. Normoactive bowel sounds x 4 quadrants.  Rectal: Deferred. Musculoskeletal: Symmetrical with no gross deformities. Skin: Thickened facial derm folds.  Warm and dry. No rash or lesions on visible extremities. Extremities: No edema. Neurological: Alert oriented x 4, no focal deficits.  Psychological:  Alert and cooperative. Normal mood and affect.  ASSESSMENT AND PLAN:  44) 81 year old [name] s/p gastric stapling surgery 1981 with right-sided odynophagia with intermittent globus sensation x 8 months.  Laryngoscopy showed post glottic erythema suggestive of acid reflux.  PPI recommended, patient declined. Soft tissue/neck CT 09/07/2021 showed marked gaseous distention of the esophagus from the esophageal verge into the upper chest without evidence of adenopathy or mass in the neck. 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  -Famotidine 20 mg once daily OTC recommended, patient does not wish  to initiate at this time -EGD to rule out GERD, esophageal dysmotility/esophageal scleroderma benefits and risks discussed including risk with sedation, risk of bleeding, perforation and infection.  Patient was offered EGD with Dr. Loletha Carrow on Thursday 11/11/2021 which she declined.  She is planning a beach vacation with her family 8/25 until the end of September for she defers any endoscopic evaluation for now.  She will contact her office when she is ready to schedule an upper endoscopy.  She was advised to go to the local emergency room if she develops severe odynophagia or significant weight loss. -Consider esophageal manometry if EGD unrevealing -Limit Celebrex use  2) Chronic anemia, on oral iron and B12 injections.  No overt GI bleeding, stools are sometimes black after taking iron which has occurred since her gastric stapling surgery in 1981. -CBC, IBC + ferritin and B12 level   3) Mildly elevated AST level  -CMP  4) Colon cancer screening.  Colonoscopy 03/2014 showed diverticulosis to the descending and sigmoid colon -No further screening colonoscopies due to age  16) History of IBS symptoms, variable bowel pattern with fecal incontinence, stable for the past few months    CC:  Lauree Chandler, NP

## 2021-11-05 NOTE — Patient Instructions (Addendum)
Your provider has requested that you go to the basement level for lab work before leaving today. Press "B" on the elevator. The lab is located at the first door on the left as you exit the elevator.  May take Famotidine 20 mg 1 tablet daily as needed.  It has been recommended to you by your physician that you have a(n) endoscopy completed. Per your request, we did not schedule the procedure(s) today. Please contact our office at [phone] should you decide to have the procedure completed. You will be scheduled for a pre-visit and procedure at that time.  _______________________________________________________  If you are age 88 or older, your body mass index should be between 23-30. Your Body mass index is 22.73 kg/m. If this is out of the aforementioned range listed, please consider follow up with your Primary Care Provider.  If you are age 64 or younger, your body mass index should be between 19-25. Your Body mass index is 22.73 kg/m. If this is out of the aformentioned range listed, please consider follow up with your Primary Care Provider.   ________________________________________________________  The [facility] providers would like to encourage you to use [facility] to communicate with providers for non-urgent requests or questions.  Due to long hold times on the telephone, sending your provider a message by [facility] may be a faster and more efficient way to get a response.  Please allow 48 business hours for a response.  Please remember that this is for non-urgent requests.  _______________________________________________________

## 2021-11-05 NOTE — Progress Notes (Signed)
____________________________________________________________  Attending physician addendum:  Thank you for sending this case to me. I have reviewed the entire note and agree with the plan.  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   Wilfrid Lund, MD  ____________________________________________________________

## 2021-11-15 ENCOUNTER — Ambulatory Visit (INDEPENDENT_AMBULATORY_CARE_PROVIDER_SITE_OTHER): Payer: Medicare Other | Admitting: Nurse Practitioner

## 2021-11-15 ENCOUNTER — Encounter: Payer: Self-pay | Admitting: Nurse Practitioner

## 2021-11-15 VITALS — BP 110/64 | HR 71 | Temp 97.1°F | Ht 60.5 in | Wt 115.0 lb

## 2021-11-15 DIAGNOSIS — M810 Age-related osteoporosis without current pathological fracture: Secondary | ICD-10-CM

## 2021-11-15 DIAGNOSIS — I10 Essential (primary) hypertension: Secondary | ICD-10-CM | POA: Diagnosis not present

## 2021-11-15 DIAGNOSIS — R1310 Dysphagia, unspecified: Secondary | ICD-10-CM | POA: Diagnosis not present

## 2021-11-15 DIAGNOSIS — E538 Deficiency of other specified B group vitamins: Secondary | ICD-10-CM | POA: Diagnosis not present

## 2021-11-15 DIAGNOSIS — M159 Polyosteoarthritis, unspecified: Secondary | ICD-10-CM | POA: Diagnosis not present

## 2021-11-15 DIAGNOSIS — E871 Hypo-osmolality and hyponatremia: Secondary | ICD-10-CM

## 2021-11-15 DIAGNOSIS — G629 Polyneuropathy, unspecified: Secondary | ICD-10-CM

## 2021-11-15 NOTE — Patient Instructions (Signed)
Start vitamin b12 supplement 1000 mcg daily

## 2021-11-15 NOTE — Progress Notes (Signed)
Careteam: Patient Care Team: Lauree Chandler, NP as PCP - General (Geriatric Medicine) Marica Otter, [place] as Consulting Physician (Optometry) Regal, Tamala Fothergill, DPM as Consulting Physician (Podiatry) Debara Pickett, Nadean Corwin, MD as Consulting Physician (Cardiology) Lavonna Monarch, MD as Consulting Physician (Dermatology)  PLACE OF SERVICE:  [place] Directive information Does Patient Have a Medical Advance Directive?: Yes, Type of Advance Directive: [place];Living will, Does patient want to make changes to medical advance directive?: No - Patient declined  Allergies  Allergen Reactions   Shrimp [Shellfish Allergy] Anaphylaxis    All seafood   Prolia [Denosumab] Other (See Comments)    Chest pain   Percocet [Oxycodone-Acetaminophen]     Confusion     Chief Complaint  Patient presents with   Medical Management of Chronic Issues    6 month follow-up. Discuss need for additional covid boosters and flu vaccine or post pone if patient refuses. Discuss B12 injection.     HPI: Patient is a 81 y.o. female for routine follow up  Odynophagia- ongoing, ENT and GI both recommended PPI or acid reduction medication however she has declined PPI and declines pepcid. Declined EGD at this time, plans on doing it when she comes back from the beach.   Hypertension-  5 mg lisinopril daily, no hypotensive events.BP controlled on lisinopril   Osteoarthritis on celebrex- still working well. Takes once a day. Has been recommended to reduce use due to GERD  Typically does B12 injections bi-monthly but level WNL so inquired about other options.    Review of Systems:  Review of Systems  Constitutional:  Negative for chills, fever and weight loss.  HENT:  Positive for sore throat. Negative for congestion.   Eyes:  Negative for blurred vision and double vision.  Respiratory:  Negative for cough, sputum production and shortness of breath.   Cardiovascular:  Positive for  leg swelling. Negative for chest pain.  Gastrointestinal:  Negative for abdominal pain, blood in stool, constipation and diarrhea.  Genitourinary:  Negative for dysuria and hematuria.  Musculoskeletal:  Positive for back pain.  Skin:  Negative for itching and rash.  Neurological:  Positive for tingling. Negative for dizziness and headaches.  Psychiatric/Behavioral:  Negative for depression. The patient is not nervous/anxious.     Past Medical History:  Diagnosis Date   Anemia, unspecified    B12 deficiency due to diet    Basal cell carcinoma    LEFT UPPER ARM SUP. TX=CX3 5FU   Cramp of limb    Edema of both legs    Encounter for long-term (current) use of other medications    Lumbago    Melanoma ([place]) 10/18/2010   RIGHT POST SHOULDER =MOHS   Obesity    Osteoarthrosis, unspecified whether generalized or localized, unspecified site    Restless legs syndrome (RLS)    SCC (squamous cell carcinoma) 07/03/2017   RIGHT CHEST CX3 5FU   SCC (squamous cell carcinoma) 01/28/2019   LEFT FOREARM CX3 5FU   SCC (squamous cell carcinoma) [id number]   RIGHT FOREHEAD CX3 5FU   SCC (squamous cell carcinoma) 07/03/2017   RIGHT FOREARM CX3 5FU   SCCA (squamous cell carcinoma) of skin 04/08/2020   Right Anterior Mandible (in situ)(CX35FU)   SCCA (squamous cell carcinoma) of skin 04/08/2020   Left Supraorbital Region (Keratoacanthoma)   Senile osteoporosis    Squamous cell carcinoma of skin 01/30/2017   RIGHT JAWLINE TX WITH BX   Past Surgical History:  Procedure Laterality  Date   CATARACT EXTRACTION W/ INTRAOCULAR LENS  IMPLANT, BILATERAL  04/2014   COLONOSCOPY     EYE SURGERY Right 04/28/2016   Macular   SPINE SURGERY  2005   STOMACH SURGERY  1981   stapleing    VITRECTOMY Right    with membrane peeling for a macular pucker   Social History:   reports that she quit smoking about 47 years ago. Her smoking use included cigarettes. She has never used smokeless tobacco. She reports that  she does not drink alcohol and does not use drugs.  Family History  Problem Relation Age of Onset   Diabetes Father    Prostate cancer Father    Colon polyps Sister    Cancer Sister    Diabetes Brother    Prostate cancer Brother    Cancer Brother    Stomach cancer Maternal Grandmother    Esophageal cancer Neg Hx    Pancreatic cancer Neg Hx     Medications: Patient's Medications  New Prescriptions   No medications on file  Previous Medications   CALCIUM CARBONATE-VIT D-MIN (CALCIUM 1200 PO)    Take 1 tablet by mouth daily.   CELECOXIB (CELEBREX) 100 MG CAPSULE    Take 1 capsule (100 mg total) by mouth 2 (two) times daily as needed.   CHOLECALCIFEROL (VITAMIN D) 2000 UNITS TABLET    Take 2,000 Units by mouth daily.   CYANOCOBALAMIN (B-12 COMPLIANCE INJECTION IJ)    Inject 1 mL as directed as directed. Every 60 days as of 05/24/2021   DIPHENHYDRAMINE-ACETAMINOPHEN (TYLENOL PM) 25-500 MG TABS TABLET    Take 1 tablet by mouth at bedtime as needed.   GABAPENTIN (NEURONTIN) 300 MG CAPSULE    Take 300 mg by mouth 3 (three) times daily.   IRON 66 MG TABS    Take 1 tablet by mouth daily.    LISINOPRIL (ZESTRIL) 5 MG TABLET    Take 1 tablet (5 mg total) by mouth daily. Can take additional dose if bp <140/90   MULTIPLE VITAMIN (MULTIVITAMIN) TABLET    Take 1 tablet by mouth daily.   ZOLEDRONIC ACID (RECLAST IV)    as directed. Once yearly, last infusion was November 2022  Modified Medications   No medications on file  Discontinued Medications   No medications on file    Physical Exam:  Vitals:   11/15/21 1414  BP: 110/64  Pulse: 71  Temp: (!) 97.1 F (36.2 C)  TempSrc: Temporal  SpO2: 95%  Weight: 115 lb (52.2 kg)  Height: 5' 0.5" (1.537 m)   Body mass index is 22.09 kg/m. Wt Readings from Last 3 Encounters:  11/15/21 115 lb (52.2 kg)  11/05/21 116 lb 6 oz (52.8 kg)  08/16/21 118 lb (53.5 kg)    Physical Exam Constitutional:      General: She is not in acute  distress. Eyes:     Conjunctiva/sclera: Conjunctivae normal.     Pupils: Pupils are equal, round, and reactive to light.  Cardiovascular:     Rate and Rhythm: Normal rate and regular rhythm.     Pulses: Normal pulses.     Heart sounds: Normal heart sounds. No murmur heard. Pulmonary:     Effort: Pulmonary effort is normal. No respiratory distress.     Breath sounds: Normal breath sounds. No wheezing.  Abdominal:     General: Bowel sounds are normal. There is no distension.     Palpations: Abdomen is soft.     Tenderness: There is  no abdominal tenderness.  Musculoskeletal:     Right lower leg: No edema.     Left lower leg: No edema.  Skin:    General: Skin is warm.  Neurological:     Mental Status: She is alert and oriented to person, place, and time. Mental status is at baseline.     Gait: Gait normal.  Psychiatric:        Mood and Affect: Mood normal.        Behavior: Behavior normal.     Labs reviewed: Basic Metabolic Panel: Recent Labs    01/29/21 1021 05/24/21 1453 11/05/21 1027  NA 137 133* 131*  K 4.3 4.6 4.2  CL 102 97* 95*  CO2 '24 26 28  '$ GLUCOSE 80 101 91  BUN 26* 19 25*  CREATININE 1.06* 0.93 0.99  CALCIUM 9.7 10.2 9.7   Liver Function Tests: Recent Labs    05/24/21 1453 11/05/21 1027  AST 45* 33  ALT 23 21  ALKPHOS  --  41  BILITOT 0.5 0.5  PROT 7.0 7.3  ALBUMIN  --  4.5   No results for input(s): "LIPASE", "AMYLASE" in the last 8760 hours. No results for input(s): "AMMONIA" in the last 8760 hours. CBC: Recent Labs    05/24/21 1453 11/05/21 1027  WBC 6.1 6.2  NEUTROABS 3,343  --   HGB 11.2* 11.0*  HCT 33.4* 32.5*  MCV 93.6 94.3  PLT 295 292.0   Lipid Panel: No results for input(s): "CHOL", "HDL", "LDLCALC", "TRIG", "CHOLHDL", "LDLDIRECT" in the last 8760 hours. TSH: No results for input(s): "TSH" in the last 8760 hours. A1C: Lab Results  Component Value Date   HGBA1C 5.5 11/14/2019     Assessment/Plan 1. B12 deficiency due  to diet - recent level stable, will have her start t oral b12 OTC and recheck in 3 mos  2. Essential hypertension -Blood pressure well controlled, goal bp <140/90 Continue current medications and dietary modifications follow metabolic panel - continue lisinopril   3. Pain with swallowing - has deferred EGD and Pepcid at this time  - weight is stable.  -will continue to monitor.  4. Primary osteoarthritis involving multiple joints - continue celebrex, recommended decrease in use due to worsening GERD symptoms   5. Neuropathy - stable continue gabapentin  6. Senile osteoporosis - yearly reclast injections in November  7.Hyponatremia - recheck at this visit  -she does not drink excessive water, has sodium in diet.  -no medications that could be cause.    Return in about 3 months (around 02/15/2022) for Routine follow up and blood work .  Student- Waunita Schooner, RN -I personally was present during the history, physical exam and medical decision-making activities of this service and have verified that the service and findings are accurately documented in the student's note Shandrea Lusk K. [place], [facility] [phone]

## 2021-11-16 LAB — BASIC METABOLIC PANEL WITH GFR
BUN/Creatinine Ratio: 14 (calc) (ref 6–22)
BUN: 15 mg/dL (ref 7–25)
CO2: 25 mmol/L (ref 20–32)
Calcium: 10.4 mg/dL (ref 8.6–10.4)
Chloride: 97 mmol/L — ABNORMAL LOW (ref 98–110)
Creat: 1.07 mg/dL — ABNORMAL HIGH (ref 0.60–0.95)
Glucose, Bld: 109 mg/dL — ABNORMAL HIGH (ref 65–99)
Potassium: 4.4 mmol/L (ref 3.5–5.3)
Sodium: 132 mmol/L — ABNORMAL LOW (ref 135–146)
eGFR: 52 mL/min/{1.73_m2} — ABNORMAL LOW (ref >=60)

## 2021-11-22 ENCOUNTER — Ambulatory Visit: Payer: Medicare Other | Admitting: Nurse Practitioner

## 2021-12-21 ENCOUNTER — Encounter: Payer: Self-pay | Admitting: Gastroenterology

## 2022-01-13 ENCOUNTER — Ambulatory Visit (AMBULATORY_SURGERY_CENTER): Payer: Self-pay

## 2022-01-13 VITALS — Ht 60.0 in | Wt 115.0 lb

## 2022-01-13 DIAGNOSIS — D649 Anemia, unspecified: Secondary | ICD-10-CM

## 2022-01-13 DIAGNOSIS — R1310 Dysphagia, unspecified: Secondary | ICD-10-CM

## 2022-01-13 NOTE — Progress Notes (Signed)
No egg or soy allergy known to patient  No issues known to pt with past sedation with any surgeries or procedures Patient denies ever being told they had issues or difficulty with intubation  No FH of Malignant Hyperthermia Pt is not on diet pills Pt is not on home 02  Pt is not on blood thinners  Pt reports issues with constipation - patient reports issues with constipation-patient reports she deals with constipation vs diarrhea; patient reports she "really just deals with it; Have any cardiac testing pending--NO  Pre visit completed via phone; patient verified name, DOB, and address;  Patient's chart reviewed by Osvaldo Angst CNRA prior to previsit and patient appropriate for the [place].  Previsit completed and red dot placed by patient's name on their procedure day (on provider's schedule).

## 2022-01-27 ENCOUNTER — Encounter: Payer: Self-pay | Admitting: Gastroenterology

## 2022-01-27 ENCOUNTER — Ambulatory Visit (AMBULATORY_SURGERY_CENTER): Payer: Medicare Other | Admitting: Gastroenterology

## 2022-01-27 VITALS — BP 128/68 | HR 59 | Temp 98.0°F | Resp 11 | Ht 60.0 in | Wt 115.0 lb

## 2022-01-27 DIAGNOSIS — K449 Diaphragmatic hernia without obstruction or gangrene: Secondary | ICD-10-CM

## 2022-01-27 DIAGNOSIS — R933 Abnormal findings on diagnostic imaging of other parts of digestive tract: Secondary | ICD-10-CM | POA: Diagnosis not present

## 2022-01-27 DIAGNOSIS — M542 Cervicalgia: Secondary | ICD-10-CM

## 2022-01-27 MED ORDER — SODIUM CHLORIDE 0.9 % IV SOLN: 500.0000 mL | Freq: Once | INTRAVENOUS | Status: DC

## 2022-01-27 NOTE — Progress Notes (Signed)
Report to PACU, RN, vss, BBS= Clear.  

## 2022-01-27 NOTE — Op Note (Signed)
Jody [name]: Jody [name] Procedure Date: 01/27/2022 8:51 AM MRN: [id number] Endoscopist: [address]. Jody [name] , MD, [id number] Age: 81 Referring MD:  Date of Birth: [birth year] Gender: Female Account #: [id number] Procedure:                Upper GI endoscopy Indications:              Abnormal barium esophagram, Abnormal CT of the GI                            tract, Sore throat                           see 11/05/21 office consult note - abnl appearing                            esophagus on imaging                           unknown bariatric surgery in 1980's ("stapling") Medicines:                Monitored Anesthesia Care Procedure:                Pre-Anesthesia Assessment:                           - Prior to the procedure, a History and Physical                            was performed, and patient medications and                            allergies were reviewed. The patient's tolerance of                            previous anesthesia was also reviewed. The risks                            and benefits of the procedure and the sedation                            options and risks were discussed with the patient.                            All questions were answered, and informed consent                            was obtained. Prior Anticoagulants: The patient has                            taken no anticoagulant or antiplatelet agents. ASA                            Grade Assessment: III - A patient with severe  systemic disease. After reviewing the risks and                            benefits, the patient was deemed in satisfactory                            condition to undergo the procedure.                           After obtaining informed consent, the endoscope was                            passed under direct vision. Throughout the                            procedure, the patient's blood pressure, pulse, and                             oxygen saturations were monitored continuously. The                            GIF [id number] #[id number] was introduced through the                            mouth, and advanced to the second part of duodenum.                            The upper GI endoscopy was accomplished without                            difficulty. The patient tolerated the procedure                            well. Scope In: Scope Out: Findings:                 The lumen of the esophagus was markedly dilated wit                            little motility seen.                           The lower third of the esophagus was tortuous. The                            esophagus is also foreshortened (EGJ at 27cm) due                            to the hiatal hernia and anatomic changes.                           A hiatal hernia was present.Difficult to determine                            how large (due to  anatomic change) , but at least                            several centimeters. Heartbeat seen on opposite                            wall of proximal stomach.                           Evidence of a previous bariatric surgical                            anastomosis was found in the gastric body. This was                            characterized by an intact staple line. (? vertical                            banded gastroplasty)                           The exam of the stomach was otherwise normal.                           The examined duodenum was normal. Complications:            No immediate complications. Estimated Blood Loss:     Estimated blood loss: none. Impression:               - Dilation in the entire esophagus.                           - Tortuous esophagus.                           - Hiatal hernia.                           - A previous surgical anastomosis was found,                            characterized by an intact staple line.                           - Normal examined duodenum.                            - No specimens collected.                           These are chronic esophageal findings, possible                            related to prior surgery, hiatal hernia and a                            primary esophageal motility disorder. However, the  patient denies dysphagia to solids or liquids and                            has not weight loss. Little or no reflux on barium                            study. These findings are also of questionable                            relation to her right sided throat pain, which has                            been thoroughly worked up by ENT and benign. Recommendation:           - Patient has a contact number available for                            emergencies. The signs and symptoms of potential                            delayed complications were discussed with the                            patient. Return to normal activities tomorrow.                            Written discharge instructions were provided to the                            patient.                           - Resume previous diet.                           - Continue present medications.                           Keep head of bed elevated to decrease the risk of                            nocturnal aspiration. Otherwise, no medical or                            surgical therapies available for these esophageal                            findings.                           She denies heartburn or regurgitation and does not                            need acid suppression medicine. Jody Nam L. Jody Carrow, MD 01/27/2022 9:38:09 AM This report has been signed electronically.

## 2022-01-27 NOTE — Progress Notes (Signed)
History and Physical:  This patient presents for endoscopic testing for: Encounter Diagnoses  Name Primary?   Neck pain on right side Yes   Abnormal finding on GI tract imaging     81 year old woman seen in the office 11/05/2021 for right-sided neck pain after referral from ENT.  Imaging studies show chronic marked dilatation with tortuosity of the proximal to mid esophagus.  Barium study showed normal passage of barium tablet, patient reports no difficulty eating or drinking and is maintaining her weight.  Patient is otherwise without complaints or active issues today.   Past Medical History: Past Medical History:  Diagnosis Date   Anemia, unspecified    on meds   B12 deficiency due to diet    Basal cell carcinoma    LEFT UPPER ARM SUP. TX=CX3 5FU   Cataract    bilateral sx   Cramp of limb    Edema of both legs    Encounter for long-term (current) use of other medications    Hypertension    on meds   Lumbago    Melanoma ([place]) 10/18/2010   RIGHT POST SHOULDER =MOHS   Obesity    Osteoarthrosis, unspecified whether generalized or localized, unspecified site    generalized   Restless legs syndrome (RLS)    SCC (squamous cell carcinoma) 07/03/2017   RIGHT CHEST CX3 5FU   SCC (squamous cell carcinoma) 01/28/2019   LEFT FOREARM CX3 5FU   SCC (squamous cell carcinoma) [id number]   RIGHT FOREHEAD CX3 5FU   SCC (squamous cell carcinoma) 07/03/2017   RIGHT FOREARM CX3 5FU   SCCA (squamous cell carcinoma) of skin 04/08/2020   Right Anterior Mandible (in situ)(CX35FU)   SCCA (squamous cell carcinoma) of skin 04/08/2020   Left Supraorbital Region (Keratoacanthoma)   Senile osteoporosis    Squamous cell carcinoma of skin 01/30/2017   RIGHT JAWLINE TX WITH BX     Past Surgical History: Past Surgical History:  Procedure Laterality Date   CATARACT EXTRACTION W/ INTRAOCULAR LENS  IMPLANT, BILATERAL  04/2014   COLONOSCOPY     EYE SURGERY Right 04/28/2016   Macular   SPINE  SURGERY  2005   STOMACH SURGERY  1981   stapleing    TONSILLECTOMY     UPPER GASTROINTESTINAL ENDOSCOPY     VITRECTOMY Right    with membrane peeling for a macular pucker    Allergies: Allergies  Allergen Reactions   Shrimp [Shellfish Allergy] Anaphylaxis    ALL SEAFOOD   Prolia [Denosumab] Other (See Comments)    Chest pain   Percocet [Oxycodone-Acetaminophen]     Confusion     Outpatient Meds: Current Outpatient Medications  Medication Sig Dispense Refill   Calcium Carbonate-Vit D-Min (CALCIUM 1200 PO) Take 1 tablet by mouth daily.     celecoxib (CELEBREX) 100 MG capsule Take 1 capsule (100 mg total) by mouth 2 (two) times daily as needed. 180 capsule 2   Cholecalciferol (VITAMIN D) 2000 UNITS tablet Take 2,000 Units by mouth daily.     COLLAGEN PO Take 2 tablets by mouth daily at 6 (six) AM.     diphenhydramine-acetaminophen (TYLENOL PM) 25-500 MG TABS tablet Take 1 tablet by mouth at bedtime as needed.     gabapentin (NEURONTIN) 300 MG capsule Take 300 mg by mouth 3 (three) times daily.     Iron 66 MG TABS Take 1 tablet by mouth daily.      lisinopril (ZESTRIL) 5 MG tablet Take 1 tablet (5 mg total) by mouth daily.  Can take additional dose if bp <140/90 180 tablet 1   Multiple Vitamin (MULTIVITAMIN) tablet Take 1 tablet by mouth daily.     Cyanocobalamin (B-12 COMPLIANCE INJECTION IJ) Inject 1 mL as directed as directed. Every 60 days as of 05/24/2021 (Patient not taking: Reported on 01/13/2022)     Zoledronic Acid (RECLAST IV) as directed. Once yearly, last infusion was November 2022     Current Facility-Administered Medications  Medication Dose Route Frequency Provider Last Rate Last Admin   0.9 %  sodium chloride infusion  500 mL Intravenous Once Danis, Estill Cotta III, MD          ___________________________________________________________________ Objective   Exam:  BP 136/78   Pulse (!) 58   Temp 98 F (36.7 C) (Temporal)   Ht 5' (1.524 m)   Wt 115 lb (52.2  kg)   SpO2 100%   BMI 22.46 kg/m   CV: regular , S1/S2 Resp: clear to auscultation bilaterally, normal RR and effort noted GI: soft, no tenderness, with active bowel sounds.  Data:  CT scan of the neck June 2023 subsequent barium esophagram images personally reviewed  Assessment: Encounter Diagnoses  Name Primary?   Neck pain on right side Yes   Abnormal finding on GI tract imaging      Plan: EGD   The patient is appropriate for an endoscopic procedure in the ambulatory setting.   - Wilfrid Lund, MD

## 2022-01-27 NOTE — Progress Notes (Signed)
Pt's states no medical or surgical changes since previsit or office visit. 

## 2022-01-27 NOTE — Patient Instructions (Addendum)
Handout on hiatal hernia given to you today  Try to sleep with the head of the bed elevated at night to decrease the risk of aspiration    YOU HAD AN ENDOSCOPIC PROCEDURE TODAY AT [place]:   Refer to the procedure report that was given to you for any specific questions about what was found during the examination.  If the procedure report does not answer your questions, please call your gastroenterologist to clarify.  If you requested that your care partner not be given the details of your procedure findings, then the procedure report has been included in a sealed envelope for you to review at your convenience later.  YOU SHOULD EXPECT: Some feelings of bloating in the abdomen. Passage of more gas than usual.  Walking can help get rid of the air that was put into your GI tract during the procedure and reduce the bloating. If you had a lower endoscopy (such as a colonoscopy or flexible sigmoidoscopy) you may notice spotting of blood in your stool or on the toilet paper. If you underwent a bowel prep for your procedure, you may not have a normal bowel movement for a few days.  Please Note:  You might notice some irritation and congestion in your nose or some drainage.  This is from the oxygen used during your procedure.  There is no need for concern and it should clear up in a day or so.  SYMPTOMS TO REPORT IMMEDIATELY:  Following upper endoscopy (EGD)  Vomiting of blood or coffee ground material  New chest pain or pain under the shoulder blades  Painful or persistently difficult swallowing  New shortness of breath  Fever of 100F or higher  Black, tarry-looking stools  For urgent or emergent issues, a gastroenterologist can be reached at any hour by calling [phone]. Do not use MyChart messaging for urgent concerns.    DIET:  We do recommend a small meal at first, but then you may proceed to your regular diet.  Drink plenty of fluids but you should avoid alcoholic  beverages for 24 hours.  ACTIVITY:  You should plan to take it easy for the rest of today and you should NOT DRIVE or use heavy machinery until tomorrow (because of the sedation medicines used during the test).    FOLLOW UP: Our staff will call the number listed on your records the next business day following your procedure.  We will call around 7:15- 8:00 am to check on you and address any questions or concerns that you may have regarding the information given to you following your procedure. If we do not reach you, we will leave a message.      SIGNATURES/CONFIDENTIALITY: You and/or your care partner have signed paperwork which will be entered into your electronic medical record.  These signatures attest to the fact that that the information above on your After Visit Summary has been reviewed and is understood.  Full responsibility of the confidentiality of this discharge information lies with you and/or your care-partner.

## 2022-01-28 ENCOUNTER — Telehealth: Payer: Self-pay

## 2022-01-28 NOTE — Telephone Encounter (Signed)
  Follow up Call-     01/27/2022    8:42 AM  Call back number  Post procedure Call Back phone  # [phone]  Permission to leave phone message Yes     Patient questions:  Do you have a fever, pain , or abdominal swelling? No. Pain Score  0 *  Have you tolerated food without any problems? Yes.    Have you been able to return to your normal activities? Yes.    Do you have any questions about your discharge instructions: Diet   No. Medications  No. Follow up visit  No.  Do you have questions or concerns about your Care? No.  Actions: * If pain score is 4 or above: No action needed, pain <4.

## 2022-02-01 ENCOUNTER — Other Ambulatory Visit: Payer: Self-pay | Admitting: Internal Medicine

## 2022-02-01 DIAGNOSIS — M7989 Other specified soft tissue disorders: Secondary | ICD-10-CM

## 2022-02-03 ENCOUNTER — Other Ambulatory Visit: Payer: Self-pay

## 2022-02-03 ENCOUNTER — Telehealth: Payer: Self-pay | Admitting: Pharmacy Technician

## 2022-02-03 NOTE — Telephone Encounter (Signed)
Auth Submission: NO AUTH NEEDED Payer: MEDICARE A&B/ TRICARE Medication & CPT/J Code(s) submitted: Reclast (Zolendronic acid) T9276 Route of submission (phone, fax, portal):  Phone # Fax # Auth type: Buy/Bill Units/visits requested: X1 DOSE Reference number:  Approval from: 02/03/22 to 03/27/22

## 2022-02-07 ENCOUNTER — Other Ambulatory Visit: Payer: Medicare Other

## 2022-02-08 ENCOUNTER — Ambulatory Visit
Admission: RE | Admit: 2022-02-08 | Discharge: 2022-02-08 | Disposition: A | Discharge status: Home / Self Care | Payer: Medicare Other | Source: Ambulatory Visit | Attending: Internal Medicine | Admitting: Internal Medicine

## 2022-02-08 DIAGNOSIS — M7989 Other specified soft tissue disorders: Secondary | ICD-10-CM

## 2022-02-11 ENCOUNTER — Encounter: Payer: Self-pay | Admitting: Nurse Practitioner

## 2022-02-11 ENCOUNTER — Ambulatory Visit (INDEPENDENT_AMBULATORY_CARE_PROVIDER_SITE_OTHER): Payer: Medicare Other

## 2022-02-11 VITALS — BP 105/70 | HR 67 | Temp 97.9°F | Resp 18 | Ht 60.0 in | Wt 116.0 lb

## 2022-02-11 DIAGNOSIS — M810 Age-related osteoporosis without current pathological fracture: Secondary | ICD-10-CM | POA: Diagnosis not present

## 2022-02-11 MED ORDER — ACETAMINOPHEN 325 MG PO TABS
650.0000 mg | ORAL_TABLET | Freq: Once | ORAL | Status: AC
  Administered 2022-02-11: 650 mg via ORAL
  Filled 2022-02-11: qty 2

## 2022-02-11 MED ORDER — DIPHENHYDRAMINE HCL 25 MG PO CAPS
25.0000 mg | ORAL_CAPSULE | Freq: Once | ORAL | Status: AC
  Administered 2022-02-11: 25 mg via ORAL
  Filled 2022-02-11: qty 1

## 2022-02-11 MED ORDER — ZOLEDRONIC ACID 5 MG/100ML IV SOLN
5.0000 mg | Freq: Once | INTRAVENOUS | Status: AC
  Administered 2022-02-11: 5 mg via INTRAVENOUS
  Filled 2022-02-11: qty 100

## 2022-02-11 MED ORDER — SODIUM CHLORIDE 0.9 % IV SOLN: INTRAVENOUS | Status: DC

## 2022-02-11 NOTE — Patient Instructions (Signed)

## 2022-02-11 NOTE — Progress Notes (Signed)
Diagnosis: Osteoporosis  Provider:  Marshell Garfinkel MD  Procedure: Infusion  IV Type: Peripheral, IV Location: L Forearm  Reclast (Zolendronic Acid), Dose: 5 mg  Infusion Start Time: 0918  Infusion Stop Time: 0802  Post Infusion IV Care: Observation period completed and Peripheral IV Discontinued  Discharge: Condition: Good, Destination: Home . AVS provided to patient.   Performed by:  Cleophus Molt, RN

## 2022-06-01 ENCOUNTER — Telehealth: Payer: Self-pay | Admitting: Orthopaedic Surgery

## 2022-06-01 NOTE — Telephone Encounter (Signed)
CD is ready!

## 2022-06-01 NOTE — Telephone Encounter (Signed)
Received call from patient. She needs records and xrays of knees & L-spine. Please copy xrays of knees and L-spine from 2018-present to CD. Please let me know when ready. Thanks you!

## 2022-06-01 NOTE — Telephone Encounter (Signed)
IC, spoke with patient. Advised record & xrays are ready for her to pick up. Authorization is attached for her to sign.

## 2022-08-13 ENCOUNTER — Encounter: Payer: Self-pay | Admitting: Nurse Practitioner

## 2022-08-26 ENCOUNTER — Other Ambulatory Visit: Payer: Self-pay

## 2022-08-26 DIAGNOSIS — R202 Paresthesia of skin: Secondary | ICD-10-CM

## 2022-08-29 ENCOUNTER — Ambulatory Visit (INDEPENDENT_AMBULATORY_CARE_PROVIDER_SITE_OTHER): Payer: Medicare Other | Admitting: Neurology

## 2022-08-29 DIAGNOSIS — R202 Paresthesia of skin: Secondary | ICD-10-CM

## 2022-08-29 DIAGNOSIS — G629 Polyneuropathy, unspecified: Secondary | ICD-10-CM

## 2022-08-29 NOTE — Procedures (Signed)
[facility]  [address], [address]  [place], [place] [address] Tel: [phone] Fax: [phone] Test Date:  08/29/2022  Patient: Jody [name] DOB: 1940/07/27 Physician: Jacquelyne Balint, MD  Sex: Female Height: 5\' 0"  Ref Phys: [name], MD  ID#: [id number]   Technician:    History: This is an 82 year old female with numbness and tingling in bilateral lower limbs.  NCV & EMG Findings: Extensive electrodiagnostic evaluation of the right and left lower limbs shows: Bilateral sural and superficial peroneal/fibular sensory responses are absent. Right peroneal/fibular (EDB) motor response is absent. Left peroneal/fibular (EDB) motor response shows reduced amplitude (1.23 mV). Bilateral tibial (AH) motor responses show reduced amplitude (L2.8, R3.6 mV). Bilateral peroneal/fibular (TA) motor responses are within normal limits. Bilateral H reflex is absent. Chronic motor axon loss changes without accompanying active denervation changes are seen in bilateral tibialis anterior and bilateral medial head of gastrocnemius muscles.  Impression: This is an abnormal study. 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 The severity is difficult to grade as some of changes can be related to age, but likely at least moderate in degree electrically. No definite electrodiagnostic evidence of a right or left lumbosacral (L3-S1) motor radiculopathy.    ___________________________ Jacquelyne Balint, MD    Nerve Conduction Studies Motor Nerve Results    Latency Amplitude F-Lat Segment Distance CV Comment  Site (ms) Norm (mV) Norm (ms)  (cm) (m/s) Norm   Left Fibular (EDB) Motor  Ankle 5.9  < 6.0 *1.23  > 2.5        Bel fib head 13.0 - 1.23 -  Bel fib head-Ankle 28 *39  > 40   Pop fossa 15.4 - 1.11 -  Pop fossa-Bel fib head 9 38 -   Right Fibular (EDB) Motor  Ankle *NR  < 6.0 *NR  > 2.5        Bel fib head  *NR - *NR -  Bel fib head-Ankle - *NR  > 40   Pop fossa *NR - *NR -  Pop fossa-Bel fib head - *NR -   Left Fibular (TA) Motor  Fib head 2.2  < 4.5 4.3  > 3.0        Pop fossa 4.2  < 6.7 4.0 -  Pop fossa-Fib head 9 45  > 40   Right Fibular (TA) Motor  Fib head 1.68  < 4.5 4.4  > 3.0        Pop fossa 3.9  < 6.7 4.3 -  Pop fossa-Fib head 9 41  > 40   Left Tibial (AH) Motor  Ankle 4.3  < 6.0 *2.8  > 4.0        Knee 12.5 - 2.0 -  Knee-Ankle 38 46  > 40   Right Tibial (AH) Motor  Ankle 4.3  < 6.0 *3.6  > 4.0        Knee 12.3 - 2.7 -  Knee-Ankle 40 50  > 40    Sensory Sites    Neg Peak Lat Amplitude (O-P) Segment Distance Velocity Comment  Site (ms) Norm (V) Norm  (cm) (ms)   Left Superficial Fibular Sensory  14 cm-Ankle *NR  < 4.6 *NR  > 3 14 cm-Ankle 14    Right Superficial Fibular Sensory  14 cm-Ankle *NR  < 4.6 *NR  > 3 14 cm-Ankle 14    Left Sural Sensory  Calf-Lat mall *NR  < 4.6 *  NR  > 3 Calf-Lat mall 14    Right Sural Sensory  Calf-Lat mall *NR  < 4.6 *NR  > 3 Calf-Lat mall 10     H-Reflex Results    M-Lat H Lat H Neg Amp H-M Lat  Site (ms) (ms) Norm (mV) (ms)  Left Tibial H-Reflex  Pop fossa 5.6 -  < 35.0 - -  Right Tibial H-Reflex  Pop fossa 5.6 -  < 35.0 - -   Electromyography   Side Muscle Ins.Act Fibs Fasc Recrt Amp Dur Poly Activation Comment  Left Tib ant Nml Nml Nml *2- *2+ *2+ *1+ Nml N/A  Left Gastroc MH Nml Nml Nml *2- *1+ *1+ *1+ Nml N/A  Left Rectus fem Nml Nml Nml Nml Nml Nml Nml Nml N/A  Left Biceps fem SH Nml Nml Nml Nml Nml Nml Nml Nml N/A  Left Gluteus med Nml Nml Nml Nml Nml Nml Nml Nml N/A  Right Tib ant Nml Nml Nml *2- *2+ *2+ *1+ Nml N/A  Right Gastroc MH Nml Nml Nml *2- *1+ *1+ *1+ Nml N/A  Right Rectus fem Nml Nml Nml Nml Nml Nml Nml Nml N/A  Right Biceps fem SH Nml Nml Nml Nml Nml Nml Nml Nml N/A  Right Gluteus med Nml Nml Nml Nml Nml Nml Nml Nml N/A      Waveforms:  Motor               Sensory           H-Reflex

## 2022-08-31 ENCOUNTER — Telehealth: Payer: Self-pay | Admitting: Neurology

## 2022-08-31 NOTE — Telephone Encounter (Signed)
Patient called her referring provider [facility] office and they do not have the report so that she can get the results. She would like to have them sent to her

## 2022-08-31 NOTE — Telephone Encounter (Signed)
Called patient and left a detailed message informing her that she will need to contact Dr. Lorrene Reid office for the results. I also informed her that I have faxed results over to Dr. Lorrene Reid and that Dr. Loleta Chance had already forwarded the results to Dr. Lorrene Reid.

## 2022-10-07 NOTE — Progress Notes (Signed)
Initial neurology clinic note  Reason for Evaluation: Consultation requested by Combs, Prince Solian, * for an opinion regarding numbness and tingling in feet. My final recommendations will be communicated back to the requesting physician by way of shared medical record or letter to requesting physician via [place] mail.  HPI: This is [name], a 82 y.o. right-handed female with a medical history of HTN, CKD3, RLS, OA, insomnia, stomach stapled (1980) who presents to neurology clinic with the chief complaint of numbness and tingling in feet. The patient is alone today.  Patient first noticed symptoms in 2017. She fell and broke her patella. While she was going to ortho, she mentioned numbness, tingling, burning in feet. She felt like she didn't know if she had socks on. She does not think her symptoms have worsened, but she feels less tolerant of the symptoms. After she fell in 2017, she started using a cane because she didn't want to fall again. She does not use it in the house. She has osteoporosis and knows she can break a bone easily. She does endorse imbalance. She occasionally stumbles and will fall, but usually related to the cat getting under her feet. She has fallen maybe 3 times in the last year.  [facility]. She had back surgery in 2005 (lumbar spine) and mentions the spine above and below is "mush". She does not want to have another surgery. She takes gabapentin. She is prescribed 300 mg TID but usually only takes in the morning and night. She takes Celebrex as well. She thinks both are working for her back. She does not think the gabapentin is helping with the neuropathy as much. She has tried Lyrica and Nortriptyline (for sleep), but these did not work.  She endorses leg cramps. She endorses some difficulty with controlling urine as she has gotten older. Her bowel function fluctuates from constipation to diarrhea on an unpredictable  pattern. She does not take anything for this.  She does not report any constitutional symptoms like fever, night sweats, anorexia or unintentional weight loss. She has lost about 60 lbs on purpose since 2022 because she felt she was carrying too much weight. Her weight has been stable for the last year.  EtOH use: None  Restrictive diet? No, but has "terrible eating habits". She is "addicted to carbs and sugar." Family history of neuropathy/myopathy/neurologic disease? Dad and brother had neuropathy  EMG performed by me on 08/29/22 was consistent with a length dependent sensorimotor neuropathy (axonal).   MEDICATIONS:  Outpatient Encounter Medications as of 10/21/2022  Medication Sig   B Complex-C (B-COMPLEX WITH VITAMIN C) tablet Take 1 tablet by mouth daily.   Calcium Carbonate-Vit D-Min (CALCIUM 1200 PO) Take 1 tablet by mouth daily.   celecoxib (CELEBREX) 100 MG capsule Take 1 capsule (100 mg total) by mouth 2 (two) times daily as needed. (Patient taking differently: Take 100 mg by mouth daily.)   gabapentin (NEURONTIN) 300 MG capsule Take 300 mg by mouth 3 (three) times daily.   Iron 66 MG TABS Take 1 tablet by mouth daily.    lisinopril (ZESTRIL) 5 MG tablet Take 1 tablet (5 mg total) by mouth daily. Can take additional dose if bp <140/90   Multiple Vitamin (MULTIVITAMIN) tablet Take 1 tablet by mouth daily.   Zoledronic Acid (RECLAST IV) as directed. Once yearly, last infusion was November 2022   Cyanocobalamin (B-12 COMPLIANCE INJECTION IJ) Inject 1 mL as directed as directed.  Every 60 days as of 05/24/2021 (Patient not taking: Reported on 01/13/2022)   [DISCONTINUED] Cholecalciferol (VITAMIN D) 2000 UNITS tablet Take 2,000 Units by mouth daily.   [DISCONTINUED] COLLAGEN PO Take 2 tablets by mouth daily at 6 (six) AM.   [DISCONTINUED] diphenhydramine-acetaminophen (TYLENOL PM) 25-500 MG TABS tablet Take 1 tablet by mouth at bedtime as needed.   No facility-administered encounter  medications on file as of 10/21/2022.    PAST MEDICAL HISTORY: Past Medical History:  Diagnosis Date   Anemia, unspecified    on meds   B12 deficiency due to diet    Basal cell carcinoma    LEFT UPPER ARM SUP. TX=CX3 5FU   Cataract    bilateral sx   Cramp of limb    Edema of both legs    Encounter for long-term (current) use of other medications    Hypertension    on meds   Lumbago    Melanoma (HCC) 10/18/2010   RIGHT POST SHOULDER =MOHS   Obesity    Osteoarthrosis, unspecified whether generalized or localized, unspecified site    generalized   Restless legs syndrome (RLS)    SCC (squamous cell carcinoma) 07/03/2017   RIGHT CHEST CX3 5FU   SCC (squamous cell carcinoma) 01/28/2019   LEFT FOREARM CX3 5FU   SCC (squamous cell carcinoma) [id number]   RIGHT FOREHEAD CX3 5FU   SCC (squamous cell carcinoma) 07/03/2017   RIGHT FOREARM CX3 5FU   SCCA (squamous cell carcinoma) of skin 04/08/2020   Right Anterior Mandible (in situ)(CX35FU)   SCCA (squamous cell carcinoma) of skin 04/08/2020   Left Supraorbital Region (Keratoacanthoma)   Senile osteoporosis    Squamous cell carcinoma of skin 01/30/2017   RIGHT JAWLINE TX WITH BX    PAST SURGICAL HISTORY: Past Surgical History:  Procedure Laterality Date   CATARACT EXTRACTION W/ INTRAOCULAR LENS  IMPLANT, BILATERAL  04/2014   COLONOSCOPY     EYE SURGERY Right 04/28/2016   Macular   SPINE SURGERY  2005   STOMACH SURGERY  1981   stapleing    TONSILLECTOMY     UPPER GASTROINTESTINAL ENDOSCOPY     VITRECTOMY Right    with membrane peeling for a macular pucker    ALLERGIES: Allergies  Allergen Reactions   Shrimp [Shellfish Allergy] Anaphylaxis    ALL SEAFOOD   Prolia [Denosumab] Other (See Comments)    Chest pain   Percocet [Oxycodone-Acetaminophen]     Confusion     FAMILY HISTORY: Family History  Problem Relation Age of Onset   Diabetes Father    Prostate cancer Father    Rectal cancer Sister 34   Colon  polyps Sister    Cancer Sister    Diabetes Brother    Prostate cancer Brother    Lung cancer Brother 19   Stomach cancer Maternal Grandmother    Pancreatic cancer Neg Hx    Colon cancer Neg Hx    Esophageal cancer Neg Hx     SOCIAL HISTORY: Social History   Tobacco Use   Smoking status: Former    Current packs/day: 0.00    Types: Cigarettes    Start date: 1955    Quit date: 1975    Years since quitting: 49.6   Smokeless tobacco: Never   Tobacco comments:    Quit in early 30's  Vaping Use   Vaping status: Never Used  Substance Use Topics   Alcohol use: No    Alcohol/week: 0.0 standard drinks of alcohol   Drug  use: No   Social History   Social History Narrative   Married -Dorinda Bobby Barton married 1966   Former Smoker stopped 1977   Alcohol none   Exercise -Deep water 4 times a week for one hour   POA   Are you right handed or left handed? right   Are you currently employed ?    What is your current occupation?   Do you live at home alone?   Who lives with you   What type of home do you live in: 1 story or 2 story? one         OBJECTIVE: PHYSICAL EXAM: BP 130/71   Pulse 64   Ht 5' (1.524 m)   Wt 114 lb (51.7 kg)   SpO2 98%   BMI 22.26 kg/m   General: General appearance: Awake and alert. No distress. Cooperative with exam.  Skin: No obvious rash or jaundice. HEENT: Atraumatic. Anicteric. Lungs: Non-labored breathing on room air  Heart: Regular Abdomen: Soft, non tender. Extremities: No edema. Arthritic changes in hands and feet; high arches Psych: Affect appropriate.  Neurological: Mental Status: Alert. Speech fluent. No pseudobulbar affect Cranial Nerves: CNII: No RAPD. Visual fields grossly intact. CNIII, IV, VI: PERRL. No nystagmus. EOMI. CN V: Facial sensation intact bilaterally to fine touch. CN VII: Facial muscles symmetric and strong. No ptosis at rest. CN VIII: Hearing grossly intact bilaterally. CN IX: No hypophonia. CN X: Palate elevates  symmetrically. CN XI: Full strength shoulder shrug bilaterally. CN XII: Tongue protrusion full and midline. No atrophy or fasciculations. No significant dysarthria Motor: Tone is normal. Strength is 5/5 in bilateral upper and lower extremities. Reflexes:  Right Left   Bicep 1+ 1+   Tricep 1+ 1+   BrRad 1+ 1+   Knee Trace Trace   Ankle 0 0    Pathological Reflexes: Babinski: flexor response bilaterally Hoffman: absent bilaterally Troemner: absent bilaterally Sensation: Pinprick: Intact in all extremities. Vibration: Diminished to knees in bilateral lower extremities. Intact in upper extremities. Proprioception: Intact in bilateral great toes. Coordination: Intact finger-to- nose-finger bilaterally. Romberg negative. Gait: Able to rise from chair with arms crossed unassisted. Normal, narrow-based gait.  Lab and Test Review: Internal labs: Folate (11/05/21): wnl  External labs: 08/16/22: Ferritin: 211 HbA1c: 5.7 CMP unremarkable Vit D wnl IFE: no M protein TSH: wnl (4.36) CBC unremarkable B12: 484  Imaging: MRI cervical spine wo contrast (06/19/20): FINDINGS: Alignment: Grade 1 retrolisthesis at C3-4 and C5-6   Vertebrae: No fracture, evidence of discitis, or bone lesion.   Cord: Normal signal and morphology.   Posterior Fossa, vertebral arteries, paraspinal tissues: Negative.   Disc levels:   C1-2: Unremarkable.   C2-3: Normal disc space and facet joints. There is no spinal canal stenosis. No neural foraminal stenosis.   C3-4: Small disc bulge. There is no spinal canal stenosis. Moderate left neural foraminal stenosis.   C4-5: Disc space narrowing with fusion across the posterior aspect of the disc space. There is no spinal canal stenosis. No neural foraminal stenosis.   C5-6: Disc space narrowing with small bulge and uncovertebral hypertrophy. There is no spinal canal stenosis. Moderate right neural foraminal stenosis.   C6-7: Normal disc space and  facet joints. There is no spinal canal stenosis. No neural foraminal stenosis.   C7-T1: Normal disc space and facet joints. There is no spinal canal stenosis. No neural foraminal stenosis.   IMPRESSION: 1. Moderate left C3-4 and right C5-6 neural foraminal stenosis. 2. No spinal canal stenosis.  MRI  lumbar spine wo contrast (04/12/16): FINDINGS: SEGMENTATION: For the purposes of this report, the last well-formed intervertebral disc will be described as L5-S1.   ALIGNMENT: Maintenance of the lumbar lordosis. Grade 1 increased L4-5 anterolisthesis (5 mm, previously 2 mm). Lower lumbar levoscoliosis inferred on axial sequences.   VERTEBRAE:Vertebral bodies are intact. Status post L3-4 PLIF and posterior decompression. Severe L2-3 disc height loss and proportional chronic discogenic endplate changes. Moderate L4-5 and L5-S1 disc height loss, desiccation and vacuum disc and acute on chronic discogenic endplate change. Severe T11-12 degenerative disc and moderate acute on chronic discogenic endplate changes. Small T12-L1   CONUS MEDULLARIS: Conus medullaris terminates at L1-2 and demonstrates normal morphology and signal characteristics. Cauda equina is normal.   PARASPINAL AND SOFT TISSUES: Included prevertebral and paraspinal soft tissues are nonacute. Partially imaged sigmoid diverticulosis.   DISC LEVELS:   T12-L1: Small broad-based disc bulge and small central disc protrusion without canal stenosis or neural foraminal narrowing.   L1-2: Annular bulging. No canal stenosis or neural foraminal narrowing.   L2-3: Moderate broad-based disc bulge, endplate spurring and mild facet arthropathy with ligamentum flavum redundancy. Mild to moderate canal stenosis and trefoil configuration. Minimal suspected bilateral neural foraminal narrowing may be overestimated by hardware artifact.   L3-4: PLIF and posterior instrumentation without canal stenosis or neural foraminal  narrowing.   L4-5: Anterolisthesis. Moderate broad-based disc bulge. Severe facet arthropathy and ligamentum flavum redundancy with trace facet effusions which are likely reactive. Moderate canal stenosis. Severe RIGHT, mild LEFT neural foraminal narrowing.   L5-S1: 11 x 11 mm RIGHT central disc extrusion with 12 mm of contiguous cephalad migration displaces the traversing RIGHT L5 nerve. Severe facet arthropathy and ligamentum flavum redundancy with trace facet effusions which are likely reactive. 4 mm synovial cyst migrated into the dorsal epidural space. 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 Mild canal stenosis and encroachment upon the traversing S1 nerve within the lateral recess. Moderate to severe LEFT neural foraminal narrowing.   IMPRESSION: Large L5-S1 disc extrusion displaces the traversing RIGHT L5 nerve and encroaches upon the traversing RIGHT S1 nerve.   L3-4 PLIF and posterior decompression. 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   Multilevel neural foraminal narrowing: Severe on the RIGHT at L4-5, moderate to severe on the LEFT at L5-S1.  EMG (08/29/22): NCV & EMG Findings: Extensive electrodiagnostic evaluation of the right and left lower limbs shows: Bilateral sural and superficial peroneal/fibular sensory responses are absent. Right peroneal/fibular (EDB) motor response is absent. Left peroneal/fibular (EDB) motor response shows reduced amplitude (1.23 mV). Bilateral tibial (AH) motor responses show reduced amplitude (L2.8, R3.6 mV). Bilateral peroneal/fibular (TA) motor responses are within normal limits. Bilateral H reflex is absent. Chronic motor axon loss changes without accompanying active denervation changes are seen in bilateral tibialis anterior and bilateral  medial head of gastrocnemius muscles.   Impression: This is an abnormal study. 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 The severity is difficult to grade as some of changes can be related to age, but likely at least moderate in degree electrically. No definite electrodiagnostic evidence of a right or left lumbosacral (L3-S1) motor radiculopathy.  ASSESSMENT: Jody [name] is a 82 y.o. female who presents for evaluation of numbness, tingling, and burning in feet. She  has a relevant medical history of HTN, CKD3, RLS, OA, insomnia, stomach stapled (1980). Her neurological examination is pertinent for diminished vibratory sensation in bilateral lower extremities. Available diagnostic data is significant for normal HbA1c, B12, and IFE. MRI lumbar spine showed multilevel foraminal stenosis, severe at right L4-5, and moderate to severe at left L5-S1. EMG was most consistent with a moderate axonal sensorimotor neuropathy, though some changes seen could be related to age. 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 I will look for other treatable causes as below. Her symptoms are worse at night, so I will recommend subtle changes to gabapentin as well.  PLAN: -Blood work: B1, B6 -Change gabapentin to 300 mg in the morning, 600 mg at night -Lidocaine cream PRN  -Alpha lipoic acid 600 mg once or twice daily  -Return to clinic in 6 months.  The impression above as well as the plan as outlined below were extensively discussed with the patient who voiced understanding. All questions were answered to their satisfaction.  The patient was counseled on pertinent fall precautions per the printed material provided today, and as noted under the "Patient Instructions" section below.  When  available, results of the above investigations and possible further recommendations will be communicated to the patient via telephone/MyChart. Patient to call office if not contacted after expected testing turnaround time.   Total time spent reviewing records, interview, history/exam, documentation, and coordination of care on day of encounter:  50 min   Thank you for allowing me to participate in patient's care.  If I can answer any additional questions, I would be pleased to do so.  Jacquelyne Balint, MD   CC: No primary care provider on file. No primary provider on file.  CC: Referring provider: [name], DO [address] [place],  [place] [address]

## 2022-10-21 ENCOUNTER — Ambulatory Visit (INDEPENDENT_AMBULATORY_CARE_PROVIDER_SITE_OTHER): Payer: Medicare Other | Admitting: Neurology

## 2022-10-21 ENCOUNTER — Other Ambulatory Visit (INDEPENDENT_AMBULATORY_CARE_PROVIDER_SITE_OTHER): Payer: Medicare Other

## 2022-10-21 ENCOUNTER — Encounter: Payer: Self-pay | Admitting: Neurology

## 2022-10-21 VITALS — BP 130/71 | HR 64 | Ht 60.0 in | Wt 114.0 lb

## 2022-10-21 DIAGNOSIS — M1990 Unspecified osteoarthritis, unspecified site: Secondary | ICD-10-CM

## 2022-10-21 DIAGNOSIS — R202 Paresthesia of skin: Secondary | ICD-10-CM | POA: Diagnosis not present

## 2022-10-21 DIAGNOSIS — G629 Polyneuropathy, unspecified: Secondary | ICD-10-CM

## 2022-10-21 NOTE — Patient Instructions (Addendum)
I saw you today for neuropathy.  I would like to do blood work today. I will be in touch when I have your results.  I would like to change your gabapentin. Take 300 mg in the morning and 600 mg at night.  Alpha lipoic acid 600mg  daily has some research data suggesting it helps with nerve health. No major side effects other than <1% of people report upset stomach. This can be taken twice per day (1200mg  daily) if no relief obtained. You can buy this over the counter or online.  You can also try Lidocaine cream as needed. Apply wear you have pain, tingling, or burning. Wear gloves to prevent your hands being numb. This can be bought over the counter at any drug store or online.  I would like to see you again in 6 months. Please let me know if you have any questions or concerns in the meantime.   The physicians and staff at [facility] are committed to providing excellent care. You may receive a survey requesting feedback about your experience at our office. We strive to receive "very good" responses to the survey questions. If you feel that your experience would prevent you from giving the office a "very good " response, please contact our office to try to remedy the situation. We may be reached at [phone]. Thank you for taking the time out of your busy day to complete the survey.  Jacquelyne Balint, MD [facility]  Preventing Falls at [facility] are common, often dreaded events in the lives of older people. Aside from the obvious injuries and even death that may result, fall can cause wide-ranging consequences including loss of independence, mental decline, decreased activity and mobility. Younger people are also at risk of falling, especially those with chronic illnesses and fatigue.  Ways to reduce risk for falling Examine diet and medications. Warm foods and alcohol dilate blood vessels, which can lead to dizziness when standing. Sleep aids, antidepressants and pain medications  can also increase the likelihood of a fall.  Get a vision exam. Poor vision, cataracts and glaucoma increase the chances of falling.  Check foot gear. Shoes should fit snugly and have a sturdy, nonskid sole and a broad, low heel  Participate in a physician-approved exercise program to build and maintain muscle strength and improve balance and coordination. Programs that use ankle weights or stretch bands are excellent for muscle-strengthening. Water aerobics programs and low-impact Tai Chi programs have also been shown to improve balance and coordination.  Increase vitamin D intake. Vitamin D improves muscle strength and increases the amount of calcium the body is able to absorb and deposit in bones.  How to prevent falls from common hazards Floors - Remove all loose wires, cords, and throw rugs. Minimize clutter. Make sure rugs are anchored and smooth. Keep furniture in its usual place.  Chairs -- Use chairs with straight backs, armrests and firm seats. Add firm cushions to existing pieces to add height.  Bathroom - Install grab bars and non-skid tape in the tub or shower. Use a bathtub transfer bench or a shower chair with a back support Use an elevated toilet seat and/or safety rails to assist standing from a low surface. Do not use towel racks or bathroom tissue holders to help you stand.  Lighting - Make sure halls, stairways, and entrances are well-lit. Install a night light in your bathroom or hallway. Make sure there is a light switch at the top and bottom of the  staircase. Turn lights on if you get up in the middle of the night. Make sure lamps or light switches are within reach of the bed if you have to get up during the night.  Kitchen - Install non-skid rubber mats near the sink and stove. Clean spills immediately. Store frequently used utensils, pots, pans between waist and eye level. This helps prevent reaching and bending. Sit when getting things out of lower cupboards.  Living  room/ Bedrooms - Place furniture with wide spaces in between, giving enough room to move around. Establish a route through the living room that gives you something to hold onto as you walk.  Stairs - Make sure treads, rails, and rugs are secure. Install a rail on both sides of the stairs. If stairs are a threat, it might be helpful to arrange most of your activities on the lower level to reduce the number of times you must climb the stairs.  Entrances and doorways - Install metal handles on the walls adjacent to the doorknobs of all doors to make it more secure as you travel through the doorway.  Tips for maintaining balance Keep at least one hand free at all times. Try using a backpack or fanny pack to hold things rather than carrying them in your hands. Never carry objects in both hands when walking as this interferes with keeping your balance.  Attempt to swing both arms from front to back while walking. This might require a conscious effort if Parkinson's disease has diminished your movement. It will, however, help you to maintain balance and posture, and reduce fatigue.  Consciously lift your feet off of the ground when walking. Shuffling and dragging of the feet is a common culprit in losing your balance.  When trying to navigate turns, use a "U" technique of facing forward and making a wide turn, rather than pivoting sharply.  Try to stand with your feet shoulder-length apart. When your feet are close together for any length of time, you increase your risk of losing your balance and falling.  Do one thing at a time. Don't try to walk and accomplish another task, such as reading or looking around. The decrease in your automatic reflexes complicates motor function, so the less distraction, the better.  Do not wear rubber or gripping soled shoes, they might "catch" on the floor and cause tripping.  Move slowly when changing positions. Use deliberate, concentrated movements and, if needed,  use a grab bar or walking aid. Count 15 seconds between each movement. For example, when rising from a seated position, wait 15 seconds after standing to begin walking.  If balance is a continuous problem, you might want to consider a walking aid such as a cane, walking stick, or walker. Once you've mastered walking with help, you might be ready to try it on your own again.

## 2022-11-24 IMAGING — US US THYROID
1 series · 13 of 25 positions shown · non-contrast
Comparison: None Available.

CLINICAL DATA: 81-year-old female with history of odynophagia.

EXAM:
THYROID ULTRASOUND
TECHNIQUE: Ultrasound examination of the thyroid gland and adjacent soft
tissues was performed.

[Series 1: us thyroid · 0.04mm/px · 13 of 52 slices shown]
[im 1/52]
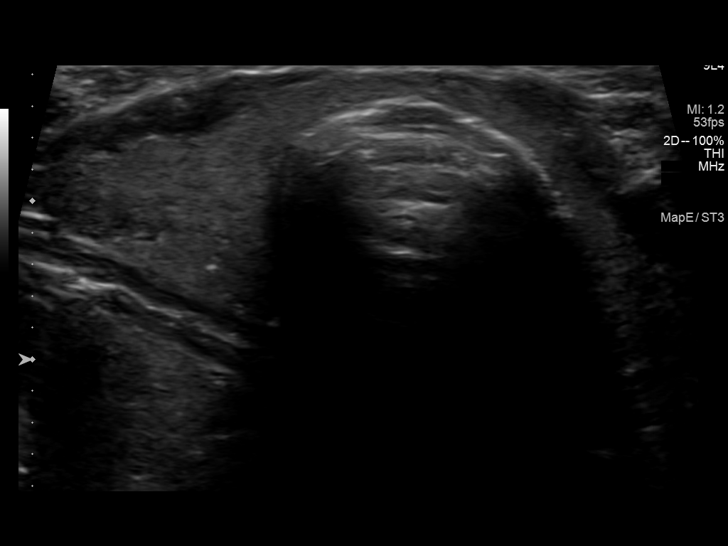
[im 5/52]
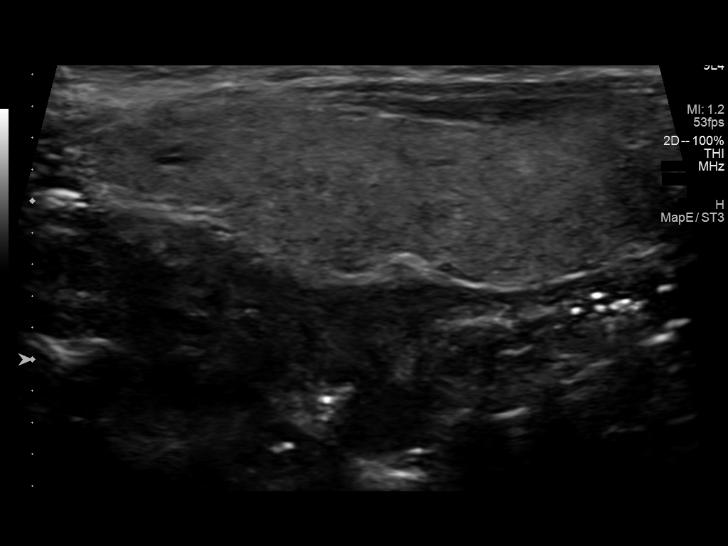
[im 9/52]
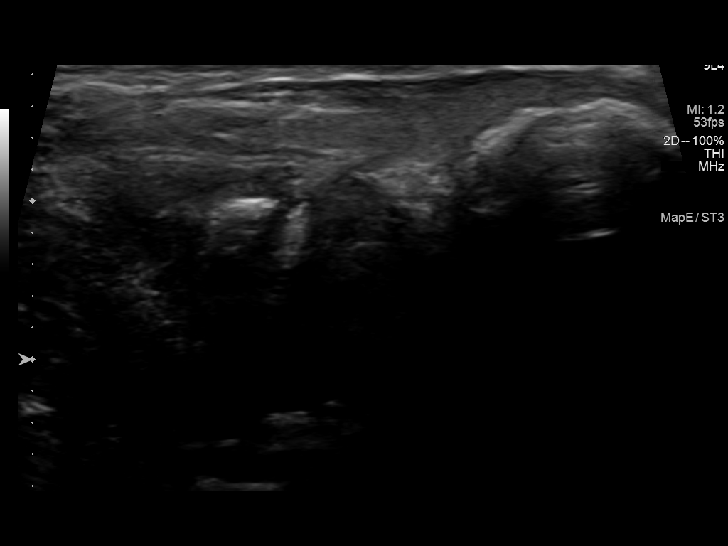
[im 13/52]
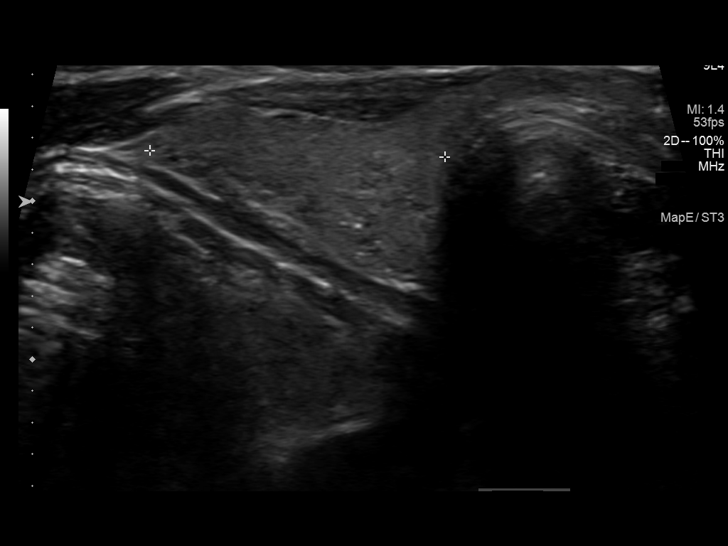
[im 18/52]
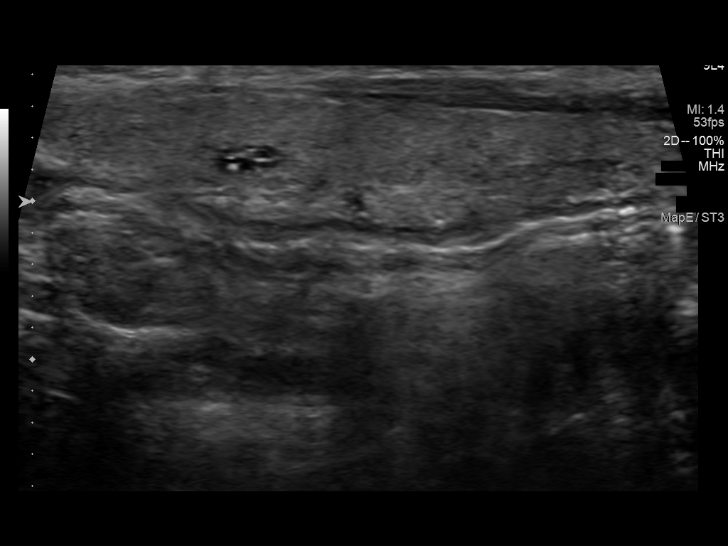
[im 22/52]
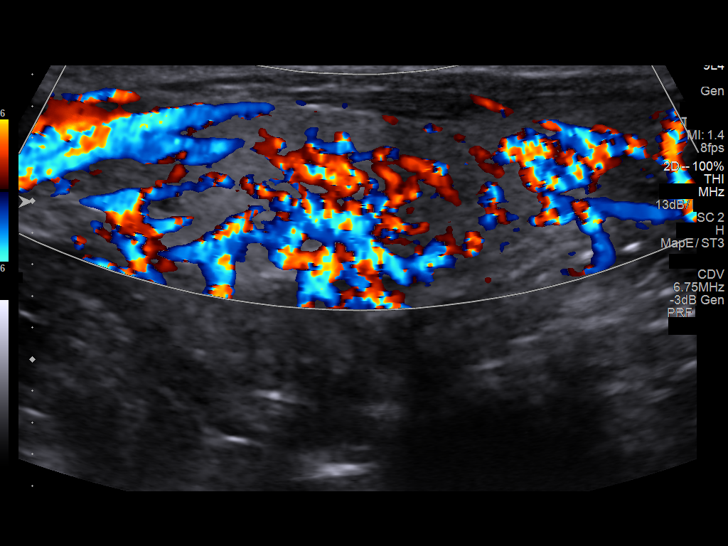
[im 26/52]
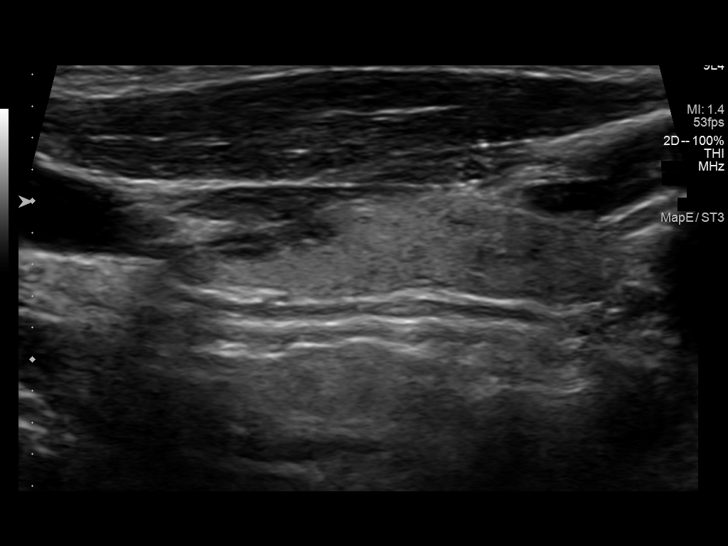
[im 30/52]
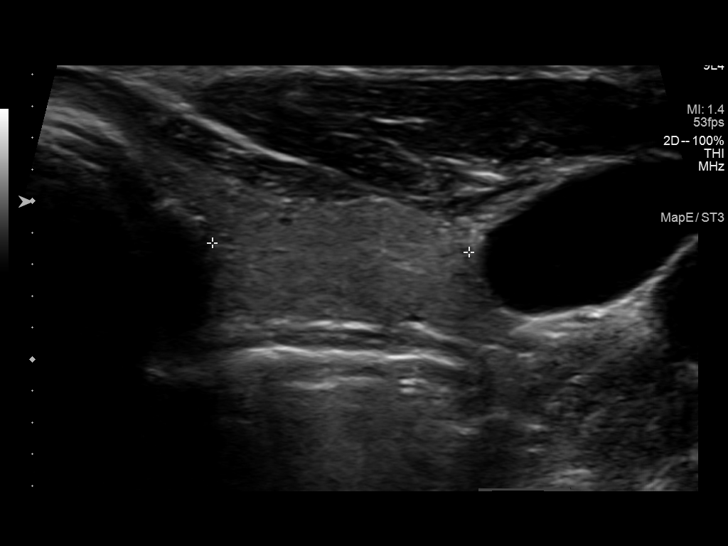
[im 35/52]
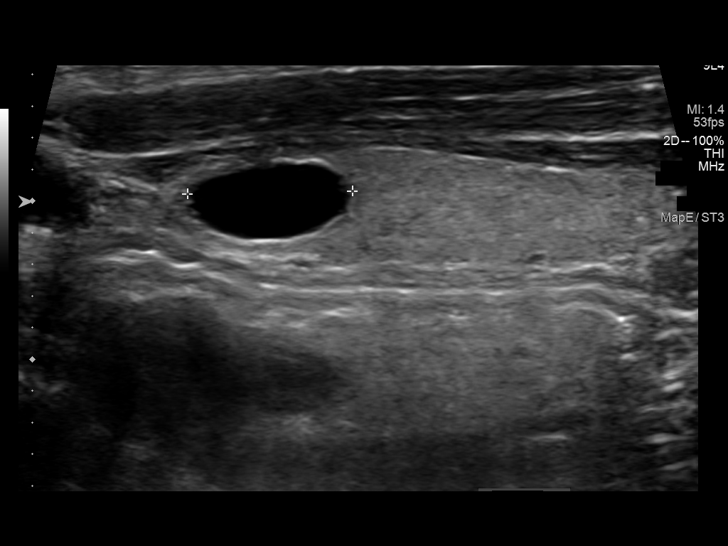
[im 39/52]
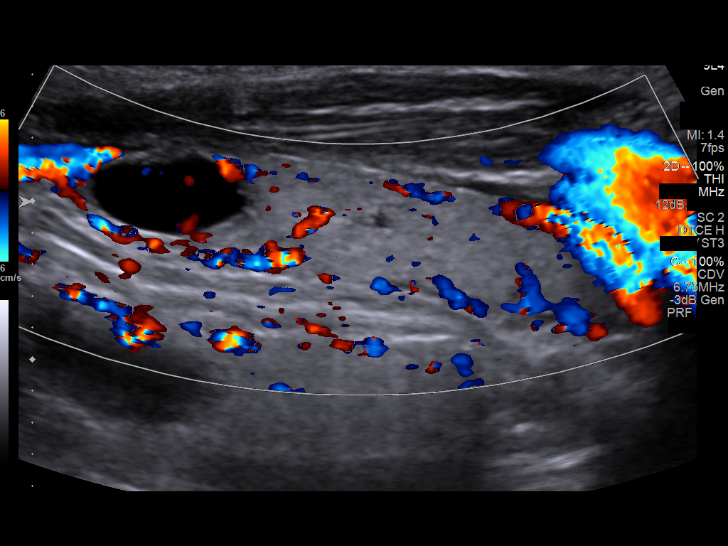
[im 43/52]
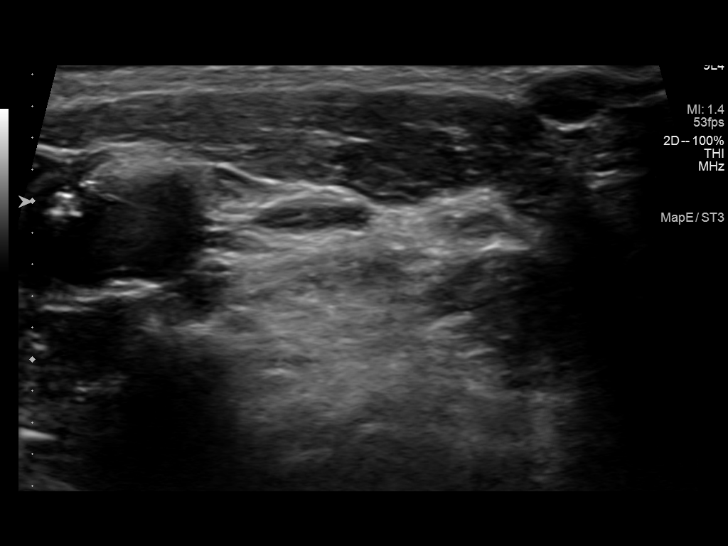
[im 47/52]
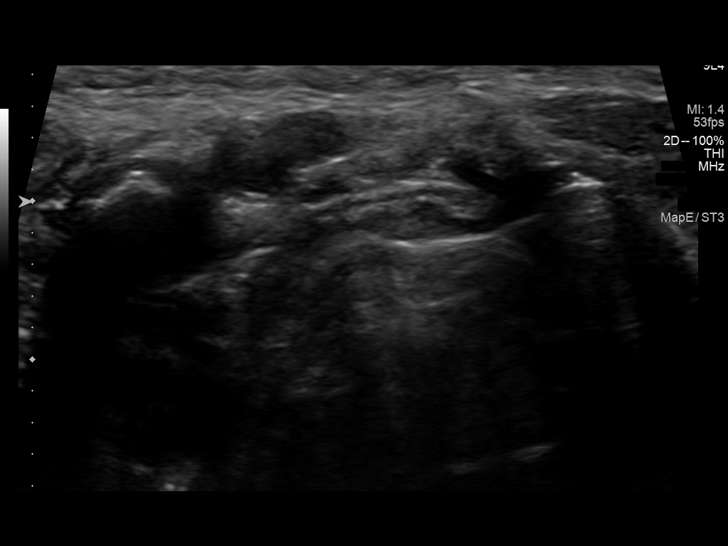
[im 52/52]
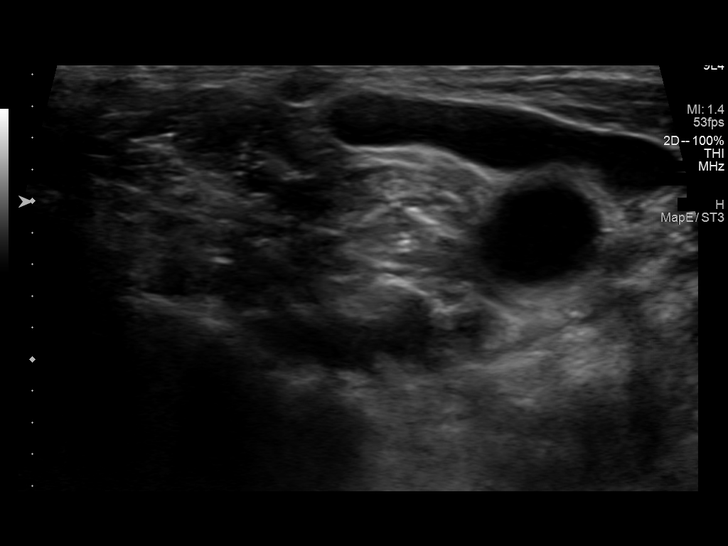

[13 of 25 positions shown; findings below may reference images not displayed]

FINDINGS: Parenchymal Echotexture: Mildly heterogeneous

Isthmus: 0.2 cm

Right lobe: 4.2 x 0.9 x 1.9 cm

Left lobe: 3.8 x 0.8 x 1.6 cm

________________________________________________________

Estimated total number of nodules >/= 1 cm: 1

Number of spongiform nodules >/=  2 cm not described below (TR1): 0

Number of mixed cystic and solid nodules >/= 1.5 cm not described
below (TR2): 0

_________________________________________________________

Nodule # 1:

Location: Left; Superior

Maximum size: 1.0 cm; Other 2 dimensions: 0.9 x 0.5 cm

Composition: cystic/almost completely cystic (0)

Echogenicity: anechoic (0)

Shape: not taller-than-wide (0)

Margins: smooth (0)

Echogenic foci: none (0)

ACR TI-RADS total points: 0.

ACR TI-RADS risk category: TR1 (0-1 points).

ACR TI-RADS recommendations:

This nodule does NOT meet TI-RADS criteria for biopsy or dedicated
follow-up.

_________________________________________________________

No cervical lymphadenopathy.
IMPRESSION: Simple cystic nodule in the left superior thyroid measuring up to
1.0 cm (TI-RADS category 1). This nodule is unlikely to be the
etiology of reported odynophagia due to small size and does not
require additional follow-up. No additional sonographic abnormality
in the visualized neck to explain reported symptoms.

The above is in keeping with the ACR TI-RADS recommendations - [HOSPITAL] 4595;[DATE].

## 2023-02-02 ENCOUNTER — Telehealth: Payer: Self-pay | Admitting: Pharmacy Technician

## 2023-02-02 ENCOUNTER — Other Ambulatory Visit: Payer: Self-pay

## 2023-02-02 NOTE — Telephone Encounter (Signed)
Auth Submission: NO AUTH NEEDED Site of care: Site of care: [facility] Payer: medicare a/b Medication & CPT/J Code(s) submitted: Reclast (Zolendronic acid) [id number] Route of submission (phone, fax, portal):  Phone # Fax # Auth type: Buy/Bill PB Units/visits requested: x1 Reference number:  Approval from: 02/02/23 to 03/28/23

## 2023-02-03 ENCOUNTER — Other Ambulatory Visit: Payer: Self-pay

## 2023-02-06 ENCOUNTER — Other Ambulatory Visit: Payer: Self-pay

## 2023-02-21 ENCOUNTER — Ambulatory Visit: Payer: Medicare Other | Admitting: *Deleted

## 2023-02-21 VITALS — BP 113/54 | HR 61 | Temp 97.5°F | Resp 16 | Ht 60.5 in | Wt 118.4 lb

## 2023-02-21 DIAGNOSIS — M810 Age-related osteoporosis without current pathological fracture: Secondary | ICD-10-CM | POA: Diagnosis not present

## 2023-02-21 MED ORDER — ZOLEDRONIC ACID 5 MG/100ML IV SOLN
5.0000 mg | Freq: Once | INTRAVENOUS | Status: AC
  Administered 2023-02-21: 5 mg via INTRAVENOUS
  Filled 2023-02-21: qty 100

## 2023-02-21 MED ORDER — SODIUM CHLORIDE 0.9 % IV SOLN: INTRAVENOUS | Status: DC

## 2023-02-21 MED ORDER — ACETAMINOPHEN 325 MG PO TABS: 650.0000 mg | ORAL_TABLET | Freq: Once | ORAL | Status: DC

## 2023-02-21 MED ORDER — DIPHENHYDRAMINE HCL 25 MG PO CAPS: 25.0000 mg | ORAL_CAPSULE | Freq: Once | ORAL | Status: DC

## 2023-02-21 NOTE — Progress Notes (Signed)
Diagnosis: Osteoporosis  Provider:  Chilton Greathouse MD  Procedure: IV Infusion  IV Type: Peripheral, IV Location: L Antecubital  Reclast (Zolendronic Acid), Dose: 5 mg  Infusion Start Time: 1121 am  Infusion Stop Time: 1155 am  Post Infusion IV Care: Observation period completed and Peripheral IV Discontinued  Discharge: Condition: Good, Destination: Home . AVS Provided  Performed by:  Forrest Moron, RN

## 2023-02-27 ENCOUNTER — Encounter: Payer: Self-pay | Admitting: Cardiology

## 2023-02-27 ENCOUNTER — Ambulatory Visit: Payer: Medicare Other | Attending: Cardiology | Admitting: Cardiology

## 2023-02-27 ENCOUNTER — Other Ambulatory Visit: Payer: Self-pay | Admitting: *Deleted

## 2023-02-27 VITALS — BP 104/78 | HR 68 | Resp 16 | Ht 60.5 in | Wt 117.0 lb

## 2023-02-27 DIAGNOSIS — I10 Essential (primary) hypertension: Secondary | ICD-10-CM | POA: Insufficient documentation

## 2023-02-27 DIAGNOSIS — I209 Angina pectoris, unspecified: Secondary | ICD-10-CM

## 2023-02-27 DIAGNOSIS — E7800 Pure hypercholesterolemia, unspecified: Secondary | ICD-10-CM | POA: Diagnosis not present

## 2023-02-27 MED ORDER — NITROGLYCERIN 0.4 MG SL SUBL
0.4000 mg | SUBLINGUAL_TABLET | SUBLINGUAL | 1 refills | Status: DC | PRN
Start: 2023-02-27 — End: 2023-03-08

## 2023-02-27 MED ORDER — METOPROLOL SUCCINATE ER 25 MG PO TB24
25.0000 mg | ORAL_TABLET | Freq: Every day | ORAL | 2 refills | Status: DC
Start: 2023-02-27 — End: 2023-05-25

## 2023-02-27 MED ORDER — ASPIRIN 81 MG PO CHEW: 81.0000 mg | CHEWABLE_TABLET | Freq: Every day | ORAL | Status: DC

## 2023-02-27 MED ORDER — SIMVASTATIN 10 MG PO TABS: 10.0000 mg | ORAL_TABLET | Freq: Every day | ORAL | 3 refills | Status: DC

## 2023-02-27 NOTE — Patient Instructions (Addendum)
Medication Instructions:  Your physician has recommended you make the following change in your medication:  Start Aspirin 81 mg by mouth daily. Start Metoprolol Succinate 25 mg by mouth daily Start Simvastatin 10 mg by mouth daily  Stop lisinopril. A prescription for Nitroglycerin has been sent to your pharmacy to use as needed  *If you need a refill on your cardiac medications before your next appointment, please call your pharmacy*   Lab Work: Lab work to be done today--Lipid profile.  Have lab work done in 2 months--week or so prior to appointment.  Lipid and BMP.  This will be fasting.  There is a [facility] on the first floor of our building If you have labs (blood work) drawn today and your tests are completely normal, you will receive your results only by: MyChart Message (if you have MyChart) OR A paper copy in the mail If you have any lab test that is abnormal or we need to change your treatment, we will call you to review the results.   Testing/Procedures: none   Follow-Up: At [facility], you and your health needs are our priority.  As part of our continuing mission to provide you with exceptional heart care, we have created designated Provider Care Teams.  These Care Teams include your primary Cardiologist (physician) and Advanced Practice Providers (APPs -  Physician Assistants and Nurse Practitioners) who all work together to provide you with the care you need, when you need it.  We recommend signing up for the patient portal called "MyChart".  Sign up information is provided on this After Visit Summary.  MyChart is used to connect with patients for Virtual Visits (Telemedicine).  Patients are able to view lab/test results, encounter notes, upcoming appointments, etc.  Non-urgent messages can be sent to your provider as well.   To learn more about what you can do with MyChart, go to [email or web address].    Your next appointment:   2 month(s)  Provider:    Jari Favre, PA-C, Ronie Spies, PA-C, Robin Searing, NP, Jacolyn Reedy, PA-C, Eligha Bridegroom, NP, Tereso Newcomer, PA-C, or Perlie Gold, PA-C         Other Instructions

## 2023-02-27 NOTE — Progress Notes (Signed)
Cardiology Office Note:  .   Date:  02/27/2023  ID:  Jody [name], DOB Jan 28, 1941, MRN [id number] PCP: No primary care provider on file.  [facility] Providers Cardiologist:  Yates Decamp, MD   History of Present Illness: Jody [name]   Jody [name] is a 82 y.o. 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 Patient had seen me about 10 years ago and in 2014 she has had a negative nuclear stress test at that time and an echocardiogram had revealed normal LV systolic function with mildly calcified aortic valve and moderate mitral annular calcification.  She is continue to remain very active and has maintained weight loss.  Discussed the use of AI scribe software for clinical note transcription with the patient, who gave verbal consent to proceed.  History of Present Illness   Miss Jody [name], a patient with a history of chest discomfort, presents with a recurring discomfort in her chest that has been ongoing since January of the previous year. The discomfort is localized in the chest area and is associated with a feeling of being unable to swallow, pain, and voice changes. The patient reports that the discomfort is triggered by overheating and overexertion, and is relieved by lying down on her left side. The patient has been managing her symptoms through lifestyle changes, including weight loss and water aerobics. The frequency of the discomfort has decreased over time, with the patient reporting no incidents in the month of November. The patient's sister, who works in the medical profession, suggested that the discomfort could be angina and recommended that she consult with the doctor.      Review of Systems  Cardiovascular:  Positive for chest pain. Negative for dyspnea on exertion and leg swelling.    Labs   Lab  Results  Component Value Date   CHOL 149 10/28/2020   HDL 63 10/28/2020   LDLCALC 67 10/28/2020   TRIG 107 10/28/2020   CHOLHDL 2.4 10/28/2020   Lab Results  Component Value Date   NA 132 (L) 11/15/2021   K 4.4 11/15/2021   CO2 25 11/15/2021   GLUCOSE 109 (H) 11/15/2021   BUN 15 11/15/2021   CREATININE 1.07 (H) 11/15/2021   CALCIUM 10.4 11/15/2021   GFR 52.74 (L) 11/05/2021   EGFR 52 (L) 11/15/2021   GFRNONAA 41 (L) 11/14/2019      Latest Ref Rng & Units 11/15/2021    3:11 PM 11/05/2021   10:27 AM 05/24/2021    2:53 PM  BMP  Glucose 65 - 99 mg/dL 409  91  811   BUN 7 - 25 mg/dL 15  25  19    Creatinine 0.60 - 0.95 mg/dL 9.14  7.82  9.56   BUN/Creat Ratio 6 - 22 (calc) 14   NOT APPLICABLE   Sodium 135 - 146 mmol/L 132  131  133   Potassium 3.5 - 5.3 mmol/L 4.4  4.2  4.6   Chloride 98 - 110 mmol/L 97  95  97   CO2 20 - 32 mmol/L 25  28  26    Calcium 8.6 - 10.4 mg/dL 21.3  9.7  08.6       Latest Ref Rng & Units 11/05/2021   10:27 AM 05/24/2021    2:53 PM 10/28/2020   10:49 AM  CBC  WBC 4.0 - 10.5 K/uL 6.2  6.1  5.6   Hemoglobin 12.0 - 15.0 g/dL 16.1  09.6  04.5   Hematocrit 36.0 - 46.0 % 32.5  33.4  33.8   Platelets 150.0 - 400.0 K/uL 292.0  295  250     Physical Exam:   VS:  BP 104/78 (BP Location: Left Arm, Patient Position: Sitting, Cuff Size: Normal)   Pulse 68   Resp 16   Ht 5' 0.5" (1.537 m)   Wt 117 lb (53.1 kg)   SpO2 94%   BMI 22.47 kg/m    Wt Readings from Last 3 Encounters:  02/27/23 117 lb (53.1 kg)  02/21/23 118 lb 6.4 oz (53.7 kg)  10/21/22 114 lb (51.7 kg)     Physical Exam Neck:     Vascular: No carotid bruit or JVD.  Cardiovascular:     Rate and Rhythm: Normal rate and regular rhythm.     Pulses: Intact distal pulses.     Heart sounds: Normal heart sounds. No murmur heard.    No gallop.  Pulmonary:     Effort: Pulmonary effort is normal.     Breath sounds: Normal breath sounds.  Abdominal:     General: Bowel sounds are normal.      Palpations: Abdomen is soft.  Musculoskeletal:     Right lower leg: No edema.     Left lower leg: No edema.     Studies Reviewed: .    NA  EKG:    EKG Interpretation Date/Time:  Monday February 27 2023 14:12:42 EST Ventricular Rate:  68 PR Interval:  150 QRS Duration:  72 QT Interval:  400 QTC Calculation: 425 R Axis:   49  Text Interpretation: Normal sinus rhythm Normal ECG When compared with ECG of 16-Mar-2007 02:14, No significant change was found Confirmed by Delrae Rend [phone]) on 02/27/2023 2:38:33 PM    Medications and allergies    Allergies  Allergen Reactions   Shrimp [Shellfish Allergy] Anaphylaxis    ALL SEAFOOD   Prolia [Denosumab] Other (See Comments)    Chest pain   Percocet [Oxycodone-Acetaminophen]     Confusion      Current Outpatient Medications:    aspirin (ASPIRIN CHILDRENS) 81 MG chewable tablet, Chew 1 tablet (81 mg total) by mouth daily., Disp: , Rfl:    Bacillus Coagulans-Inulin (ALIGN PREBIOTIC-PROBIOTIC PO), , Disp: , Rfl:    Calcium Carbonate-Vit D-Min (CALCIUM 1200 PO), Take 1 tablet by mouth daily., Disp: , Rfl:    celecoxib (CELEBREX) 100 MG capsule, Take 1 capsule (100 mg total) by mouth 2 (two) times daily as needed. (Patient taking differently: Take 100 mg by mouth daily.), Disp: 180 capsule, Rfl: 2   cholecalciferol (VITAMIN D3) 25 MCG (1000 UNIT) tablet, Take 1,000 Units by mouth daily., Disp: , Rfl:    gabapentin (NEURONTIN) 300 MG capsule, Take 300 mg by mouth 3 (three) times daily., Disp: , Rfl:    Iron 66 MG TABS, Take 1 tablet by mouth daily. , Disp: , Rfl:    metoprolol succinate (TOPROL XL) 25 MG 24 hr tablet, Take 1 tablet (25 mg total) by mouth daily., Disp: 30 tablet, Rfl: 2   Multiple Vitamin (MULTIVITAMIN) tablet, Take 1 tablet by mouth daily., Disp: , Rfl:    nitroGLYCERIN (NITROSTAT) 0.4 MG SL tablet, Place 1 tablet (0.4 mg total) under the tongue every 5 (five) minutes as needed for chest pain., Disp: 25 tablet,  Rfl: 1   simvastatin (ZOCOR) 10 MG tablet, Take 1 tablet (10 mg total) by mouth  at bedtime., Disp: 90 tablet, Rfl: 3   Zoledronic Acid (RECLAST IV), as directed. Once yearly, last infusion was November 2022, Disp: , Rfl:    ASSESSMENT AND PLAN: .      ICD-10-CM   1. Angina pectoris (HCC)  I20.9 EKG 12-Lead    aspirin (ASPIRIN CHILDRENS) 81 MG chewable tablet    metoprolol succinate (TOPROL XL) 25 MG 24 hr tablet    simvastatin (ZOCOR) 10 MG tablet    nitroGLYCERIN (NITROSTAT) 0.4 MG SL tablet    2. Primary hypertension  I10 Basic Metabolic Panel (BMET)    3. Pure hypercholesterolemia  E78.00 Lipid Profile    Lipid Profile    CANCELED: Lipid Profile     1. Angina pectoris (HCC) Patient's symptoms of right leg pain with exertion activity and relieved with rest is clearly indicated of angina pectoris.  Her symptoms are class I-II hence I did not order any testing but would like to try low-dose beta-blocker therapy along with low-dose initiation of simvastatin although her lipids were normal.  Will check lipids prior to her next office visit.  S/L NTG was prescribed and explained how to and when to use it and to notify us if there is change in frequency of use.  Patient instructed to start ASA  81mg  q daily for prophylaxis.   She will be arranged to be followed up in 2 months for follow-up of her symptoms, if she remains asymptomatic we can see her back on a as needed basis and continue present medical therapy.  However if her symptoms persist, could consider exercise nuclear stress test and then repeat follow-up and optimization of medical therapy.  - EKG 12-Lead - aspirin (ASPIRIN CHILDRENS) 81 MG chewable tablet; Chew 1 tablet (81 mg total) by mouth daily. - metoprolol succinate (TOPROL XL) 25 MG 24 hr tablet; Take 1 tablet (25 mg total) by mouth daily.  Dispense: 30 tablet; Refill: 2 - simvastatin (ZOCOR) 10 MG tablet; Take 1 tablet (10 mg total) by mouth at bedtime.  Dispense: 90  tablet; Refill: 3 - nitroGLYCERIN (NITROSTAT) 0.4 MG SL tablet; Place 1 tablet (0.4 mg total) under the tongue every 5 (five) minutes as needed for chest pain.  Dispense: 25 tablet; Refill: 1 - Basic Metabolic Panel (BMET); Future  2. Primary hypertension Patient previously was on lisinopril 5 mg daily for hypertension and although blood pressure is well-controlled in view of angina pectoris, I started her on metoprolol succinate 25 mg daily and discontinued lisinopril.  She also has history of hyponatremia which I suspect may have been contributed by lisinopril.  Will follow-up on this with repeating BMP prior to her next office visit.  - Basic Metabolic Panel (BMET); Future  3. Pure hypercholesterolemia As dictated above, will check lipids as a baseline today and again in future prior to her next office visit in 2 months. - Lipid Profile; Future - Basic Metabolic Panel (BMET); Future - Lipid Profile  Other orders - Bacillus Coagulans-Inulin (ALIGN PREBIOTIC-PROBIOTIC PO) - cholecalciferol (VITAMIN D3) 25 MCG (1000 UNIT) tablet; Take 1,000 Units by mouth daily.    Signed,  Yates Decamp, MD, [facility] 02/27/2023, 3:28 PM [facility] [address] [place], [place] [address] Phone: [phone]. Fax:  [phone]

## 2023-03-01 LAB — LIPID PANEL
Chol/HDL Ratio: 2.2 {ratio} (ref 0.0–4.4)
Cholesterol, Total: 156 mg/dL (ref 100–199)
HDL: 71 mg/dL (ref >39)
LDL Chol Calc (NIH): 69 mg/dL (ref 0–99)
Triglycerides: 88 mg/dL (ref 0–149)
VLDL Cholesterol Cal: 16 mg/dL (ref 5–40)

## 2023-03-02 ENCOUNTER — Other Ambulatory Visit (INDEPENDENT_AMBULATORY_CARE_PROVIDER_SITE_OTHER): Payer: Medicare Other

## 2023-03-02 ENCOUNTER — Encounter: Payer: Self-pay | Admitting: Sports Medicine

## 2023-03-02 ENCOUNTER — Ambulatory Visit: Payer: Medicare Other | Admitting: Sports Medicine

## 2023-03-02 DIAGNOSIS — M25511 Pain in right shoulder: Secondary | ICD-10-CM

## 2023-03-02 DIAGNOSIS — M24011 Loose body in right shoulder: Secondary | ICD-10-CM

## 2023-03-02 DIAGNOSIS — G8929 Other chronic pain: Secondary | ICD-10-CM

## 2023-03-02 DIAGNOSIS — M357 Hypermobility syndrome: Secondary | ICD-10-CM

## 2023-03-02 DIAGNOSIS — M12811 Other specific arthropathies, not elsewhere classified, right shoulder: Secondary | ICD-10-CM

## 2023-03-02 DIAGNOSIS — M19011 Primary osteoarthritis, right shoulder: Secondary | ICD-10-CM | POA: Diagnosis not present

## 2023-03-02 NOTE — Progress Notes (Signed)
Jody [name] - 82 y.o. female MRN [id number]  Date of birth: 09-28-1940  Office Visit Note: Visit Date: 03/02/2023 PCP: No primary care provider on file. Referred by: No ref. provider found  Subjective: Chief Complaint  Patient presents with   Right Shoulder - Pain   HPI: Jody [name] is a pleasant 82 y.o. female who presents today for acute on chronic right shoulder pain.  She is a very active individual.  Simrit has had right shoulder pain for several years.  She has had previous x-rays that show advanced osteoarthritis.  She has also been seeing [place] for physical therapy for many years, she has a history of hypermobility and instability.    Jody [name] tells me today she has had some stiffness and lack of range of motion in the shoulder for years, but here more recently over the last few months she has been having more pain and limitation with activities such as swimming and reaching.  She has been very adherent to physical therapy but is struggling due to the pain and this is stopping her from doing activities that she enjoys. For pain control she is on gabapentin 300 mg 3 times daily (takes this also for her cervical pain - hx of fusion) and Celebrex 100mg  every day.  She does report she had an injection in the front of her shoulder sometime 6 months or so or greater but this was not very helpful.  Pertinent ROS were reviewed with the patient and found to be negative unless otherwise specified above in HPI.   Assessment & Plan: Visit Diagnoses:  1. Chronic right shoulder pain   2. Primary osteoarthritis, right shoulder   3. Rotator cuff arthropathy of right shoulder   4. Loose body in right shoulder   5. Benign joint hypermobility    Plan: Discussed with Jody [name] the nature of her acute on chronic right shoulder pain.  I did compare her previous x-rays from 2019 and she has progressive advanced osteoarthritis of the right shoulder, new x-rays show a calcification within the joint  which is concerning for a loose body.  She has had arthritis of the shoulder for years, but has had newer pain over the last few months with reaching and provocative maneuvers regarding her rotator cuff.  I am concerned that she has concomitant rotator cuff tearing that is limiting her activity and causing her pain.  We will move forward with an MRI of the right shoulder to evaluate the intra-articular shoulder as well as the overlying rotator cuff tendons. She may continue her Celebrex 100 mg daily and gabapentin 300 mg 3 times daily for pain control in the interim.  We did discuss the role of an ultrasound-guided glenohumeral joint injection, but will hold on this for now until MRI to review next steps.  We may consider formalized physical therapy as well but she would like to hold on this until her MRI.  Follow-up: Return for f/u 1-week after MRI scan (30-min).  *will call her to schedule if not already set up  Meds & Orders: No orders of the defined types were placed in this encounter.   Orders Placed This Encounter  Procedures   XR Shoulder Right   MR SHOULDER RIGHT WO CONTRAST     Procedures: No procedures performed      Clinical History: No specialty comments available.  She reports that she quit smoking about 49 years ago. Her smoking use included cigarettes. She started smoking about 69 years ago.  She has never used smokeless tobacco. No results for input(s): "HGBA1C", "LABURIC" in the last 8760 hours.  Objective:    Physical Exam  Gen: Well-appearing, in no acute distress; non-toxic CV: Well-perfused. Warm.  Resp: Breathing unlabored on room air; no wheezing. Psych: Fluid speech in conversation; appropriate affect; normal thought process Neuro: Sensation intact throughout. No gross coordination deficits.   Ortho Exam - Right shoulder: There is a moderate effusion about the right shoulder without warmth or redness.  There is pain to palpation over the anterior joint recess.   There is limited active range of motion with forward flexion about 125 degrees, abduction 95 degrees as well as external rotation to 35 degrees before mechanical block.  I am able to take her further passively to approximately 140 degrees and 115 degrees of abduction for bony block.  There is grating through range of motion.  There is scapular and right trapezius compensation.  There is positive drop arm, empty can and some pain with mild weakness with resisted abduction.  Imaging: XR Shoulder Right  Result Date: 03/02/2023 4 views of the right shoulder including Grashey, AP, scapular Y and axial view were ordered and reviewed by myself.  X-rays demonstrate severe osteoarthritic change within the shoulder joint with notable subchondral sclerosis and joint space narrowing.  There is a small degree of calcific change in the region of the subscapularis.  There is also a large calcification that is 2 cm vertically in diameter, this suggested possible loose body within the shoulder joint.  No acute fracture noted.   Narrative & Impression  CLINICAL DATA:  pt here with c/o of right shoulder pain. Pt has a HX of severe osteoarthritis and was attempting to reach back and "catch a door" that was closing today in am.   EXAM: RIGHT SHOULDER - 2+ VIEW   COMPARISON:  03/16/2007   FINDINGS: There is no evidence of fracture or dislocation. Osteopenia. Subchondral irregularity in the humeral head. There is no other evidence of arthropathy or other focal bone abnormality. Soft tissues are unremarkable.   IMPRESSION: Chronic appearing subchondral degenerative change in the humeral head. No fracture or dislocation.     Electronically Signed   By: Corlis Leak M.D.   On: 03/24/2018 09:58    Past Medical/Family/Surgical/Social History: Medications & Allergies reviewed per EMR, new medications updated. Patient Active Problem List   Diagnosis Date Noted   Primary osteoarthritis of left knee 09/23/2021    Pure hypercholesterolemia 11/14/2019   Hyperglycemia 11/14/2019   Decreased visual acuity 11/14/2019   Other fatigue 11/14/2019   Positional lightheadedness 11/14/2019   Gastroesophageal reflux disease 02/04/2019   BMI 32.0-32.9,adult 02/12/2018   Obesity (BMI 30.0-34.9) 07/17/2017   Impingement syndrome of right shoulder 07/04/2016   Chronic pain of both knees 07/04/2016   Lumbar back pain with radiculopathy affecting left lower extremity 04/11/2016   Displaced transverse fracture of left patella, subsequent encounter for closed fracture with delayed healing 02/03/2016   Finger fracture, left 02/03/2016   Leg cramps, sleep related 11/19/2015   Cervical spondylosis without myelopathy 06/08/2015   Osteoarthritis of finger of right hand 06/08/2015   Absolute anemia 05/16/2014   Primary osteoarthritis involving multiple joints 05/16/2014   Cataract cortical, senile 05/16/2014   Essential hypertension 09/03/2012   Edema of both legs    Senile osteoporosis    B12 deficiency due to diet    Past Medical History:  Diagnosis Date   Anemia, unspecified    on meds   B12  deficiency due to diet    Basal cell carcinoma    LEFT UPPER ARM SUP. TX=CX3 5FU   Cataract    bilateral sx   Cramp of limb    Edema of both legs    Encounter for long-term (current) use of other medications    Hypertension    on meds   Lumbago    Melanoma (HCC) 10/18/2010   RIGHT POST SHOULDER =MOHS   Obesity    Osteoarthrosis, unspecified whether generalized or localized, unspecified site    generalized   Restless legs syndrome (RLS)    SCC (squamous cell carcinoma) 07/03/2017   RIGHT CHEST CX3 5FU   SCC (squamous cell carcinoma) 01/28/2019   LEFT FOREARM CX3 5FU   SCC (squamous cell carcinoma) [id number]   RIGHT FOREHEAD CX3 5FU   SCC (squamous cell carcinoma) 07/03/2017   RIGHT FOREARM CX3 5FU   SCCA (squamous cell carcinoma) of skin 04/08/2020   Right Anterior Mandible (in situ)(CX35FU)   SCCA (squamous  cell carcinoma) of skin 04/08/2020   Left Supraorbital Region (Keratoacanthoma)   Senile osteoporosis    Squamous cell carcinoma of skin 01/30/2017   RIGHT JAWLINE TX WITH BX   Family History  Problem Relation Age of Onset   Diabetes Father    Prostate cancer Father    Rectal cancer Sister 11   Colon polyps Sister    Cancer Sister    Diabetes Brother    Prostate cancer Brother    Lung cancer Brother 64   Other Brother        declined after a fall   Stomach cancer Maternal Grandmother    Pancreatic cancer Neg Hx    Colon cancer Neg Hx    Esophageal cancer Neg Hx    Past Surgical History:  Procedure Laterality Date   CATARACT EXTRACTION W/ INTRAOCULAR LENS  IMPLANT, BILATERAL  04/2014   COLONOSCOPY     EYE SURGERY Right 04/28/2016   Macular   SPINE SURGERY  2005   STOMACH SURGERY  1981   stapleing    TONSILLECTOMY     UPPER GASTROINTESTINAL ENDOSCOPY     VITRECTOMY Right    with membrane peeling for a macular pucker   Social History   Occupational History   Occupation: Retired Nurse, mental health: [employer]  Tobacco Use   Smoking status: Former    Current packs/day: 0.00    Types: Cigarettes    Start date: 1955    Quit date: 1975    Years since quitting: 49.9   Smokeless tobacco: Never   Tobacco comments:    Quit in early 30's  Vaping Use   Vaping status: Never Used  Substance and Sexual Activity   Alcohol use: No    Alcohol/week: 0.0 standard drinks of alcohol   Drug use: No   Sexual activity: Never

## 2023-03-04 ENCOUNTER — Ambulatory Visit (HOSPITAL_COMMUNITY)
Admission: RE | Admit: 2023-03-04 | Discharge: 2023-03-04 | Disposition: A | Discharge status: Home / Self Care | Payer: Medicare Other | Source: Ambulatory Visit | Attending: Sports Medicine | Admitting: Sports Medicine

## 2023-03-04 DIAGNOSIS — M19011 Primary osteoarthritis, right shoulder: Secondary | ICD-10-CM | POA: Diagnosis present

## 2023-03-04 DIAGNOSIS — M24011 Loose body in right shoulder: Secondary | ICD-10-CM | POA: Insufficient documentation

## 2023-03-04 DIAGNOSIS — G8929 Other chronic pain: Secondary | ICD-10-CM | POA: Diagnosis present

## 2023-03-04 DIAGNOSIS — M25511 Pain in right shoulder: Secondary | ICD-10-CM | POA: Insufficient documentation

## 2023-03-04 DIAGNOSIS — M12811 Other specific arthropathies, not elsewhere classified, right shoulder: Secondary | ICD-10-CM | POA: Diagnosis present

## 2023-03-06 ENCOUNTER — Other Ambulatory Visit: Payer: Self-pay | Admitting: Cardiology

## 2023-03-06 DIAGNOSIS — I209 Angina pectoris, unspecified: Secondary | ICD-10-CM

## 2023-03-28 ENCOUNTER — Other Ambulatory Visit (INDEPENDENT_AMBULATORY_CARE_PROVIDER_SITE_OTHER): Payer: Self-pay

## 2023-03-28 ENCOUNTER — Other Ambulatory Visit: Payer: Self-pay

## 2023-03-28 ENCOUNTER — Encounter: Payer: Self-pay | Admitting: Sports Medicine

## 2023-03-28 ENCOUNTER — Ambulatory Visit: Payer: Medicare Other | Admitting: Sports Medicine

## 2023-03-28 DIAGNOSIS — M25411 Effusion, right shoulder: Secondary | ICD-10-CM

## 2023-03-28 DIAGNOSIS — M75101 Unspecified rotator cuff tear or rupture of right shoulder, not specified as traumatic: Secondary | ICD-10-CM

## 2023-03-28 DIAGNOSIS — M19011 Primary osteoarthritis, right shoulder: Secondary | ICD-10-CM

## 2023-03-28 DIAGNOSIS — M7918 Myalgia, other site: Secondary | ICD-10-CM | POA: Diagnosis not present

## 2023-03-28 MED ORDER — LIDOCAINE HCL 1 % IJ SOLN
1.0000 mL | INTRAMUSCULAR | Status: AC | PRN
  Administered 2023-03-28: 1 mL

## 2023-03-28 MED ORDER — METHYLPREDNISOLONE ACETATE 40 MG/ML IJ SUSP
40.0000 mg | INTRAMUSCULAR | Status: AC | PRN
  Administered 2023-03-28: 40 mg via INTRA_ARTICULAR

## 2023-03-28 MED ORDER — BUPIVACAINE HCL 0.25 % IJ SOLN
1.0000 mL | INTRAMUSCULAR | Status: AC | PRN
  Administered 2023-03-28: 1 mL via INTRA_ARTICULAR

## 2023-03-28 NOTE — Progress Notes (Signed)
 Jody [name] - 82 y.o. female MRN [id number]  Date of birth: October 04, 1940  Office Visit Note: Visit Date: 03/28/2023 PCP: [facility] Referred by: [facility]  Subjective: Chief Complaint  Patient presents with   Right Shoulder - Follow-up, Pain   HPI: Jody [name] is a pleasant 82 y.o. female who presents today for follow-up of MRI review and chronic right shoulder pain; also with newer left-sided hip/posterior buttock pain.  Right shoulder - continues to give her trouble, she has been not using the right arm when she is doing walking and her aquatic therapy in the pool.  There is noticed swelling on the shoulder, this waxes and wanes.  Did have her MRI which showed severe osteoarthritis but also full-thickness supraspinatus and rotator cuff arthropathy.  Left posterior hip/buttock -here over the last month she has had pain over the posterior hip/buttock.  She feels like something is rubbing over on this.  She has had a few instances where this plays out and will cause her pain, specifically when she is in the pool and uses the other leg to lift/extend the leg/hamstring.  For pain control she is on gabapentin  300 mg 3 times daily (takes this also for her cervical pain - hx of fusion) and Celebrex  100mg  every day.   Pertinent ROS were reviewed with the patient and found to be negative unless otherwise specified above in HPI.   Assessment & Plan: Visit Diagnoses:  1. Tear of right supraspinatus tendon   2. Primary osteoarthritis, right shoulder   3. Effusion of right shoulder joint   4. Pain in left buttock    Plan: Impression is chronic right shoulder pain which has both MRI confirmed severe osteoarthritis as well as global rotator cuff arthropathy with a complete full-thickness supraspinatus tear with retraction.  Discussed with Azalynn these were likely chronic developing tears, but I think her effusion is more so limiting her activity and range of  motion.  We did proceed with ultrasound-guided aspiration and subsequent injection into the glenohumeral joint today, patient tolerated well.  I would like to bring her back in about 2 weeks for reevaluation and we will be ultrasound the shoulder, may need to consider subacromial joint aspiration at that time depending on her fluid reaccumulation.  Advised on 48 hours of modified rest/activity modification.  She may use Tylenol , ice/heat for any postinjection pain. She may continue her chronic Celebrex  100 mg daily and gabapentin  300 mg 3 times daily as needed for pain control.  She also mentioned having pain of the posterior hip/buttock region, this seems most indicative of proximal hamstring pathology versus ischial tuberosity bursitis.  X-rays of the hip show very mild arthritic change which I do not think is contributing to her pain.  We will address this treatment at next visit.  Follow-up: Return in about 2 weeks (around 04/11/2023) for for shoulder ultrasound; L-hip/buttock f/u (30-min).   Meds & Orders: No orders of the defined types were placed in this encounter.   Orders Placed This Encounter  Procedures   US  Guided Needle Placement - No Linked Charges     Procedures: Large Joint Inj: R glenohumeral on 03/28/2023 10:00 AM Indications: pain Details: 22 G 3.5 in needle, ultrasound-guided posterior approach Medications: 1 mL lidocaine  1 %; 1 mL bupivacaine  0.25 %; 40 mg methylPREDNISolone  acetate 40 MG/ML Outcome: tolerated well, no immediate complications  US -guided glenohumeral joint injection, right shoulder After discussion on risks/benefits/indications, informed verbal consent was obtained. A  timeout was then performed. The patient was positioned lying lateral recumbent on examination table. The patient's shoulder was prepped with betadine and multiple alcohol swabs and utilizing ultrasound guidance, the patient's glenohumeral joint was identified on ultrasound. Using ultrasound  guidance a 22-gauge, 1.5 inch needle with a mixture of 1:1:1 cc's lidocaine :bupivicaine:depomedrol was directed from a lateral to medial direction via in-plane technique into the glenohumeral joint with visualization of appropriate spread of injectate into the joint. Patient tolerated the procedure well without immediate complications.      Procedure, treatment alternatives, risks and benefits explained, specific risks discussed. Consent was given by the patient. Immediately prior to procedure a time out was called to verify the correct patient, procedure, equipment, support staff and site/side marked as required. Patient was prepped and draped in the usual sterile fashion.         Clinical History: No specialty comments available.  She reports that she quit smoking about 50 years ago. Her smoking use included cigarettes. She started smoking about 70 years ago. She has never used smokeless tobacco. No results for input(s): HGBA1C, LABURIC in the last 8760 hours.  Objective:    Physical Exam  Gen: Well-appearing, in no acute distress; non-toxic CV: Well-perfused. Warm.  Resp: Breathing unlabored on room air; no wheezing. Psych: Fluid speech in conversation; appropriate affect; normal thought process  Ortho Exam - Right shoulder: Right shoulder has a moderate to large effusion more so in the subacromial region also extends into the posterior glenohumeral joint.  Range of motion is restricted both actively and passively.  - Left buttock: There is a mild TTP of the proximal hamstring insertion overlying the ischial tuberosity and just medial to this.  Pain is reproduced with stretching of the hamstring with SLR.  No tenderness to palpation over the greater trochanter.  Imaging:  MR SHOULDER RIGHT WO CONTRAST CLINICAL DATA:  Chronic right shoulder pain  EXAM: MRI OF THE RIGHT SHOULDER WITHOUT CONTRAST  TECHNIQUE: Multiplanar, multisequence MR imaging of the shoulder was  performed. No intravenous contrast was administered.  COMPARISON:  None Available.  FINDINGS: Rotator cuff: Complete full-thickness, full width tear of the supraspinatus tendon with 3.4 cm of retraction moderate infraspinatus tendinosis with a full-thickness tear of the anterior half of the tendon. Teres minor tendon is intact. Moderate subscapularis tendinosis.  Muscles: Severe atrophy of the subscapularis muscle. Remainder the rotator cuff muscles demonstrate no focal atrophy.  Biceps Long Head: Complete tear of the long head of the biceps tendon.  Acromioclavicular Joint: Moderate arthropathy of the acromioclavicular joint. Large amount of subacromial/subdeltoid bursal fluid.  Glenohumeral Joint: Large glenohumeral joint effusion with synovitis. Extensive full-thickness cartilage loss of the glenohumeral joint and subchondral marrow edema in the glenoid. Remodeling of the anterior inferior glenoid with overall loss of bone stock. No chondral defect.  Labrum: Diffuse labral degeneration.  Bones: No fracture or dislocation. No aggressive osseous lesion.  Other: No fluid collection or hematoma.  IMPRESSION: 1. Complete full-thickness, full width tear of the supraspinatus tendon with 3.4 cm of retraction. 2. Moderate infraspinatus tendinosis with a full-thickness tear of the anterior half of the tendon. 3. Moderate subscapularis tendinosis. Severe atrophy of the subscapularis muscle. 4. Complete tear of the long head of the biceps tendon. 5. Severe osteoarthritis of the right glenohumeral joint.  Electronically Signed   By: Julaine Blanch M.D.   On: 03/11/2023 08:44  Past Medical/Family/Surgical/Social History: Medications & Allergies reviewed per EMR, new medications updated. Patient Active Problem List   Diagnosis  Date Noted   Primary osteoarthritis of left knee 09/23/2021   Pure hypercholesterolemia 11/14/2019   Hyperglycemia 11/14/2019   Decreased visual acuity  11/14/2019   Other fatigue 11/14/2019   Positional lightheadedness 11/14/2019   Gastroesophageal reflux disease 02/04/2019   BMI 32.0-32.9,adult 02/12/2018   Obesity (BMI 30.0-34.9) 07/17/2017   Impingement syndrome of right shoulder 07/04/2016   Chronic pain of both knees 07/04/2016   Lumbar back pain with radiculopathy affecting left lower extremity 04/11/2016   Displaced transverse fracture of left patella, subsequent encounter for closed fracture with delayed healing 02/03/2016   Finger fracture, left 02/03/2016   Leg cramps, sleep related 11/19/2015   Cervical spondylosis without myelopathy 06/08/2015   Osteoarthritis of finger of right hand 06/08/2015   Absolute anemia 05/16/2014   Primary osteoarthritis involving multiple joints 05/16/2014   Cataract cortical, senile 05/16/2014   Essential hypertension 09/03/2012   Edema of both legs    Senile osteoporosis    B12 deficiency due to diet    Past Medical History:  Diagnosis Date   Anemia, unspecified    on meds   B12 deficiency due to diet    Basal cell carcinoma    LEFT UPPER ARM SUP. TX=CX3 5FU   Cataract    bilateral sx   Cramp of limb    Edema of both legs    Encounter for long-term (current) use of other medications    Hypertension    on meds   Lumbago    Melanoma (HCC) 10/18/2010   RIGHT POST SHOULDER =MOHS   Obesity    Osteoarthrosis, unspecified whether generalized or localized, unspecified site    generalized   Restless legs syndrome (RLS)    SCC (squamous cell carcinoma) 07/03/2017   RIGHT CHEST CX3 5FU   SCC (squamous cell carcinoma) 01/28/2019   LEFT FOREARM CX3 5FU   SCC (squamous cell carcinoma) [id number]   RIGHT FOREHEAD CX3 5FU   SCC (squamous cell carcinoma) 07/03/2017   RIGHT FOREARM CX3 5FU   SCCA (squamous cell carcinoma) of skin 04/08/2020   Right Anterior Mandible (in situ)(CX35FU)   SCCA (squamous cell carcinoma) of skin 04/08/2020   Left Supraorbital Region (Keratoacanthoma)   Senile  osteoporosis    Squamous cell carcinoma of skin 01/30/2017   RIGHT JAWLINE TX WITH BX   Family History  Problem Relation Age of Onset   Diabetes Father    Prostate cancer Father    Rectal cancer Sister 33   Colon polyps Sister    Cancer Sister    Diabetes Brother    Prostate cancer Brother    Lung cancer Brother 14   Other Brother        declined after a fall   Stomach cancer Maternal Grandmother    Pancreatic cancer Neg Hx    Colon cancer Neg Hx    Esophageal cancer Neg Hx    Past Surgical History:  Procedure Laterality Date   CATARACT EXTRACTION W/ INTRAOCULAR LENS  IMPLANT, BILATERAL  04/2014   COLONOSCOPY     EYE SURGERY Right 04/28/2016   Macular   SPINE SURGERY  2005   STOMACH SURGERY  1981   stapleing    TONSILLECTOMY     UPPER GASTROINTESTINAL ENDOSCOPY     VITRECTOMY Right    with membrane peeling for a macular pucker   Social History   Occupational History   Occupation: Retired Nurse, Mental Health: [employer]  Tobacco Use   Smoking status: Former  Current packs/day: 0.00    Types: Cigarettes    Start date: 58    Quit date: 36    Years since quitting: 50.0   Smokeless tobacco: Never   Tobacco comments:    Quit in early 30's  Vaping Use   Vaping status: Never Used  Substance and Sexual Activity   Alcohol use: No    Alcohol/week: 0.0 standard drinks of alcohol   Drug use: No   Sexual activity: Never

## 2023-03-28 NOTE — Progress Notes (Signed)
 Patient says that she has also had left hip pain. She points to ischial tuberosity when describing her pain and says it feels like something is rolling/snapping over the bone. She says when it flares up her pain is so severe she cannot put weight on it and full knee extension is painful.

## 2023-04-03 NOTE — Progress Notes (Signed)
 I saw Jody [name] in neurology clinic on 04/12/23 in follow up for numbness and tingling in feet.  HPI: Jody [name] is a 83 y.o. year old female with a history of HTN, CKD3, RLS, OA, insomnia, stomach stapled (1980) who we last saw on 10/21/22.  To briefly review: 10/21/22: Patient first noticed symptoms in 2017. She fell and broke her patella. While she was going to ortho, she mentioned numbness, tingling, burning in feet. She felt like she didn't know if she had socks on. She does not think her symptoms have worsened, but she feels less tolerant of the symptoms. After she fell in 2017, she started using a cane because she didn't want to fall again. She does not use it in the house. She has osteoporosis and knows she can break a bone easily. She does endorse imbalance. She occasionally stumbles and will fall, but usually related to the cat getting under her feet. She has fallen maybe 3 times in the last year.   [facility]. She had back surgery in 2005 (lumbar spine) and mentions the spine above and below is "mush". She does not want to have another surgery. She takes gabapentin . She is prescribed 300 mg TID but usually only takes in the morning and night. She takes Celebrex  as well. She thinks both are working for her back. She does not think the gabapentin  is helping with the neuropathy as much. She has tried Lyrica and Nortriptyline (for sleep), but these did not work.   She endorses leg cramps. She endorses some difficulty with controlling urine as she has gotten older. Her bowel function fluctuates from constipation to diarrhea on an unpredictable pattern. She does not take anything for this.   She does not report any constitutional symptoms like fever, night sweats, anorexia or unintentional weight loss. She has lost about 60 lbs on purpose since 2022 because she felt she was carrying too much weight. Her weight has been stable for the last year.    EtOH use: None  Restrictive diet? No, but has "terrible eating habits". She is "addicted to carbs and sugar." Family history of neuropathy/myopathy/neurologic disease? Dad and brother had neuropathy   EMG performed by me on 08/29/22 was consistent with a length dependent sensorimotor neuropathy (axonal).  Most recent Assessment and Plan (10/21/22): Jody [name] is a 83 y.o. female who presents for evaluation of numbness, tingling, and burning in feet. She has a relevant medical history of HTN, CKD3, RLS, OA, insomnia, stomach stapled (1980). Her neurological examination is pertinent for diminished vibratory sensation in bilateral lower extremities. Available diagnostic data is significant for normal HbA1c, B12, and IFE. MRI lumbar spine showed multilevel foraminal stenosis, severe at right L4-5, and moderate to severe at left L5-S1. EMG was most consistent with a moderate axonal sensorimotor neuropathy, though some changes seen could be related to age. 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 I will look for other treatable causes as below. Her symptoms are worse at night, so I will recommend subtle changes to gabapentin  as well.   PLAN: -Blood work: B1, B6 -Change gabapentin  to 300 mg in the morning, 600 mg at night -Lidocaine  cream PRN  -Alpha lipoic acid 600 mg once or twice daily  Since their last visit: B1 and B6 were both elevated. She was taking a B complex which  she stopped after these lab results.  Patient still has a lot of arthritis, but seeing sports medicine who is really helping with this. Her tingling, numbness, or burning is about the same as prior. She has had a lot of issues with her left hip and leg. She will have flares, but again is seeing sports medicine for this.  She is taking taking gabapentin  300 mg in the morning and 600  mg at night. This is helping with the pain. She is happy with where this is currently.   MEDICATIONS:  Outpatient Encounter Medications as of 04/12/2023  Medication Sig   Bacillus Coagulans-Inulin (ALIGN PREBIOTIC-PROBIOTIC PO)    Calcium Carbonate-Vit D-Min (CALCIUM 1200 PO) Take 1 tablet by mouth daily.   celecoxib  (CELEBREX ) 100 MG capsule Take 1 capsule (100 mg total) by mouth 2 (two) times daily as needed. (Patient taking differently: Take 100 mg by mouth daily.)   gabapentin  (NEURONTIN ) 300 MG capsule Take 300 mg by mouth 3 (three) times daily.   Iron 66 MG TABS Take 1 tablet by mouth daily.    metoprolol  succinate (TOPROL  XL) 25 MG 24 hr tablet Take 1 tablet (25 mg total) by mouth daily.   Multiple Vitamin (MULTIVITAMIN) tablet Take 1 tablet by mouth daily.   nitroGLYCERIN  (NITROSTAT ) 0.4 MG SL tablet DISSOLVE 1 TABLET UNDER THE TONGUE EVERY 5 MINUTES AS NEEDED FOR CHEST PAIN   simvastatin  (ZOCOR ) 10 MG tablet Take 1 tablet (10 mg total) by mouth at bedtime.   Zoledronic  Acid (RECLAST  IV) as directed. Once yearly, last infusion was November 2022   aspirin  (ASPIRIN  CHILDRENS) 81 MG chewable tablet Chew 1 tablet (81 mg total) by mouth daily. (Patient not taking: Reported on 04/12/2023)   cholecalciferol (VITAMIN D3) 25 MCG (1000 UNIT) tablet Take 1,000 Units by mouth daily. (Patient not taking: Reported on 04/12/2023)   No facility-administered encounter medications on file as of 04/12/2023.    PAST MEDICAL HISTORY: Past Medical History:  Diagnosis Date   Anemia, unspecified    on meds   B12 deficiency due to diet    Basal cell carcinoma    LEFT UPPER ARM SUP. TX=CX3 5FU   Cataract    bilateral sx   Cramp of limb    Edema of both legs    Encounter for long-term (current) use of other medications    Hypertension    on meds   Lumbago    Melanoma (HCC) 10/18/2010   RIGHT POST SHOULDER =MOHS   Obesity    Osteoarthrosis, unspecified whether generalized or localized, unspecified  site    generalized   Restless legs syndrome (RLS)    SCC (squamous cell carcinoma) 07/03/2017   RIGHT CHEST CX3 5FU   SCC (squamous cell carcinoma) 01/28/2019   LEFT FOREARM CX3 5FU   SCC (squamous cell carcinoma) [id number]   RIGHT FOREHEAD CX3 5FU   SCC (squamous cell carcinoma) 07/03/2017   RIGHT FOREARM CX3 5FU   SCCA (squamous cell carcinoma) of skin 04/08/2020   Right Anterior Mandible (in situ)(CX35FU)   SCCA (squamous cell carcinoma) of skin 04/08/2020   Left Supraorbital Region (Keratoacanthoma)   Senile osteoporosis    Squamous cell carcinoma of skin 01/30/2017   RIGHT JAWLINE TX WITH BX    PAST SURGICAL HISTORY: Past Surgical History:  Procedure Laterality Date   CATARACT EXTRACTION W/ INTRAOCULAR LENS  IMPLANT, BILATERAL  04/2014   COLONOSCOPY     EYE SURGERY Right 04/28/2016   Macular   SPINE SURGERY  2005  STOMACH SURGERY  1981   stapleing    TONSILLECTOMY     UPPER GASTROINTESTINAL ENDOSCOPY     VITRECTOMY Right    with membrane peeling for a macular pucker    ALLERGIES: Allergies  Allergen Reactions   Shrimp [Shellfish Allergy] Anaphylaxis    ALL SEAFOOD   Prolia  [Denosumab ] Other (See Comments)    Chest pain   Percocet [Oxycodone -Acetaminophen ]     Confusion     FAMILY HISTORY: Family History  Problem Relation Age of Onset   Diabetes Father    Prostate cancer Father    Rectal cancer Sister 72   Colon polyps Sister    Cancer Sister    Diabetes Brother    Prostate cancer Brother    Lung cancer Brother 7   Other Brother        declined after a fall   Stomach cancer Maternal Grandmother    Pancreatic cancer Neg Hx    Colon cancer Neg Hx    Esophageal cancer Neg Hx     SOCIAL HISTORY: Social History   Tobacco Use   Smoking status: Former    Current packs/day: 0.00    Types: Cigarettes    Start date: 1955    Quit date: 1975    Years since quitting: 50.0   Smokeless tobacco: Never   Tobacco comments:    Quit in early 30's   Vaping Use   Vaping status: Never Used  Substance Use Topics   Alcohol use: No    Alcohol/week: 0.0 standard drinks of alcohol   Drug use: No   Social History   Social History Narrative   Married -Donald married 1966   Former Smoker stopped 1977   Alcohol none   Exercise -Deep water 4 times a week for one hour   POA   Are you right handed or left handed? right   Are you currently employed ?    What is your current occupation?   Do you live at home alone?   Who lives with you   What type of home do you live in: 1 story or 2 story? one        Objective:  Vital Signs:  BP (!) 140/71 (Cuff Size: Normal)   Pulse 76   Ht 5' 0.5" (1.537 m)   Wt 121 lb (54.9 kg)   SpO2 98%   BMI 23.24 kg/m   General: General appearance: Awake and alert. No distress. Cooperative with exam.  Skin: No obvious rash or jaundice. HEENT: Atraumatic. Anicteric. Lungs: Non-labored breathing on room air  Extremities: Arthritic changes in all extremities.  Neurological: Mental Status: Alert. Speech fluent. No pseudobulbar affect Cranial Nerves: CNII: No RAPD. Visual fields intact. CNIII, IV, VI: PERRL. No nystagmus. EOMI. CN V: Facial sensation intact bilaterally to fine touch. CN VII: Facial muscles symmetric and strong. No ptosis at rest. CN VIII: Hears finger rub well bilaterally. CN IX: No hypophonia. CN X: Palate elevates symmetrically. CN XI: Full strength shoulder shrug bilaterally. CN XII: Tongue protrusion full and midline. No atrophy or fasciculations. No significant dysarthria Motor: Tone is normal. Strength is essentially 5/5 in bilateral upper and lower extremities with some testing limited by arthritic pain. Reflexes:  Right Left  Bicep 2+ 2+  Tricep 2+ 2+  BrRad 2+ 2+  Knee 0 0  Ankle 0 0   Sensation: Pinprick: Intact in all extremities. Vibration: Diminished in lower extremities in length dependent fashion. Coordination: Intact finger-to- nose-finger bilaterally.  Romberg negative. Gait:  Normal, narrow-based gait with aide of walking stick.   Lab and Test Review: New results: Lipid panel (02/28/23): tChol 156, LDL 69, TG 88  10/21/22: B1: 209 (high) B6: 75.7 (high)  MRI right shoulder wo contrast (03/04/23): IMPRESSION: 1. Complete full-thickness, full width tear of the supraspinatus tendon with 3.4 cm of retraction. 2. Moderate infraspinatus tendinosis with a full-thickness tear of the anterior half of the tendon. 3. Moderate subscapularis tendinosis. Severe atrophy of the subscapularis muscle. 4. Complete tear of the long head of the biceps tendon. 5. Severe osteoarthritis of the right glenohumeral joint.  Previously reviewed results: Folate (11/05/21): wnl   External labs: 08/16/22: Ferritin: 211 HbA1c: 5.7 CMP unremarkable Vit D wnl IFE: no M protein TSH: wnl (4.36) CBC unremarkable B12: 484   Imaging: MRI cervical spine wo contrast (06/19/20): FINDINGS: Alignment: Grade 1 retrolisthesis at C3-4 and C5-6   Vertebrae: No fracture, evidence of discitis, or bone lesion.   Cord: Normal signal and morphology.   Posterior Fossa, vertebral arteries, paraspinal tissues: Negative.   Disc levels:   C1-2: Unremarkable.   C2-3: Normal disc space and facet joints. There is no spinal canal stenosis. No neural foraminal stenosis.   C3-4: Small disc bulge. There is no spinal canal stenosis. Moderate left neural foraminal stenosis.   C4-5: Disc space narrowing with fusion across the posterior aspect of the disc space. There is no spinal canal stenosis. No neural foraminal stenosis.   C5-6: Disc space narrowing with small bulge and uncovertebral hypertrophy. There is no spinal canal stenosis. Moderate right neural foraminal stenosis.   C6-7: Normal disc space and facet joints. There is no spinal canal stenosis. No neural foraminal stenosis.   C7-T1: Normal disc space and facet joints. There is no spinal canal stenosis. No  neural foraminal stenosis.   IMPRESSION: 1. Moderate left C3-4 and right C5-6 neural foraminal stenosis. 2. No spinal canal stenosis.   MRI lumbar spine wo contrast (04/12/16): FINDINGS: SEGMENTATION: For the purposes of this report, the last well-formed intervertebral disc will be described as L5-S1.   ALIGNMENT: Maintenance of the lumbar lordosis. Grade 1 increased L4-5 anterolisthesis (5 mm, previously 2 mm). Lower lumbar levoscoliosis inferred on axial sequences.   VERTEBRAE:Vertebral bodies are intact. Status post L3-4 PLIF and posterior decompression. Severe L2-3 disc height loss and proportional chronic discogenic endplate changes. Moderate L4-5 and L5-S1 disc height loss, desiccation and vacuum disc and acute on chronic discogenic endplate change. Severe T11-12 degenerative disc and moderate acute on chronic discogenic endplate changes. Small T12-L1   CONUS MEDULLARIS: Conus medullaris terminates at L1-2 and demonstrates normal morphology and signal characteristics. Cauda equina is normal.   PARASPINAL AND SOFT TISSUES: Included prevertebral and paraspinal soft tissues are nonacute. Partially imaged sigmoid diverticulosis.   DISC LEVELS:   T12-L1: Small broad-based disc bulge and small central disc protrusion without canal stenosis or neural foraminal narrowing.   L1-2: Annular bulging. No canal stenosis or neural foraminal narrowing.   L2-3: Moderate broad-based disc bulge, endplate spurring and mild facet arthropathy with ligamentum flavum redundancy. Mild to moderate canal stenosis and trefoil configuration. Minimal suspected bilateral neural foraminal narrowing may be overestimated by hardware artifact.   L3-4: PLIF and posterior instrumentation without canal stenosis or neural foraminal narrowing.   L4-5: Anterolisthesis. Moderate broad-based disc bulge. Severe facet arthropathy and ligamentum flavum redundancy with trace facet effusions which are likely  reactive. Moderate canal stenosis. Severe RIGHT, mild LEFT neural foraminal narrowing.   L5-S1: 11 x 11 mm RIGHT  central disc extrusion with 12 mm of contiguous cephalad migration displaces the traversing RIGHT L5 nerve. Severe facet arthropathy and ligamentum flavum redundancy with trace facet effusions which are likely reactive. 4 mm synovial cyst migrated into the dorsal epidural space. 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 Mild canal stenosis and encroachment upon the traversing S1 nerve within the lateral recess. Moderate to severe LEFT neural foraminal narrowing.   IMPRESSION: Large L5-S1 disc extrusion displaces the traversing RIGHT L5 nerve and encroaches upon the traversing RIGHT S1 nerve.   L3-4 PLIF and posterior decompression. 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   Multilevel neural foraminal narrowing: Severe on the RIGHT at L4-5, moderate to severe on the LEFT at L5-S1.   EMG (08/29/22): NCV & EMG Findings: Extensive electrodiagnostic evaluation of the right and left lower limbs shows: Bilateral sural and superficial peroneal/fibular sensory responses are absent. Right peroneal/fibular (EDB) motor response is absent. Left peroneal/fibular (EDB) motor response shows reduced amplitude (1.23 mV). Bilateral tibial (AH) motor responses show reduced amplitude (L2.8, R3.6 mV). Bilateral peroneal/fibular (TA) motor responses are within normal limits. Bilateral H reflex is absent. Chronic motor axon loss changes without accompanying active denervation changes are seen in bilateral tibialis anterior and bilateral medial head of gastrocnemius muscles.   Impression: This is an abnormal study. The findings are most consistent with the following: Evidence of a distal symmetric large  fiber sensorimotor neuropathy. The severity is difficult to grade as some of changes can be related to age, but likely at least moderate in degree electrically. No definite electrodiagnostic evidence of a right or left lumbosacral (L3-S1) motor radiculopathy.  ASSESSMENT: This is Jody [name], a 83 y.o. female with burning, tingling, and numbness in her feet. 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  Plan: -Continue gabapentin  300 mg in the morning and 600 mg at night -Lidocaine  cream as needed -Alpha lipoic acid 600 mg once or twice daily -Follow up with sports medicine as planned for arthritis -Fall precautions discussed  Return to clinic in 1 year or sooner if needed  Rommie Coats, MD

## 2023-04-11 ENCOUNTER — Encounter: Payer: Self-pay | Admitting: Sports Medicine

## 2023-04-11 ENCOUNTER — Ambulatory Visit (INDEPENDENT_AMBULATORY_CARE_PROVIDER_SITE_OTHER): Payer: Medicare Other | Admitting: Sports Medicine

## 2023-04-11 ENCOUNTER — Other Ambulatory Visit: Payer: Self-pay

## 2023-04-11 DIAGNOSIS — M25411 Effusion, right shoulder: Secondary | ICD-10-CM | POA: Diagnosis not present

## 2023-04-11 DIAGNOSIS — M7918 Myalgia, other site: Secondary | ICD-10-CM

## 2023-04-11 DIAGNOSIS — M25511 Pain in right shoulder: Secondary | ICD-10-CM

## 2023-04-11 DIAGNOSIS — M19011 Primary osteoarthritis, right shoulder: Secondary | ICD-10-CM | POA: Diagnosis not present

## 2023-04-11 DIAGNOSIS — M7551 Bursitis of right shoulder: Secondary | ICD-10-CM

## 2023-04-11 DIAGNOSIS — G8929 Other chronic pain: Secondary | ICD-10-CM

## 2023-04-11 DIAGNOSIS — M76899 Other specified enthesopathies of unspecified lower limb, excluding foot: Secondary | ICD-10-CM

## 2023-04-11 MED ORDER — BUPIVACAINE HCL 0.25 % IJ SOLN
1.0000 mL | INTRAMUSCULAR | Status: AC | PRN
  Administered 2023-04-11: 1 mL via INTRA_ARTICULAR

## 2023-04-11 MED ORDER — LIDOCAINE HCL 1 % IJ SOLN
1.0000 mL | INTRAMUSCULAR | Status: AC | PRN
  Administered 2023-04-11: 1 mL

## 2023-04-11 MED ORDER — METHYLPREDNISOLONE ACETATE 40 MG/ML IJ SUSP
40.0000 mg | INTRAMUSCULAR | Status: AC | PRN
  Administered 2023-04-11: 40 mg via INTRA_ARTICULAR

## 2023-04-11 NOTE — Progress Notes (Signed)
 Patient says that her shoulder is feeling quite a bit better after some of the fluid was pulled from it. She says that she is now able to reach across her body and reach up a little bit more than before. She still avoids using it when she can use her left arm instead.  Patient says that the left hip is still bothering her. She has not had any more episodes where she could not bear weight on that side, but she does have pain especially when going from standing to seated.

## 2023-04-11 NOTE — Progress Notes (Signed)
 Jody [name] - 83 y.o. female MRN [id number]  Date of birth: 1940/08/08  Office Visit Note: Visit Date: 04/11/2023 [facility]: [facility] Referred by: [facility]  Subjective: Chief Complaint  Patient presents with   Right Shoulder - Follow-up, Pain   Left Hip - Follow-up   HPI: Jody [name] is a pleasant 83 y.o. female who presents today for follow-up of right shoulder OA and effusion, left posterior hip/hamstring pain.  Right shoulder -her shoulder is feeling quite a bit better after we did aspirate some fluid and inject the glenohumeral joint.  She is now able to reach across her body, she still has been cautious with certain movements.  Left posterior hip/buttock -she continues with some of the rubbing/catching sensation in her pain is slightly worse.  She does have pain more so when going from sitting to standing and vice versa.  For pain control she is on gabapentin  300 mg 3 times daily (takes this also for her cervical pain - hx of fusion) and Celebrex  100mg  every day.   Pertinent ROS were reviewed with the patient and found to be negative unless otherwise specified above in HPI.   Assessment & Plan: Visit Diagnoses:  1. Chronic right shoulder pain   2. Effusion of joint of right shoulder   3. Subacromial bursitis of right shoulder joint   4. Primary osteoarthritis, right shoulder   5. Pain in left buttock   6. Hamstring tendinitis at origin    Plan: Impression is chronic right shoulder pain with imaging-confirmed severe osteoarthritis as well as a complete full-thickness supraspinatus tear and rotator cuff arthropathy. She did get relief of symptoms with ultrasound-guided glenohumeral joint aspiration and injection.  We did ultrasound the shoulder today which showed improvement in the glenohumeral joint but a moderate to large subacromial joint bursitis and effusion. Through shared decision-making, we did proceed with ultrasound-guided  aspiration and subsequent injection, Karelly felt much better and had marked improvement of motion following the 28 cc of fluid removed.  I would like her to use the arm but hold from dedicated PT and UE physical activity for the next 2 weeks to let this settle down fully.  She will continue her Celebrex  100 mg daily, may continue her gabapentin  as well for her chronic neck pain.  She may use ice for the shoulder to help prevent reaccumulation of fluid if desires.  We reviewed her hip x-rays which show no bony abnormality of the ischial bursa, but I believe her pain is more so emanating from the proximal hamstring.  It is possible she has a degree of tearing or marked tendinosis here.  We will bring her back and plan for a few treatments of extracorporeal shockwave therapy and some dedicated HEP for this.  Could consider advanced imaging such as MRI to better quantify if not improving.   Follow-up: Return in about 1 week (around 04/18/2023) for for L-IT/hamstring (reg visit).   Meds & Orders: No orders of the defined types were placed in this encounter.   Orders Placed This Encounter  Procedures   Large Joint Inj: R subacromial bursa   US  Extrem Up Right Ltd     Procedures: Large Joint Inj: R subacromial bursa on 04/11/2023 11:13 AM Indications: pain and joint swelling Details: 18 G 3.5 in and 1.5 in needle, ultrasound-guided anterolateral approach Medications: 1 mL lidocaine  1 %; 1 mL bupivacaine  0.25 %; 40 mg methylPREDNISolone  acetate 40 MG/ML Aspirate: 28 mL clear  and yellow Outcome: tolerated well, no immediate complications  *Technically successful US -guided R-SAJ aspiration and subsequent injection. Patient tolerated well. Procedure, treatment alternatives, risks and benefits explained, specific risks discussed. Consent was given by the patient. Immediately prior to procedure a time out was called to verify the correct patient, procedure, equipment, support staff and site/side marked as  required. Patient was prepped and draped in the usual sterile fashion.          Clinical History: No specialty comments available.  She reports that she quit smoking about 50 years ago. Her smoking use included cigarettes. She started smoking about 70 years ago. She has never used smokeless tobacco. No results for input(s): HGBA1C, LABURIC in the last 8760 hours.  Objective:    Physical Exam  Gen: Well-appearing, in no acute distress; non-toxic CV: Well-perfused. Warm.  Resp: Breathing unlabored on room air; no wheezing. Psych: Fluid speech in conversation; appropriate affect; normal thought process  Ortho Exam - Right shoulder: There is improved range of motion although some restriction with endrange flexion and external/internal rotation.  There is a moderate effusion more so over the anterior shoulder and subacromial region today.  - Left buttock/hip: + TTP over the proximal hamstring and right at the origin of the ischial bursa.  There does appear to be a tendon/fascial restriction/nodule here on the left compared to a normal palpation on the right.  Imaging:  US  Extrem Up Right Ltd Limited musculoskeletal ultrasound of the right shoulder was performed  today.  There is a moderate to large subacromial joint effusion with  notable hyperemia indicative of bursitis.  There appears to be a  full-thickness tear of the supraspinatus with retraction.  Biceps tendon  is seen but with mild subluxation given her subacromial joint effusion.  *Technically-proficient ultrasound-guided subacromial joint aspiration and  subsequent injection     03/28/23: 2 view x-ray of the left hip including AP and lateral film were ordered  and reviewed by myself today. X-ray shows a femoral head well-seated  within acetabulum. There is only mild, age-appropriate arthritic change.   No acute fracture or otherwise bony abnormality noted.   Past Medical/Family/Surgical/Social History: Medications  & Allergies reviewed per EMR, new medications updated. Patient Active Problem List   Diagnosis Date Noted   Primary osteoarthritis of left knee 09/23/2021   Pure hypercholesterolemia 11/14/2019   Hyperglycemia 11/14/2019   Decreased visual acuity 11/14/2019   Other fatigue 11/14/2019   Positional lightheadedness 11/14/2019   Gastroesophageal reflux disease 02/04/2019   BMI 32.0-32.9,adult 02/12/2018   Obesity (BMI 30.0-34.9) 07/17/2017   Impingement syndrome of right shoulder 07/04/2016   Chronic pain of both knees 07/04/2016   Lumbar back pain with radiculopathy affecting left lower extremity 04/11/2016   Displaced transverse fracture of left patella, subsequent encounter for closed fracture with delayed healing 02/03/2016   Finger fracture, left 02/03/2016   Leg cramps, sleep related 11/19/2015   Cervical spondylosis without myelopathy 06/08/2015   Osteoarthritis of finger of right hand 06/08/2015   Absolute anemia 05/16/2014   Primary osteoarthritis involving multiple joints 05/16/2014   Cataract cortical, senile 05/16/2014   Essential hypertension 09/03/2012   Edema of both legs    Senile osteoporosis    B12 deficiency due to diet    Past Medical History:  Diagnosis Date   Anemia, unspecified    on meds   B12 deficiency due to diet    Basal cell carcinoma    LEFT UPPER ARM SUP. TX=CX3 5FU   Cataract  bilateral sx   Cramp of limb    Edema of both legs    Encounter for long-term (current) use of other medications    Hypertension    on meds   Lumbago    Melanoma (HCC) 10/18/2010   RIGHT POST SHOULDER =MOHS   Obesity    Osteoarthrosis, unspecified whether generalized or localized, unspecified site    generalized   Restless legs syndrome (RLS)    SCC (squamous cell carcinoma) 07/03/2017   RIGHT CHEST CX3 5FU   SCC (squamous cell carcinoma) 01/28/2019   LEFT FOREARM CX3 5FU   SCC (squamous cell carcinoma) [id number]   RIGHT FOREHEAD CX3 5FU   SCC (squamous cell  carcinoma) 07/03/2017   RIGHT FOREARM CX3 5FU   SCCA (squamous cell carcinoma) of skin 04/08/2020   Right Anterior Mandible (in situ)(CX35FU)   SCCA (squamous cell carcinoma) of skin 04/08/2020   Left Supraorbital Region (Keratoacanthoma)   Senile osteoporosis    Squamous cell carcinoma of skin 01/30/2017   RIGHT JAWLINE TX WITH BX   Family History  Problem Relation Age of Onset   Diabetes Father    Prostate cancer Father    Rectal cancer Sister 61   Colon polyps Sister    Cancer Sister    Diabetes Brother    Prostate cancer Brother    Lung cancer Brother 44   Other Brother        declined after a fall   Stomach cancer Maternal Grandmother    Pancreatic cancer Neg Hx    Colon cancer Neg Hx    Esophageal cancer Neg Hx    Past Surgical History:  Procedure Laterality Date   CATARACT EXTRACTION W/ INTRAOCULAR LENS  IMPLANT, BILATERAL  04/2014   COLONOSCOPY     EYE SURGERY Right 04/28/2016   Macular   SPINE SURGERY  2005   STOMACH SURGERY  1981   stapleing    TONSILLECTOMY     UPPER GASTROINTESTINAL ENDOSCOPY     VITRECTOMY Right    with membrane peeling for a macular pucker   Social History   Occupational History   Occupation: Retired Nurse, Mental Health: [employer]  Tobacco Use   Smoking status: Former    Current packs/day: 0.00    Types: Cigarettes    Start date: 1955    Quit date: 1975    Years since quitting: 50.0   Smokeless tobacco: Never   Tobacco comments:    Quit in early 30's  Vaping Use   Vaping status: Never Used  Substance and Sexual Activity   Alcohol use: No    Alcohol/week: 0.0 standard drinks of alcohol   Drug use: No   Sexual activity: Never

## 2023-04-12 ENCOUNTER — Encounter: Payer: Self-pay | Admitting: Neurology

## 2023-04-12 ENCOUNTER — Ambulatory Visit (INDEPENDENT_AMBULATORY_CARE_PROVIDER_SITE_OTHER): Payer: Medicare Other | Admitting: Neurology

## 2023-04-12 VITALS — BP 140/71 | HR 76 | Ht 60.5 in | Wt 121.0 lb

## 2023-04-12 DIAGNOSIS — M5416 Radiculopathy, lumbar region: Secondary | ICD-10-CM

## 2023-04-12 DIAGNOSIS — M1990 Unspecified osteoarthritis, unspecified site: Secondary | ICD-10-CM | POA: Diagnosis not present

## 2023-04-12 DIAGNOSIS — R202 Paresthesia of skin: Secondary | ICD-10-CM

## 2023-04-12 DIAGNOSIS — G629 Polyneuropathy, unspecified: Secondary | ICD-10-CM | POA: Diagnosis not present

## 2023-04-12 NOTE — Patient Instructions (Addendum)
 Continue gabapentin  300 mg in the morning and 600 mg at night.  You can also try Lidocaine  cream as needed. Apply wear you have pain, tingling, or burning. Wear gloves to prevent your hands being numb. This can be bought over the counter at any drug store or online.   Alpha lipoic acid 600mg  daily has some research data suggesting it helps with nerve health. No major side effects other than <1% of people report upset stomach. This can be taken twice per day (1200mg  daily) if no relief obtained. You can buy this over the counter or online.  The physicians and staff at [facility] are committed to providing excellent care. You may receive a survey requesting feedback about your experience at our office. We strive to receive "very good" responses to the survey questions. If you feel that your experience would prevent you from giving the office a "very good " response, please contact our office to try to remedy the situation. We may be reached at [phone]. Thank you for taking the time out of your busy day to complete the survey.  Jody Coats, MD [facility]  Preventing Falls at [facility] are common, often dreaded events in the lives of older people. Aside from the obvious injuries and even death that may result, fall can cause wide-ranging consequences including loss of independence, mental decline, decreased activity and mobility. Younger people are also at risk of falling, especially those with chronic illnesses and fatigue.  Ways to reduce risk for falling Examine diet and medications. Warm foods and alcohol dilate blood vessels, which can lead to dizziness when standing. Sleep aids, antidepressants and pain medications can also increase the likelihood of a fall.  Get a vision exam. Poor vision, cataracts and glaucoma increase the chances of falling.  Check foot gear. Shoes should fit snugly and have a sturdy, nonskid sole and a broad, low heel  Participate in a  physician-approved exercise program to build and maintain muscle strength and improve balance and coordination. Programs that use ankle weights or stretch bands are excellent for muscle-strengthening. Water aerobics programs and low-impact Tai Chi programs have also been shown to improve balance and coordination.  Increase vitamin D  intake. Vitamin D  improves muscle strength and increases the amount of calcium the body is able to absorb and deposit in bones.  How to prevent falls from common hazards Floors - Remove all loose wires, cords, and throw rugs. Minimize clutter. Make sure rugs are anchored and smooth. Keep furniture in its usual place.  Chairs -- Use chairs with straight backs, armrests and firm seats. Add firm cushions to existing pieces to add height.  Bathroom - Install grab bars and non-skid tape in the tub or shower. Use a bathtub transfer bench or a shower chair with a back support Use an elevated toilet seat and/or safety rails to assist standing from a low surface. Do not use towel racks or bathroom tissue holders to help you stand.  Lighting - Make sure halls, stairways, and entrances are well-lit. Install a night light in your bathroom or hallway. Make sure there is a light switch at the top and bottom of the staircase. Turn lights on if you get up in the middle of the night. Make sure lamps or light switches are within reach of the bed if you have to get up during the night.  Kitchen - Install non-skid rubber mats near the sink and stove. Clean spills immediately. Store frequently used utensils, pots, pans between  waist and eye level. This helps prevent reaching and bending. Sit when getting things out of lower cupboards.  Living room/ Bedrooms - Place furniture with wide spaces in between, giving enough room to move around. Establish a route through the living room that gives you something to hold onto as you walk.  Stairs - Make sure treads, rails, and rugs are secure. Install  a rail on both sides of the stairs. If stairs are a threat, it might be helpful to arrange most of your activities on the lower level to reduce the number of times you must climb the stairs.  Entrances and doorways - Install metal handles on the walls adjacent to the doorknobs of all doors to make it more secure as you travel through the doorway.  Tips for maintaining balance Keep at least one hand free at all times. Try using a backpack or fanny pack to hold things rather than carrying them in your hands. Never carry objects in both hands when walking as this interferes with keeping your balance.  Attempt to swing both arms from front to back while walking. This might require a conscious effort if Parkinson's disease has diminished your movement. It will, however, help you to maintain balance and posture, and reduce fatigue.  Consciously lift your feet off of the ground when walking. Shuffling and dragging of the feet is a common culprit in losing your balance.  When trying to navigate turns, use a "U" technique of facing forward and making a wide turn, rather than pivoting sharply.  Try to stand with your feet shoulder-length apart. When your feet are close together for any length of time, you increase your risk of losing your balance and falling.  Do one thing at a time. Don't try to walk and accomplish another task, such as reading or looking around. The decrease in your automatic reflexes complicates motor function, so the less distraction, the better.  Do not wear rubber or gripping soled shoes, they might "catch" on the floor and cause tripping.  Move slowly when changing positions. Use deliberate, concentrated movements and, if needed, use a grab bar or walking aid. Count 15 seconds between each movement. For example, when rising from a seated position, wait 15 seconds after standing to begin walking.  If balance is a continuous problem, you might want to consider a walking aid such as a  cane, walking stick, or walker. Once you've mastered walking with help, you might be ready to try it on your own again.

## 2023-04-18 ENCOUNTER — Ambulatory Visit: Payer: Medicare Other | Admitting: Cardiology

## 2023-04-18 ENCOUNTER — Encounter: Payer: Self-pay | Admitting: Sports Medicine

## 2023-04-18 ENCOUNTER — Ambulatory Visit (INDEPENDENT_AMBULATORY_CARE_PROVIDER_SITE_OTHER): Payer: Medicare Other | Admitting: Sports Medicine

## 2023-04-18 DIAGNOSIS — M7918 Myalgia, other site: Secondary | ICD-10-CM

## 2023-04-18 DIAGNOSIS — M25511 Pain in right shoulder: Secondary | ICD-10-CM | POA: Diagnosis not present

## 2023-04-18 DIAGNOSIS — G8929 Other chronic pain: Secondary | ICD-10-CM

## 2023-04-18 DIAGNOSIS — M75101 Unspecified rotator cuff tear or rupture of right shoulder, not specified as traumatic: Secondary | ICD-10-CM

## 2023-04-18 DIAGNOSIS — S76302D Unspecified injury of muscle, fascia and tendon of the posterior muscle group at thigh level, left thigh, subsequent encounter: Secondary | ICD-10-CM

## 2023-04-18 DIAGNOSIS — M12811 Other specific arthropathies, not elsewhere classified, right shoulder: Secondary | ICD-10-CM

## 2023-04-18 NOTE — Progress Notes (Signed)
Patient says that she has not had any recent episodes where she was unable to put weight on the left leg. She says it is still painful and she avoids sitting for too long.

## 2023-04-18 NOTE — Progress Notes (Signed)
Jody [name] - 83 y.o. female MRN [id number]  Date of birth: March 27, 1941  Office Visit Note: Visit Date: 04/18/2023 [facility]: [facility]erred by: [facility]  Subjective: Chief Complaint  Patient presents with   Left Hip - Follow-up   HPI: Jody [name] is a pleasant 83 y.o. female who presents today for follow-up of left posterior hip/hamstring pain and R-shoulder pain.  Left posterior hip/buttock -this is still present, but has not had any severe episodes or catching sensations as she does avoid provocative maneuvers that caused this.  She continues on her gabapentin 300 mg daily (takes this also for her cervical pain - hx of fusion)  as well as her Celebrex 100 mg every day.  R shoulder -this is feeling much improved after we did aspirate fluid x 2 and inject the joint.  She still has some pain with reaching motions but this is chronic for her.  She is agreeable to physical therapy referral.  Pertinent ROS were reviewed with the patient and found to be negative unless otherwise specified above in HPI.   Assessment & Plan: Visit Diagnoses:  1. Hamstring injury, left, subsequent encounter   2. Pain in left buttock   3. Chronic right shoulder pain   4. Tear of right supraspinatus tendon   5. Rotator cuff arthropathy of right shoulder    Plan: Impression is left posterior buttock pain near the ischial tuberosity with the insertion of the hamstring origin.  Based on her examination, I do believe she has partial tearing of the hamstring origin which is causing inflammation and a catching sensation with activation of the hamstrings over the ischial tuberosity.  Through shared decision making, we did proceed with extracorporeal shockwave treatment therapy.  I would like to perform 2-3 treatments to see what sort of cumulative benefit she has going forward.  I also would like to get her started into formalized physical therapy for her proximal hamstrings as  well as her right shoulder which has both advanced OA as well as complete tear of the supraspinatus tendon in order to get her as functional as possible.  She will continue her Celebrex 100 mg once daily for pain control.  She will follow-up with me in 2 weeks for repeat shockwave and further evaluation.  Additional treatment considerations may include ultrasound of the proximal hamstring/ischial bursa or MRI  Follow-up: 2 weeks    Meds & Orders: No orders of the defined types were placed in this encounter.   Orders Placed This Encounter  Procedures   Ambulatory referral to Physical Therapy     Procedures: Procedure: ECSWT Indications:  Proximal hamstring tendinopathy/Ischial bursitis   Procedure Details Consent: Risks of procedure as well as the alternatives and risks of each were explained to the patient.  Verbal consent for procedure obtained. Time Out: Verified patient identification, verified procedure, site was marked, verified correct patient position. The area was cleaned with alcohol swab.     The Left proximal hamstring origin and ischial bursa was targeted for Extracorporeal shockwave therapy.    Preset: Tendinopathy/Ischial Bursitis Power Level: 100-110 mJ Frequency: 10-12 Hz Impulse/cycles: 3000 Head size: Regular   Patient tolerated procedure well without immediate complications.         Clinical History: No specialty comments available.  She reports that she quit smoking about 50 years ago. Her smoking use included cigarettes. She started smoking about 70 years ago. She has never used smokeless tobacco. No results for input(s): "HGBA1C", "  LABURIC" in the last 8760 hours.  Objective:    Physical Exam  Gen: Well-appearing, in no acute distress; non-toxic CV: Well-perfused. Warm.  Resp: Breathing unlabored on room air; no wheezing. Psych: Fluid speech in conversation; appropriate affect; normal thought process  Ortho Exam - Right shoulder: There is no  significant effusion, trace effusion of the GHJ.  She has full range of motion with passive range of motion but still has restriction in active range of motion with forward flexion past 135 and abduction past 130.  There is a positive drop arm test and weakness with empty can and external rotation.  - Left posterior hip: + TTP with a degree of soft tissue fascial restriction over the medial aspect of the ischial tuberosity hamstring origin.  No redness swelling or effusion here.  Imaging:  [place] Extrem Up Right Ltd Limited musculoskeletal ultrasound of the right shoulder was performed  today.  There is a moderate to large subacromial joint effusion with  notable hyperemia indicative of bursitis.  There appears to be a  full-thickness tear of the supraspinatus with retraction.  Biceps tendon  is seen but with mild subluxation given her subacromial joint effusion.  *Technically-proficient ultrasound-guided subacromial joint aspiration and  subsequent injection  Past Medical/Family/Surgical/Social History: Medications & Allergies reviewed per EMR, new medications updated. Patient Active Problem List   Diagnosis Date Noted   Primary osteoarthritis of left knee 09/23/2021   Pure hypercholesterolemia 11/14/2019   Hyperglycemia 11/14/2019   Decreased visual acuity 11/14/2019   Other fatigue 11/14/2019   Positional lightheadedness 11/14/2019   Gastroesophageal reflux disease 02/04/2019   BMI 32.0-32.9,adult 02/12/2018   Obesity (BMI 30.0-34.9) 07/17/2017   Impingement syndrome of right shoulder 07/04/2016   Chronic pain of both knees 07/04/2016   Lumbar back pain with radiculopathy affecting left lower extremity 04/11/2016   Displaced transverse fracture of left patella, subsequent encounter for closed fracture with delayed healing 02/03/2016   Finger fracture, left 02/03/2016   Leg cramps, sleep related 11/19/2015   Cervical spondylosis without myelopathy 06/08/2015   Osteoarthritis of  finger of right hand 06/08/2015   Absolute anemia 05/16/2014   Primary osteoarthritis involving multiple joints 05/16/2014   Cataract cortical, senile 05/16/2014   Essential hypertension 09/03/2012   Edema of both legs    Senile osteoporosis    B12 deficiency due to diet    Past Medical History:  Diagnosis Date   Anemia, unspecified    on meds   B12 deficiency due to diet    Basal cell carcinoma    LEFT UPPER ARM SUP. TX=CX3 5FU   Cataract    bilateral sx   Cramp of limb    Edema of both legs    Encounter for long-term (current) use of other medications    Hypertension    on meds   Lumbago    Melanoma (HCC) 10/18/2010   RIGHT POST SHOULDER =MOHS   Obesity    Osteoarthrosis, unspecified whether generalized or localized, unspecified site    generalized   Restless legs syndrome (RLS)    SCC (squamous cell carcinoma) 07/03/2017   RIGHT CHEST CX3 5FU   SCC (squamous cell carcinoma) 01/28/2019   LEFT FOREARM CX3 5FU   SCC (squamous cell carcinoma) [id number]   RIGHT FOREHEAD CX3 5FU   SCC (squamous cell carcinoma) 07/03/2017   RIGHT FOREARM CX3 5FU   SCCA (squamous cell carcinoma) of skin 04/08/2020   Right Anterior Mandible (in situ)(CX35FU)   SCCA (squamous cell carcinoma) of skin 04/08/2020  Left Supraorbital Region (Keratoacanthoma)   Senile osteoporosis    Squamous cell carcinoma of skin 01/30/2017   RIGHT JAWLINE TX WITH BX   Family History  Problem Relation Age of Onset   Diabetes Father    Prostate cancer Father    Rectal cancer Sister 33   Colon polyps Sister    Cancer Sister    Diabetes Brother    Prostate cancer Brother    Lung cancer Brother 72   Other Brother        declined after a fall   Stomach cancer Maternal Grandmother    Pancreatic cancer Neg Hx    Colon cancer Neg Hx    Esophageal cancer Neg Hx    Past Surgical History:  Procedure Laterality Date   CATARACT EXTRACTION W/ INTRAOCULAR LENS  IMPLANT, BILATERAL  04/2014   COLONOSCOPY      EYE SURGERY Right 04/28/2016   Macular   SPINE SURGERY  2005   STOMACH SURGERY  1981   stapleing    TONSILLECTOMY     UPPER GASTROINTESTINAL ENDOSCOPY     VITRECTOMY Right    with membrane peeling for a macular pucker   Social History   Occupational History   Occupation: Retired Nurse, mental health: [employer]  Tobacco Use   Smoking status: Former    Current packs/day: 0.00    Types: Cigarettes    Start date: 1955    Quit date: 1975    Years since quitting: 50.0   Smokeless tobacco: Never   Tobacco comments:    Quit in early 30's  Vaping Use   Vaping status: Never Used  Substance and Sexual Activity   Alcohol use: No    Alcohol/week: 0.0 standard drinks of alcohol   Drug use: No   Sexual activity: Never

## 2023-05-02 ENCOUNTER — Encounter: Payer: Self-pay | Admitting: Cardiology

## 2023-05-02 ENCOUNTER — Ambulatory Visit (INDEPENDENT_AMBULATORY_CARE_PROVIDER_SITE_OTHER): Payer: Medicare Other | Admitting: Sports Medicine

## 2023-05-02 DIAGNOSIS — G629 Polyneuropathy, unspecified: Secondary | ICD-10-CM

## 2023-05-02 DIAGNOSIS — M7918 Myalgia, other site: Secondary | ICD-10-CM

## 2023-05-02 DIAGNOSIS — S76302D Unspecified injury of muscle, fascia and tendon of the posterior muscle group at thigh level, left thigh, subsequent encounter: Secondary | ICD-10-CM

## 2023-05-02 DIAGNOSIS — G5602 Carpal tunnel syndrome, left upper limb: Secondary | ICD-10-CM | POA: Diagnosis not present

## 2023-05-02 LAB — LIPID PANEL
Chol/HDL Ratio: 1.9 {ratio} (ref 0.0–4.4)
Cholesterol, Total: 144 mg/dL (ref 100–199)
HDL: 74 mg/dL (ref >39)
LDL Chol Calc (NIH): 54 mg/dL (ref 0–99)
Triglycerides: 87 mg/dL (ref 0–149)
VLDL Cholesterol Cal: 16 mg/dL (ref 5–40)

## 2023-05-02 NOTE — Progress Notes (Signed)
NOrmal cholesterol levels. Continue present medications

## 2023-05-02 NOTE — Progress Notes (Signed)
 Jody [name] - 83 y.o. female MRN [id number]  Date of birth: 1941-01-29  Office Visit Note: Visit Date: 05/02/2023 [facility]CP: [facility] Referred by: [facility]  Subjective: Chief Complaint  Patient presents with   Left Hip - Follow-up   HPI: Jody [name] is a pleasant 83 y.o. female who presents today for follow-up of posterior hip/likely hamstring tear.  Left buttock/hamstring - we performed our first ECSWT on 04/18/23, and she is still having pain but feels it is less intense since the treatment.  She feels better getting up and walking around and has not had to use her cane recently when getting out of bed. Has first PT evaluation appt on 05/08/23.  Her shoulder pain and mobility has significantly improved after our aspirations and injections.  Got back into the pool for the first time recently.  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 Left is worse than right.  Pertinent ROS were reviewed with the patient and found to be negative unless otherwise specified above in HPI.   Assessment & Plan: Visit Diagnoses:  1. Hamstring injury, left, subsequent encounter   2. Pain in left buttock   3. Peripheral polyneuropathy   4. Carpal tunnel syndrome, left upper limb    Plan: Impression is left posterior buttock and proximal hamstring origin pain, given her exam this is very likely a partial or high-grade tear of the proximal hamstring origin.  We have performed 1 session of extracorporeal shockwave therapy which she did notice some improvement, we repeated a second treatment today.  She will start her formalized physical therapy for this as well.  For her carpal tunnel, she can remain in her cock up wrist braces, she is on gabapentin  300 mg a.m. and 600 mg nightly for her peripheral polyneuropathy.  We did discuss considering corticosteroid injection/sciatic nerve  hydrodissection in the future if this does not settle down as well.  She will progress through PT and I will see her back after a few treatments in about 3 weeks for reevaluation, we will consider additional shockwave therapy if she is starting to find chemo to benefit.  Additional treatment considerations may include ultrasound of the proximal hamstring/ischial bursa or MRI for further evaluation (but discussed for now would not change management as we are treating for suspected proximal hamstring tear).  Follow-up: Return in about 3 weeks (around 05/23/2023) for for L-hamstring (reg visit).   Meds & Orders: No orders of the defined types were placed in this encounter.  No orders of the defined types were placed in this encounter.    Procedures: Procedure: ECSWT Indications:  Proximal hamstring tendinopathy/Ischial bursitis   Procedure Details Consent: Risks of procedure as well as the alternatives and risks of each were explained to the patient.  Verbal consent for procedure obtained. Time Out: Verified patient identification, verified procedure, site was marked, verified correct patient position. The area was cleaned with alcohol swab.     The Left proximal hamstring origin and ischial bursa was targeted for Extracorporeal shockwave therapy.    Preset: Tendinopathy/Ischial Bursitis Power Level: 110-120 mJ  Frequency: 12-14 Hz Impulse/cycles: 3000 Head size: Regular   Patient tolerated procedure well without immediate complications.      Clinical History: No specialty comments available.  She reports that she quit smoking about 50 years ago. Her smoking use included cigarettes. She started smoking about 70 years ago. She has never  used smokeless tobacco. No results for input(s): HGBA1C, LABURIC in the last 8760 hours.  Objective:    Physical Exam  Gen: Well-appearing, in no acute distress; non-toxic CV: Well-perfused. Warm.  Resp: Breathing unlabored on room air; no  wheezing. Psych: Fluid speech in conversation; appropriate affect; normal thought process  Ortho Exam - Left buttock: Positive TTP over the ischial tuberosity and the insertion of the left hamstring origin.  No other change from last examination.  - Bilateral wrists: + Tinel's on the left, equivocal on the right.  There is a small volar ganglion cyst on the left wrist.  Arthritic change about the fingers.  No wrist swelling or effusion.  Imaging: No results found.  Past Medical/Family/Surgical/Social History: Medications & Allergies reviewed per EMR, new medications updated. Patient Active Problem List   Diagnosis Date Noted   Primary osteoarthritis of left knee 09/23/2021   Pure hypercholesterolemia 11/14/2019   Hyperglycemia 11/14/2019   Decreased visual acuity 11/14/2019   Other fatigue 11/14/2019   Positional lightheadedness 11/14/2019   Gastroesophageal reflux disease 02/04/2019   BMI 32.0-32.9,adult 02/12/2018   Obesity (BMI 30.0-34.9) 07/17/2017   Impingement syndrome of right shoulder 07/04/2016   Chronic pain of both knees 07/04/2016   Lumbar back pain with radiculopathy affecting left lower extremity 04/11/2016   Displaced transverse fracture of left patella, subsequent encounter for closed fracture with delayed healing 02/03/2016   Finger fracture, left 02/03/2016   Leg cramps, sleep related 11/19/2015   Cervical spondylosis without myelopathy 06/08/2015   Osteoarthritis of finger of right hand 06/08/2015   Absolute anemia 05/16/2014   Primary osteoarthritis involving multiple joints 05/16/2014   Cataract cortical, senile 05/16/2014   Essential hypertension 09/03/2012   Edema of both legs    Senile osteoporosis    B12 deficiency due to diet    Past Medical History:  Diagnosis Date   Anemia, unspecified    on meds   B12 deficiency due to diet    Basal cell carcinoma    LEFT UPPER ARM SUP. TX=CX3 5FU   Cataract    bilateral sx   Cramp of limb    Edema of  both legs    Encounter for long-term (current) use of other medications    Hypertension    on meds   Lumbago    Melanoma (HCC) 10/18/2010   RIGHT POST SHOULDER =MOHS   Obesity    Osteoarthrosis, unspecified whether generalized or localized, unspecified site    generalized   Restless legs syndrome (RLS)    SCC (squamous cell carcinoma) 07/03/2017   RIGHT CHEST CX3 5FU   SCC (squamous cell carcinoma) 01/28/2019   LEFT FOREARM CX3 5FU   SCC (squamous cell carcinoma) [id number]   RIGHT FOREHEAD CX3 5FU   SCC (squamous cell carcinoma) 07/03/2017   RIGHT FOREARM CX3 5FU   SCCA (squamous cell carcinoma) of skin 04/08/2020   Right Anterior Mandible (in situ)(CX35FU)   SCCA (squamous cell carcinoma) of skin 04/08/2020   Left Supraorbital Region (Keratoacanthoma)   Senile osteoporosis    Squamous cell carcinoma of skin 01/30/2017   RIGHT JAWLINE TX WITH BX   Family History  Problem Relation Age of Onset   Diabetes Father    Prostate cancer Father    Rectal cancer Sister 7   Colon polyps Sister    Cancer Sister    Diabetes Brother    Prostate cancer Brother    Lung cancer Brother 80   Other Brother  declined after a fall   Stomach cancer Maternal Grandmother    Pancreatic cancer Neg Hx    Colon cancer Neg Hx    Esophageal cancer Neg Hx    Past Surgical History:  Procedure Laterality Date   CATARACT EXTRACTION W/ INTRAOCULAR LENS  IMPLANT, BILATERAL  04/2014   COLONOSCOPY     EYE SURGERY Right 04/28/2016   Macular   SPINE SURGERY  2005   STOMACH SURGERY  1981   stapleing    TONSILLECTOMY     UPPER GASTROINTESTINAL ENDOSCOPY     VITRECTOMY Right    with membrane peeling for a macular pucker   Social History   Occupational History   Occupation: Retired Nurse, Mental Health: [employer]  Tobacco Use   Smoking status: Former    Current packs/day: 0.00    Types: Cigarettes    Start date: 1955    Quit date: 1975    Years since quitting: 50.1    Smokeless tobacco: Never   Tobacco comments:    Quit in early 30's  Vaping Use   Vaping status: Never Used  Substance and Sexual Activity   Alcohol use: No    Alcohol/week: 0.0 standard drinks of alcohol   Drug use: No   Sexual activity: Never

## 2023-05-02 NOTE — Progress Notes (Signed)
 Patient says that her pain is less intense after the treatment. She says that she feels better being up and walking around and has not had to use her cane recently when getting out of bed at night. She says that she has also noticed a difference in her pain and mobility in her shoulder. She has been able to get back into the pool and feels good with that.

## 2023-05-03 ENCOUNTER — Encounter: Payer: Self-pay | Admitting: Sports Medicine

## 2023-05-05 ENCOUNTER — Ambulatory Visit: Payer: Medicare Other | Admitting: Physician Assistant

## 2023-05-05 NOTE — Therapy (Signed)
 OUTPATIENT PHYSICAL THERAPY  EVALUATION   Patient Name: Jody [name] MRN: [id number] DOB:08-28-1940, 83 y.o., female Today's Date: 05/08/2023  END OF SESSION:  PT End of Session - 05/08/23 1257     Visit Number 1    Number of Visits 20    Date for PT Re-Evaluation 07/17/23    Authorization Type Medicare and [employer] for life    Progress Note Due on Visit 10    PT Start Time 1300    PT Stop Time 1345    PT Time Calculation (min) 45 min    Activity Tolerance Patient limited by pain    Behavior During Therapy WFL for tasks assessed/performed             Past Medical History:  Diagnosis Date   Anemia, unspecified    on meds   B12 deficiency due to diet    Basal cell carcinoma    LEFT UPPER ARM SUP. TX=CX3 5FU   Cataract    bilateral sx   Cramp of limb    Edema of both legs    Encounter for long-term (current) use of other medications    Hypertension    on meds   Lumbago    Melanoma (HCC) 10/18/2010   RIGHT POST SHOULDER =MOHS   Obesity    Osteoarthrosis, unspecified whether generalized or localized, unspecified site    generalized   Restless legs syndrome (RLS)    SCC (squamous cell carcinoma) 07/03/2017   RIGHT CHEST CX3 5FU   SCC (squamous cell carcinoma) 01/28/2019   LEFT FOREARM CX3 5FU   SCC (squamous cell carcinoma) [id number]   RIGHT FOREHEAD CX3 5FU   SCC (squamous cell carcinoma) 07/03/2017   RIGHT FOREARM CX3 5FU   SCCA (squamous cell carcinoma) of skin 04/08/2020   Right Anterior Mandible (in situ)(CX35FU)   SCCA (squamous cell carcinoma) of skin 04/08/2020   Left Supraorbital Region (Keratoacanthoma)   Senile osteoporosis    Squamous cell carcinoma of skin 01/30/2017   RIGHT JAWLINE TX WITH BX   Past Surgical History:  Procedure Laterality Date   CATARACT EXTRACTION W/ INTRAOCULAR LENS  IMPLANT, BILATERAL  04/2014   COLONOSCOPY     EYE SURGERY Right 04/28/2016   Macular   SPINE SURGERY  2005   STOMACH SURGERY  1981   stapleing     TONSILLECTOMY     UPPER GASTROINTESTINAL ENDOSCOPY     VITRECTOMY Right    with membrane peeling for a macular pucker   Patient Active Problem List   Diagnosis Date Noted   Primary osteoarthritis of left knee 09/23/2021   Pure hypercholesterolemia 11/14/2019   Hyperglycemia 11/14/2019   Decreased visual acuity 11/14/2019   Other fatigue 11/14/2019   Positional lightheadedness 11/14/2019   Gastroesophageal reflux disease 02/04/2019   BMI 32.0-32.9,adult 02/12/2018   Obesity (BMI 30.0-34.9) 07/17/2017   Impingement syndrome of right shoulder 07/04/2016   Chronic pain of both knees 07/04/2016   Lumbar back pain with radiculopathy affecting left lower extremity 04/11/2016   Displaced transverse fracture of left patella, subsequent encounter for closed fracture with delayed healing 02/03/2016   Finger fracture, left 02/03/2016   Leg cramps, sleep related 11/19/2015   Cervical spondylosis without myelopathy 06/08/2015   Osteoarthritis of finger of right hand 06/08/2015   Absolute anemia 05/16/2014   Primary osteoarthritis involving multiple joints 05/16/2014   Cataract cortical, senile 05/16/2014   Essential hypertension 09/03/2012   Edema of both legs    Senile osteoporosis    B12  deficiency due to diet     [facility]: [facility]  REFERRING PROVIDER: Shauna Del, DO  REFERRING DIAG: [phone] (ICD-10-CM) - Pain in left buttock M25.511,G89.29 (ICD-10-CM) - Chronic right shoulder pain M75.101 (ICD-10-CM) - Tear of right supraspinatus tendon M12.811 (ICD-10-CM) - Rotator cuff arthropathy of right shoulder S76.302D (ICD-10-CM) - Hamstring injury, left, subsequent encounter  THERAPY DIAG:  Chronic right shoulder pain  Pain in left leg  Muscle weakness (generalized)  Difficulty in walking, not elsewhere classified  Abnormal posture  Rationale for Evaluation and Treatment: Rehabilitation  ONSET DATE: Fall 2024  SUBJECTIVE:                                                                                                                                                                                       SUBJECTIVE STATEMENT: Pt indicated having complaints of Rt shoulder and Lt hip/leg symptoms.  Pt indicated chronic pain in Rt shoulder dating back to 2005 with variable levels of symptoms over the years.  Pt indicated in Nov 2024, she had more complaints "more acute" than it had been.  Pt indicated some Lt shoulder pain recently as well as Rt shoulder has hurt.  Pt indicated complaints in Lt hip/leg hurting more in Dec 2024.  Pt indicated using cane in Rt hand and noted complaints in Rt shoulder with use of cane.  Insidious worsening of symptoms were noted.   Pt indicated reaching /lifting troublesome.  Pt indicated gabapentin  for symptoms at night.  Does wake at night due to symptoms at times.  Pt indicated leg cramps at night but different than the hip pain.  Pt indicated history of back pain.   Pt indicated seeing physical therapy for years for shoulder (not in [facility]).  Pt indicated having needling on shoulder without specific information.   Pt indicated doing exercise in water.   PERTINENT HISTORY: Med hx:  Anemia, history of carcinoma, edema LE, HTN, low back pain, OA, osteoporosis per chart review  PAIN:  NPRS scale:  current 5/10 at worst 8/10 Pain location: Rt shoulder (Lt shoulder at times).  Lt hip/buttock Pain description: tightness, achy Aggravating factors: Rt shoulder:  reaching/lifting, use of cane, reaching back, some resistance in water.    Lt hip/buttock  certain movements cause snapping of tendon, walking/bending.  Relieving factors: medicine, water activity/movement  PRECAUTIONS: None  WEIGHT BEARING RESTRICTIONS: No  FALLS:  Has patient fallen in last 6 months? No  LIVING ENVIRONMENT: Lives with: lives with their family Lives in: House/apartment Stairs: 1 step from carport to kitchen no railing.  Has following equipment at home: cane,  walker. - cane in public  2 cats at  home.   OCCUPATION: Retired  PLOF: Independent, water exercise.  Rt hand dominant.   PATIENT GOALS: Reduce pain, keep activity and maybe improve balance.    OBJECTIVE:   PATIENT SURVEYS:  05/08/2023 FOTO intake:   42  predicted:  44  COGNITION: 05/08/2023 Overall cognitive status: WFL     SENSATION: 05/08/2023 Not tested  POSTURE: 05/08/2023 Mild rounded shoulders, increased thoracic kyphosis, weight shift off Lt to Rt mild in standing.   UPPER EXTREMITY ROM:   ROM Right 05/08/2023 Left 05/08/2023  Shoulder flexion 90 deg AROM against gravity  140 AROM in supine    [facility]   Shoulder extension    Shoulder abduction    Shoulder adduction    Shoulder internal rotation 45 in 45 deg abduction   Shoulder external rotation 60 in 45 deg abduction   Elbow flexion    Elbow extension                            (Blank rows = not tested)   LUMBAR ROM:   ROM AROM  05/08/2023  Flexion To mid shin with pulling noted  Extension 50 % WFL   Right lateral flexion   Left lateral flexion   Right rotation   Left rotation    (Blank rows = not tested)  LOWER EXTREMITY ROM:      Right 05/08/2023 Left 05/08/2023  Hip flexion [facility]  Hip extension    Hip abduction    Hip adduction    Hip internal rotation    Hip external rotation    Knee flexion    Knee extension    Ankle dorsiflexion    Ankle plantarflexion    Ankle inversion    Ankle eversion     (Blank rows = not tested)   UPPER EXTREMITY MMT:  MMT Right 05/08/2023 Left 05/08/2023  Shoulder flexion 2+/5 4/5  Shoulder extension    Shoulder abduction 2+/5 4/5  Shoulder adduction    Shoulder internal rotation 4/5 4/5  Shoulder external rotation 4/5 4/5  Middle trapezius    Lower trapezius    Elbow flexion    Elbow extension    Wrist flexion    Wrist extension    Wrist ulnar deviation    Wrist radial deviation    Wrist pronation    Wrist supination    Grip strength  (lbs)    (Blank rows = not tested)  LOWER EXTREMITY MMT:    MMT Right 05/08/2023 Left 05/08/2023  Hip flexion 4/5 4/5  Hip extension 3+/5 3+/5  Hip abduction 3/5 3/5  Hip adduction    Hip internal rotation    Hip external rotation    Knee flexion    Knee extension 4/5 4/5  Ankle dorsiflexion 5/5 5/5  Ankle plantarflexion    Ankle inversion    Ankle eversion     (Blank rows = not tested)   SPECIAL TESTS: 05/08/2023 Rt shoulder painful arc, shrug in elevation.    JOINT MOBILITY TESTING:  05/08/2023 Crepitus noted in bilateral shoulder, knee bilateral  PALPATION:  05/08/2023 Tenderness Lt ischial tuberosity.  Trigger points noted in Rt infraspinatus, glute max/med.   GAIT Cane use in Rt UE c reduced stance on Lt, reduced step length with Rt leg.  TODAY'S TREATMENT:                                                                                                       DATE: 05/08/2023 Therex:    HEP instruction/performance c cues for techniques, handout provided.  Trial set performed of each for comprehension and symptom assessment.  See below for exercise list Education provided to continue exercise routine in pool as able without pain increase.  Discussed pain monitoring scale for pain amounts to adjust to.      PATIENT EDUCATION: 05/08/2023 Education details: HEP, POC Person educated: Patient Education method: Explanation, Demonstration, Verbal cues, and Handouts Education comprehension: verbalized understanding, returned demonstration, and verbal cues required  HOME EXERCISE PROGRAM: Access Code: [email or web address] URL: [email or web address] Date: 05/08/2023 Prepared by: Bonna Bustard  Exercises - Seated Scapular Retraction  - 3-5 x daily - 7 x weekly - 1 sets - 5-10  reps - 2 hold - Standing Lumbar Extension with Counter  - 3-5 x daily - 7 x weekly - 1 sets - 5-10 reps - 2 hold - Supine Bridge with Resistance Band  - 1-2 x daily - 7 x weekly - 1-2 sets - 10-15 reps - 2 hold - Hooklying Clamshell with Resistance  - 1-2 x daily - 7 x weekly - 1-2 sets - 10-15 reps - 2 hold - Standing Isometric Shoulder External Rotation with Doorway  - 1-2 x daily - 7 x weekly - 1 sets - 10 reps - 5 hold  ASSESSMENT:  CLINICAL IMPRESSION: Patient is a 83 y.o. who comes to clinic with complaints of bilateral shoulder (Rt > Lt), Lt hip/leg pain with mobility, strength and movement coordination deficits that impair their ability to perform usual daily and recreational functional activities without increase difficulty/symptoms at this time.  Patient to benefit from skilled PT services to address impairments and limitations to improve to previous level of function without restriction secondary to condition.   OBJECTIVE IMPAIRMENTS: Abnormal gait, decreased activity tolerance, decreased balance, decreased coordination, decreased endurance, decreased mobility, difficulty walking, decreased ROM, decreased strength, increased fascial restrictions, impaired perceived functional ability, increased muscle spasms, impaired flexibility, impaired UE functional use, improper body mechanics, postural dysfunction, and pain.   ACTIVITY LIMITATIONS: carrying, lifting, bending, sitting, standing, squatting, sleeping, stairs, transfers, bed mobility, and locomotion level  PARTICIPATION LIMITATIONS: meal prep, cleaning, laundry, interpersonal relationship, driving, shopping, and community activity  PERSONAL FACTORS: Med hx:  Anemia, history of carcinoma, edema LE, HTN, low back pain, OA, osteoporosis per chart review are also affecting patient's functional outcome.   REHAB POTENTIAL: Good  CLINICAL DECISION MAKING: Evolving/moderate complexity  EVALUATION COMPLEXITY: Moderate   GOALS: Goals  reviewed with patient? Yes  SHORT TERM GOALS: (target date for Short term goals are 3 weeks 05/29/2023)  1.Patient will demonstrate independent use of home exercise program to maintain progress from in clinic treatments. Goal status: New  LONG TERM GOALS: (target dates for all long term goals are 10 weeks  07/17/2023 )   1. Patient will demonstrate/report pain at worst less than or equal to 2/10 to  facilitate minimal limitation in daily activity secondary to pain symptoms. Goal status: New   2. Patient will demonstrate independent use of home exercise program to facilitate ability to maintain/progress functional gains from skilled physical therapy services. Goal status: New   3. Patient will demonstrate FOTO outcome > or = 44 % to indicate reduced disability due to condition. Goal status: New   4.  Patient will demonstrate Rt UE MMT 4/5 throughout to facilitate lifting, reaching, carrying at [facility] in daily activity.   Goal status: New   5.  Patient will demonstrate Rt GH joint AROM WFL s symptoms to facilitate usual overhead reaching, self care, dressing at PLOF.    Goal status: New   6.  Patient bilateral hip MMT abduction 4/5, hip extension and flexion 5/5 to facilitate ambulation, stairs and other daily mobility s restriction.  Goal status: New   7.  Patient will demonstrate SLS > 10 seconds bilateral for stability improvement in ambulation.  Goal Status: New  PLAN:  PT FREQUENCY: 1-2x/week  PT DURATION: 10 weeks  PLANNED INTERVENTIONS: Can include 16109- PT Re-evaluation, 97110-Therapeutic exercises, 97530- Therapeutic activity, [id number]- Neuromuscular re-education, 97535- Self Care, 97140- Manual therapy, [id number]- Gait training, [phone]- Orthotic Fit/training, [phone]- Canalith repositioning, [id number]- Aquatic Therapy, 97014- Electrical stimulation (unattended), [id number]- Electrical stimulation (manual), [id number] Physical performance testing, 97016- Vasopneumatic device, [id number]- Ultrasound,  [id number]- Traction (mechanical), [id number]- Ionotophoresis 4mg /ml Dexamethasone , Patient/Family education, Balance training, Stair training, Taping, Dry Needling, Joint mobilization, Joint manipulation, Spinal manipulation, Spinal mobilization, Scar mobilization, Vestibular training, Visual/preceptual remediation/compensation, DME instructions, Cryotherapy, and Moist heat.  All performed as medically necessary.  All included unless contraindicated  PLAN FOR NEXT SESSION: Review HEP knowledge/results.   Early shoulder/scapular strengthening. Hip strengthening, early static balance.    Bonna Bustard, PT, DPT, OCS, ATC 05/08/23  2:11 PM

## 2023-05-08 ENCOUNTER — Ambulatory Visit (INDEPENDENT_AMBULATORY_CARE_PROVIDER_SITE_OTHER): Payer: Medicare Other | Admitting: Rehabilitative and Restorative Service Providers"

## 2023-05-08 ENCOUNTER — Encounter: Payer: Self-pay | Admitting: Rehabilitative and Restorative Service Providers"

## 2023-05-08 DIAGNOSIS — R293 Abnormal posture: Secondary | ICD-10-CM

## 2023-05-08 DIAGNOSIS — M6281 Muscle weakness (generalized): Secondary | ICD-10-CM

## 2023-05-08 DIAGNOSIS — M25511 Pain in right shoulder: Secondary | ICD-10-CM | POA: Diagnosis not present

## 2023-05-08 DIAGNOSIS — G8929 Other chronic pain: Secondary | ICD-10-CM

## 2023-05-08 DIAGNOSIS — R262 Difficulty in walking, not elsewhere classified: Secondary | ICD-10-CM | POA: Diagnosis not present

## 2023-05-08 DIAGNOSIS — M79605 Pain in left leg: Secondary | ICD-10-CM | POA: Diagnosis not present

## 2023-05-10 ENCOUNTER — Ambulatory Visit: Payer: Medicare Other | Attending: Cardiology | Admitting: Cardiology

## 2023-05-10 ENCOUNTER — Encounter: Payer: Self-pay | Admitting: Cardiology

## 2023-05-10 ENCOUNTER — Other Ambulatory Visit: Payer: Self-pay

## 2023-05-10 VITALS — BP 123/69 | HR 69 | Resp 16 | Ht 60.0 in | Wt 120.2 lb

## 2023-05-10 DIAGNOSIS — I10 Essential (primary) hypertension: Secondary | ICD-10-CM

## 2023-05-10 DIAGNOSIS — E7800 Pure hypercholesterolemia, unspecified: Secondary | ICD-10-CM | POA: Diagnosis present

## 2023-05-10 DIAGNOSIS — I209 Angina pectoris, unspecified: Secondary | ICD-10-CM

## 2023-05-10 MED ORDER — ASPIRIN 81 MG PO CHEW: 81.0000 mg | CHEWABLE_TABLET | Freq: Every day | ORAL | Status: DC

## 2023-05-10 MED ORDER — ISOSORBIDE MONONITRATE ER 30 MG PO TB24: 30.0000 mg | ORAL_TABLET | Freq: Every day | ORAL | 1 refills | Status: DC

## 2023-05-10 NOTE — Patient Instructions (Signed)
Medication Instructions:  Your physician has recommended you make the following change in your medication:   -Start aspirin 81 mg by mouth daily  -START isosorbide mononitrate 30 mg by mouth daily.  *If you need a refill on your cardiac medications before your next appointment, please call your pharmacy*  Lab Work: Your physician recommends that you have lab work tomorrow or Friday- for BMET and CBC.  If you have labs (blood work) drawn today and your tests are completely normal, you will receive your results only by: MyChart Message (if you have MyChart) OR A paper copy in the mail If you have any lab test that is abnormal or we need to change your treatment, we will call you to review the results.  Testing/Procedures: Your physician has requested that you have a cardiac catheterization. Cardiac catheterization is used to diagnose and/or treat various heart conditions. Doctors may recommend this procedure for a number of different reasons. The most common reason is to evaluate chest pain. Chest pain can be a symptom of coronary artery disease (CAD), and cardiac catheterization can show whether plaque is narrowing or blocking your heart's arteries. This procedure is also used to evaluate the valves, as well as measure the blood flow and oxygen levels in different parts of your heart. For further information please visit [email or web address] Please follow instruction sheet, as given.  Follow-Up: At [facility], you and your health needs are our priority.  As part of our continuing mission to provide you with exceptional heart care, we have created designated Provider Care Teams.  These Care Teams include your primary Cardiologist (physician) and Advanced Practice Providers (APPs -  Physician Assistants and Nurse Practitioners) who all work together to provide you with the care you need, when you need it.  We recommend signing up for the patient portal called "MyChart".  Sign up  information is provided on this After Visit Summary.  MyChart is used to connect with patients for Virtual Visits (Telemedicine).  Patients are able to view lab/test results, encounter notes, upcoming appointments, etc.  Non-urgent messages can be sent to your provider as well.   To learn more about what you can do with MyChart, go to [email or web address].    Your next appointment:   1 month(s)  Provider:   Yates Decamp, MD or APP    Other Instructions   [facility] A DEPT OF [place]. [facility] AT [facility] [address] [place], [place] [address] [place] [address] Dept: [phone] Loc: [phone]  ZERLINE MELCHIOR  05/10/2023  You are scheduled for a Cardiac Catheterization on Monday, February 17 with Dr.  Jacinto Halim .  1. Please arrive at the [facility] (Main Entrance A) at [facility]: [address] [place], [place] [address] at 7:00 AM (This time is 2 hour(s) before your procedure to ensure your preparation).   Free valet parking service is available. You will check in at ADMITTING. The support person will be asked to wait in the waiting room.  It is OK to have someone drop you off and come back when you are ready to be discharged.    Special note: Every effort is made to have your procedure done on time. Please understand that emergencies sometimes delay scheduled procedures.  2. Diet: Do not eat solid foods after midnight.  The patient may have clear liquids until 5am upon the day of the procedure.  3. Labs: You will need to have blood drawn  on Thursday, February 13 at [facility] [address], [place]  Open: 8am - 5pm (Lunch 12:30 - 1:30)   Phone: [phone]. You do not need to be fasting.  4. Medication instructions in preparation for your procedure:   Contrast Allergy: No  On the morning of your procedure, take your Aspirin 81 mg and any morning medicines NOT listed above.  You may use sips of water.  5.  Plan to go home the same day, you will only stay overnight if medically necessary. 6. Bring a current list of your medications and current insurance cards. 7. You MUST have a responsible person to drive you home. 8. Someone MUST be with you the first 24 hours after you arrive home or your discharge will be delayed. 9. Please wear clothes that are easy to get on and off and wear slip-on shoes.  Thank you for allowing [place] to care for you!   -- [facility]

## 2023-05-10 NOTE — Progress Notes (Signed)
Cardiology Office Note:  .   Date:  05/10/2023  ID:  [name], DOB [birth year], MRN [id number] [facility]: [facility] Providers Cardiologist:  Yates Decamp, MD   History of Present Illness: Jody [name]   Jody [name] is a 83 y.o. 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  In view of advanced age, had opted to do medical therapy and did not order any testing.  Sublingual nitroglycerin was also prescribed.  She now presents for follow-up, states that symptoms persist and has used sublingual nitroglycerin yesterday for chest pain with relief.  Discussed the use of AI scribe software for clinical note transcription with the patient, who gave verbal consent to proceed.  History of Present Illness   Jody [name], a patient with a history of arthritis and anemia, presents with ongoing chest discomfort and neck pain. The discomfort is not associated with any specific activity and is relieved by lying down. She also reports a different sensation during heavy water exercises, particularly when doing sit-ups. Jody [name] has been adhering to her prescribed medications, except for the 81mg  aspirin due to previous advice against its use with her arthritis medication, Celebrex. She has used nitroglycerin once, which seemed to ease her discomfort. Jody [name]'s anemia is chronic and stable.      Labs   Lab Results  Component Value Date   CHOL 144 05/01/2023   HDL 74 05/01/2023   LDLCALC 54 05/01/2023   TRIG 87 05/01/2023   CHOLHDL 1.9 05/01/2023   Lab Results  Component Value Date   NA 132 (L) 11/15/2021   K 4.4 11/15/2021   CO2 25 11/15/2021   GLUCOSE 109 (H) 11/15/2021   BUN 15 11/15/2021    CREATININE 1.07 (H) 11/15/2021   CALCIUM 10.4 11/15/2021   GFR 52.74 (L) 11/05/2021   EGFR 52 (L) 11/15/2021   GFRNONAA 41 (L) 11/14/2019      Latest Ref Rng & Units 11/15/2021    3:11 PM 11/05/2021   10:27 AM 05/24/2021    2:53 PM  BMP  Glucose 65 - 99 mg/dL 409  91  811   BUN 7 - 25 mg/dL 15  25  19    Creatinine 0.60 - 0.95 mg/dL 9.14  7.82  9.56   BUN/Creat Ratio 6 - 22 (calc) 14   NOT APPLICABLE   Sodium 135 - 146 mmol/L 132  131  133   Potassium 3.5 - 5.3 mmol/L 4.4  4.2  4.6   Chloride 98 - 110 mmol/L 97  95  97   CO2 20 - 32 mmol/L 25  28  26    Calcium 8.6 - 10.4 mg/dL 21.3  9.7  08.6       Latest Ref Rng & Units 11/05/2021   10:27 AM 05/24/2021    2:53 PM 10/28/2020   10:49 AM  CBC  WBC 4.0 - 10.5 K/uL 6.2  6.1  5.6   Hemoglobin 12.0 - 15.0 g/dL 57.8  46.9  62.9   Hematocrit 36.0 - 46.0 % 32.5  33.4  33.8   Platelets 150.0 - 400.0 K/uL 292.0  295  250    Lab Results  Component Value Date   HGBA1C 5.5 11/14/2019    Lab Results  Component  Value Date   TSH 3.20 11/14/2019    Review of Systems  Cardiovascular:  Positive for chest pain. Negative for dyspnea on exertion and leg swelling.   Physical Exam:   VS:  BP 123/69 (BP Location: Left Arm, Patient Position: Sitting, Cuff Size: Normal)   Pulse 69   Resp 16   Ht 5' (1.524 m)   Wt 120 lb 3.2 oz (54.5 kg)   SpO2 90%   BMI 23.47 kg/m    Wt Readings from Last 3 Encounters:  05/10/23 120 lb 3.2 oz (54.5 kg)  04/12/23 121 lb (54.9 kg)  02/27/23 117 lb (53.1 kg)    Physical Exam Neck:     Vascular: No carotid bruit or JVD.  Cardiovascular:     Rate and Rhythm: Normal rate and regular rhythm.     Pulses: Intact distal pulses.     Heart sounds: Normal heart sounds. No murmur heard.    No gallop.  Pulmonary:     Effort: Pulmonary effort is normal.     Breath sounds: Normal breath sounds.  Abdominal:     General: Bowel sounds are normal.     Palpations: Abdomen is soft.  Musculoskeletal:     Right lower  leg: No edema.     Left lower leg: No edema.    Studies Reviewed: .    NA EKG:   EKG 02/27/2023: Normal sinus rhythm Normal ECG When compared with ECG of 16-Mar-2007 02:14, No significant change was found   Medications and allergies    Allergies  Allergen Reactions   Shrimp [Shellfish Allergy] Anaphylaxis    ALL SEAFOOD   Prolia [Denosumab] Other (See Comments)    Chest pain   Percocet [Oxycodone-Acetaminophen]     Confusion     Current Outpatient Medications:    aspirin (ASPIRIN CHILDRENS) 81 MG chewable tablet, Chew 1 tablet (81 mg total) by mouth daily., Disp: , Rfl:    Bacillus Coagulans-Inulin (ALIGN PREBIOTIC-PROBIOTIC PO), , Disp: , Rfl:    Calcium Carbonate-Vit D-Min (CALCIUM 1200 PO), Take 1 tablet by mouth daily., Disp: , Rfl:    celecoxib (CELEBREX) 100 MG capsule, Take 1 capsule (100 mg total) by mouth 2 (two) times daily as needed. (Patient taking differently: Take 100 mg by mouth daily.), Disp: 180 capsule, Rfl: 2   gabapentin (NEURONTIN) 300 MG capsule, Take 300 mg by mouth 3 (three) times daily., Disp: , Rfl:    Iron 66 MG TABS, Take 1 tablet by mouth daily. , Disp: , Rfl:    isosorbide mononitrate (IMDUR) 30 MG 24 hr tablet, Take 1 tablet (30 mg total) by mouth daily., Disp: 30 tablet, Rfl: 1   metoprolol succinate (TOPROL XL) 25 MG 24 hr tablet, Take 1 tablet (25 mg total) by mouth daily., Disp: 30 tablet, Rfl: 2   Multiple Vitamin (MULTIVITAMIN) tablet, Take 1 tablet by mouth daily., Disp: , Rfl:    nitroGLYCERIN (NITROSTAT) 0.4 MG SL tablet, DISSOLVE 1 TABLET UNDER THE TONGUE EVERY 5 MINUTES AS NEEDED FOR CHEST PAIN, Disp: 25 tablet, Rfl: 3   simvastatin (ZOCOR) 10 MG tablet, Take 1 tablet (10 mg total) by mouth at bedtime., Disp: 90 tablet, Rfl: 3   Zoledronic Acid (RECLAST IV), as directed. Once yearly, last infusion was November 2022, Disp: , Rfl:    ASSESSMENT AND PLAN: .      ICD-10-CM   1. Angina pectoris (HCC)  I20.9 aspirin (ASPIRIN CHILDRENS) 81  MG chewable tablet    isosorbide mononitrate (IMDUR) 30  MG 24 hr tablet    CBC with Differential/Platelet    Basic metabolic panel    2. Primary hypertension  I10 CBC with Differential/Platelet    Basic metabolic panel    3. Pure hypercholesterolemia  E78.00 CBC with Differential/Platelet    Basic metabolic panel     Assessment and Plan    Angina Pectoris Intermittent chest and neck discomfort, worsened by physical activity and relieved by rest, suggests angina pectoris likely due to coronary artery disease. Partial relief with nitroglycerin and high suspicion for coronary artery blockage warrant direct cardiac catheterization. The procedure, including radial artery access preference, potential groin access, and risks of less than 1% for stroke, myocardial infarction, urgent bypass surgery, or bleeding, was explained. Proceed with cardiac catheterization to assess blockages. Administer nitroglycerin 30 mg once daily for 24-hour coverage. Educate on procedure risks and benefits, and discuss potential groin access if radial approach is not feasible.  I have high suspicion for angina pectoris hence even if the stress test comes negative and she persistent symptoms, she will eventually need a definitive test.  Hence directly proceeding with cardiac catheterization would be most appropriate.  I also started on isosorbide mononitrate 30 mg daily.  Continue amlodipine.  Continue low-dose statins.  Lipids reviewed and well-controlled.  Blood pressure is also well-controlled.  Hyperlipidemia Cholesterol levels are well-controlled with current medication. Continue the prescribed dose to reduce cardiovascular risk.  Chronic Anemia Mild anemia remains well-managed with no acute changes. Monitor hemoglobin levels periodically.  Osteoarthritis Continue Celebrex once daily for arthritis pain management. Take aspirin 81 mg in the morning, waiting one hour before taking Celebrex to minimize bleeding  risk.  General Health Maintenance Aspirin's role in cardiovascular protection is emphasized despite concurrent Celebrex use, to take aspirin first at least an hour to 2 hours before Celebrex use for arthritis. Take aspirin 81 mg daily in the morning.  Goals of Care Discussed and confirmed consent for life support measures if necessary during the cardiac catheterization procedure, patient agrees and she is full code.  Follow-up Nurse to provide dates and instructions for the cardiac catheterization procedure.      Signed,  Yates Decamp, MD, [facility] 05/10/2023, 7:08 PM [facility] [address] [place], [place] [address] Phone: [phone]. Fax:  [phone]

## 2023-05-10 NOTE — H&P (View-Only) (Signed)
 Cardiology Office Note:  .   Date:  05/10/2023  ID:  Jody [name], DOB [birth year], MRN [id number] [facility]: [facility] Providers Cardiologist:  Yates Decamp, MD   History of Present Illness: [name]   Jody [name] is a 83 y.o. 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  In view of advanced age, had opted to do medical therapy and did not order any testing.  Sublingual nitroglycerin was also prescribed.  She now presents for follow-up, states that symptoms persist and has used sublingual nitroglycerin yesterday for chest pain with relief.  Discussed the use of AI scribe software for clinical note transcription with the patient, who gave verbal consent to proceed.  History of Present Illness   Jody [name], a patient with a history of arthritis and anemia, presents with ongoing chest discomfort and neck pain. The discomfort is not associated with any specific activity and is relieved by lying down. She also reports a different sensation during heavy water exercises, particularly when doing sit-ups. Jody [name] has been adhering to her prescribed medications, except for the 81mg  aspirin due to previous advice against its use with her arthritis medication, Celebrex. She has used nitroglycerin once, which seemed to ease her discomfort. [name] is chronic and stable.      Labs   Lab Results  Component Value Date   CHOL 144 05/01/2023   HDL 74 05/01/2023   LDLCALC 54 05/01/2023   TRIG 87 05/01/2023   CHOLHDL 1.9 05/01/2023   Lab Results  Component Value Date   NA 132 (L) 11/15/2021   K 4.4 11/15/2021   CO2 25 11/15/2021   GLUCOSE 109 (H) 11/15/2021   BUN 15 11/15/2021    CREATININE 1.07 (H) 11/15/2021   CALCIUM 10.4 11/15/2021   GFR 52.74 (L) 11/05/2021   EGFR 52 (L) 11/15/2021   GFRNONAA 41 (L) 11/14/2019      Latest Ref Rng & Units 11/15/2021    3:11 PM 11/05/2021   10:27 AM 05/24/2021    2:53 PM  BMP  Glucose 65 - 99 mg/dL 409  91  811   BUN 7 - 25 mg/dL 15  25  19    Creatinine 0.60 - 0.95 mg/dL 9.14  7.82  9.56   BUN/Creat Ratio 6 - 22 (calc) 14   NOT APPLICABLE   Sodium 135 - 146 mmol/L 132  131  133   Potassium 3.5 - 5.3 mmol/L 4.4  4.2  4.6   Chloride 98 - 110 mmol/L 97  95  97   CO2 20 - 32 mmol/L 25  28  26    Calcium 8.6 - 10.4 mg/dL 21.3  9.7  08.6       Latest Ref Rng & Units 11/05/2021   10:27 AM 05/24/2021    2:53 PM 10/28/2020   10:49 AM  CBC  WBC 4.0 - 10.5 K/uL 6.2  6.1  5.6   Hemoglobin 12.0 - 15.0 g/dL 57.8  46.9  62.9   Hematocrit 36.0 - 46.0 % 32.5  33.4  33.8   Platelets 150.0 - 400.0 K/uL 292.0  295  250    Lab Results  Component Value Date   HGBA1C 5.5 11/14/2019    Lab Results  Component  Value Date   TSH 3.20 11/14/2019    Review of Systems  Cardiovascular:  Positive for chest pain. Negative for dyspnea on exertion and leg swelling.   Physical Exam:   VS:  BP 123/69 (BP Location: Left Arm, Patient Position: Sitting, Cuff Size: Normal)   Pulse 69   Resp 16   Ht 5' (1.524 m)   Wt 120 lb 3.2 oz (54.5 kg)   SpO2 90%   BMI 23.47 kg/m    Wt Readings from Last 3 Encounters:  05/10/23 120 lb 3.2 oz (54.5 kg)  04/12/23 121 lb (54.9 kg)  02/27/23 117 lb (53.1 kg)    Physical Exam Neck:     Vascular: No carotid bruit or JVD.  Cardiovascular:     Rate and Rhythm: Normal rate and regular rhythm.     Pulses: Intact distal pulses.     Heart sounds: Normal heart sounds. No murmur heard.    No gallop.  Pulmonary:     Effort: Pulmonary effort is normal.     Breath sounds: Normal breath sounds.  Abdominal:     General: Bowel sounds are normal.     Palpations: Abdomen is soft.  Musculoskeletal:     Right lower  leg: No edema.     Left lower leg: No edema.    Studies Reviewed: .    NA EKG:   EKG 02/27/2023: Normal sinus rhythm Normal ECG When compared with ECG of 16-Mar-2007 02:14, No significant change was found   Medications and allergies    Allergies  Allergen Reactions   Shrimp [Shellfish Allergy] Anaphylaxis    ALL SEAFOOD   Prolia [Denosumab] Other (See Comments)    Chest pain   Percocet [Oxycodone-Acetaminophen]     Confusion     Current Outpatient Medications:    aspirin (ASPIRIN CHILDRENS) 81 MG chewable tablet, Chew 1 tablet (81 mg total) by mouth daily., Disp: , Rfl:    Bacillus Coagulans-Inulin (ALIGN PREBIOTIC-PROBIOTIC PO), , Disp: , Rfl:    Calcium Carbonate-Vit D-Min (CALCIUM 1200 PO), Take 1 tablet by mouth daily., Disp: , Rfl:    celecoxib (CELEBREX) 100 MG capsule, Take 1 capsule (100 mg total) by mouth 2 (two) times daily as needed. (Patient taking differently: Take 100 mg by mouth daily.), Disp: 180 capsule, Rfl: 2   gabapentin (NEURONTIN) 300 MG capsule, Take 300 mg by mouth 3 (three) times daily., Disp: , Rfl:    Iron 66 MG TABS, Take 1 tablet by mouth daily. , Disp: , Rfl:    isosorbide mononitrate (IMDUR) 30 MG 24 hr tablet, Take 1 tablet (30 mg total) by mouth daily., Disp: 30 tablet, Rfl: 1   metoprolol succinate (TOPROL XL) 25 MG 24 hr tablet, Take 1 tablet (25 mg total) by mouth daily., Disp: 30 tablet, Rfl: 2   Multiple Vitamin (MULTIVITAMIN) tablet, Take 1 tablet by mouth daily., Disp: , Rfl:    nitroGLYCERIN (NITROSTAT) 0.4 MG SL tablet, DISSOLVE 1 TABLET UNDER THE TONGUE EVERY 5 MINUTES AS NEEDED FOR CHEST PAIN, Disp: 25 tablet, Rfl: 3   simvastatin (ZOCOR) 10 MG tablet, Take 1 tablet (10 mg total) by mouth at bedtime., Disp: 90 tablet, Rfl: 3   Zoledronic Acid (RECLAST IV), as directed. Once yearly, last infusion was November 2022, Disp: , Rfl:    ASSESSMENT AND PLAN: .      ICD-10-CM   1. Angina pectoris (HCC)  I20.9 aspirin (ASPIRIN CHILDRENS) 81  MG chewable tablet    isosorbide mononitrate (IMDUR) 30  MG 24 hr tablet    CBC with Differential/Platelet    Basic metabolic panel    2. Primary hypertension  I10 CBC with Differential/Platelet    Basic metabolic panel    3. Pure hypercholesterolemia  E78.00 CBC with Differential/Platelet    Basic metabolic panel     Assessment and Plan    Angina Pectoris Intermittent chest and neck discomfort, worsened by physical activity and relieved by rest, suggests angina pectoris likely due to coronary artery disease. Partial relief with nitroglycerin and high suspicion for coronary artery blockage warrant direct cardiac catheterization. The procedure, including radial artery access preference, potential groin access, and risks of less than 1% for stroke, myocardial infarction, urgent bypass surgery, or bleeding, was explained. Proceed with cardiac catheterization to assess blockages. Administer nitroglycerin 30 mg once daily for 24-hour coverage. Educate on procedure risks and benefits, and discuss potential groin access if radial approach is not feasible.  I have high suspicion for angina pectoris hence even if the stress test comes negative and she persistent symptoms, she will eventually need a definitive test.  Hence directly proceeding with cardiac catheterization would be most appropriate.  I also started on isosorbide mononitrate 30 mg daily.  Continue amlodipine.  Continue low-dose statins.  Lipids reviewed and well-controlled.  Blood pressure is also well-controlled.  Hyperlipidemia Cholesterol levels are well-controlled with current medication. Continue the prescribed dose to reduce cardiovascular risk.  Chronic Anemia Mild anemia remains well-managed with no acute changes. Monitor hemoglobin levels periodically.  Osteoarthritis Continue Celebrex once daily for arthritis pain management. Take aspirin 81 mg in the morning, waiting one hour before taking Celebrex to minimize bleeding  risk.  General Health Maintenance Aspirin's role in cardiovascular protection is emphasized despite concurrent Celebrex use, to take aspirin first at least an hour to 2 hours before Celebrex use for arthritis. Take aspirin 81 mg daily in the morning.  Goals of Care Discussed and confirmed consent for life support measures if necessary during the cardiac catheterization procedure, patient agrees and she is full code.  Follow-up Nurse to provide dates and instructions for the cardiac catheterization procedure.      Signed,  Yates Decamp, MD, [facility] 05/10/2023, 7:08 PM [facility] [address] [place], [place] [address] Phone: [phone]. Fax:  [phone]

## 2023-05-11 ENCOUNTER — Telehealth: Payer: Self-pay | Admitting: *Deleted

## 2023-05-11 ENCOUNTER — Encounter: Payer: Medicare Other | Admitting: Rehabilitative and Restorative Service Providers"

## 2023-05-11 NOTE — Telephone Encounter (Signed)
Reviewed procedure instructions with patient.

## 2023-05-11 NOTE — Telephone Encounter (Addendum)
Cardiac Catheterization scheduled at [facility] for: Monday May 15, 2023 9 AM Arrival time [facility] Main Entrance A at: 7 AM  Nothing to eat after midnight prior to procedure, clear liquids until 5 AM day of procedure.  Medication instructions: -Usual morning medications can be taken with sips of water including aspirin 81 mg.  Plan to go home the same day, you will only stay overnight if medically necessary.  You must have responsible adult to drive you home.  Someone must be with you the first 24 hours after you arrive home.  Shellfish allergy--pt does not recall allergic reaction to IV contrast.  Reviewed procedure instructions with patient.

## 2023-05-11 NOTE — Telephone Encounter (Signed)
Pt returning nurse's call. Please advise

## 2023-05-12 ENCOUNTER — Other Ambulatory Visit: Payer: Self-pay

## 2023-05-12 DIAGNOSIS — I209 Angina pectoris, unspecified: Secondary | ICD-10-CM

## 2023-05-12 LAB — BASIC METABOLIC PANEL
BUN/Creatinine Ratio: 23 (ref 12–28)
BUN: 20 mg/dL (ref 8–27)
CO2: 23 mmol/L (ref 20–29)
Calcium: 9.8 mg/dL (ref 8.7–10.3)
Chloride: 103 mmol/L (ref 96–106)
Creatinine, Ser: 0.87 mg/dL (ref 0.57–1.00)
Glucose: 59 mg/dL — ABNORMAL LOW (ref 70–99)
Potassium: 4.3 mmol/L (ref 3.5–5.2)
Sodium: 141 mmol/L (ref 134–144)
eGFR: 66 mL/min/{1.73_m2} (ref >59)

## 2023-05-12 LAB — CBC WITH DIFFERENTIAL/PLATELET
Basophils Absolute: 0 10*3/uL (ref 0.0–0.2)
Basos: 0 %
EOS (ABSOLUTE): 0.2 10*3/uL (ref 0.0–0.4)
Eos: 3 %
Hematocrit: 34.9 % (ref 34.0–46.6)
Hemoglobin: 11.3 g/dL (ref 11.1–15.9)
Immature Grans (Abs): 0 10*3/uL (ref 0.0–0.1)
Immature Granulocytes: 0 %
Lymphocytes Absolute: 2.5 10*3/uL (ref 0.7–3.1)
Lymphs: 35 %
MCH: 30.9 pg (ref 26.6–33.0)
MCHC: 32.4 g/dL (ref 31.5–35.7)
MCV: 95 fL (ref 79–97)
Monocytes Absolute: 0.8 10*3/uL (ref 0.1–0.9)
Monocytes: 11 %
Neutrophils Absolute: 3.7 10*3/uL (ref 1.4–7.0)
Neutrophils: 51 %
Platelets: 240 10*3/uL (ref 150–450)
RBC: 3.66 x10E6/uL — ABNORMAL LOW (ref 3.77–5.28)
RDW: 11.9 % (ref 11.7–15.4)
WBC: 7.3 10*3/uL (ref 3.4–10.8)

## 2023-05-12 MED ORDER — ISOSORBIDE MONONITRATE ER 30 MG PO TB24: 30.0000 mg | ORAL_TABLET | Freq: Every day | ORAL | 3 refills | Status: DC

## 2023-05-14 ENCOUNTER — Encounter: Payer: Self-pay | Admitting: Cardiology

## 2023-05-14 NOTE — Progress Notes (Signed)
Labs are stable for cardiac catheterization.

## 2023-05-15 ENCOUNTER — Other Ambulatory Visit: Payer: Self-pay

## 2023-05-15 ENCOUNTER — Ambulatory Visit (HOSPITAL_COMMUNITY)
Admission: RE | Admit: 2023-05-15 | Discharge: 2023-05-15 | Disposition: A | Discharge status: Home / Self Care | Payer: Medicare Other | Attending: Cardiology | Admitting: Cardiology

## 2023-05-15 ENCOUNTER — Encounter (HOSPITAL_COMMUNITY)
Admission: RE | Disposition: A | Discharge status: Home / Self Care | Payer: Self-pay | Source: Home / Self Care | Attending: Cardiology

## 2023-05-15 DIAGNOSIS — E538 Deficiency of other specified B group vitamins: Secondary | ICD-10-CM | POA: Diagnosis not present

## 2023-05-15 DIAGNOSIS — I10 Essential (primary) hypertension: Secondary | ICD-10-CM | POA: Insufficient documentation

## 2023-05-15 DIAGNOSIS — G2581 Restless legs syndrome: Secondary | ICD-10-CM | POA: Diagnosis not present

## 2023-05-15 DIAGNOSIS — G629 Polyneuropathy, unspecified: Secondary | ICD-10-CM | POA: Diagnosis not present

## 2023-05-15 DIAGNOSIS — E7800 Pure hypercholesterolemia, unspecified: Secondary | ICD-10-CM | POA: Insufficient documentation

## 2023-05-15 DIAGNOSIS — M1990 Unspecified osteoarthritis, unspecified site: Secondary | ICD-10-CM | POA: Insufficient documentation

## 2023-05-15 DIAGNOSIS — Z7982 Long term (current) use of aspirin: Secondary | ICD-10-CM | POA: Insufficient documentation

## 2023-05-15 DIAGNOSIS — I209 Angina pectoris, unspecified: Secondary | ICD-10-CM | POA: Diagnosis not present

## 2023-05-15 DIAGNOSIS — Z79899 Other long term (current) drug therapy: Secondary | ICD-10-CM | POA: Diagnosis not present

## 2023-05-15 DIAGNOSIS — D649 Anemia, unspecified: Secondary | ICD-10-CM | POA: Insufficient documentation

## 2023-05-15 DIAGNOSIS — M542 Cervicalgia: Secondary | ICD-10-CM | POA: Diagnosis not present

## 2023-05-15 DIAGNOSIS — R079 Chest pain, unspecified: Secondary | ICD-10-CM

## 2023-05-15 HISTORY — PX: LEFT HEART CATH AND CORONARY ANGIOGRAPHY: CATH118249

## 2023-05-15 SURGERY — LEFT HEART CATH AND CORONARY ANGIOGRAPHY
Anesthesia: LOCAL

## 2023-05-15 MED ORDER — HEPARIN SODIUM (PORCINE) 1000 UNIT/ML IJ SOLN
INTRAMUSCULAR | Status: DC | PRN
  Administered 2023-05-15: 3000 [IU] via INTRAVENOUS

## 2023-05-15 MED ORDER — VERAPAMIL HCL 2.5 MG/ML IV SOLN
INTRAVENOUS | Status: DC | PRN
  Administered 2023-05-15: 10 mL via INTRA_ARTERIAL

## 2023-05-15 MED ORDER — VERAPAMIL HCL 2.5 MG/ML IV SOLN
INTRAVENOUS | Status: AC
  Filled 2023-05-15: qty 2

## 2023-05-15 MED ORDER — SODIUM CHLORIDE 0.9 % IV SOLN: 250.0000 mL | INTRAVENOUS | Status: DC | PRN

## 2023-05-15 MED ORDER — HEPARIN SODIUM (PORCINE) 1000 UNIT/ML IJ SOLN
INTRAMUSCULAR | Status: AC
Start: 2023-05-15 — End: ?
  Filled 2023-05-15: qty 10

## 2023-05-15 MED ORDER — ASPIRIN 81 MG PO CHEW: 81.0000 mg | CHEWABLE_TABLET | ORAL | Status: DC

## 2023-05-15 MED ORDER — LIDOCAINE HCL (PF) 1 % IJ SOLN
INTRAMUSCULAR | Status: AC
  Filled 2023-05-15: qty 30

## 2023-05-15 MED ORDER — LIDOCAINE HCL (PF) 1 % IJ SOLN
INTRAMUSCULAR | Status: DC | PRN
  Administered 2023-05-15: 2 mL

## 2023-05-15 MED ORDER — FENTANYL CITRATE (PF) 100 MCG/2ML IJ SOLN
INTRAMUSCULAR | Status: DC | PRN
  Administered 2023-05-15: 25 ug via INTRAVENOUS

## 2023-05-15 MED ORDER — SODIUM CHLORIDE 0.9% FLUSH: 3.0000 mL | INTRAVENOUS | Status: DC | PRN

## 2023-05-15 MED ORDER — MIDAZOLAM HCL 2 MG/2ML IJ SOLN
INTRAMUSCULAR | Status: AC
  Filled 2023-05-15: qty 2

## 2023-05-15 MED ORDER — HEPARIN (PORCINE) IN NACL 1000-0.9 UT/500ML-% IV SOLN
INTRAVENOUS | Status: DC | PRN
  Administered 2023-05-15 (×2): 500 mL

## 2023-05-15 MED ORDER — ONDANSETRON HCL 4 MG/2ML IJ SOLN: 4.0000 mg | Freq: Four times a day (QID) | INTRAMUSCULAR | Status: DC | PRN

## 2023-05-15 MED ORDER — FENTANYL CITRATE (PF) 100 MCG/2ML IJ SOLN
INTRAMUSCULAR | Status: AC
  Filled 2023-05-15: qty 2

## 2023-05-15 MED ORDER — SODIUM CHLORIDE 0.9 % WEIGHT BASED INFUSION: 3.0000 mL/kg/h | INTRAVENOUS | Status: AC

## 2023-05-15 MED ORDER — IOHEXOL 350 MG/ML SOLN
INTRAVENOUS | Status: DC | PRN
Start: 2023-05-15 — End: 2023-05-15
  Administered 2023-05-15: 17 mL

## 2023-05-15 MED ORDER — SODIUM CHLORIDE 0.9 % WEIGHT BASED INFUSION: 1.0000 mL/kg/h | INTRAVENOUS | Status: DC

## 2023-05-15 MED ORDER — MIDAZOLAM HCL 2 MG/2ML IJ SOLN
INTRAMUSCULAR | Status: DC | PRN
  Administered 2023-05-15: 1 mg via INTRAVENOUS

## 2023-05-15 SURGICAL SUPPLY — 8 items
CATH INFINITI AMBI 5FR TG (CATHETERS) IMPLANT
DEVICE RAD COMP TR BAND LRG (VASCULAR PRODUCTS) IMPLANT
GLIDESHEATH SLEND A-KIT 6F 22G (SHEATH) IMPLANT
GUIDEWIRE INQWIRE 1.5J.035X260 (WIRE) IMPLANT
INQWIRE 1.5J .035X260CM (WIRE) ×1
KIT SINGLE USE MANIFOLD (KITS) IMPLANT
PACK CARDIAC CATHETERIZATION (CUSTOM PROCEDURE TRAY) ×1 IMPLANT
SET ATX-X65L (MISCELLANEOUS) IMPLANT

## 2023-05-15 NOTE — Interval H&P Note (Signed)
History and Physical Interval Note:  05/15/2023 9:02 AM  Jody [name]  has presented today for surgery, with the diagnosis of chest pain.  The various methods of treatment have been discussed with the patient and family. After consideration of risks, benefits and other options for treatment, the patient has consented to  Procedure(s): LEFT HEART CATH AND CORONARY ANGIOGRAPHY (N/A) and possible coronary angioplasty as a surgical intervention.  The patient's history has been reviewed, patient examined, no change in status, stable for surgery.  I have reviewed the patient's chart and labs.  Questions were answered to the patient's satisfaction.   Cath Lab Visit (complete for each Cath Lab visit)  Clinical Evaluation Leading to the Procedure:   ACS: No.  Non-ACS:    Anginal Classification: CCS III  Anti-ischemic medical therapy: Maximal Therapy (2 or more classes of medications)  Non-Invasive Test Results: No non-invasive testing performed  Prior CABG: No previous CABG   Jody [name]

## 2023-05-15 NOTE — Discharge Instructions (Signed)

## 2023-05-15 NOTE — Progress Notes (Signed)
TR band removed at 1135, gauze dressing applied. Right radial noticed to have bleeding, pressure held for 15 mins. Right radial level 0, clean, dry, and intact. Patient discharged home.

## 2023-05-16 ENCOUNTER — Encounter (HOSPITAL_COMMUNITY): Payer: Self-pay | Admitting: Cardiology

## 2023-05-25 ENCOUNTER — Encounter: Payer: Medicare Other | Admitting: Rehabilitative and Restorative Service Providers"

## 2023-05-25 ENCOUNTER — Other Ambulatory Visit: Payer: Self-pay | Admitting: Cardiology

## 2023-05-25 DIAGNOSIS — I209 Angina pectoris, unspecified: Secondary | ICD-10-CM

## 2023-05-30 ENCOUNTER — Encounter: Payer: Self-pay | Admitting: Rehabilitative and Restorative Service Providers"

## 2023-05-30 ENCOUNTER — Ambulatory Visit (INDEPENDENT_AMBULATORY_CARE_PROVIDER_SITE_OTHER): Payer: Medicare Other | Admitting: Rehabilitative and Restorative Service Providers"

## 2023-05-30 DIAGNOSIS — M25511 Pain in right shoulder: Secondary | ICD-10-CM

## 2023-05-30 DIAGNOSIS — M6281 Muscle weakness (generalized): Secondary | ICD-10-CM | POA: Diagnosis not present

## 2023-05-30 DIAGNOSIS — R262 Difficulty in walking, not elsewhere classified: Secondary | ICD-10-CM

## 2023-05-30 DIAGNOSIS — M79605 Pain in left leg: Secondary | ICD-10-CM

## 2023-05-30 DIAGNOSIS — R293 Abnormal posture: Secondary | ICD-10-CM

## 2023-05-30 DIAGNOSIS — G8929 Other chronic pain: Secondary | ICD-10-CM

## 2023-05-30 NOTE — Therapy (Signed)
 OUTPATIENT PHYSICAL THERAPY  TREATMENT   Patient Name: Jody [name] MRN: [id number] DOB:04-22-1940, 83 y.o., female Today's Date: 05/30/2023  END OF SESSION:  PT End of Session - 05/30/23 1010     Visit Number 2    Number of Visits 20    Date for PT Re-Evaluation 07/17/23    Authorization Type Medicare and [employer] for life    Progress Note Due on Visit 10    PT Start Time 1010    PT Stop Time 1051    PT Time Calculation (min) 41 min    Activity Tolerance Patient limited by pain    Behavior During Therapy WFL for tasks assessed/performed              Past Medical History:  Diagnosis Date   Anemia, unspecified    on meds   B12 deficiency due to diet    Basal cell carcinoma    LEFT UPPER ARM SUP. TX=CX3 5FU   Cataract    bilateral sx   Cramp of limb    Edema of both legs    Encounter for long-term (current) use of other medications    Hypertension    on meds   Lumbago    Melanoma (HCC) 10/18/2010   RIGHT POST SHOULDER =MOHS   Obesity    Osteoarthrosis, unspecified whether generalized or localized, unspecified site    generalized   Restless legs syndrome (RLS)    SCC (squamous cell carcinoma) 07/03/2017   RIGHT CHEST CX3 5FU   SCC (squamous cell carcinoma) 01/28/2019   LEFT FOREARM CX3 5FU   SCC (squamous cell carcinoma) [id number]   RIGHT FOREHEAD CX3 5FU   SCC (squamous cell carcinoma) 07/03/2017   RIGHT FOREARM CX3 5FU   SCCA (squamous cell carcinoma) of skin 04/08/2020   Right Anterior Mandible (in situ)(CX35FU)   SCCA (squamous cell carcinoma) of skin 04/08/2020   Left Supraorbital Region (Keratoacanthoma)   Senile osteoporosis    Squamous cell carcinoma of skin 01/30/2017   RIGHT JAWLINE TX WITH BX   Past Surgical History:  Procedure Laterality Date   CATARACT EXTRACTION W/ INTRAOCULAR LENS  IMPLANT, BILATERAL  04/2014   COLONOSCOPY     EYE SURGERY Right 04/28/2016   Macular   LEFT HEART CATH AND CORONARY ANGIOGRAPHY N/A 05/15/2023   Procedure:  LEFT HEART CATH AND CORONARY ANGIOGRAPHY;  Surgeon: Yates Decamp, MD;  Location: [facility];  Service: Cardiovascular;  Laterality: N/A;   SPINE SURGERY  2005   STOMACH SURGERY  1981   stapleing    TONSILLECTOMY     UPPER GASTROINTESTINAL ENDOSCOPY     VITRECTOMY Right    with membrane peeling for a macular pucker   Patient Active Problem List   Diagnosis Date Noted   Primary osteoarthritis of left knee 09/23/2021   Pure hypercholesterolemia 11/14/2019   Hyperglycemia 11/14/2019   Decreased visual acuity 11/14/2019   Other fatigue 11/14/2019   Positional lightheadedness 11/14/2019   Gastroesophageal reflux disease 02/04/2019   BMI 32.0-32.9,adult 02/12/2018   Obesity (BMI 30.0-34.9) 07/17/2017   Impingement syndrome of right shoulder 07/04/2016   Chronic pain of both knees 07/04/2016   Lumbar back pain with radiculopathy affecting left lower extremity 04/11/2016   Displaced transverse fracture of left patella, subsequent encounter for closed fracture with delayed healing 02/03/2016   Finger fracture, left 02/03/2016   Leg cramps, sleep related 11/19/2015   Cervical spondylosis without myelopathy 06/08/2015   Osteoarthritis of finger of right hand 06/08/2015  Absolute anemia 05/16/2014   Primary osteoarthritis involving multiple joints 05/16/2014   Cataract cortical, senile 05/16/2014   Essential hypertension 09/03/2012   Edema of both legs    Senile osteoporosis    B12 deficiency due to diet     PCP: [facility]  REFERRING PROVIDER: Madelyn Brunner, DO  REFERRING DIAG: [phone] (ICD-10-CM) - Pain in left buttock M25.511,G89.29 (ICD-10-CM) - Chronic right shoulder pain M75.101 (ICD-10-CM) - Tear of right supraspinatus tendon M12.811 (ICD-10-CM) - Rotator cuff arthropathy of right shoulder S76.302D (ICD-10-CM) - Hamstring injury, left, subsequent encounter  THERAPY DIAG:  Chronic right shoulder pain  Pain in left leg  Muscle weakness  (generalized)  Difficulty in walking, not elsewhere classified  Abnormal posture  Rationale for Evaluation and Treatment: Rehabilitation  ONSET DATE: Fall 2024  SUBJECTIVE:                                                                                                                                                                                      SUBJECTIVE STATEMENT: Pt indicated the hip has felt much better.  Pt indicated shoulder and Lt hand has been hurting.  Pt indicated shoulder isn't better but not sure if its any worse.     Pt indicated doing some of the exercise with some easier than other    PERTINENT HISTORY: Med hx:  Anemia, history of carcinoma, edema LE, HTN, low back pain, OA, osteoporosis per chart review  PAIN:  NPRS scale:  current for shoulder:   Pain location: Rt shoulder (Lt shoulder at times).  Lt hip/buttock Pain description: tightness, achy Aggravating factors: Rt shoulder:  reaching/lifting, use of cane, reaching back, some resistance in water.    Lt hip/buttock  certain movements cause snapping of tendon, walking/bending.  Relieving factors: medicine, water activity/movement  PRECAUTIONS: None  WEIGHT BEARING RESTRICTIONS: No  FALLS:  Has patient fallen in last 6 months? No  LIVING ENVIRONMENT: Lives with: lives with their family Lives in: House/apartment Stairs: 1 step from carport to kitchen no railing.  Has following equipment at home: cane, walker. - cane in public  2 cats at home.   OCCUPATION: Retired  PLOF: Independent, water exercise.  Rt hand dominant.   PATIENT GOALS: Reduce pain, keep activity and maybe improve balance.    OBJECTIVE:   PATIENT SURVEYS:  05/08/2023 FOTO intake:   42  predicted:  44  COGNITION: 05/08/2023 Overall cognitive status: WFL     SENSATION: 05/08/2023 Not tested  POSTURE: 05/08/2023 Mild rounded shoulders, increased thoracic kyphosis, weight shift off Lt to Rt mild in standing.   UPPER  EXTREMITY ROM:   ROM Right 05/08/2023 Left 05/08/2023 Right  05/30/2023  Shoulder flexion 90 deg AROM against gravity  140 AROM in supine    WFL  95 deg AROM against gravity  Shoulder extension     Shoulder abduction     Shoulder adduction     Shoulder internal rotation 45 in 45 deg abduction    Shoulder external rotation 60 in 45 deg abduction    Elbow flexion     Elbow extension                                   (Blank rows = not tested)   LUMBAR ROM:   ROM AROM  05/08/2023 AROM 05/30/2023  Flexion To mid shin with pulling noted   Extension 50 % WFL  75% WFL  Right lateral flexion    Left lateral flexion    Right rotation    Left rotation     (Blank rows = not tested)  LOWER EXTREMITY ROM:      Right 05/08/2023 Left 05/08/2023  Hip flexion [facility]  Hip extension    Hip abduction    Hip adduction    Hip internal rotation    Hip external rotation    Knee flexion    Knee extension    Ankle dorsiflexion    Ankle plantarflexion    Ankle inversion    Ankle eversion     (Blank rows = not tested)   UPPER EXTREMITY MMT:  MMT Right 05/08/2023 Left 05/08/2023  Shoulder flexion 2+/5 4/5  Shoulder extension    Shoulder abduction 2+/5 4/5  Shoulder adduction    Shoulder internal rotation 4/5 4/5  Shoulder external rotation 4/5 4/5  Middle trapezius    Lower trapezius    Elbow flexion    Elbow extension    Wrist flexion    Wrist extension    Wrist ulnar deviation    Wrist radial deviation    Wrist pronation    Wrist supination    Grip strength (lbs)    (Blank rows = not tested)  LOWER EXTREMITY MMT:    MMT Right 05/08/2023 Left 05/08/2023  Hip flexion 4/5 4/5  Hip extension 3+/5 3+/5  Hip abduction 3/5 3/5  Hip adduction    Hip internal rotation    Hip external rotation    Knee flexion    Knee extension 4/5 4/5  Ankle dorsiflexion 5/5 5/5  Ankle plantarflexion    Ankle inversion    Ankle eversion     (Blank rows = not tested)   SPECIAL  TESTS: 05/08/2023 Rt shoulder painful arc, shrug in elevation.    JOINT MOBILITY TESTING:  05/08/2023 Crepitus noted in bilateral shoulder, knee bilateral  PALPATION:  05/08/2023 Tenderness Lt ischial tuberosity.  Trigger points noted in Rt infraspinatus, glute max/med.   GAIT Cane use in Rt UE c reduced stance on Lt, reduced step length with Rt leg.  TODAY'S TREATMENT:                                                                                                       DATE:  05/30/2023 Therex: Nustep Lvl 5 UE/LE 4 mins, LE only 4 mins (limited by Rt shoulder pain.  Supine lumbar trunk rotation stretch 15 sec x 5 bilateral  Standing lumbar extension x 5 with cues for head positioning to avoid vestibular challenge Supine hooklying clam shell blue c isometric contralateral hold x 20 bilateral  Supine bridge c blue band hooklying hip abduction hold 2 x 10 2-3 sec hold.     Reviewed HEP to ensure good techniques and procedures for gains but limiting pain symptoms.  Discussed and review adaption of aquatic activity to address pain responses.   Neuro Re-ed (postural awareness and muscle activation) Seated scapular retraction 3 sec hold x 10  Seated Rt shoulder ER isometric with clinican resistance 5 sec hold x 6 with cues for pain free    TODAY'S TREATMENT:                                                                                                       DATE: 05/08/2023 Therex:    HEP instruction/performance c cues for techniques, handout provided.  Trial set performed of each for comprehension and symptom assessment.  See below for exercise list Education provided to continue exercise routine in pool as able without pain increase.  Discussed pain monitoring scale for pain amounts to adjust to.       PATIENT EDUCATION: 05/08/2023 Education details: HEP, POC Person educated: Patient Education method: Explanation, Demonstration, Verbal cues, and Handouts Education comprehension: verbalized understanding, returned demonstration, and verbal cues required  HOME EXERCISE PROGRAM: Access Code: [email or web address] URL: [email or web address] Date: 05/08/2023 Prepared by: Chyrel Masson  Exercises - Seated Scapular Retraction  - 3-5 x daily - 7 x weekly - 1 sets - 5-10 reps - 2 hold - Standing Lumbar Extension with Counter  - 3-5 x daily - 7 x weekly - 1 sets - 5-10 reps - 2 hold - Supine Bridge with Resistance Band  - 1-2 x daily - 7 x weekly - 1-2 sets - 10-15 reps - 2 hold - Hooklying Clamshell with Resistance  - 1-2 x daily - 7 x weekly - 1-2 sets - 10-15 reps - 2 hold - Standing Isometric Shoulder External Rotation with Doorway  - 1-2 x daily - 7 x weekly - 1 sets - 10 reps - 5 hold  ASSESSMENT:  CLINICAL IMPRESSION: Spent plenty of time in review of HEP to ensure pain free activity.  Pt was able to perform all  HEP in clinic today without pain increase.  Hip symptoms were noted as improving.  Continued skilled PT services indicated at this time to continue to address complaints and make gains towards    Suggested possible OT referral for hand complaints.   OBJECTIVE IMPAIRMENTS: Abnormal gait, decreased activity tolerance, decreased balance, decreased coordination, decreased endurance, decreased mobility, difficulty walking, decreased ROM, decreased strength, increased fascial restrictions, impaired perceived functional ability, increased muscle spasms, impaired flexibility, impaired UE functional use, improper body mechanics, postural dysfunction, and pain.   ACTIVITY LIMITATIONS: carrying, lifting, bending, sitting, standing, squatting, sleeping, stairs, transfers, bed mobility, and locomotion level  PARTICIPATION LIMITATIONS: meal prep, cleaning, laundry, interpersonal  relationship, driving, shopping, and community activity  PERSONAL FACTORS: Med hx:  Anemia, history of carcinoma, edema LE, HTN, low back pain, OA, osteoporosis per chart review are also affecting patient's functional outcome.   REHAB POTENTIAL: Good  CLINICAL DECISION MAKING: Evolving/moderate complexity  EVALUATION COMPLEXITY: Moderate   GOALS: Goals reviewed with patient? Yes  SHORT TERM GOALS: (target date for Short term goals are 3 weeks 05/29/2023)  1.Patient will demonstrate independent use of home exercise program to maintain progress from in clinic treatments. Goal status: Partially met, cues needed today.   LONG TERM GOALS: (target dates for all long term goals are 10 weeks  07/17/2023 )   1. Patient will demonstrate/report pain at worst less than or equal to 2/10 to facilitate minimal limitation in daily activity secondary to pain symptoms. Goal status: New   2. Patient will demonstrate independent use of home exercise program to facilitate ability to maintain/progress functional gains from skilled physical therapy services. Goal status: New   3. Patient will demonstrate FOTO outcome > or = 44 % to indicate reduced disability due to condition. Goal status: New   4.  Patient will demonstrate Rt UE MMT 4/5 throughout to facilitate lifting, reaching, carrying at [facility] in daily activity.   Goal status: New   5.  Patient will demonstrate Rt GH joint AROM WFL s symptoms to facilitate usual overhead reaching, self care, dressing at PLOF.    Goal status: New   6.  Patient bilateral hip MMT abduction 4/5, hip extension and flexion 5/5 to facilitate ambulation, stairs and other daily mobility s restriction.  Goal status: New   7.  Patient will demonstrate SLS > 10 seconds bilateral for stability improvement in ambulation.  Goal Status: New  PLAN:  PT FREQUENCY: 1-2x/week  PT DURATION: 10 weeks  PLANNED INTERVENTIONS: Can include 16109- PT Re-evaluation,  97110-Therapeutic exercises, 97530- Therapeutic activity, [id number]- Neuromuscular re-education, 97535- Self Care, 97140- Manual therapy, [phone]- Gait training, [phone]- Orthotic Fit/training, [phone]- Canalith repositioning, [id number]- Aquatic Therapy, 97014- Electrical stimulation (unattended), [id number]- Electrical stimulation (manual), [id number] Physical performance testing, 97016- Vasopneumatic device, [id number]- Ultrasound, [id number]- Traction (mechanical), [id number]- Ionotophoresis 4mg /ml Dexamethasone, Patient/Family education, Balance training, Stair training, Taping, Dry Needling, Joint mobilization, Joint manipulation, Spinal manipulation, Spinal mobilization, Scar mobilization, Vestibular training, Visual/preceptual remediation/compensation, DME instructions, Cryotherapy, and Moist heat.  All performed as medically necessary.  All included unless contraindicated  PLAN FOR NEXT SESSION: AAROM in gravity reduced positioning for Rt shoulder possible.    Chyrel Masson, PT, DPT, OCS, ATC 05/30/23  10:49 AM

## 2023-05-31 ENCOUNTER — Encounter: Payer: Self-pay | Admitting: Sports Medicine

## 2023-06-01 ENCOUNTER — Encounter: Payer: Medicare Other | Admitting: Rehabilitative and Restorative Service Providers"

## 2023-06-06 ENCOUNTER — Encounter: Payer: Self-pay | Admitting: Rehabilitative and Restorative Service Providers"

## 2023-06-06 ENCOUNTER — Ambulatory Visit (INDEPENDENT_AMBULATORY_CARE_PROVIDER_SITE_OTHER): Payer: Medicare Other | Admitting: Rehabilitative and Restorative Service Providers"

## 2023-06-06 DIAGNOSIS — M6281 Muscle weakness (generalized): Secondary | ICD-10-CM

## 2023-06-06 DIAGNOSIS — M79605 Pain in left leg: Secondary | ICD-10-CM

## 2023-06-06 DIAGNOSIS — M25511 Pain in right shoulder: Secondary | ICD-10-CM

## 2023-06-06 DIAGNOSIS — G8929 Other chronic pain: Secondary | ICD-10-CM

## 2023-06-06 DIAGNOSIS — R262 Difficulty in walking, not elsewhere classified: Secondary | ICD-10-CM | POA: Diagnosis not present

## 2023-06-06 DIAGNOSIS — R293 Abnormal posture: Secondary | ICD-10-CM

## 2023-06-06 NOTE — Therapy (Signed)
 OUTPATIENT PHYSICAL THERAPY  TREATMENT   Patient Name: Jody [name] MRN: [id number] DOB:Jun 20, 1940, 83 y.o., female Today's Date: 06/06/2023  END OF SESSION:  PT End of Session - 06/06/23 1019     Visit Number 3    Number of Visits 20    Date for PT Re-Evaluation 07/17/23    Authorization Type Medicare and [employer] for life    Progress Note Due on Visit 10    PT Start Time 1012    PT Stop Time 1054    PT Time Calculation (min) 42 min    Activity Tolerance Patient tolerated treatment well    Behavior During Therapy WFL for tasks assessed/performed               Past Medical History:  Diagnosis Date   Anemia, unspecified    on meds   B12 deficiency due to diet    Basal cell carcinoma    LEFT UPPER ARM SUP. TX=CX3 5FU   Cataract    bilateral sx   Cramp of limb    Edema of both legs    Encounter for long-term (current) use of other medications    Hypertension    on meds   Lumbago    Melanoma (HCC) 10/18/2010   RIGHT POST SHOULDER =MOHS   Obesity    Osteoarthrosis, unspecified whether generalized or localized, unspecified site    generalized   Restless legs syndrome (RLS)    SCC (squamous cell carcinoma) 07/03/2017   RIGHT CHEST CX3 5FU   SCC (squamous cell carcinoma) 01/28/2019   LEFT FOREARM CX3 5FU   SCC (squamous cell carcinoma) [id number]   RIGHT FOREHEAD CX3 5FU   SCC (squamous cell carcinoma) 07/03/2017   RIGHT FOREARM CX3 5FU   SCCA (squamous cell carcinoma) of skin 04/08/2020   Right Anterior Mandible (in situ)(CX35FU)   SCCA (squamous cell carcinoma) of skin 04/08/2020   Left Supraorbital Region (Keratoacanthoma)   Senile osteoporosis    Squamous cell carcinoma of skin 01/30/2017   RIGHT JAWLINE TX WITH BX   Past Surgical History:  Procedure Laterality Date   CATARACT EXTRACTION W/ INTRAOCULAR LENS  IMPLANT, BILATERAL  04/2014   COLONOSCOPY     EYE SURGERY Right 04/28/2016   Macular   LEFT HEART CATH AND CORONARY ANGIOGRAPHY N/A 05/15/2023    Procedure: LEFT HEART CATH AND CORONARY ANGIOGRAPHY;  Surgeon: Yates Decamp, MD;  Location: [facility];  Service: Cardiovascular;  Laterality: N/A;   SPINE SURGERY  2005   STOMACH SURGERY  1981   stapleing    TONSILLECTOMY     UPPER GASTROINTESTINAL ENDOSCOPY     VITRECTOMY Right    with membrane peeling for a macular pucker   Patient Active Problem List   Diagnosis Date Noted   Primary osteoarthritis of left knee 09/23/2021   Pure hypercholesterolemia 11/14/2019   Hyperglycemia 11/14/2019   Decreased visual acuity 11/14/2019   Other fatigue 11/14/2019   Positional lightheadedness 11/14/2019   Gastroesophageal reflux disease 02/04/2019   BMI 32.0-32.9,adult 02/12/2018   Obesity (BMI 30.0-34.9) 07/17/2017   Impingement syndrome of right shoulder 07/04/2016   Chronic pain of both knees 07/04/2016   Lumbar back pain with radiculopathy affecting left lower extremity 04/11/2016   Displaced transverse fracture of left patella, subsequent encounter for closed fracture with delayed healing 02/03/2016   Finger fracture, left 02/03/2016   Leg cramps, sleep related 11/19/2015   Cervical spondylosis without myelopathy 06/08/2015   Osteoarthritis of finger of right hand 06/08/2015  Absolute anemia 05/16/2014   Primary osteoarthritis involving multiple joints 05/16/2014   Cataract cortical, senile 05/16/2014   Essential hypertension 09/03/2012   Edema of both legs    Senile osteoporosis    B12 deficiency due to diet     PCP: [facility]  REFERRING PROVIDER: Madelyn Brunner, DO  REFERRING DIAG: [phone] (ICD-10-CM) - Pain in left buttock M25.511,G89.29 (ICD-10-CM) - Chronic right shoulder pain M75.101 (ICD-10-CM) - Tear of right supraspinatus tendon M12.811 (ICD-10-CM) - Rotator cuff arthropathy of right shoulder S76.302D (ICD-10-CM) - Hamstring injury, left, subsequent encounter  THERAPY DIAG:  Chronic right shoulder pain  Pain in left leg  Muscle weakness  (generalized)  Difficulty in walking, not elsewhere classified  Abnormal posture  Rationale for Evaluation and Treatment: Rehabilitation  ONSET DATE: Fall 2024  SUBJECTIVE:                                                                                                                                                                                      SUBJECTIVE STATEMENT: Pt indicated doing better since last time.  Pt indicated going back to the pool and also doing HEP.   Pt indicated has an appointment for Dr. Shon Baton for carpal tunnel/shockwave.     PERTINENT HISTORY: Med hx:  Anemia, history of carcinoma, edema LE, HTN, low back pain, OA, osteoporosis per chart review  PAIN:  NPRS scale:  this morning 5/10 Pain location: Rt shoulder (Lt shoulder at times).  Lt hip/buttock Pain description: tightness, achy Aggravating factors: Rt shoulder:  reaching/lifting, use of cane, reaching back, some resistance in water.    Lt hip/buttock  certain movements cause snapping of tendon, walking/bending.  Relieving factors: medicine, water activity/movement  PRECAUTIONS: None  WEIGHT BEARING RESTRICTIONS: No  FALLS:  Has patient fallen in last 6 months? No  LIVING ENVIRONMENT: Lives with: lives with their family Lives in: House/apartment Stairs: 1 step from carport to kitchen no railing.  Has following equipment at home: cane, walker. - cane in public  2 cats at home.   OCCUPATION: Retired  PLOF: Independent, water exercise.  Rt hand dominant.   PATIENT GOALS: Reduce pain, keep activity and maybe improve balance.    OBJECTIVE:   PATIENT SURVEYS:  05/08/2023 FOTO intake:   42  predicted:  44  COGNITION: 05/08/2023 Overall cognitive status: WFL     SENSATION: 05/08/2023 Not tested  POSTURE: 05/08/2023 Mild rounded shoulders, increased thoracic kyphosis, weight shift off Lt to Rt mild in standing.   UPPER EXTREMITY ROM:   ROM Right 05/08/2023 Left 05/08/2023  Right 05/30/2023 Right 06/06/2023  Shoulder flexion 90 deg AROM against gravity  140 AROM in supine  WFL  95 deg AROM against gravity 125 AROM against gravity  Shoulder extension      Shoulder abduction      Shoulder adduction      Shoulder internal rotation 45 in 45 deg abduction     Shoulder external rotation 60 in 45 deg abduction     Elbow flexion      Elbow extension                                          (Blank rows = not tested)   LUMBAR ROM:   ROM AROM  05/08/2023 AROM 05/30/2023  Flexion To mid shin with pulling noted   Extension 50 % WFL  75% WFL  Right lateral flexion    Left lateral flexion    Right rotation    Left rotation     (Blank rows = not tested)  LOWER EXTREMITY ROM:      Right 05/08/2023 Left 05/08/2023  Hip flexion [facility]  Hip extension    Hip abduction    Hip adduction    Hip internal rotation    Hip external rotation    Knee flexion    Knee extension    Ankle dorsiflexion    Ankle plantarflexion    Ankle inversion    Ankle eversion     (Blank rows = not tested)   UPPER EXTREMITY MMT:  MMT Right 05/08/2023 Left 05/08/2023  Shoulder flexion 2+/5 4/5  Shoulder extension    Shoulder abduction 2+/5 4/5  Shoulder adduction    Shoulder internal rotation 4/5 4/5  Shoulder external rotation 4/5 4/5  Middle trapezius    Lower trapezius    Elbow flexion    Elbow extension    Wrist flexion    Wrist extension    Wrist ulnar deviation    Wrist radial deviation    Wrist pronation    Wrist supination    Grip strength (lbs)    (Blank rows = not tested)  LOWER EXTREMITY MMT:    MMT Right 05/08/2023 Left 05/08/2023  Hip flexion 4/5 4/5  Hip extension 3+/5 3+/5  Hip abduction 3/5 3/5  Hip adduction    Hip internal rotation    Hip external rotation    Knee flexion    Knee extension 4/5 4/5  Ankle dorsiflexion 5/5 5/5  Ankle plantarflexion    Ankle inversion    Ankle eversion     (Blank rows = not tested)   SPECIAL  TESTS: 05/08/2023 Rt shoulder painful arc, shrug in elevation.    JOINT MOBILITY TESTING:  05/08/2023 Crepitus noted in bilateral shoulder, knee bilateral  PALPATION:  05/08/2023 Tenderness Lt ischial tuberosity.  Trigger points noted in Rt infraspinatus, glute max/med.   GAIT Cane use in Rt UE c reduced stance on Lt, reduced step length with Rt leg.  TODAY'S TREATMENT:                                                                                                       DATE:  06/06/2023 Therex: Nustep Lvl 5 LE 8 mins  Review of existing HEP c verbal cues.  Handout given for update.   Neuro Re-ed (postural awareness and muscle activation) Seated scapular retraction 3 sec hold x 10  Seated tband rows red 2 x 10 c scap retraction focus   TherActivity Seated AAROM cane shoulder flexion with focus on Rt arm lowering control x 12 with cues for home use.  To improve functional reach Sit to stand to sit s UE assist x 10 c cues for positioning 21 inch height.     TODAY'S TREATMENT:                                                                                                       DATE:  05/30/2023 Therex: Nustep Lvl 5 UE/LE 4 mins, LE only 4 mins (limited by Rt shoulder pain.  Supine lumbar trunk rotation stretch 15 sec x 5 bilateral  Standing lumbar extension x 5 with cues for head positioning to avoid vestibular challenge Supine hooklying clam shell blue c isometric contralateral hold x 20 bilateral  Supine bridge c blue band hooklying hip abduction hold 2 x 10 2-3 sec hold.     Reviewed HEP to ensure good techniques and procedures for gains but limiting pain symptoms.  Discussed and review adaption of aquatic activity to address pain responses.   Neuro Re-ed (postural awareness and muscle  activation) Seated scapular retraction 3 sec hold x 10  Seated Rt shoulder ER isometric with clinican resistance 5 sec hold x 6 with cues for pain free    TODAY'S TREATMENT:                                                                                                       DATE: 05/08/2023 Therex:    HEP instruction/performance c cues for techniques, handout provided.  Trial set performed of each for comprehension and symptom assessment.  See below for exercise list Education provided to continue exercise routine in pool as able without pain increase.  Discussed pain monitoring scale for pain amounts to adjust to.      PATIENT EDUCATION: 06/06/2023 Education details: HEP update Person educated: Patient Education method: Programmer, multimedia, Demonstration, Verbal cues, and Handouts Education comprehension: verbalized understanding, returned demonstration, and verbal cues required  HOME EXERCISE PROGRAM: Access Code: [email or web address] URL: [email or web address] Date: 06/06/2023 Prepared by: Chyrel Masson  Exercises - Seated Scapular Retraction  - 3-5 x daily - 7 x weekly - 1 sets - 5-10 reps - 2 hold - Standing Lumbar Extension with Counter  - 3-5 x daily - 7 x weekly - 1 sets - 5-10 reps - 2 hold - Supine Bridge with Resistance Band  - 1-2 x daily - 7 x weekly - 1-2 sets - 10-15 reps - 2 hold - Hooklying Clamshell with Resistance  - 1-2 x daily - 7 x weekly - 1-2 sets - 10-15 reps - 2 hold - Standing Isometric Shoulder External Rotation with Doorway (Mirrored)  - 1-2 x daily - 7 x weekly - 1 sets - 10 reps - 5 hold - Standing Shoulder Flexion AAROM with Dowel  - 2 x daily - 7 x weekly - 1 sets - 10-15 reps  ASSESSMENT:  CLINICAL IMPRESSION: Generally good report noted.  Elevation of Rt shoulder improved compared to previous.  Strength gains to continue to help improve functional movement capability.  Continued skilled PT services indicated at this time.   OBJECTIVE IMPAIRMENTS:  Abnormal gait, decreased activity tolerance, decreased balance, decreased coordination, decreased endurance, decreased mobility, difficulty walking, decreased ROM, decreased strength, increased fascial restrictions, impaired perceived functional ability, increased muscle spasms, impaired flexibility, impaired UE functional use, improper body mechanics, postural dysfunction, and pain.   ACTIVITY LIMITATIONS: carrying, lifting, bending, sitting, standing, squatting, sleeping, stairs, transfers, bed mobility, and locomotion level  PARTICIPATION LIMITATIONS: meal prep, cleaning, laundry, interpersonal relationship, driving, shopping, and community activity  PERSONAL FACTORS: Med hx:  Anemia, history of carcinoma, edema LE, HTN, low back pain, OA, osteoporosis per chart review are also affecting patient's functional outcome.   REHAB POTENTIAL: Good  CLINICAL DECISION MAKING: Evolving/moderate complexity  EVALUATION COMPLEXITY: Moderate   GOALS: Goals reviewed with patient? Yes  SHORT TERM GOALS: (target date for Short term goals are 3 weeks 05/29/2023)  1.Patient will demonstrate independent use of home exercise program to maintain progress from in clinic treatments. Goal status: Partially met, cues needed today.   LONG TERM GOALS: (target dates for all long term goals are 10 weeks  07/17/2023 )   1. Patient will demonstrate/report pain at worst less than or equal to 2/10 to facilitate minimal limitation in daily activity secondary to pain symptoms. Goal status: New   2. Patient will demonstrate independent use of home exercise program to facilitate ability to maintain/progress functional gains from skilled physical therapy services. Goal status: New   3. Patient will demonstrate FOTO outcome > or = 44 % to indicate reduced disability due to condition. Goal status: New   4.  Patient will demonstrate Rt UE MMT 4/5 throughout to facilitate lifting, reaching, carrying at [facility] in daily  activity.   Goal status: New   5.  Patient will demonstrate Rt GH joint AROM WFL s symptoms to facilitate usual overhead reaching, self care, dressing at PLOF.    Goal status: New   6.  Patient bilateral hip MMT abduction 4/5, hip extension and flexion 5/5 to facilitate ambulation, stairs and other daily mobility s restriction.  Goal status: New   7.  Patient will demonstrate SLS >  10 seconds bilateral for stability improvement in ambulation.  Goal Status: New  PLAN:  PT FREQUENCY: 1-2x/week  PT DURATION: 10 weeks  PLANNED INTERVENTIONS: Can include 29562- PT Re-evaluation, 97110-Therapeutic exercises, 97530- Therapeutic activity, [id number]- Neuromuscular re-education, 97535- Self Care, 97140- Manual therapy, [phone]- Gait training, [phone]- Orthotic Fit/training, [phone]- Canalith repositioning, [id number]- Aquatic Therapy, 97014- Electrical stimulation (unattended), [id number]- Electrical stimulation (manual), [id number] Physical performance testing, 97016- Vasopneumatic device, [id number]- Ultrasound, [id number]- Traction (mechanical), [id number]- Ionotophoresis 4mg /ml Dexamethasone, Patient/Family education, Balance training, Stair training, Taping, Dry Needling, Joint mobilization, Joint manipulation, Spinal manipulation, Spinal mobilization, Scar mobilization, Vestibular training, Visual/preceptual remediation/compensation, DME instructions, Cryotherapy, and Moist heat.  All performed as medically necessary.  All included unless contraindicated  PLAN FOR NEXT SESSION: Recheck of eccentric lowering AAROM activity. Prep for going out of town plan.    Chyrel Masson, PT, DPT, OCS, ATC 06/06/23  10:58 AM

## 2023-06-08 ENCOUNTER — Encounter: Payer: Medicare Other | Admitting: Rehabilitative and Restorative Service Providers"

## 2023-06-13 ENCOUNTER — Ambulatory Visit (INDEPENDENT_AMBULATORY_CARE_PROVIDER_SITE_OTHER): Payer: Medicare Other | Admitting: Rehabilitative and Restorative Service Providers"

## 2023-06-13 ENCOUNTER — Encounter: Payer: Self-pay | Admitting: Rehabilitative and Restorative Service Providers"

## 2023-06-13 DIAGNOSIS — M79605 Pain in left leg: Secondary | ICD-10-CM

## 2023-06-13 DIAGNOSIS — R262 Difficulty in walking, not elsewhere classified: Secondary | ICD-10-CM

## 2023-06-13 DIAGNOSIS — M25511 Pain in right shoulder: Secondary | ICD-10-CM

## 2023-06-13 DIAGNOSIS — M6281 Muscle weakness (generalized): Secondary | ICD-10-CM | POA: Diagnosis not present

## 2023-06-13 DIAGNOSIS — G8929 Other chronic pain: Secondary | ICD-10-CM

## 2023-06-13 DIAGNOSIS — R293 Abnormal posture: Secondary | ICD-10-CM

## 2023-06-13 NOTE — Therapy (Addendum)
 OUTPATIENT PHYSICAL THERAPY  TREATMENT / DISCHARGE   Patient Name: Jody [name] MRN: [id number] DOB:1940/09/18, 83 y.o., female Today's Date: 06/13/2023  END OF SESSION:  PT End of Session - 06/13/23 1019     Visit Number 4    Number of Visits 20    Date for PT Re-Evaluation 07/17/23    Authorization Type Medicare and [employer] for life    Progress Note Due on Visit 10    PT Start Time 1015    PT Stop Time 1054    PT Time Calculation (min) 39 min    Activity Tolerance Patient tolerated treatment well    Behavior During Therapy WFL for tasks assessed/performed                Past Medical History:  Diagnosis Date   Anemia, unspecified    on meds   B12 deficiency due to diet    Basal cell carcinoma    LEFT UPPER ARM SUP. TX=CX3 5FU   Cataract    bilateral sx   Cramp of limb    Edema of both legs    Encounter for long-term (current) use of other medications    Hypertension    on meds   Lumbago    Melanoma (HCC) 10/18/2010   RIGHT POST SHOULDER =MOHS   Obesity    Osteoarthrosis, unspecified whether generalized or localized, unspecified site    generalized   Restless legs syndrome (RLS)    SCC (squamous cell carcinoma) 07/03/2017   RIGHT CHEST CX3 5FU   SCC (squamous cell carcinoma) 01/28/2019   LEFT FOREARM CX3 5FU   SCC (squamous cell carcinoma) [phone]   RIGHT FOREHEAD CX3 5FU   SCC (squamous cell carcinoma) 07/03/2017   RIGHT FOREARM CX3 5FU   SCCA (squamous cell carcinoma) of skin 04/08/2020   Right Anterior Mandible (in situ)(CX35FU)   SCCA (squamous cell carcinoma) of skin 04/08/2020   Left Supraorbital Region (Keratoacanthoma)   Senile osteoporosis    Squamous cell carcinoma of skin 01/30/2017   RIGHT JAWLINE TX WITH BX   Past Surgical History:  Procedure Laterality Date   CATARACT EXTRACTION W/ INTRAOCULAR LENS  IMPLANT, BILATERAL  04/2014   COLONOSCOPY     EYE SURGERY Right 04/28/2016   Macular   LEFT HEART CATH AND CORONARY ANGIOGRAPHY  N/A 05/15/2023   Procedure: LEFT HEART CATH AND CORONARY ANGIOGRAPHY;  Surgeon: Knox Perl, MD;  Location: [facility];  Service: Cardiovascular;  Laterality: N/A;   SPINE SURGERY  2005   STOMACH SURGERY  1981   stapleing    TONSILLECTOMY     UPPER GASTROINTESTINAL ENDOSCOPY     VITRECTOMY Right    with membrane peeling for a macular pucker   Patient Active Problem List   Diagnosis Date Noted   Primary osteoarthritis of left knee 09/23/2021   Pure hypercholesterolemia 11/14/2019   Hyperglycemia 11/14/2019   Decreased visual acuity 11/14/2019   Other fatigue 11/14/2019   Positional lightheadedness 11/14/2019   Gastroesophageal reflux disease 02/04/2019   BMI 32.0-32.9,adult 02/12/2018   Obesity (BMI 30.0-34.9) 07/17/2017   Impingement syndrome of right shoulder 07/04/2016   Chronic pain of both knees 07/04/2016   Lumbar back pain with radiculopathy affecting left lower extremity 04/11/2016   Displaced transverse fracture of left patella, subsequent encounter for closed fracture with delayed healing 02/03/2016   Finger fracture, left 02/03/2016   Leg cramps, sleep related 11/19/2015   Cervical spondylosis without myelopathy 06/08/2015   Osteoarthritis of finger of right  hand 06/08/2015   Absolute anemia 05/16/2014   Primary osteoarthritis involving multiple joints 05/16/2014   Cataract cortical, senile 05/16/2014   Essential hypertension 09/03/2012   Edema of both legs    Senile osteoporosis    B12 deficiency due to diet     PCP: [facility]  REFERRING PROVIDER: Shauna Del, DO  REFERRING DIAG: [phone] (ICD-10-CM) - Pain in left buttock M25.511,G89.29 (ICD-10-CM) - Chronic right shoulder pain M75.101 (ICD-10-CM) - Tear of right supraspinatus tendon M12.811 (ICD-10-CM) - Rotator cuff arthropathy of right shoulder S76.302D (ICD-10-CM) - Hamstring injury, left, subsequent encounter  THERAPY DIAG:  Chronic right shoulder pain  Pain in left leg  Muscle  weakness (generalized)  Difficulty in walking, not elsewhere classified  Abnormal posture  Rationale for Evaluation and Treatment: Rehabilitation  ONSET DATE: Fall 2024  SUBJECTIVE:                                                                                                                                                                                      SUBJECTIVE STATEMENT: Pt indicated having a fall when out in yard without cane.  Pt indicated getting foot caught on ivy.  Had to get help to get up.  Pt indicated no physical injury after soreness got better.  Pt indicated lying down exercises aren't her favorite.    PERTINENT HISTORY: Med hx:  Anemia, history of carcinoma, edema LE, HTN, low back pain, OA, osteoporosis per chart review  PAIN:  NPRS scale:  "mild" up to 4/10 Pain location: Rt shoulder  Lt hip/buttock Pain description: tightness, achy Aggravating factors: Rt shoulder:  reaching/lifting, use of cane, reaching back, some resistance in water.    Lt hip/buttock  certain movements cause snapping of tendon, walking/bending.  Relieving factors: medicine, water activity/movement  PRECAUTIONS: None  WEIGHT BEARING RESTRICTIONS: No  FALLS:  Has patient fallen in last 6 months? No  LIVING ENVIRONMENT: Lives with: lives with their family Lives in: House/apartment Stairs: 1 step from carport to kitchen no railing.  Has following equipment at home: cane, walker. - cane in public  2 cats at home.   OCCUPATION: Retired  PLOF: Independent, water exercise.  Rt hand dominant.   PATIENT GOALS: Reduce pain, keep activity and maybe improve balance.    OBJECTIVE:   PATIENT SURVEYS:  06/13/2023; FOTO update:  43  05/08/2023 FOTO intake:   42  predicted:  44  COGNITION: 05/08/2023 Overall cognitive status: WFL     SENSATION: 05/08/2023 Not tested  POSTURE: 05/08/2023 Mild rounded shoulders, increased thoracic kyphosis, weight shift off Lt to Rt mild in  standing.   UPPER EXTREMITY ROM:   ROM Right  05/08/2023 Left 05/08/2023 Right 05/30/2023 Right 06/06/2023  Shoulder flexion 90 deg AROM against gravity  140 AROM in supine    WFL  95 deg AROM against gravity 125 AROM against gravity  Shoulder extension      Shoulder abduction      Shoulder adduction      Shoulder internal rotation 45 in 45 deg abduction     Shoulder external rotation 60 in 45 deg abduction     Elbow flexion      Elbow extension                                          (Blank rows = not tested)   LUMBAR ROM:   ROM AROM  05/08/2023 AROM 05/30/2023  Flexion To mid shin with pulling noted   Extension 50 % WFL  75% WFL  Right lateral flexion    Left lateral flexion    Right rotation    Left rotation     (Blank rows = not tested)  LOWER EXTREMITY ROM:      Right 05/08/2023 Left 05/08/2023  Hip flexion [facility]  Hip extension    Hip abduction    Hip adduction    Hip internal rotation    Hip external rotation    Knee flexion    Knee extension    Ankle dorsiflexion    Ankle plantarflexion    Ankle inversion    Ankle eversion     (Blank rows = not tested)   UPPER EXTREMITY MMT:  MMT Right 05/08/2023 Left 05/08/2023 Right 06/13/2023  Shoulder flexion 2+/5 4/5 3+/5  Shoulder extension     Shoulder abduction 2+/5 4/5   Shoulder adduction     Shoulder internal rotation 4/5 4/5   Shoulder external rotation 4/5 4/5   Middle trapezius     Lower trapezius     Elbow flexion     Elbow extension     Wrist flexion     Wrist extension     Wrist ulnar deviation     Wrist radial deviation     Wrist pronation     Wrist supination     Grip strength (lbs)     (Blank rows = not tested)  LOWER EXTREMITY MMT:    MMT Right 05/08/2023 Left 05/08/2023  Hip flexion 4/5 4/5  Hip extension 3+/5 3+/5  Hip abduction 3/5 3/5  Hip adduction    Hip internal rotation    Hip external rotation    Knee flexion    Knee extension 4/5 4/5  Ankle dorsiflexion 5/5  5/5  Ankle plantarflexion    Ankle inversion    Ankle eversion     (Blank rows = not tested)   SPECIAL TESTS: 05/08/2023 Rt shoulder painful arc, shrug in elevation.    JOINT MOBILITY TESTING:  05/08/2023 Crepitus noted in bilateral shoulder, knee bilateral  PALPATION:  05/08/2023 Tenderness Lt ischial tuberosity.  Trigger points noted in Rt infraspinatus, glute max/med.   GAIT Cane use in Rt UE c reduced stance on Lt, reduced step length with Rt leg.  TODAY'S TREATMENT:                                                                                                       DATE:  06/13/2023 Therex: Nustep Lvl 5 LE 10 mins  Supine lumbar trunk rotation stretch 15 sec x 3 bilateral Supine hooklying clam shell blue c isometric contralateral hold x 20 bilateral  Supine bridge c blue band hooklying hip abduction hold 2 x 10 2-3 sec hold.   Time spent in review of existing HEP for due to leaving town for about 3 weeks.   Neuro Re-ed (postural awareness and muscle activation) Seated scapular retraction 3 sec hold x 10  Seated tband rows red 2 x 10 c scap retraction focus   Feet together stance Eyes open with head turns x 10 each way (cues for home use in corner) Modified tandem stance 30 sec x 1 bilateral with cues for home use)  TherActivity Review of ground to kneeling to standing tranfer options for improvement after fall/kneeling.  Suggested step stool/ladder in garden or outdoor activity for seat and also to help improve transfers.   TODAY'S TREATMENT:                                                                                                       DATE:  06/06/2023 Therex: Nustep Lvl 5 LE 8 mins  Review of existing HEP c verbal cues.  Handout given for update.   Neuro Re-ed (postural  awareness and muscle activation) Seated scapular retraction 3 sec hold x 10  Seated tband rows red 2 x 10 c scap retraction focus   TherActivity Seated AAROM cane shoulder flexion with focus on Rt arm lowering control x 12 with cues for home use.  To improve functional reach Sit to stand to sit s UE assist x 10 c cues for positioning 21 inch height.     TODAY'S TREATMENT:                                                                                                       DATE:  05/30/2023 Therex: Nustep Lvl 5 UE/LE 4 mins, LE only 4 mins (limited by Rt shoulder pain.  Supine lumbar trunk rotation  stretch 15 sec x 5 bilateral  Standing lumbar extension x 5 with cues for head positioning to avoid vestibular challenge Supine hooklying clam shell blue c isometric contralateral hold x 20 bilateral  Supine bridge c blue band hooklying hip abduction hold 2 x 10 2-3 sec hold.     Reviewed HEP to ensure good techniques and procedures for gains but limiting pain symptoms.  Discussed and review adaption of aquatic activity to address pain responses.   Neuro Re-ed (postural awareness and muscle activation) Seated scapular retraction 3 sec hold x 10  Seated Rt shoulder ER isometric with clinican resistance 5 sec hold x 6 with cues for pain free    TODAY'S TREATMENT:                                                                                                       DATE: 05/08/2023 Therex:    HEP instruction/performance c cues for techniques, handout provided.  Trial set performed of each for comprehension and symptom assessment.  See below for exercise list Education provided to continue exercise routine in pool as able without pain increase.  Discussed pain monitoring scale for pain amounts to adjust to.      PATIENT EDUCATION: 06/06/2023 Education details: HEP update Person educated: Patient Education method: Programmer, multimedia, Demonstration, Verbal cues, and Handouts Education comprehension:  verbalized understanding, returned demonstration, and verbal cues required  HOME EXERCISE PROGRAM: Access Code: [email or web address] URL: [email or web address] Date: 06/06/2023 Prepared by: Bonna Bustard  Exercises - Seated Scapular Retraction  - 3-5 x daily - 7 x weekly - 1 sets - 5-10 reps - 2 hold - Standing Lumbar Extension with Counter  - 3-5 x daily - 7 x weekly - 1 sets - 5-10 reps - 2 hold - Supine Bridge with Resistance Band  - 1-2 x daily - 7 x weekly - 1-2 sets - 10-15 reps - 2 hold - Hooklying Clamshell with Resistance  - 1-2 x daily - 7 x weekly - 1-2 sets - 10-15 reps - 2 hold - Standing Isometric Shoulder External Rotation with Doorway (Mirrored)  - 1-2 x daily - 7 x weekly - 1 sets - 10 reps - 5 hold - Standing Shoulder Flexion AAROM with Dowel  - 2 x daily - 7 x weekly - 1 sets - 10-15 reps  ASSESSMENT:  CLINICAL IMPRESSION: The patient has attended 4 visits over the course of treatment cycle.  Patient has reported overall improvement at  moderately better +4 on GROC .   See objective data above for updated information regarding current presentation.   Pt to go out of town and will not be able to return for 3-4 weeks.     OBJECTIVE IMPAIRMENTS: Abnormal gait, decreased activity tolerance, decreased balance, decreased coordination, decreased endurance, decreased mobility, difficulty walking, decreased ROM, decreased strength, increased fascial restrictions, impaired perceived functional ability, increased muscle spasms, impaired flexibility, impaired UE functional use, improper body mechanics, postural dysfunction, and pain.   ACTIVITY LIMITATIONS: carrying, lifting, bending, sitting, standing, squatting, sleeping, stairs, transfers, bed mobility, and locomotion level  PARTICIPATION LIMITATIONS:  meal prep, cleaning, laundry, interpersonal relationship, driving, shopping, and community activity  PERSONAL FACTORS: Med hx:  Anemia, history of carcinoma, edema LE, HTN, low  back pain, OA, osteoporosis per chart review are also affecting patient's functional outcome.   REHAB POTENTIAL: Good  CLINICAL DECISION MAKING: Evolving/moderate complexity  EVALUATION COMPLEXITY: Moderate   GOALS: Goals reviewed with patient? Yes  SHORT TERM GOALS: (target date for Short term goals are 3 weeks 05/29/2023)  1.Patient will demonstrate independent use of home exercise program to maintain progress from in clinic treatments. Goal status: Partially met, cues needed today.   LONG TERM GOALS: (target dates for all long term goals are 10 weeks  07/17/2023 )   1. Patient will demonstrate/report pain at worst less than or equal to 2/10 to facilitate minimal limitation in daily activity secondary to pain symptoms. Goal status: partially met 06/13/2023   2. Patient will demonstrate independent use of home exercise program to facilitate ability to maintain/progress functional gains from skilled physical therapy services. Goal status: partially met 06/13/2023   3. Patient will demonstrate FOTO outcome > or = 44 % to indicate reduced disability due to condition. Goal status: New   4.  Patient will demonstrate Rt UE MMT 4/5 throughout to facilitate lifting, reaching, carrying at [facility] in daily activity.   Goal status: on going 06/13/2023   5.  Patient will demonstrate Rt GH joint AROM WFL s symptoms to facilitate usual overhead reaching, self care, dressing at PLOF.    Goal status: on going 06/13/2023   6.  Patient bilateral hip MMT abduction 4/5, hip extension and flexion 5/5 to facilitate ambulation, stairs and other daily mobility s restriction.  Goal status: on going 06/13/2023   7.  Patient will demonstrate SLS > 10 seconds bilateral for stability improvement in ambulation.  Goal Status: on going 06/13/2023  PLAN:  PT FREQUENCY: 1-2x/week  PT DURATION: 10 weeks  PLANNED INTERVENTIONS: Can include 10932- PT Re-evaluation, 97110-Therapeutic exercises, 97530- Therapeutic  activity, 97112- Neuromuscular re-education, 97535- Self Care, 97140- Manual therapy, [phone]- Gait training, [phone]- Orthotic Fit/training, [phone]- Canalith repositioning, [id number]- Aquatic Therapy, 97014- Electrical stimulation (unattended), [id number]- Electrical stimulation (manual), [id number] Physical performance testing, 97016- Vasopneumatic device, [id number]- Ultrasound, [id number]- Traction (mechanical), [id number]- Ionotophoresis 4mg /ml Dexamethasone , Patient/Family education, Balance training, Stair training, Taping, Dry Needling, Joint mobilization, Joint manipulation, Spinal manipulation, Spinal mobilization, Scar mobilization, Vestibular training, Visual/preceptual remediation/compensation, DME instructions, Cryotherapy, and Moist heat.  All performed as medically necessary.  All included unless contraindicated  PLAN FOR NEXT SESSION:  Check on symptoms from time since last visit.   Will need recert/progress note upon returning.    Bonna Bustard, PT, DPT, OCS, ATC 06/13/23  11:03 AM   PHYSICAL THERAPY DISCHARGE SUMMARY  Visits from Start of Care: 4  Current functional level related to goals / functional outcomes: See note   Remaining deficits: See note   Education / Equipment: HEP  Patient goals were partially met. Patient is being discharged due to not returning since the last visit.  Bonna Bustard, PT, DPT, OCS, ATC 07/17/23  2:54 PM

## 2023-06-14 ENCOUNTER — Other Ambulatory Visit: Payer: Self-pay

## 2023-06-14 ENCOUNTER — Encounter: Payer: Self-pay | Admitting: Sports Medicine

## 2023-06-14 ENCOUNTER — Ambulatory Visit: Admitting: Sports Medicine

## 2023-06-14 DIAGNOSIS — S76302D Unspecified injury of muscle, fascia and tendon of the posterior muscle group at thigh level, left thigh, subsequent encounter: Secondary | ICD-10-CM | POA: Diagnosis not present

## 2023-06-14 DIAGNOSIS — M79642 Pain in left hand: Secondary | ICD-10-CM

## 2023-06-14 DIAGNOSIS — G5601 Carpal tunnel syndrome, right upper limb: Secondary | ICD-10-CM | POA: Diagnosis not present

## 2023-06-14 DIAGNOSIS — G5602 Carpal tunnel syndrome, left upper limb: Secondary | ICD-10-CM

## 2023-06-14 DIAGNOSIS — M7918 Myalgia, other site: Secondary | ICD-10-CM

## 2023-06-14 NOTE — Progress Notes (Signed)
 Patient says that her hamstring pain has improved quite a bit and believes that one more shockwave treatment will likely help. She has finished with physical therapy and continues with her exercises at home.  Patient has had pain and burning in her left hand that is waking her up multiple times throughout the night. She says that she has been wearing her wrist brace to sleep. This does happen during the day, but she says that is manageable. She has numbness in the tip of the left thumb consistently. She is having some of these symptoms begin to pop up in the right hand as well.

## 2023-06-14 NOTE — Progress Notes (Signed)
 Jody [name] - 83 y.o. female MRN [id number]  Date of birth: [birth year]  Office Visit Note: Visit Date: 06/14/2023 [facility]CP: [facility] Referred by: [facility]  Subjective: Chief Complaint  Patient presents with   Left Hip - Follow-up   Left Hand - Pain   HPI: Jody [name] is a pleasant 83 y.o. female who presents today for  follow-up of posterior hip/likely hamstring tear and L > R CTS.   Left buttock/hamstring - we had for warmth 2 treatments of extracorporeal shockwave therapy and she is undergoing formalized physical therapy making good progress.  Having much less discomfort and pain when laying down.  Carpal tunnel -this is mostly in the left hand but she is also starting to feel somewhat on the right.  She has been sleeping in her cock-up wrist braces and she is on gabapentin 300 mg AM and 600mg  at bedtime.   Pertinent ROS were reviewed with the patient and found to be negative unless otherwise specified above in HPI.   Assessment & Plan: Visit Diagnoses:  1. Carpal tunnel syndrome, left upper limb   2. Pain in left hand   3. Carpal tunnel syndrome, right upper limb   4. Hamstring injury, left, subsequent encounter   5. Pain in left buttock    Plan: Impression is chronic but progressive worsening carpal tunnel syndrome of the left upper extremity.  She does have symptoms on the right side but these are not as bothersome.  Her symptoms have been exacerbated that she is now getting numbness more consistently in the left thumb and this is waking her up at nighttime.  Through shared decision making, we did proceed with ultrasound-guided carpal tunnel injection and associated hydrodissection.  She tolerated well.  She may continue her gabapentin 300 mg a.m. and 600 mg nightly for both her carpal tunnel and peripheral polyneuropathy.  She will wear her cock-up wrist braces at nighttime bilaterally.  She has made vast improvement from her proximal  hamstring partial tear with 2 treatments of extracorporeal shockwave therapy.  We did proceed with a final treatment of these ESWT today, tolerated well.  She may continue her aquatic therapy in formalized physical therapy as indicated.  She will follow-up in 1 month to follow-up on her bilateral carpal tunnel syndrome and shoulder.  Follow-up: Return in about 1 month (around 07/15/2023) for for b/l CTS (30-mins for proc).   Meds & Orders: No orders of the defined types were placed in this encounter.   Orders Placed This Encounter  Procedures   US Guided Needle Placement - No Linked Charges     Procedures:  Procedure: US-guided Carpal Tunnel Injection, Left Wrist After informed verbal consent and discussion on R/B/I, a timeout was performed, patient was seated on exam table with the affected arm placed in supine position on table. The area overlying the patient's carpal tunnel was prepped with Chloraprep and alcohol swabs then utilizing ultrasound guidance in a short-axis position via an in-plane approach, the patient's carpal tunnel was injected with a mixture of 1:3:1 lidocaine:D5W:betamethasone with hydrodissection of the median nerve from the flexor retinaculum in 360 degree fashion. Visualization of the needle was achieved with a transverse, in-plane approach. Patient tolerated procedure well without immediate complications.  *Needle guidance was necessary for proper delivery of injectate and avoidance of structures in the region of injection.  _________________________________________________________  Procedure: ECSWT Indications:  Proximal hamstring tendinopathy/Ischial bursitis   Procedure Details Consent: Risks of procedure as well  as the alternatives and risks of each were explained to the patient.  Verbal consent for procedure obtained. Time Out: Verified patient identification, verified procedure, site was marked, verified correct patient position. The area was cleaned with  alcohol swab.     The Left proximal hamstring origin and ischial bursa was targeted for Extracorporeal shockwave therapy.    Preset: Tendinopathy/Ischial Bursitis Power Level: 120 mJ  Frequency: 14 Hz Impulse/cycles: 3300 Head size: Regular   Patient tolerated procedure well without immediate complications.       Clinical History: No specialty comments available.  She reports that she quit smoking about 50 years ago. Her smoking use included cigarettes. She started smoking about 70 years ago. She has never used smokeless tobacco. No results for input(s): "HGBA1C", "LABURIC" in the last 8760 hours.  Objective:     Physical Exam  Gen: Well-appearing, in no acute distress; non-toxic CV: Well-perfused. Warm.  Resp: Breathing unlabored on room air; no wheezing. Psych: Fluid speech in conversation; appropriate affect; normal thought process  Ortho Exam - Bilateral wrist: + Tinel's sign about the left wrist.  Positive Durkan's, positive Phalen's L > R.  There are 2 small volar ganglion cysts just proximal to the carpal tunnel inlet bilaterally.  - Left buttock: Very minimal TTP over the ischial tuberosity and hamstring insertion.  There is some overlying scar tissue palpable here, otherwise no acute change.  No redness or swelling  Imaging:  03/28/23: 2 view x-ray of the left hip including AP and lateral film were ordered  and reviewed by myself today. X-ray shows a femoral head well-seated  within acetabulum. There is only mild, age-appropriate arthritic change.   No acute fracture or otherwise bony abnormality noted.   Past Medical/Family/Surgical/Social History: Medications & Allergies reviewed per EMR, new medications updated. Patient Active Problem List   Diagnosis Date Noted   Primary osteoarthritis of left knee 09/23/2021   Pure hypercholesterolemia 11/14/2019   Hyperglycemia 11/14/2019   Decreased visual acuity 11/14/2019   Other fatigue 11/14/2019   Positional  lightheadedness 11/14/2019   Gastroesophageal reflux disease 02/04/2019   BMI 32.0-32.9,adult 02/12/2018   Obesity (BMI 30.0-34.9) 07/17/2017   Impingement syndrome of right shoulder 07/04/2016   Chronic pain of both knees 07/04/2016   Lumbar back pain with radiculopathy affecting left lower extremity 04/11/2016   Displaced transverse fracture of left patella, subsequent encounter for closed fracture with delayed healing 02/03/2016   Finger fracture, left 02/03/2016   Leg cramps, sleep related 11/19/2015   Cervical spondylosis without myelopathy 06/08/2015   Osteoarthritis of finger of right hand 06/08/2015   Absolute anemia 05/16/2014   Primary osteoarthritis involving multiple joints 05/16/2014   Cataract cortical, senile 05/16/2014   Essential hypertension 09/03/2012   Edema of both legs    Senile osteoporosis    B12 deficiency due to diet    Past Medical History:  Diagnosis Date   Anemia, unspecified    on meds   B12 deficiency due to diet    Basal cell carcinoma    LEFT UPPER ARM SUP. TX=CX3 5FU   Cataract    bilateral sx   Cramp of limb    Edema of both legs    Encounter for long-term (current) use of other medications    Hypertension    on meds   Lumbago    Melanoma (HCC) 10/18/2010   RIGHT POST SHOULDER =MOHS   Obesity    Osteoarthrosis, unspecified whether generalized or localized, unspecified site    generalized  Restless legs syndrome (RLS)    SCC (squamous cell carcinoma) 07/03/2017   RIGHT CHEST CX3 5FU   SCC (squamous cell carcinoma) 01/28/2019   LEFT FOREARM CX3 5FU   SCC (squamous cell carcinoma) [id number]   RIGHT FOREHEAD CX3 5FU   SCC (squamous cell carcinoma) 07/03/2017   RIGHT FOREARM CX3 5FU   SCCA (squamous cell carcinoma) of skin 04/08/2020   Right Anterior Mandible (in situ)(CX35FU)   SCCA (squamous cell carcinoma) of skin 04/08/2020   Left Supraorbital Region (Keratoacanthoma)   Senile osteoporosis    Squamous cell carcinoma of skin  01/30/2017   RIGHT JAWLINE TX WITH BX   Family History  Problem Relation Age of Onset   Diabetes Father    Prostate cancer Father    Rectal cancer Sister 16   Colon polyps Sister    Cancer Sister    Diabetes Brother    Prostate cancer Brother    Lung cancer Brother 29   Other Brother        declined after a fall   Stomach cancer Maternal Grandmother    Pancreatic cancer Neg Hx    Colon cancer Neg Hx    Esophageal cancer Neg Hx    Past Surgical History:  Procedure Laterality Date   CATARACT EXTRACTION W/ INTRAOCULAR LENS  IMPLANT, BILATERAL  04/2014   COLONOSCOPY     EYE SURGERY Right 04/28/2016   Macular   LEFT HEART CATH AND CORONARY ANGIOGRAPHY N/A 05/15/2023   Procedure: LEFT HEART CATH AND CORONARY ANGIOGRAPHY;  Surgeon: Yates Decamp, MD;  Location: [facility];  Service: Cardiovascular;  Laterality: N/A;   SPINE SURGERY  2005   STOMACH SURGERY  1981   stapleing    TONSILLECTOMY     UPPER GASTROINTESTINAL ENDOSCOPY     VITRECTOMY Right    with membrane peeling for a macular pucker   Social History   Occupational History   Occupation: Retired Nurse, mental health: [employer]  Tobacco Use   Smoking status: Former    Current packs/day: 0.00    Types: Cigarettes    Start date: 1955    Quit date: 1975    Years since quitting: 50.2   Smokeless tobacco: Never   Tobacco comments:    Quit in early 30's  Vaping Use   Vaping status: Never Used  Substance and Sexual Activity   Alcohol use: No    Alcohol/week: 0.0 standard drinks of alcohol   Drug use: No   Sexual activity: Never

## 2023-06-15 ENCOUNTER — Ambulatory Visit: Payer: Medicare Other | Attending: Cardiology | Admitting: Cardiology

## 2023-06-15 ENCOUNTER — Encounter: Payer: Self-pay | Admitting: Cardiology

## 2023-06-15 VITALS — BP 114/68 | HR 68 | Resp 16 | Ht 60.0 in | Wt 119.0 lb

## 2023-06-15 DIAGNOSIS — E7800 Pure hypercholesterolemia, unspecified: Secondary | ICD-10-CM | POA: Diagnosis present

## 2023-06-15 DIAGNOSIS — K219 Gastro-esophageal reflux disease without esophagitis: Secondary | ICD-10-CM | POA: Insufficient documentation

## 2023-06-15 DIAGNOSIS — I10 Essential (primary) hypertension: Secondary | ICD-10-CM | POA: Insufficient documentation

## 2023-06-15 DIAGNOSIS — R079 Chest pain, unspecified: Secondary | ICD-10-CM | POA: Diagnosis present

## 2023-06-15 MED ORDER — OMEPRAZOLE 20 MG PO CPDR: 20.0000 mg | DELAYED_RELEASE_CAPSULE | Freq: Every day | ORAL | 0 refills | Status: DC | PRN

## 2023-06-15 NOTE — Progress Notes (Signed)
 Cardiology Office Note:  .   Date:  06/17/2023  ID:  Jody [name], DOB 1940-12-08, MRN [id number] PCP: [name], DO  [facility] Providers Cardiologist:  Yates Decamp, MD   History of Present Illness: [name]   Jody [name] is a 83 y.o. 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  In view of persistent symptoms of chest discomfort, she underwent left heart catheterization on 05/15/2023 revealing normal coronary arteries.  She now presents for follow-up.  Discussed the use of AI scribe software for clinical note transcription with the patient, who gave verbal consent to proceed.  The patient presents for a follow-up visit after a cardiac catheterization, which showed normal heart arteries. She reports no complications from the procedure, except for some bleeding at the site, which was managed appropriately. She expresses relief and reassurance about the results of the procedure.  She continues to experience chest discomfort, which she has noticed seems to be connected with eating. She suspects it might be due to acid reflux. She has had extensive gastric testing in the past, all of which were normal. She has also lost a significant amount of weight, which she believes might have affected her digestive system.  The patient is currently on metoprolol 25mg  once a day for blood pressure control and simvastatin 10mg  in the evening for cholesterol management. She also carries nitroglycerin as needed.   Labs   Lab Results  Component Value Date   CHOL 144 05/01/2023   HDL 74 05/01/2023   LDLCALC 54 05/01/2023   TRIG 87 05/01/2023   CHOLHDL 1.9 05/01/2023   Lab Results  Component Value Date   NA 141 05/11/2023   K 4.3 05/11/2023    CO2 23 05/11/2023   GLUCOSE 59 (L) 05/11/2023   BUN 20 05/11/2023   CREATININE 0.87 05/11/2023   CALCIUM 9.8 05/11/2023   GFR 52.74 (L) 11/05/2021   EGFR 66 05/11/2023   GFRNONAA 41 (L) 11/14/2019      Latest Ref Rng & Units 05/11/2023    3:22 PM 11/15/2021    3:11 PM 11/05/2021   10:27 AM  BMP  Glucose 70 - 99 mg/dL 59  409  91   BUN 8 - 27 mg/dL 20  15  25    Creatinine 0.57 - 1.00 mg/dL 8.11  9.14  7.82   BUN/Creat Ratio 12 - 28 23  14     Sodium 134 - 144 mmol/L 141  132  131   Potassium 3.5 - 5.2 mmol/L 4.3  4.4  4.2   Chloride 96 - 106 mmol/L 103  97  95   CO2 20 - 29 mmol/L 23  25  28    Calcium 8.7 - 10.3 mg/dL 9.8  95.6  9.7       Latest Ref Rng & Units 05/11/2023    3:22 PM 11/05/2021   10:27 AM 05/24/2021    2:53 PM  CBC  WBC 3.4 - 10.8 x10E3/uL 7.3  6.2  6.1   Hemoglobin 11.1 - 15.9 g/dL 21.3  08.6  57.8   Hematocrit 34.0 - 46.6 % 34.9  32.5  33.4   Platelets 150 - 450 x10E3/uL 240  292.0  295    Lab Results  Component Value Date   HGBA1C 5.5 11/14/2019  Lab Results  Component Value Date   TSH 3.20 11/14/2019    Review of Systems  Cardiovascular:  Negative for chest pain, dyspnea on exertion and leg swelling.   Physical Exam:   VS:  BP 114/68 (BP Location: Left Arm, Patient Position: Sitting, Cuff Size: Normal)   Pulse 68   Resp 16   Ht 5' (1.524 m)   Wt 119 lb (54 kg)   SpO2 96%   BMI 23.24 kg/m    Wt Readings from Last 3 Encounters:  06/15/23 119 lb (54 kg)  05/15/23 120 lb (54.4 kg)  05/10/23 120 lb 3.2 oz (54.5 kg)    Physical Exam Neck:     Vascular: No carotid bruit or JVD.  Cardiovascular:     Rate and Rhythm: Normal rate and regular rhythm.     Pulses: Intact distal pulses.     Heart sounds: Normal heart sounds. No murmur heard.    No gallop.  Pulmonary:     Effort: Pulmonary effort is normal.     Breath sounds: Normal breath sounds.  Abdominal:     General: Bowel sounds are normal.     Palpations: Abdomen is soft.   Musculoskeletal:     Right lower leg: No edema.     Left lower leg: No edema.    Studies Reviewed: [name]    CARDIAC CATHETERIZATION 05/15/2023      Impression and recommendations: Chest pain could either be secondary to subendocardial ischemia from markedly elevated EDP at 24 mmHg versus noncardiac chest pain. Patient denies any significant dyspnea has suspect evaluation for noncardiac causes of chest pain including GI evaluation may be more appropriate. Patient be discharged with outpatient management.  EKG:    EKG 02/27/2023: Normal sinus rhythm Normal ECG   Medications and allergies    Allergies  Allergen Reactions   Shrimp [Shellfish Allergy] Anaphylaxis    ALL SEAFOOD   Prolia [Denosumab] Other (See Comments)    Chest pain   Percocet [Oxycodone-Acetaminophen]     Confusion      Current Outpatient Medications:    Bacillus Coagulans-Inulin (ALIGN PREBIOTIC-PROBIOTIC PO), Take 1 capsule by mouth at bedtime., Disp: , Rfl:    Calcium Carbonate-Vit D-Min (CALCIUM 1200 PO), Take 1 tablet by mouth in the morning., Disp: , Rfl:    celecoxib (CELEBREX) 100 MG capsule, Take 1 capsule (100 mg total) by mouth 2 (two) times daily as needed. (Patient taking differently: Take 100 mg by mouth See admin instructions. Take 1 capsule (100 mg) by mouth scheduled every morning, may take an additional dose (100 mg) in the afternoon, if needed for pain.), Disp: 180 capsule, Rfl: 2   diclofenac Sodium (VOLTAREN ARTHRITIS PAIN) 1 % GEL, Apply 1 Application topically 4 (four) times daily as needed (pain.)., Disp: , Rfl:    gabapentin (NEURONTIN) 300 MG capsule, Take 300 mg by mouth 3 (three) times daily., Disp: , Rfl:    Iron 66 MG TABS, Take 66 mg by mouth in the morning., Disp: , Rfl:    lidocaine (LIDODERM) 5 %, Place 1 patch onto the skin daily as needed (pain.). Remove & Discard patch within 12 hours or as directed by MD, Disp: , Rfl:    metoprolol succinate (TOPROL-XL) 25 MG 24 hr tablet, TAKE 1  TABLET(25 MG) BY MOUTH DAILY, Disp: 90 tablet, Rfl: 3   Multiple Vitamin (MULTIVITAMIN WITH MINERALS) TABS tablet, Take 1 tablet by mouth in the morning., Disp: , Rfl:    nitroGLYCERIN (NITROSTAT) 0.4 MG  SL tablet, DISSOLVE 1 TABLET UNDER THE TONGUE EVERY 5 MINUTES AS NEEDED FOR CHEST PAIN, Disp: 25 tablet, Rfl: 3   olopatadine (PATADAY) 0.1 % ophthalmic solution, Place 1 drop into both eyes daily., Disp: , Rfl:    omeprazole (PRILOSEC) 20 MG capsule, Take 1 capsule (20 mg total) by mouth daily as needed (Chest pain and heart burn)., Disp: 60 capsule, Rfl: 0   Probiotic Product (ALIGN) 10 MG CAPS, Take 1 tablet by mouth daily., Disp: , Rfl:    Zoledronic Acid (RECLAST IV), as directed. Once yearly, last infusion was November 2024, Disp: , Rfl:    simvastatin (ZOCOR) 10 MG tablet, Take 1 tablet (10 mg total) by mouth at bedtime., Disp: 90 tablet, Rfl: 3   ASSESSMENT AND PLAN: .      ICD-10-CM   1. Chest pain due to GERD  K21.9 omeprazole (PRILOSEC) 20 MG capsule   R07.9     2. Primary hypertension  I10     3. Pure hypercholesterolemia  E78.00       Meds ordered this encounter  Medications   omeprazole (PRILOSEC) 20 MG capsule    Sig: Take 1 capsule (20 mg total) by mouth daily as needed (Chest pain and heart burn).    Dispense:  60 capsule    Refill:  0    There are no discontinued medications.    Assessment and Plan  Chest Pain   Intermittent chest pain is not cardiac-related, likely due to silent reflux. Extensive testing confirms the heart is not the source. The pain is associated with eating. Prescribe omeprazole as needed, to be taken for a day or two when symptoms occur, and send to [facility]. Discussed that nitroglycerin can help with GERD symptoms.  Hypertension   Blood pressure is well controlled with metoprolol 25 mg once daily. Current reading is 114/68 mmHg. Continue metoprolol 25 mg once daily.  Hyperlipidemia   Cholesterol levels are well controlled with  simvastatin 10 mg daily. Continue simvastatin 10 mg daily. As she remains stable, and issues are noncardiac, I will see her back on a as needed basis. Signed,  Yates Decamp, MD, [facility] 06/17/2023, 8:13 AM [facility] [address] [place], [place] [address] Phone: [phone]. Fax:  [phone]

## 2023-06-15 NOTE — Patient Instructions (Signed)
 Medication Instructions:  Your physician has recommended you make the following change in your medication:  Start Omeprazole 20 mg by mouth daily as needed  *If you need a refill on your cardiac medications before your next appointment, please call your pharmacy*   Lab Work: none If you have labs (blood work) drawn today and your tests are completely normal, you will receive your results only by: MyChart Message (if you have MyChart) OR A paper copy in the mail If you have any lab test that is abnormal or we need to change your treatment, we will call you to review the results.   Testing/Procedures: none   Follow-Up: At [facility], you and your health needs are our priority.  As part of our continuing mission to provide you with exceptional heart care, we have created designated Provider Care Teams.  These Care Teams include your primary Cardiologist (physician) and Advanced Practice Providers (APPs -  Physician Assistants and Nurse Practitioners) who all work together to provide you with the care you need, when you need it.  We recommend signing up for the patient portal called "MyChart".  Sign up information is provided on this After Visit Summary.  MyChart is used to connect with patients for Virtual Visits (Telemedicine).  Patients are able to view lab/test results, encounter notes, upcoming appointments, etc.  Non-urgent messages can be sent to your provider as well.   To learn more about what you can do with MyChart, go to [email or web address].    Your next appointment:   As needed  Provider:   Yates Decamp, MD     Other Instructions

## 2023-06-16 ENCOUNTER — Encounter: Payer: Self-pay | Admitting: Sports Medicine

## 2023-06-20 ENCOUNTER — Encounter: Payer: Medicare Other | Admitting: Rehabilitative and Restorative Service Providers"

## 2023-07-13 ENCOUNTER — Ambulatory Visit (INDEPENDENT_AMBULATORY_CARE_PROVIDER_SITE_OTHER): Admitting: Sports Medicine

## 2023-07-13 ENCOUNTER — Encounter: Payer: Self-pay | Admitting: Sports Medicine

## 2023-07-13 DIAGNOSIS — G5601 Carpal tunnel syndrome, right upper limb: Secondary | ICD-10-CM | POA: Diagnosis not present

## 2023-07-13 DIAGNOSIS — M25511 Pain in right shoulder: Secondary | ICD-10-CM

## 2023-07-13 DIAGNOSIS — G5602 Carpal tunnel syndrome, left upper limb: Secondary | ICD-10-CM | POA: Diagnosis not present

## 2023-07-13 DIAGNOSIS — S76302D Unspecified injury of muscle, fascia and tendon of the posterior muscle group at thigh level, left thigh, subsequent encounter: Secondary | ICD-10-CM

## 2023-07-13 DIAGNOSIS — G8929 Other chronic pain: Secondary | ICD-10-CM

## 2023-07-13 NOTE — Progress Notes (Signed)
 Jody [name] - 83 y.o. female MRN [id number]  Date of birth: 1940-09-17  Office Visit Note: Visit Date: 07/13/2023 PCP: [name], DO Referred by: [facility]*  Subjective: Chief Complaint  Patient presents with   Right Hand - Follow-up   Left Hand - Follow-up   HPI: Jody [name] is a pleasant 83 y.o. female who presents today for follow-up of bilateral carpal tunnel, right shoulder pain.  Wrists/carpal tunnel -we did perform ultrasound-guided carpal tunnel injection with median nerve hydrodissection on 06/14/2023.  She had essentially complete relief of her symptoms and has been doing very well.  Felt very mild symptoms the other day when she was doing a lot of typing but in general this is very well-controlled.  She does not think she needs injection for the right side today as this is less bothersome than the right.  She continues in her cock up wrist braces as well as her gabapentin 300 mg a.m. and 600 mg nightly.  Right shoulder -this continues to do well from her previous aspiration of her infusion and injection.  She continues in the pool but has decreased the drag which has been helpful.  Still doing well from her left hamstring injury, has learned how to deal with this with activity modification, but prior shockwave was very helpful.  She recently returned from her trip to [place] and had a wonderful time with this.  Pertinent ROS were reviewed with the patient and found to be negative unless otherwise specified above in HPI.   Assessment & Plan: Visit Diagnoses:  1. Carpal tunnel syndrome, left upper limb   2. Carpal tunnel syndrome, right upper limb   3. Hamstring injury, left, subsequent encounter   4. Chronic right shoulder pain    Plan: Impression is essentially resolved carpal tunnel syndrome is on the left after a carpal tunnel injection with median nerve Hydrodissection.  For both right and left CTS, she will continue her gabapentin 300 mg  a.m. and 600 mg nightly.  She will continue in her cock-up wrist braces at nighttime bilaterally.  She will continue with her range of motion and her exercises for the shoulder as well as her aquatic-based therapy.  The hamstring injury is also controlled, she will continue with her activity modifications.  We did discuss we could always consider additional shockwave therapy if this becomes symptomatic, but given this is well-controlled we will hold for now.  She will follow-up with me as needed.  Follow-up: Return if symptoms worsen or fail to improve.   Meds & Orders: No orders of the defined types were placed in this encounter.  No orders of the defined types were placed in this encounter.    Procedures: No procedures performed      Clinical History: No specialty comments available.  She reports that she quit smoking about 50 years ago. Her smoking use included cigarettes. She started smoking about 70 years ago. She has never used smokeless tobacco. No results for input(s): "HGBA1C", "LABURIC" in the last 8760 hours.  Objective:    Physical Exam  Gen: Well-appearing, in no acute distress; non-toxic CV: Well-perfused. Warm.  Resp: Breathing unlabored on room air; no wheezing. Psych: Fluid speech in conversation; appropriate affect; normal thought process  Ortho Exam - Wrists: There are small volar ganglion cyst present.  Negative Tinel's.  Good range of motion about the wrist.  - Right shoulder: Trace effusion on the right shoulder.  There is painless range of motion,  there is still some restriction with endrange flexion and abduction but significantly improved from prior visits.  Imaging: No results found.  Past Medical/Family/Surgical/Social History: Medications & Allergies reviewed per EMR, new medications updated. Patient Active Problem List   Diagnosis Date Noted   Primary osteoarthritis of left knee 09/23/2021   Pure hypercholesterolemia 11/14/2019   Hyperglycemia  11/14/2019   Decreased visual acuity 11/14/2019   Other fatigue 11/14/2019   Positional lightheadedness 11/14/2019   Gastroesophageal reflux disease 02/04/2019   BMI 32.0-32.9,adult 02/12/2018   Obesity (BMI 30.0-34.9) 07/17/2017   Impingement syndrome of right shoulder 07/04/2016   Chronic pain of both knees 07/04/2016   Lumbar back pain with radiculopathy affecting left lower extremity 04/11/2016   Displaced transverse fracture of left patella, subsequent encounter for closed fracture with delayed healing 02/03/2016   Finger fracture, left 02/03/2016   Leg cramps, sleep related 11/19/2015   Cervical spondylosis without myelopathy 06/08/2015   Osteoarthritis of finger of right hand 06/08/2015   Absolute anemia 05/16/2014   Primary osteoarthritis involving multiple joints 05/16/2014   Cataract cortical, senile 05/16/2014   Essential hypertension 09/03/2012   Edema of both legs    Senile osteoporosis    B12 deficiency due to diet    Past Medical History:  Diagnosis Date   Anemia, unspecified    on meds   B12 deficiency due to diet    Basal cell carcinoma    LEFT UPPER ARM SUP. TX=CX3 5FU   Cataract    bilateral sx   Cramp of limb    Edema of both legs    Encounter for long-term (current) use of other medications    Hypertension    on meds   Lumbago    Melanoma (HCC) 10/18/2010   RIGHT POST SHOULDER =MOHS   Obesity    Osteoarthrosis, unspecified whether generalized or localized, unspecified site    generalized   Restless legs syndrome (RLS)    SCC (squamous cell carcinoma) 07/03/2017   RIGHT CHEST CX3 5FU   SCC (squamous cell carcinoma) 01/28/2019   LEFT FOREARM CX3 5FU   SCC (squamous cell carcinoma) [id number]   RIGHT FOREHEAD CX3 5FU   SCC (squamous cell carcinoma) 07/03/2017   RIGHT FOREARM CX3 5FU   SCCA (squamous cell carcinoma) of skin 04/08/2020   Right Anterior Mandible (in situ)(CX35FU)   SCCA (squamous cell carcinoma) of skin 04/08/2020   Left  Supraorbital Region (Keratoacanthoma)   Senile osteoporosis    Squamous cell carcinoma of skin 01/30/2017   RIGHT JAWLINE TX WITH BX   Family History  Problem Relation Age of Onset   Diabetes Father    Prostate cancer Father    Rectal cancer Sister 18   Colon polyps Sister    Cancer Sister    Diabetes Brother    Prostate cancer Brother    Lung cancer Brother 20   Other Brother        declined after a fall   Stomach cancer Maternal Grandmother    Pancreatic cancer Neg Hx    Colon cancer Neg Hx    Esophageal cancer Neg Hx    Past Surgical History:  Procedure Laterality Date   CATARACT EXTRACTION W/ INTRAOCULAR LENS  IMPLANT, BILATERAL  04/2014   COLONOSCOPY     EYE SURGERY Right 04/28/2016   Macular   LEFT HEART CATH AND CORONARY ANGIOGRAPHY N/A 05/15/2023   Procedure: LEFT HEART CATH AND CORONARY ANGIOGRAPHY;  Surgeon: Knox Perl, MD;  Location: [facility];  Service:  Cardiovascular;  Laterality: N/A;   SPINE SURGERY  2005   STOMACH SURGERY  1981   stapleing    TONSILLECTOMY     UPPER GASTROINTESTINAL ENDOSCOPY     VITRECTOMY Right    with membrane peeling for a macular pucker   Social History   Occupational History   Occupation: Retired Nurse, mental health: [employer]  Tobacco Use   Smoking status: Former    Current packs/day: 0.00    Types: Cigarettes    Start date: 1955    Quit date: 1975    Years since quitting: 50.3   Smokeless tobacco: Never   Tobacco comments:    Quit in early 30's  Vaping Use   Vaping status: Never Used  Substance and Sexual Activity   Alcohol use: No    Alcohol/week: 0.0 standard drinks of alcohol   Drug use: No   Sexual activity: Never

## 2023-07-13 NOTE — Progress Notes (Signed)
 Patient says that the injection for her left wrist was great. She says that she had not experienced any numbness in the left hand until yesterday, and that time was not severe and did not last long. She is very happy with the results from that injection.  Patient says that the right wrist and hand do bother her from time to time, but she does not want to do anything for it until it is more bothersome.

## 2023-07-17 ENCOUNTER — Encounter: Payer: Self-pay | Admitting: Nurse Practitioner

## 2023-07-30 ENCOUNTER — Encounter: Payer: Self-pay | Admitting: Sports Medicine

## 2023-07-31 ENCOUNTER — Ambulatory Visit (INDEPENDENT_AMBULATORY_CARE_PROVIDER_SITE_OTHER): Admitting: Nurse Practitioner

## 2023-07-31 ENCOUNTER — Encounter: Payer: Self-pay | Admitting: Nurse Practitioner

## 2023-07-31 VITALS — BP 118/70 | HR 64 | Temp 97.7°F | Resp 17 | Ht 61.0 in | Wt 118.0 lb

## 2023-07-31 DIAGNOSIS — M150 Primary generalized (osteo)arthritis: Secondary | ICD-10-CM

## 2023-07-31 DIAGNOSIS — I10 Essential (primary) hypertension: Secondary | ICD-10-CM | POA: Diagnosis not present

## 2023-07-31 DIAGNOSIS — D509 Iron deficiency anemia, unspecified: Secondary | ICD-10-CM

## 2023-07-31 DIAGNOSIS — M810 Age-related osteoporosis without current pathological fracture: Secondary | ICD-10-CM

## 2023-07-31 DIAGNOSIS — E7800 Pure hypercholesterolemia, unspecified: Secondary | ICD-10-CM

## 2023-07-31 DIAGNOSIS — K582 Mixed irritable bowel syndrome: Secondary | ICD-10-CM | POA: Insufficient documentation

## 2023-07-31 DIAGNOSIS — G629 Polyneuropathy, unspecified: Secondary | ICD-10-CM | POA: Insufficient documentation

## 2023-07-31 NOTE — Progress Notes (Signed)
 Careteam: Patient Care Team: Jody Gobble, NP as PCP - General (Geriatric Medicine) Jody Perl, MD as PCP - Cardiology (Cardiology) Jody [name], OD as Consulting Physician (Optometry) Jody [name], Jody [name], DPM as Consulting Physician (Podiatry) Jody [name], Jody Mems, DO as Referring Physician (Geriatric Medicine) Jody Del, DO as Consulting Physician (Sports Medicine) Jody Gustin, MD as Consulting Physician (Neurology)  PLACE OF SERVICE:  [facility]  Advanced Directive information Does Patient Have a Medical Advance Directive?: Yes, Type of Advance Directive: Healthcare Power of Deal;Living will, Does patient want to make changes to medical advance directive?: No - Patient declined  Allergies  Allergen Reactions   Shrimp [Shellfish Allergy] Anaphylaxis    ALL SEAFOOD   Prolia  [Denosumab ] Other (See Comments)    Chest pain   Percocet [Oxycodone -Acetaminophen ]     Confusion     Chief Complaint  Patient presents with   Establish Care    New patient appointment.    HPI:  -Discussed the use of AI scribe software for clinical note transcription with the patient, who gave verbal consent to proceed.  History of Present Illness Jody [name] is an 83 year old female with osteoporosis and chronic anemia who presents for re-establishment of care.   She experiences persistent pain in the right neck area after eating, ongoing for over three years. The pain resolves only when she lies down on her left side for about thirty minutes, sometimes causing her to fall asleep. Extensive testing, including scopes, swallowing tests, x-rays, and evaluations from ENT to the top of the small intestine, have returned normal results. Angina has been ruled out, but the cause of her symptoms remains undiagnosed.  She has osteoporosis and is currently taking calcium, vitamin D , and Reclast  annually. Previous treatments with Evenity  and Prolia  did not improve bone density. She undergoes  bone density testing every two years.  She experiences chronic anemia and has been on iron supplements since she was young. No bleeding is reported, and she has been unable to donate blood due to low hemoglobin levels.  She takes gabapentin  300 mg, three tablets daily, primarily at night, for neuropathy. This regimen helps her manage symptoms without breakthrough pain during the night.  She uses Celebrex  100 mg daily for osteoarthritis pain, with a second dose as needed. She reports significant pain relief with Celebrex  and notes that missing a dose results in widespread pain. She obtains Celebrex  from a pharmacy in [place], as the generic version is ineffective for her.  She has a history of gut issues, including constipation and diarrhea, and takes Align, a probiotic, which has been beneficial. She underwent stomach stapling in 1981, resulting in significant weight loss, and has maintained a stable weight since then, though she reports difficulty eating and maintaining a balanced diet.  She has a history of skin cancers and follows up with a dermatologist twice a year.    Review of Systems:  Review of Systems  Constitutional:  Negative for chills, fever and weight loss.  HENT:  Negative for tinnitus.   Respiratory:  Negative for cough, sputum production and shortness of breath.   Cardiovascular:  Negative for chest pain, palpitations and leg swelling.  Gastrointestinal:  Negative for abdominal pain, constipation, diarrhea and heartburn.  Genitourinary:  Negative for dysuria, frequency and urgency.  Musculoskeletal:  Positive for joint pain, myalgias and neck pain. Negative for back pain and falls.  Skin: Negative.   Neurological:  Negative for dizziness and headaches.  Psychiatric/Behavioral:  Negative for depression  and memory loss. The patient does not have insomnia.     Past Medical History:  Diagnosis Date   Anemia, unspecified    on meds   B12 deficiency due to diet    Basal  cell carcinoma    LEFT UPPER ARM SUP. TX=CX3 5FU   Cataract    bilateral sx   Cramp of limb    Edema of both legs    Encounter for long-term (current) use of other medications    Hypertension    on meds   Lumbago    Melanoma (HCC) 10/18/2010   RIGHT POST SHOULDER =MOHS   Obesity    Osteoarthrosis, unspecified whether generalized or localized, unspecified site    generalized   Restless legs syndrome (RLS)    SCC (squamous cell carcinoma) 07/03/2017   RIGHT CHEST CX3 5FU   SCC (squamous cell carcinoma) 01/28/2019   LEFT FOREARM CX3 5FU   SCC (squamous cell carcinoma) [id number]   RIGHT FOREHEAD CX3 5FU   SCC (squamous cell carcinoma) 07/03/2017   RIGHT FOREARM CX3 5FU   SCCA (squamous cell carcinoma) of skin 04/08/2020   Right Anterior Mandible (in situ)(CX35FU)   SCCA (squamous cell carcinoma) of skin 04/08/2020   Left Supraorbital Region (Keratoacanthoma)   Senile osteoporosis    Squamous cell carcinoma of skin 01/30/2017   RIGHT JAWLINE TX WITH BX   Past Surgical History:  Procedure Laterality Date   CATARACT EXTRACTION W/ INTRAOCULAR LENS  IMPLANT, BILATERAL  04/2014   COLONOSCOPY     EYE SURGERY Right 04/28/2016   Macular   LEFT HEART CATH AND CORONARY ANGIOGRAPHY N/A 05/15/2023   Procedure: LEFT HEART CATH AND CORONARY ANGIOGRAPHY;  Surgeon: Jody Perl, MD;  Location: [facility];  Service: Cardiovascular;  Laterality: N/A;   SPINE SURGERY  2005   STOMACH SURGERY  1981   stapleing    TONSILLECTOMY     UPPER GASTROINTESTINAL ENDOSCOPY     VITRECTOMY Right    with membrane peeling for a macular pucker   Social History:   reports that she quit smoking about 50 years ago. Her smoking use included cigarettes. She started smoking about 70 years ago. She has never used smokeless tobacco. She reports that she does not drink alcohol and does not use drugs.  Family History  Problem Relation Age of Onset   Diabetes Father    Prostate cancer Father    Rectal cancer  Sister 68   Colon polyps Sister    Cancer Sister    Diabetes Brother    Prostate cancer Brother    Lung cancer Brother 30   Other Brother        declined after a fall   Stomach cancer Maternal Grandmother    Pancreatic cancer Neg Hx    Colon cancer Neg Hx    Esophageal cancer Neg Hx     Medications: Patient's Medications  New Prescriptions   No medications on file  Previous Medications   CALCIUM CARBONATE-VIT D-MIN (CALCIUM 1200 PO)    Take 1 tablet by mouth in the morning.   CELECOXIB  (CELEBREX ) 100 MG CAPSULE    1 tablet by mouth daily, will take additional tablet if needed   DICLOFENAC SODIUM (VOLTAREN ARTHRITIS PAIN) 1 % GEL    Apply 1 Application topically 4 (four) times daily as needed (pain.).   GABAPENTIN  (NEURONTIN ) 300 MG CAPSULE    Take 300 mg by mouth 3 (three) times daily.   IRON 66 MG TABS  Take 66 mg by mouth in the morning.   LIDOCAINE  (LIDODERM ) 5 %    Place 1 patch onto the skin daily as needed (pain.). Remove & Discard patch within 12 hours or as directed by MD   METOPROLOL  SUCCINATE (TOPROL -XL) 25 MG 24 HR TABLET    TAKE 1 TABLET(25 MG) BY MOUTH DAILY   MULTIPLE VITAMIN (MULTIVITAMIN WITH MINERALS) TABS TABLET    Take 1 tablet by mouth in the morning.   NITROGLYCERIN  (NITROSTAT ) 0.4 MG SL TABLET    DISSOLVE 1 TABLET UNDER THE TONGUE EVERY 5 MINUTES AS NEEDED FOR CHEST PAIN   OLOPATADINE (PATADAY) 0.1 % OPHTHALMIC SOLUTION    Place 1 drop into both eyes daily.   PROBIOTIC PRODUCT (ALIGN) 10 MG CAPS    Take 1 tablet by mouth daily.   ZOLEDRONIC  ACID (RECLAST  IV)    as directed. Once yearly, last infusion was November 2024  Modified Medications   No medications on file  Discontinued Medications   BACILLUS COAGULANS-INULIN (ALIGN PREBIOTIC-PROBIOTIC PO)    Take 1 capsule by mouth at bedtime.   CELECOXIB  (CELEBREX ) 100 MG CAPSULE    Take 1 capsule (100 mg total) by mouth 2 (two) times daily as needed.   OMEPRAZOLE  (PRILOSEC) 20 MG CAPSULE    Take 1 capsule (20 mg  total) by mouth daily as needed (Chest pain and heart burn).   SIMVASTATIN  (ZOCOR ) 10 MG TABLET    Take 1 tablet (10 mg total) by mouth at bedtime.    Physical Exam:  Vitals:   07/31/23 0931  BP: 118/70  Pulse: 64  Resp: 17  Temp: 97.7 F (36.5 C)  SpO2: 97%  Weight: 118 lb (53.5 kg)  Height: 5\' 1"  (1.549 m)   Body mass index is 22.3 kg/m. Wt Readings from Last 3 Encounters:  07/31/23 118 lb (53.5 kg)  06/15/23 119 lb (54 kg)  05/15/23 120 lb (54.4 kg)    Physical Exam Constitutional:      General: She is not in acute distress.    Appearance: She is well-developed. She is not diaphoretic.  HENT:     Head: Normocephalic and atraumatic.     Mouth/Throat:     Pharynx: No oropharyngeal exudate.  Eyes:     Conjunctiva/sclera: Conjunctivae normal.     Pupils: Pupils are equal, round, and reactive to light.  Cardiovascular:     Rate and Rhythm: Normal rate and regular rhythm.     Heart sounds: Normal heart sounds.  Pulmonary:     Effort: Pulmonary effort is normal.     Breath sounds: Normal breath sounds.  Abdominal:     General: Bowel sounds are normal.     Palpations: Abdomen is soft.  Musculoskeletal:     Cervical back: Normal range of motion and neck supple.     Right lower leg: No edema.     Left lower leg: No edema.  Skin:    General: Skin is warm and dry.  Neurological:     Mental Status: She is alert.  Psychiatric:        Mood and Affect: Mood normal.     Labs reviewed: Basic Metabolic Panel: Recent Labs    05/11/23 1522  NA 141  K 4.3  CL 103  CO2 23  GLUCOSE 59*  BUN 20  CREATININE 0.87  CALCIUM 9.8   Liver Function Tests: No results for input(s): "AST", "ALT", "ALKPHOS", "BILITOT", "PROT", "ALBUMIN" in the last 8760 hours. No results for input(s): "LIPASE", "  AMYLASE" in the last 8760 hours. No results for input(s): "AMMONIA" in the last 8760 hours. CBC: Recent Labs    05/11/23 1522  WBC 7.3  NEUTROABS 3.7  HGB 11.3  HCT 34.9  MCV  95  PLT 240   Lipid Panel: Recent Labs    02/28/23 1102 05/01/23 1155  CHOL 156 144  HDL 71 74  LDLCALC 69 54  TRIG 88 87  CHOLHDL 2.2 1.9   TSH: No results for input(s): "TSH" in the last 8760 hours. A1C: Lab Results  Component Value Date   HGBA1C 5.5 11/14/2019     Assessment/Plan Essential hypertension Assessment & Plan: Blood pressure well controlled, goal bp <140/90 Continue current medications and dietary modifications follow metabolic panel   Senile osteoporosis Assessment & Plan: Gets reclast  yearly in November Started in 2020 after failed prolia  and evenity .  cont to take calcium 600 mg twice daily with Vitamin D  2000 units daily and weight bearing activity 30 mins/5 days a week     Irritable bowel syndrome with both constipation and diarrhea Assessment & Plan: Controlled with dietary modification and uses align daily    Iron deficiency anemia, unspecified iron deficiency anemia type Assessment & Plan: Continues on iron supplement   Neuropathy Assessment & Plan: Stable on gabapentin  300 mg in the am with 600 mg at bedtime.    Primary osteoarthritis involving multiple joints Assessment & Plan: Ongoing, continues to see sports medicine orthopedic for ongoing management along with celebrex  daily    Pure hypercholesterolemia Assessment & Plan: Followed by cardiology, did not tolerate zocor  so she quit taking.       Return in about 6 months (around 01/31/2024) for routine follow up, labs at time of visit.  Anastasha Ortez K. Denney Fisherman [facility] [phone]

## 2023-07-31 NOTE — Assessment & Plan Note (Signed)
 Controlled with dietary modification and uses align daily

## 2023-07-31 NOTE — Assessment & Plan Note (Signed)
 Ongoing, continues to see sports medicine orthopedic for ongoing management along with celebrex  daily

## 2023-07-31 NOTE — Assessment & Plan Note (Signed)
 Blood pressure well controlled, goal bp <140/90 Continue current medications and dietary modifications follow metabolic panel

## 2023-07-31 NOTE — Assessment & Plan Note (Signed)
 Gets reclast  yearly in November Started in 2020 after failed prolia  and evenity .  cont to take calcium 600 mg twice daily with Vitamin D  2000 units daily and weight bearing activity 30 mins/5 days a week

## 2023-07-31 NOTE — Assessment & Plan Note (Signed)
 Followed by cardiology, did not tolerate zocor  so she quit taking.

## 2023-07-31 NOTE — Assessment & Plan Note (Signed)
 Continues on iron supplement

## 2023-07-31 NOTE — Assessment & Plan Note (Signed)
 Stable on gabapentin  300 mg in the am with 600 mg at bedtime.

## 2023-08-01 ENCOUNTER — Encounter: Payer: Self-pay | Admitting: Nurse Practitioner

## 2023-08-10 ENCOUNTER — Encounter: Payer: Self-pay | Admitting: Sports Medicine

## 2023-08-10 ENCOUNTER — Other Ambulatory Visit (INDEPENDENT_AMBULATORY_CARE_PROVIDER_SITE_OTHER): Payer: Self-pay

## 2023-08-10 ENCOUNTER — Ambulatory Visit: Admitting: Sports Medicine

## 2023-08-10 DIAGNOSIS — M21061 Valgus deformity, not elsewhere classified, right knee: Secondary | ICD-10-CM | POA: Diagnosis not present

## 2023-08-10 DIAGNOSIS — G8929 Other chronic pain: Secondary | ICD-10-CM

## 2023-08-10 DIAGNOSIS — G5602 Carpal tunnel syndrome, left upper limb: Secondary | ICD-10-CM

## 2023-08-10 DIAGNOSIS — M25562 Pain in left knee: Secondary | ICD-10-CM | POA: Diagnosis not present

## 2023-08-10 DIAGNOSIS — M25561 Pain in right knee: Secondary | ICD-10-CM

## 2023-08-10 DIAGNOSIS — M170 Bilateral primary osteoarthritis of knee: Secondary | ICD-10-CM

## 2023-08-10 DIAGNOSIS — M21062 Valgus deformity, not elsewhere classified, left knee: Secondary | ICD-10-CM

## 2023-08-10 MED ORDER — BUPIVACAINE HCL 0.25 % IJ SOLN
2.0000 mL | INTRAMUSCULAR | Status: AC | PRN
  Administered 2023-08-10: 2 mL via INTRA_ARTICULAR

## 2023-08-10 MED ORDER — LIDOCAINE HCL 1 % IJ SOLN
2.0000 mL | INTRAMUSCULAR | Status: AC | PRN
  Administered 2023-08-10: 2 mL

## 2023-08-10 MED ORDER — METHYLPREDNISOLONE ACETATE 40 MG/ML IJ SUSP
40.0000 mg | INTRAMUSCULAR | Status: AC | PRN
  Administered 2023-08-10: 40 mg via INTRA_ARTICULAR

## 2023-08-10 NOTE — Progress Notes (Signed)
 Jody [name] - 83 y.o. female MRN [id number]  Date of birth: 1940/06/08  Office Visit Note: Visit Date: 08/10/2023 PCP: Verma Gobble, NP Referred by: Verma Gobble, NP  Subjective: Chief Complaint  Patient presents with   Right Knee - Pain   Left Knee - Pain   HPI: Jody [name] is a pleasant 83 y.o. female who presents today for chronic bilateral knee pain, L > R.  She has had bilateral knee pain for many years, the left is worse than the right.  She has had a set of injections as well as viscosupplementation/gel injections in the past.  The first round of gel injections gave her really good relief, the most recent was back in December 2024 which helped some.  She and other friends have noticed that she has become more knock kneed.  She continues with her Celebrex  100 mg daily.  The left carpal tunnel still much improved from previous injection months ago but is slowly starting to return.  Pertinent ROS were reviewed with the patient and found to be negative unless otherwise specified above in HPI.   Assessment & Plan: Visit Diagnoses:  1. Bilateral primary osteoarthritis of knee   2. Chronic pain of right knee   3. Chronic pain of left knee   4. Acquired genu valgum, bilateral   5. Carpal tunnel syndrome, left upper limb    Plan: Impression is bilateral knee pain with severe, bone-on-bone arthritic change of the lateral joint line of each knee.  The left knee is most symptomatic today and she does have associated genu valgum which is likely acquired from her degenerative change about the knees.  Through shared decision-making, we did proceed with corticosteroid injection, patient tolerated well.  She has received relief in the past with viscosupplementation, we will send off for authorization of these and we will plan to proceed with these later in June which would be just over 6 months from her previous.  Given her genu valgum, we did fit her for a lateral unloader knee  brace for the left knee today.  She will follow-up with me in June to see how she is responding to this as well as her aquatic-based PT.  We will have her continue her Celebrex  100 mg daily.  Discussed always considering repeat ultrasound-guided carpal tunnel injection in the future if needed, but given that her pain is only mild she will hold on this for now.  This patient is diagnosed with osteoarthritis of the bilateral knees.    Radiographs show evidence of joint space narrowing, osteophytes, subchondral sclerosis and/or subchondral cysts.  This patient has knee pain which interferes with functional and activities of daily living.    This patient has experienced inadequate response, adverse effects and/or intolerance with conservative treatments such as acetaminophen , NSAIDS, topical creams, physical therapy or regular exercise, knee bracing and/or weight loss.   This patient has experienced inadequate response or has a contraindication to intra articular steroid injections for at least 3 months.   This patient is not scheduled to have a total knee replacement within 6 months of starting treatment with viscosupplementation.   Follow-up: Return in about 6 weeks (around 09/21/2023) for will message regarding dates for gel injs (30-min).   Meds & Orders: No orders of the defined types were placed in this encounter.   Orders Placed This Encounter  Procedures   Large Joint Inj   XR Knee Complete 4 Views Left   XR Knee 1-2  Views Right     Procedures: Large Joint Inj: L knee on 08/10/2023 11:29 AM Indications: pain Details: 22 G 1.5 in needle, anterolateral approach Medications: 2 mL lidocaine  1 %; 2 mL bupivacaine  0.25 %; 40 mg methylPREDNISolone  acetate 40 MG/ML Outcome: tolerated well, no immediate complications  Knee Injection, Left: After discussion on risks/benefits/indications, informed verbal consent was obtained and a timeout was performed, patient was seated on exam table. The  patient's knee was prepped with Betadine and alcohol swab and utilizing anterolateral approach, the patient's knee was injected intraarticularly with 2:2:1 lidocaine  1%:bupivicaine 0.25%:depomedrol. Patient tolerated the procedure well without immediate complications.  Procedure, treatment alternatives, risks and benefits explained, specific risks discussed. Consent was given by the patient. Patient was prepped and draped in the usual sterile fashion.          Clinical History: No specialty comments available.  She reports that she quit smoking about 50 years ago. Her smoking use included cigarettes. She started smoking about 70 years ago. She has never used smokeless tobacco. No results for input(s): "HGBA1C", "LABURIC" in the last 8760 hours.  Objective:    Physical Exam  Gen: Well-appearing, in no acute distress; non-toxic CV: Well-perfused. Warm.  Resp: Breathing unlabored on room air; no wheezing. Psych: Fluid speech in conversation; appropriate affect; normal thought process  Ortho Exam - Bilateral knees: No significant redness swelling or effusion of either knee.  There is medial and lateral joint line TTP on the left greater than right knee.  Range of motion 0-125 degrees of the left and 0-130 degrees of the right.  There is acquired genu valgum with associated horizontal Xu knee of the left greater than right knee with ambulation phase.  There is pseudo instability with varus/valgus stress testing of the left and right knee.   Imaging: XR Knee Complete 4 Views Left Result Date: 08/10/2023 4 views of bilateral knees including standing AP, Rosenberg, lateral and sunrise views were ordered and reviewed by myself today.  X-rays demonstrate severe bone-on-bone collapse of the lateral tibiofemoral joint space.  There is notable subchondral sclerosis of the lateral femoral condyle and lateral tibial plateau.  The knee is fall into a valgus fashion bilaterally.  Lateral view of the left  knee shows healed fragmentation of the patella likely from prior fracture.  No acute fracture or acute bony abnormality otherwise noted.  XR Knee 1-2 Views Right Result Date: 08/10/2023 4 views of bilateral knees including standing AP, Rosenberg, lateral and sunrise views were ordered and reviewed by myself today.  X-rays demonstrate severe bone-on-bone collapse of the lateral tibiofemoral joint space.  There is notable subchondral sclerosis of the lateral femoral condyle and lateral tibial plateau.  The knee is fall into a valgus fashion bilaterally.  Lateral view of the left knee shows healed fragmentation of the patella likely from prior fracture.  No acute fracture or acute bony abnormality otherwise noted.    Past Medical/Family/Surgical/Social History: Medications & Allergies reviewed per EMR, new medications updated. Patient Active Problem List   Diagnosis Date Noted   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   Neuropathy 07/31/2023   Pure hypercholesterolemia 11/14/2019   Gastroesophageal reflux disease 02/04/2019   Lumbar back pain with radiculopathy affecting left lower extremity 04/11/2016   Cervical spondylosis without myelopathy 06/08/2015   Absolute anemia 05/16/2014   Primary osteoarthritis involving multiple joints 05/16/2014   Essential hypertension 09/03/2012   Senile osteoporosis    B12 deficiency due to diet  Past Medical History:  Diagnosis Date   Anemia, unspecified    on meds   B12 deficiency due to diet    Basal cell carcinoma    LEFT UPPER ARM SUP. TX=CX3 5FU   Cataract    bilateral sx   Cramp of limb    Edema of both legs    Encounter for long-term (current) use of other medications    Hypertension    on meds   Lumbago    Melanoma (HCC) 10/18/2010   RIGHT POST SHOULDER =MOHS   Obesity    Osteoarthrosis, unspecified whether generalized or localized, unspecified site    generalized   Restless legs syndrome (RLS)     SCC (squamous cell carcinoma) 07/03/2017   RIGHT CHEST CX3 5FU   SCC (squamous cell carcinoma) 01/28/2019   LEFT FOREARM CX3 5FU   SCC (squamous cell carcinoma) [id number]   RIGHT FOREHEAD CX3 5FU   SCC (squamous cell carcinoma) 07/03/2017   RIGHT FOREARM CX3 5FU   SCCA (squamous cell carcinoma) of skin 04/08/2020   Right Anterior Mandible (in situ)(CX35FU)   SCCA (squamous cell carcinoma) of skin 04/08/2020   Left Supraorbital Region (Keratoacanthoma)   Senile osteoporosis    Squamous cell carcinoma of skin 01/30/2017   RIGHT JAWLINE TX WITH BX   Family History  Problem Relation Age of Onset   Diabetes Father    Prostate cancer Father    Rectal cancer Sister 73   Colon polyps Sister    Cancer Sister    Diabetes Brother    Prostate cancer Brother    Lung cancer Brother 78   Other Brother        declined after a fall   Stomach cancer Maternal Grandmother    Pancreatic cancer Neg Hx    Colon cancer Neg Hx    Esophageal cancer Neg Hx    Past Surgical History:  Procedure Laterality Date   CATARACT EXTRACTION W/ INTRAOCULAR LENS  IMPLANT, BILATERAL  04/2014   COLONOSCOPY     EYE SURGERY Right 04/28/2016   Macular   LEFT HEART CATH AND CORONARY ANGIOGRAPHY N/A 05/15/2023   Procedure: LEFT HEART CATH AND CORONARY ANGIOGRAPHY;  Surgeon: Knox Perl, MD;  Location: [facility];  Service: Cardiovascular;  Laterality: N/A;   SPINE SURGERY  2005   STOMACH SURGERY  1981   stapleing    TONSILLECTOMY     UPPER GASTROINTESTINAL ENDOSCOPY     VITRECTOMY Right    with membrane peeling for a macular pucker   Social History   Occupational History   Occupation: Retired Nurse, mental health: [employer]  Tobacco Use   Smoking status: Former    Current packs/day: 0.00    Types: Cigarettes    Start date: 1955    Quit date: 1975    Years since quitting: 50.4   Smokeless tobacco: Never   Tobacco comments:    Quit in early 30's  Vaping Use   Vaping status:  Never Used  Substance and Sexual Activity   Alcohol use: No    Alcohol/week: 0.0 standard drinks of alcohol   Drug use: No   Sexual activity: Never

## 2023-08-10 NOTE — Progress Notes (Signed)
 Patient says that she has had knee pain for years, and that the left is worse than the right. She does say that lately, she has felt like the left knee will give out on her, which is new. She has had gel injections twice before, with the first round giving her nearly 6 months of relief, and the second round being less successful. She did the three-week series of gel injections each of these times. She is currently working with Milton Alpers and [facility] who recommended she have the knees reevaluated. She does mention having fractured the left patella in 2017.

## 2023-08-12 ENCOUNTER — Other Ambulatory Visit: Payer: Self-pay | Admitting: Cardiology

## 2023-08-12 DIAGNOSIS — R079 Chest pain, unspecified: Secondary | ICD-10-CM

## 2023-08-16 ENCOUNTER — Encounter: Payer: Self-pay | Admitting: Nurse Practitioner

## 2023-08-22 ENCOUNTER — Telehealth: Payer: Self-pay

## 2023-08-22 NOTE — Telephone Encounter (Signed)
 VOB has been submitted for Monovisc, bilateral knee. Next available gel injection would need to be after 09/11/23.

## 2023-08-22 NOTE — Telephone Encounter (Signed)
-----   Message from [place] sent at 08/10/2023  4:42 PM EDT ----- Regarding: Gel Injections - Bilateral Knees Hey Eladio Dentremont,  Could we please send for authorization for this patient? Bilateral knees.   She did have gel injections at another practice on 03/13/2023.  Thanks! Lonzell Robin

## 2023-09-18 ENCOUNTER — Telehealth: Payer: Self-pay

## 2023-09-18 DIAGNOSIS — M170 Bilateral primary osteoarthritis of knee: Secondary | ICD-10-CM

## 2023-09-18 NOTE — Telephone Encounter (Signed)
 Called and left a VM advising patient to CB to schedule for gel injection with Dr. Vaughn Georges.  See referrals tab

## 2023-09-21 ENCOUNTER — Encounter: Payer: Self-pay | Admitting: Sports Medicine

## 2023-09-22 ENCOUNTER — Other Ambulatory Visit: Payer: Self-pay

## 2023-09-22 DIAGNOSIS — M170 Bilateral primary osteoarthritis of knee: Secondary | ICD-10-CM

## 2023-09-27 ENCOUNTER — Ambulatory Visit (INDEPENDENT_AMBULATORY_CARE_PROVIDER_SITE_OTHER): Admitting: Sports Medicine

## 2023-09-27 ENCOUNTER — Encounter: Payer: Self-pay | Admitting: Sports Medicine

## 2023-09-27 VITALS — BP 102/62 | HR 69 | Temp 97.9°F | Ht 60.87 in | Wt 118.4 lb

## 2023-09-27 DIAGNOSIS — R5383 Other fatigue: Secondary | ICD-10-CM

## 2023-09-27 DIAGNOSIS — M159 Polyosteoarthritis, unspecified: Secondary | ICD-10-CM

## 2023-09-27 DIAGNOSIS — M810 Age-related osteoporosis without current pathological fracture: Secondary | ICD-10-CM | POA: Diagnosis not present

## 2023-09-27 DIAGNOSIS — G629 Polyneuropathy, unspecified: Secondary | ICD-10-CM | POA: Diagnosis not present

## 2023-09-27 MED ORDER — GABAPENTIN 300 MG PO CAPS: 300.0000 mg | ORAL_CAPSULE | Freq: Three times a day (TID) | ORAL | 3 refills | Status: AC

## 2023-09-27 NOTE — Progress Notes (Signed)
 Careteam: Patient Care Team: Caro Harlene POUR, NP as PCP - General (Geriatric Medicine) Ladona Heinz, MD as PCP - Cardiology (Cardiology) Cleotilde Sewer, OD as Consulting Physician (Optometry) Regal, Pasco RAMAN, DPM as Consulting Physician (Podiatry) Combs, Isaiah Bottcher, DO as Referring Physician (Geriatric Medicine) Burnetta Brunet, DO as Consulting Physician (Sports Medicine) Leigh Venetia CROME, MD as Consulting Physician (Neurology)  PLACE OF SERVICE:  [facility]  Advanced Directive information    Allergies  Allergen Reactions   Shrimp [Shellfish Allergy] Anaphylaxis    ALL SEAFOOD   Prolia  [Denosumab ] Other (See Comments)    Chest pain   Percocet [Oxycodone -Acetaminophen ]     Confusion     Chief Complaint  Patient presents with   Acute Visit    Patient is experiencing fatigue has been noticing it for a couple of months.     Discussed the use of AI scribe software for clinical note transcription with the patient, who gave verbal consent to proceed.  History of Present Illness  Jody [name] is an 83 year old female who presents with fatigue and decreased energy levels.  Over the past few months, she has experienced a significant decrease in energy levels . This change is unusual for her as she has always been active and never needed to sit or nap during the day. Recently, she finds herself needing to take one or two naps daily, which is unprecedented for her. No fever, night sweats, chills, cough, or breathing difficulties. She reports a runny nose with clear drainage but no post-nasal drip or sore throat. No chest pain or heart palpitations. Her mood is stable, and she does not feel depressed.  She has a history of weight issues . She typically eats two meals a day and has not noticed a significant change in her appetite or portion sizes. She has a long-standing issue with feeling like there is something in her throat after eating, which has not been diagnosed despite  previous tests. No nausea or vomiting.     She experiences joint pain 'everywhere' due to arthritis, which she manages without surgery despite recommendations for multiple joint surgeries. She engages in water therapy to maintain mobility without exacerbating her pain. She denies dizziness or lightheadedness unless dehydrated. Her current medications include metoprolol , which she has been taking for less than a year, Celebrex  for joint pain, gabapentin  three times a day, and supplements like calcium, vitamin D , iron tablets, and multivitamins. She receives Reclast  infusions annually for osteoporosis.     Review of Systems:  Review of Systems  HENT:  Negative for congestion, sinus pain and sore throat.   Respiratory:  Positive for cough. Negative for shortness of breath.   Cardiovascular:  Negative for chest pain, palpitations and leg swelling.  Gastrointestinal:  Negative for constipation, diarrhea, heartburn, nausea and vomiting.  Genitourinary:  Negative for dysuria and hematuria.  Musculoskeletal:  Positive for joint pain.  Neurological:  Negative for dizziness.   Negative unless indicated in HPI.   Past Medical History:  Diagnosis Date   Anemia, unspecified    on meds   B12 deficiency due to diet    Basal cell carcinoma    LEFT UPPER ARM SUP. TX=CX3 5FU   Cataract    bilateral sx   Cramp of limb    Edema of both legs    Encounter for long-term (current) use of other medications    Hypertension    on meds   Lumbago    Melanoma (HCC) 10/18/2010   RIGHT  POST SHOULDER =MOHS   Obesity    Osteoarthrosis, unspecified whether generalized or localized, unspecified site    generalized   Restless legs syndrome (RLS)    SCC (squamous cell carcinoma) 07/03/2017   RIGHT CHEST CX3 5FU   SCC (squamous cell carcinoma) 01/28/2019   LEFT FOREARM CX3 5FU   SCC (squamous cell carcinoma) [id number]   RIGHT FOREHEAD CX3 5FU   SCC (squamous cell carcinoma) 07/03/2017   RIGHT FOREARM CX3 5FU    SCCA (squamous cell carcinoma) of skin 04/08/2020   Right Anterior Mandible (in situ)(CX35FU)   SCCA (squamous cell carcinoma) of skin 04/08/2020   Left Supraorbital Region (Keratoacanthoma)   Senile osteoporosis    Squamous cell carcinoma of skin 01/30/2017   RIGHT JAWLINE TX WITH BX   Past Surgical History:  Procedure Laterality Date   CATARACT EXTRACTION W/ INTRAOCULAR LENS  IMPLANT, BILATERAL  04/2014   COLONOSCOPY     EYE SURGERY Right 04/28/2016   Macular   LEFT HEART CATH AND CORONARY ANGIOGRAPHY N/A 05/15/2023   Procedure: LEFT HEART CATH AND CORONARY ANGIOGRAPHY;  Surgeon: Ladona Heinz, MD;  Location: [facility];  Service: Cardiovascular;  Laterality: N/A;   SPINE SURGERY  2005   STOMACH SURGERY  1981   stapleing    TONSILLECTOMY     UPPER GASTROINTESTINAL ENDOSCOPY     VITRECTOMY Right    with membrane peeling for a macular pucker   Social History:   reports that she quit smoking about 50 years ago. Her smoking use included cigarettes. She started smoking about 70 years ago. She has never used smokeless tobacco. She reports that she does not drink alcohol and does not use drugs.  Family History  Problem Relation Age of Onset   Diabetes Father    Prostate cancer Father    Rectal cancer Sister 19   Colon polyps Sister    Cancer Sister    Diabetes Brother    Prostate cancer Brother    Lung cancer Brother 61   Other Brother        declined after a fall   Stomach cancer Maternal Grandmother    Pancreatic cancer Neg Hx    Colon cancer Neg Hx    Esophageal cancer Neg Hx     Medications: Patient's Medications  New Prescriptions   No medications on file  Previous Medications   CALCIUM CARBONATE-VIT D-MIN (CALCIUM 1200 PO)    Take 1 tablet by mouth in the morning.   CELECOXIB  (CELEBREX ) 100 MG CAPSULE    1 tablet by mouth daily, will take additional tablet if needed   DICLOFENAC SODIUM (VOLTAREN ARTHRITIS PAIN) 1 % GEL    Apply 1 Application topically 4  (four) times daily as needed (pain.).   GABAPENTIN  (NEURONTIN ) 300 MG CAPSULE    Take 300 mg by mouth 3 (three) times daily.   IRON 66 MG TABS    Take 66 mg by mouth in the morning.   LIDOCAINE  (LIDODERM ) 5 %    Place 1 patch onto the skin daily as needed (pain.). Remove & Discard patch within 12 hours or as directed by MD   METOPROLOL  SUCCINATE (TOPROL -XL) 25 MG 24 HR TABLET    TAKE 1 TABLET(25 MG) BY MOUTH DAILY   MULTIPLE VITAMIN (MULTIVITAMIN WITH MINERALS) TABS TABLET    Take 1 tablet by mouth in the morning.   NITROGLYCERIN  (NITROSTAT ) 0.4 MG SL TABLET    DISSOLVE 1 TABLET UNDER THE TONGUE EVERY 5 MINUTES AS NEEDED  FOR CHEST PAIN   OLOPATADINE (PATADAY) 0.1 % OPHTHALMIC SOLUTION    Place 1 drop into both eyes daily.   PROBIOTIC PRODUCT (ALIGN) 10 MG CAPS    Take 1 tablet by mouth daily.   ZOLEDRONIC  ACID (RECLAST  IV)    as directed. Once yearly, last infusion was November 2024  Modified Medications   No medications on file  Discontinued Medications   No medications on file    Physical Exam: Vitals:   09/27/23 1329  BP: 102/62  Pulse: 69  Temp: 97.9 F (36.6 C)  SpO2: 95%  Weight: 118 lb 6.4 oz (53.7 kg)  Height: 5' 0.87 (1.546 m)   Body mass index is 22.47 kg/m. BP Readings from Last 3 Encounters:  09/27/23 102/62  07/31/23 118/70  06/15/23 114/68   Wt Readings from Last 3 Encounters:  09/27/23 118 lb 6.4 oz (53.7 kg)  07/31/23 118 lb (53.5 kg)  06/15/23 119 lb (54 kg)    Physical Exam Constitutional:      Appearance: Normal appearance.  HENT:     Head: Normocephalic and atraumatic.  Cardiovascular:     Rate and Rhythm: Normal rate and regular rhythm.  Pulmonary:     Effort: Pulmonary effort is normal. No respiratory distress.     Breath sounds: Normal breath sounds. No wheezing.  Abdominal:     General: Bowel sounds are normal. There is no distension.     Tenderness: There is no abdominal tenderness. There is no guarding or rebound.     Comments:     Musculoskeletal:        General: No swelling or tenderness.     Comments: Left knee  joint line tenderness, wearing  brace  Neurological:     Mental Status: She is alert. Mental status is at baseline.     Motor: No weakness.     Labs reviewed: Basic Metabolic Panel: Recent Labs    05/11/23 1522  NA 141  K 4.3  CL 103  CO2 23  GLUCOSE 59*  BUN 20  CREATININE 0.87  CALCIUM 9.8   Liver Function Tests: No results for input(s): AST, ALT, ALKPHOS, BILITOT, PROT, ALBUMIN in the last 8760 hours. No results for input(s): LIPASE, AMYLASE in the last 8760 hours. No results for input(s): AMMONIA in the last 8760 hours. CBC: Recent Labs    05/11/23 1522  WBC 7.3  NEUTROABS 3.7  HGB 11.3  HCT 34.9  MCV 95  PLT 240   Lipid Panel: Recent Labs    02/28/23 1102 05/01/23 1155  CHOL 156 144  HDL 71 74  LDLCALC 69 54  TRIG 88 87  CHOLHDL 2.2 1.9   TSH: No results for input(s): TSH in the last 8760 hours. A1C: Lab Results  Component Value Date   HGBA1C 5.5 11/14/2019    Assessment and Plan Assessment & Plan   1. Other fatigue (Primary) Denies being depressed Weight stable Denies fevers, night sweats , cough, sob, hematuria, bloody or dark stools Will check labs  - CBC (no diff) - Complete Metabolic Panel with eGFR - TSH   2. Neuropathy Cont with gabapentin    3. Generalized OA Take tylenol  prn   4. Senile osteoporosis On reclast    Other orders - gabapentin  (NEURONTIN ) 300 MG capsule; Take 1 capsule (300 mg total) by mouth 3 (three) times daily.  Dispense: 270 capsule; Refill: 3

## 2023-09-28 ENCOUNTER — Ambulatory Visit: Payer: Self-pay | Admitting: Sports Medicine

## 2023-09-28 LAB — COMPREHENSIVE METABOLIC PANEL WITH GFR
AG Ratio: 1.7 (calc) (ref 1.0–2.5)
ALT: 16 U/L (ref 6–29)
AST: 34 U/L (ref 10–35)
Albumin: 4.4 g/dL (ref 3.6–5.1)
Alkaline phosphatase (APISO): 48 U/L (ref 37–153)
BUN/Creatinine Ratio: 20 (calc) (ref 6–22)
BUN: 21 mg/dL (ref 7–25)
CO2: 25 mmol/L (ref 20–32)
Calcium: 9.4 mg/dL (ref 8.6–10.4)
Chloride: 103 mmol/L (ref 98–110)
Creat: 1.03 mg/dL — ABNORMAL HIGH (ref 0.60–0.95)
Globulin: 2.6 g/dL (ref 1.9–3.7)
Glucose, Bld: 148 mg/dL — ABNORMAL HIGH (ref 65–99)
Potassium: 4 mmol/L (ref 3.5–5.3)
Sodium: 138 mmol/L (ref 135–146)
Total Bilirubin: 0.5 mg/dL (ref 0.2–1.2)
Total Protein: 7 g/dL (ref 6.1–8.1)
eGFR: 53 mL/min/{1.73_m2} — ABNORMAL LOW (ref >=60)

## 2023-09-28 LAB — CBC
HCT: 34.8 % — ABNORMAL LOW (ref 35.0–45.0)
Hemoglobin: 10.9 g/dL — ABNORMAL LOW (ref 11.7–15.5)
MCH: 30.2 pg (ref 27.0–33.0)
MCHC: 31.3 g/dL — ABNORMAL LOW (ref 32.0–36.0)
MCV: 96.4 fL (ref 80.0–100.0)
MPV: 9.5 fL (ref 7.5–12.5)
Platelets: 325 10*3/uL (ref 140–400)
RBC: 3.61 10*6/uL — ABNORMAL LOW (ref 3.80–5.10)
RDW: 12.4 % (ref 11.0–15.0)
WBC: 6.4 10*3/uL (ref 3.8–10.8)

## 2023-09-28 LAB — IRON,TIBC AND FERRITIN PANEL
%SAT: 22 % (ref 16–45)
Ferritin: 245 ng/mL (ref 16–288)
Iron: 74 ug/dL (ref 45–160)
TIBC: 335 ug/dL (ref 250–450)

## 2023-09-28 LAB — TEST AUTHORIZATION

## 2023-09-28 LAB — TSH: TSH: 2.39 m[IU]/L (ref 0.40–4.50)

## 2023-09-30 ENCOUNTER — Encounter: Payer: Self-pay | Admitting: Sports Medicine

## 2023-09-30 ENCOUNTER — Encounter: Payer: Self-pay | Admitting: Cardiology

## 2023-10-02 NOTE — Telephone Encounter (Signed)
 Called and spoke with patient and scheduled an appointment for 10/04/2023 with Harlene.

## 2023-10-04 ENCOUNTER — Ambulatory Visit (INDEPENDENT_AMBULATORY_CARE_PROVIDER_SITE_OTHER): Admitting: Nurse Practitioner

## 2023-10-04 ENCOUNTER — Encounter: Payer: Self-pay | Admitting: Nurse Practitioner

## 2023-10-04 VITALS — BP 138/76 | HR 73 | Temp 97.6°F | Ht 60.09 in | Wt 117.8 lb

## 2023-10-04 DIAGNOSIS — D509 Iron deficiency anemia, unspecified: Secondary | ICD-10-CM

## 2023-10-04 DIAGNOSIS — I10 Essential (primary) hypertension: Secondary | ICD-10-CM

## 2023-10-04 MED ORDER — LISINOPRIL 5 MG PO TABS: 5.0000 mg | ORAL_TABLET | Freq: Every day | ORAL | 3 refills | Status: AC

## 2023-10-04 NOTE — Progress Notes (Signed)
 Careteam: Patient Care Team: Caro Harlene POUR, NP as PCP - General (Geriatric Medicine) Ladona Heinz, MD as PCP - Cardiology (Cardiology) Cleotilde Sewer, OD as Consulting Physician (Optometry) Regal, Pasco RAMAN, DPM as Consulting Physician (Podiatry) Combs, Isaiah Bottcher, DO as Referring Physician (Geriatric Medicine) Burnetta Brunet, DO as Consulting Physician (Sports Medicine) Leigh Venetia CROME, MD as Consulting Physician (Neurology)  PLACE OF SERVICE:  [facility]  Advanced Directive information Does Patient Have a Medical Advance Directive?: Yes, Type of Advance Directive: Healthcare Power of [place];Living will, Does patient want to make changes to medical advance directive?: No - Patient declined  Allergies  Allergen Reactions   Shrimp [Shellfish Allergy] Anaphylaxis    ALL SEAFOOD   Prolia  [Denosumab ] Other (See Comments)    Chest pain   Percocet [Oxycodone -Acetaminophen ]     Confusion     Chief Complaint  Patient presents with   Medication Management    Medication Management. Wants to start Lisinopril  and discuss Labwork.     HPI:  Discussed the use of AI scribe software for clinical note transcription with the patient, who gave verbal consent to proceed.  History of Present Illness Jody [name] is an 83 year old female with hypertension who presents with concerns about blood pressure and lab results.  She has been experiencing extreme fatigue for the past three months, significantly impacting her daily activities. She has difficulty staying focused while driving, requiring frequent breaks to walk around and refresh herself. She often feels like she might fall asleep without reason and needs to sit down frequently.  Recent lab work showed a hemoglobin level of 10.9, slightly lower than her previous level of 11.3, and a mildly low red blood cell count of 3.61. Her iron levels, iron binding capacity, and ferritin are normal. She has a history of iron deficiency anemia and  is currently on iron supplementation. Her glucose level was noted to be high, but she attributes this to not fasting before the test. Her thyroid  function is normal with a TSH of 2.39. She acknowledges that her kidney function is slightly worse than a year ago, possibly due to dehydration, as she admits to not drinking enough water despite carrying a water bottle.  She has a history of taking metoprolol , which she believes contributed to her fatigue. After stopping metoprolol , she reports an improvement in her fatigue symptoms. She previously took lisinopril  5 mg daily for blood pressure management and wishes to resume this medication, as she has not taken any since stopping metoprolol .  She recalls a past experience in 2014 when she felt like she was having a myocardial infarction, which was later attributed to a medication for osteoporosis. Her heart evaluations at that time were normal. She recently underwent another heart evaluation, which was also normal. No recent palpitations.   Review of Systems:  Review of Systems  Constitutional:  Negative for chills, fever and weight loss.  HENT:  Negative for tinnitus.   Respiratory:  Negative for cough, sputum production and shortness of breath.   Cardiovascular:  Negative for chest pain, palpitations and leg swelling.  Gastrointestinal:  Negative for abdominal pain, constipation, diarrhea and heartburn.  Genitourinary:  Negative for dysuria, frequency and urgency.  Musculoskeletal:  Negative for back pain, falls, joint pain and myalgias.  Skin: Negative.   Neurological:  Negative for dizziness and headaches.  Psychiatric/Behavioral:  Negative for depression and memory loss. The patient does not have insomnia.     Past Medical History:  Diagnosis Date  Anemia, unspecified    on meds   B12 deficiency due to diet    Basal cell carcinoma    LEFT UPPER ARM SUP. TX=CX3 5FU   Cataract    bilateral sx   Cramp of limb    Edema of both legs     Encounter for long-term (current) use of other medications    Hypertension    on meds   Lumbago    Melanoma (HCC) 10/18/2010   RIGHT POST SHOULDER =MOHS   Obesity    Osteoarthrosis, unspecified whether generalized or localized, unspecified site    generalized   Restless legs syndrome (RLS)    SCC (squamous cell carcinoma) 07/03/2017   RIGHT CHEST CX3 5FU   SCC (squamous cell carcinoma) 01/28/2019   LEFT FOREARM CX3 5FU   SCC (squamous cell carcinoma) [id number]   RIGHT FOREHEAD CX3 5FU   SCC (squamous cell carcinoma) 07/03/2017   RIGHT FOREARM CX3 5FU   SCCA (squamous cell carcinoma) of skin 04/08/2020   Right Anterior Mandible (in situ)(CX35FU)   SCCA (squamous cell carcinoma) of skin 04/08/2020   Left Supraorbital Region (Keratoacanthoma)   Senile osteoporosis    Squamous cell carcinoma of skin 01/30/2017   RIGHT JAWLINE TX WITH BX   Past Surgical History:  Procedure Laterality Date   CATARACT EXTRACTION W/ INTRAOCULAR LENS  IMPLANT, BILATERAL  04/2014   COLONOSCOPY     EYE SURGERY Right 04/28/2016   Macular   LEFT HEART CATH AND CORONARY ANGIOGRAPHY N/A 05/15/2023   Procedure: LEFT HEART CATH AND CORONARY ANGIOGRAPHY;  Surgeon: Ladona Heinz, MD;  Location: [facility];  Service: Cardiovascular;  Laterality: N/A;   SPINE SURGERY  2005   STOMACH SURGERY  1981   stapleing    TONSILLECTOMY     UPPER GASTROINTESTINAL ENDOSCOPY     VITRECTOMY Right    with membrane peeling for a macular pucker   Social History:   reports that she quit smoking about 50 years ago. Her smoking use included cigarettes. She started smoking about 70 years ago. She has never used smokeless tobacco. She reports that she does not drink alcohol and does not use drugs.  Family History  Problem Relation Age of Onset   Diabetes Father    Prostate cancer Father    Rectal cancer Sister 59   Colon polyps Sister    Cancer Sister    Diabetes Brother    Prostate cancer Brother    Lung cancer  Brother 56   Other Brother        declined after a fall   Stomach cancer Maternal Grandmother    Pancreatic cancer Neg Hx    Colon cancer Neg Hx    Esophageal cancer Neg Hx     Medications: Patient's Medications  New Prescriptions   No medications on file  Previous Medications   CALCIUM CARBONATE-VIT D-MIN (CALCIUM 1200 PO)    Take 1 tablet by mouth in the morning.   CELECOXIB  (CELEBREX ) 100 MG CAPSULE    1 tablet by mouth daily, will take additional tablet if needed   DICLOFENAC SODIUM (VOLTAREN ARTHRITIS PAIN) 1 % GEL    Apply 1 Application topically 4 (four) times daily as needed (pain.).   GABAPENTIN  (NEURONTIN ) 300 MG CAPSULE    Take 1 capsule (300 mg total) by mouth 3 (three) times daily.   IRON 66 MG TABS    Take 66 mg by mouth in the morning.   LIDOCAINE  (LIDODERM ) 5 %  Place 1 patch onto the skin daily as needed (pain.). Remove & Discard patch within 12 hours or as directed by MD   METOPROLOL  SUCCINATE (TOPROL -XL) 25 MG 24 HR TABLET    TAKE 1 TABLET(25 MG) BY MOUTH DAILY   MULTIPLE VITAMIN (MULTIVITAMIN WITH MINERALS) TABS TABLET    Take 1 tablet by mouth in the morning.   NITROGLYCERIN  (NITROSTAT ) 0.4 MG SL TABLET    DISSOLVE 1 TABLET UNDER THE TONGUE EVERY 5 MINUTES AS NEEDED FOR CHEST PAIN   OLOPATADINE (PATADAY) 0.1 % OPHTHALMIC SOLUTION    Place 1 drop into both eyes daily.   PROBIOTIC PRODUCT (ALIGN) 10 MG CAPS    Take 1 tablet by mouth daily.   ZOLEDRONIC  ACID (RECLAST  IV)    as directed. Once yearly, last infusion was November 2024  Modified Medications   No medications on file  Discontinued Medications   No medications on file    Physical Exam:  Vitals:   10/04/23 1116  BP: 138/76  Pulse: 73  Temp: 97.6 F (36.4 C)  SpO2: 98%  Weight: 117 lb 12.8 oz (53.4 kg)  Height: 5' 0.09 (1.526 m)   Body mass index is 22.94 kg/m. Wt Readings from Last 3 Encounters:  10/04/23 117 lb 12.8 oz (53.4 kg)  09/27/23 118 lb 6.4 oz (53.7 kg)  07/31/23 118 lb (53.5 kg)     Physical Exam Constitutional:      General: She is not in acute distress.    Appearance: She is well-developed. She is not diaphoretic.  HENT:     Head: Normocephalic and atraumatic.     Mouth/Throat:     Pharynx: No oropharyngeal exudate.  Eyes:     Conjunctiva/sclera: Conjunctivae normal.     Pupils: Pupils are equal, round, and reactive to light.  Cardiovascular:     Rate and Rhythm: Normal rate and regular rhythm.     Heart sounds: Normal heart sounds.  Pulmonary:     Effort: Pulmonary effort is normal.     Breath sounds: Normal breath sounds.  Abdominal:     General: Bowel sounds are normal.     Palpations: Abdomen is soft.  Musculoskeletal:     Cervical back: Normal range of motion and neck supple.     Right lower leg: No edema.     Left lower leg: No edema.  Skin:    General: Skin is warm and dry.  Neurological:     Mental Status: She is alert.  Psychiatric:        Mood and Affect: Mood normal.     Labs reviewed: Basic Metabolic Panel: Recent Labs    05/11/23 1522 09/27/23 1413  NA 141 138  K 4.3 4.0  CL 103 103  CO2 23 25  GLUCOSE 59* 148*  BUN 20 21  CREATININE 0.87 1.03*  CALCIUM 9.8 9.4  TSH  --  2.39   Liver Function Tests: Recent Labs    09/27/23 1413  AST 34  ALT 16  BILITOT 0.5  PROT 7.0   No results for input(s): LIPASE, AMYLASE in the last 8760 hours. No results for input(s): AMMONIA in the last 8760 hours. CBC: Recent Labs    05/11/23 1522 09/27/23 1413  WBC 7.3 6.4  NEUTROABS 3.7  --   HGB 11.3 10.9*  HCT 34.9 34.8*  MCV 95 96.4  PLT 240 325   Lipid Panel: Recent Labs    02/28/23 1102 05/01/23 1155  CHOL 156 144  HDL 71 74  LDLCALC 69 54  TRIG 88 87  CHOLHDL 2.2 1.9   TSH: Recent Labs    09/27/23 1413  TSH 2.39   A1C: Lab Results  Component Value Date   HGBA1C 5.5 11/14/2019     Assessment/Plan Assessment and Plan Assessment & Plan Hypertension Hypertension well-controlled. Fatigue  resolved after discontinuing metoprolol .  - Prescribe lisinopril  5 mg daily via [facility].  Anemia Hemoglobin 10.9, stable iron studies. Continuing iron supplementation for iron deficiency anemia. - Continue iron supplementation.    Meir [facility] [phone]

## 2023-10-06 ENCOUNTER — Ambulatory Visit (INDEPENDENT_AMBULATORY_CARE_PROVIDER_SITE_OTHER): Admitting: Sports Medicine

## 2023-10-06 ENCOUNTER — Encounter: Payer: Self-pay | Admitting: Sports Medicine

## 2023-10-06 DIAGNOSIS — M25561 Pain in right knee: Secondary | ICD-10-CM

## 2023-10-06 DIAGNOSIS — M21062 Valgus deformity, not elsewhere classified, left knee: Secondary | ICD-10-CM

## 2023-10-06 DIAGNOSIS — G5603 Carpal tunnel syndrome, bilateral upper limbs: Secondary | ICD-10-CM | POA: Diagnosis not present

## 2023-10-06 DIAGNOSIS — M25562 Pain in left knee: Secondary | ICD-10-CM

## 2023-10-06 DIAGNOSIS — G8929 Other chronic pain: Secondary | ICD-10-CM

## 2023-10-06 DIAGNOSIS — M170 Bilateral primary osteoarthritis of knee: Secondary | ICD-10-CM

## 2023-10-06 DIAGNOSIS — M21061 Valgus deformity, not elsewhere classified, right knee: Secondary | ICD-10-CM | POA: Diagnosis not present

## 2023-10-06 MED ORDER — BUPIVACAINE HCL 0.25 % IJ SOLN
2.0000 mL | INTRAMUSCULAR | Status: AC | PRN
  Administered 2023-10-06: 2 mL via INTRA_ARTICULAR

## 2023-10-06 MED ORDER — HYALURONAN 88 MG/4ML IX SOSY
88.0000 mg | PREFILLED_SYRINGE | INTRA_ARTICULAR | Status: AC | PRN
  Administered 2023-10-06: 88 mg via INTRA_ARTICULAR

## 2023-10-06 MED ORDER — LIDOCAINE HCL 1 % IJ SOLN
2.0000 mL | INTRAMUSCULAR | Status: AC | PRN
Start: 2023-10-06 — End: 2023-10-06
  Administered 2023-10-06: 2 mL

## 2023-10-06 NOTE — Progress Notes (Signed)
 Jody [name] - 83 y.o. female MRN [id number]  Date of birth: 1940-08-20  Office Visit Note: Visit Date: 10/06/2023 PCP: Caro Harlene POUR, NP Referred by: Caro Harlene POUR, NP  Subjective: Chief Complaint  Patient presents with   Right Knee - Pain   Left Knee - Pain   HPI: Jody [name] is a pleasant 83 y.o. 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  Bilateral knees -both knees have advanced arthritic change.  She does feel more supported in this knee unloader brace, wears this when she is out walking and active.  She would like to move forward with viscosupplementation injection today.  She is using the original Celebrex  100 mg daily for this and arthritic pains.  Left wrist/CTS -she does have known carpal tunnel of the left wrist.  Back in March of this year we did proceed with ultrasound-guided carpal tunnel injection with median nerve Hydro dissection.  This gave her excellent relief of her symptoms.  Here recently her numbness and tingling symptoms have somewhat returned although certainly not as bad as in the past.  She does have cock up wrist braces to use as needed.  Gabapentin  300 mg 3 times daily  Pertinent ROS were reviewed with the patient and found to be negative unless otherwise specified above in HPI.   Assessment & Plan: Visit Diagnoses:  1. Bilateral primary osteoarthritis of knee   2. Acquired genu valgum, bilateral   3. Carpal tunnel syndrome, bilateral   4. Bilateral chronic knee pain    Plan: Impression is chronic bilateral knee pain with advanced osteoarthritis of each knee.  She has found improvement from unloader brace, she will continue to wear this for support and genu valgum correction when she is walking and active.  We did move forward with both right and left knee viscosupplementation (Hyaluron) today.  Would like her to increase her Celebrex  100 mg to twice daily for the next 3  days, may also use ice or heat for postinjection protocol.  Starting on Monday, she will return to her normal Celebrex  100 mg daily dosing.  She does have bilateral carpal tunnel syndrome, her left is certainly the most significant, she had excellent relief of pain from previous carpal tunnel injection back in March.  I will see her back in 1 month and if she is having persistent symptoms, we can consider ultrasound-guided carpal tunnel injection at that time.  In the interim she may use gabapentin  300 mg 3 times daily.  Follow-up in 1 month.  Follow-up: Return in about 1 month (around 11/06/2023) for b/l knees, CTS f/u - consider US -Left carpal tunnel injection  Meds & Orders: No orders of the defined types were placed in this encounter.   Orders Placed This Encounter  Procedures   Large Joint Inj: R knee   Large Joint Inj: L knee     Procedures: Large Joint Inj: R knee on 10/06/2023 1:55 PM Indications: pain Details: 22 G 1.5 in needle, anteromedial approach Medications: 2 mL lidocaine  1 %; 2 mL bupivacaine  0.25 %; 88 mg Hyaluronan 88 MG/4ML Outcome: tolerated well, no immediate complications Procedure, treatment alternatives, risks and benefits explained, specific risks discussed. Consent was given by the patient. Patient was prepped and draped in the usual sterile fashion.    Large Joint Inj: L knee on 10/06/2023 1:56 PM Indications: pain Details: 22 G 1.5 in needle, anteromedial approach Medications: 2 mL lidocaine  1 %;  2 mL bupivacaine  0.25 %; 88 mg Hyaluronan 88 MG/4ML Outcome: tolerated well, no immediate complications Procedure, treatment alternatives, risks and benefits explained, specific risks discussed. Consent was given by the patient. Patient was prepped and draped in the usual sterile fashion.          Clinical History: No specialty comments available.  She reports that she quit smoking about 50 years ago. Her smoking use included cigarettes. She started smoking about  70 years ago. She has never used smokeless tobacco. No results for input(s): HGBA1C, LABURIC in the last 8760 hours.  Objective:    Physical Exam  Gen: Well-appearing, in no acute distress; non-toxic CV: Well-perfused. Warm.  Resp: Breathing unlabored on room air; no wheezing. Psych: Fluid speech in conversation; appropriate affect; normal thought process  Ortho Exam - Bilateral knees: There is no significant effusion of the knees.  There is full extension, but limited flexion bilaterally.  - Left wrist: + Tinel's over the dorsal wrist.  There is a persistent volar ganglion cyst just proximal to the carpal tunnel inlet.  Imaging: No results found.  Past Medical/Family/Surgical/Social History: Medications & Allergies reviewed per EMR, new medications updated. Patient Active Problem List   Diagnosis Date Noted   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   Neuropathy 07/31/2023   Pure hypercholesterolemia 11/14/2019   Gastroesophageal reflux disease 02/04/2019   Lumbar back pain with radiculopathy affecting left lower extremity 04/11/2016   Cervical spondylosis without myelopathy 06/08/2015   Absolute anemia 05/16/2014   Primary osteoarthritis involving multiple joints 05/16/2014   Essential hypertension 09/03/2012   Senile osteoporosis    B12 deficiency due to diet    Past Medical History:  Diagnosis Date   Anemia, unspecified    on meds   B12 deficiency due to diet    Basal cell carcinoma    LEFT UPPER ARM SUP. TX=CX3 5FU   Cataract    bilateral sx   Cramp of limb    Edema of both legs    Encounter for long-term (current) use of other medications    Hypertension    on meds   Lumbago    Melanoma (HCC) 10/18/2010   RIGHT POST SHOULDER =MOHS   Obesity    Osteoarthrosis, unspecified whether generalized or localized, unspecified site    generalized   Restless legs syndrome (RLS)    SCC (squamous cell carcinoma) 07/03/2017   RIGHT  CHEST CX3 5FU   SCC (squamous cell carcinoma) 01/28/2019   LEFT FOREARM CX3 5FU   SCC (squamous cell carcinoma) [id number]   RIGHT FOREHEAD CX3 5FU   SCC (squamous cell carcinoma) 07/03/2017   RIGHT FOREARM CX3 5FU   SCCA (squamous cell carcinoma) of skin 04/08/2020   Right Anterior Mandible (in situ)(CX35FU)   SCCA (squamous cell carcinoma) of skin 04/08/2020   Left Supraorbital Region (Keratoacanthoma)   Senile osteoporosis    Squamous cell carcinoma of skin 01/30/2017   RIGHT JAWLINE TX WITH BX   Family History  Problem Relation Age of Onset   Diabetes Father    Prostate cancer Father    Rectal cancer Sister 93   Colon polyps Sister    Cancer Sister    Diabetes Brother    Prostate cancer Brother    Lung cancer Brother 49   Other Brother        declined after a fall   Stomach cancer Maternal Grandmother    Pancreatic cancer Neg Hx    Colon cancer Neg Hx  Esophageal cancer Neg Hx    Past Surgical History:  Procedure Laterality Date   CATARACT EXTRACTION W/ INTRAOCULAR LENS  IMPLANT, BILATERAL  04/2014   COLONOSCOPY     EYE SURGERY Right 04/28/2016   Macular   LEFT HEART CATH AND CORONARY ANGIOGRAPHY N/A 05/15/2023   Procedure: LEFT HEART CATH AND CORONARY ANGIOGRAPHY;  Surgeon: Ladona Heinz, MD;  Location: [facility];  Service: Cardiovascular;  Laterality: N/A;   SPINE SURGERY  2005   STOMACH SURGERY  1981   stapleing    TONSILLECTOMY     UPPER GASTROINTESTINAL ENDOSCOPY     VITRECTOMY Right    with membrane peeling for a macular pucker   Social History   Occupational History   Occupation: Retired Nurse, mental health: [employer]  Tobacco Use   Smoking status: Former    Current packs/day: 0.00    Types: Cigarettes    Start date: 1955    Quit date: 1975    Years since quitting: 50.5   Smokeless tobacco: Never   Tobacco comments:    Quit in early 30's  Vaping Use   Vaping status: Never Used  Substance and Sexual Activity   Alcohol  use: No    Alcohol/week: 0.0 standard drinks of alcohol   Drug use: No   Sexual activity: Never

## 2023-10-06 NOTE — Progress Notes (Signed)
 Patient says that her knee has done well since her injection. She does wear her brace daily which has been helpful for her stability. She notices that she feels much less stable when she does not wear the brace. She is here today for gel injections. She has a trip to [place]  on 10/14/2023, and then will go on another trip for two months in August.

## 2023-10-08 ENCOUNTER — Encounter: Payer: Self-pay | Admitting: Nurse Practitioner

## 2023-11-03 ENCOUNTER — Ambulatory Visit: Admitting: Sports Medicine

## 2023-11-03 ENCOUNTER — Other Ambulatory Visit: Payer: Self-pay

## 2023-11-03 ENCOUNTER — Encounter: Payer: Self-pay | Admitting: Sports Medicine

## 2023-11-03 DIAGNOSIS — M21062 Valgus deformity, not elsewhere classified, left knee: Secondary | ICD-10-CM

## 2023-11-03 DIAGNOSIS — G5601 Carpal tunnel syndrome, right upper limb: Secondary | ICD-10-CM | POA: Diagnosis not present

## 2023-11-03 DIAGNOSIS — M79642 Pain in left hand: Secondary | ICD-10-CM

## 2023-11-03 DIAGNOSIS — M1712 Unilateral primary osteoarthritis, left knee: Secondary | ICD-10-CM

## 2023-11-03 DIAGNOSIS — G5602 Carpal tunnel syndrome, left upper limb: Secondary | ICD-10-CM

## 2023-11-03 DIAGNOSIS — M21061 Valgus deformity, not elsewhere classified, right knee: Secondary | ICD-10-CM

## 2023-11-03 NOTE — Progress Notes (Signed)
 Jody [name] - 83 y.o. female MRN [id number]  Date of birth: [birth year]  Office Visit Note: Visit Date: 11/03/2023 PCP: Caro Harlene POUR, NP Referred by: Caro Harlene POUR, NP  Subjective: Chief Complaint  Patient presents with   Left Wrist - Pain   HPI: Jody [name] is a pleasant 83 y.o. female who presents today for L > R carpal tunnel syndrome, also checking unloader brace on left knee.  Left CTS -back in March of this past year we did perform ultrasound-guided carpal tunnel injection with hydrodissection which gave her significant relief of her symptoms, it is currently still feeling better than in the past but her symptoms are starting to return.  She does have carpal tunnel syndrome on the right side but this is much more manageable.  She does use gabapentin  300 mg a.m., 600 mg nightly.  Left knee -she has found times where her unloader brace helps when she is up and walking.  She does feel more stability in the knee.  She is wearing today and would like to ensure it is appropriately fashioned.  Continues with original Celebrex  100 mg daily.  Pertinent ROS were reviewed with the patient and found to be negative unless otherwise specified above in HPI.   Assessment & Plan: Visit Diagnoses:  1. Carpal tunnel syndrome, left upper limb   2. Pain in left hand   3. Carpal tunnel syndrome, right upper limb   4. Acquired genu valgum, bilateral   5. Unilateral primary osteoarthritis, left knee    Plan: Impression is active exacerbation of chronic left-sided carpal tunnel syndrome, she does have right sided CTS but this is much more manageable.  Through shared decision making, did proceed with ultrasound-guided left carpal tunnel injection with median nerve Hydro dissection, patient tolerated well. Advised on postinjection protocol.  She may wear her cock-up wrist braces at nighttime, also continue gabapentin  300 mg a.m., 600 mg nightly for her CTS and peripheral polyneuropathy.  In  terms of her left knee, she has partial correction of her genu valgum with her lateral unloader brace, she will continue this.  We did ensure she was properly fitted today which she is wearing correctly.  For her overall joint pains, she may continue her Celebrex  100 mg daily.  She did mention evaluating her neck at some point as she does have notable osteoarthritis.  She is leaving for the beach but will return in a few months, she will contact me for appointment at that time if desires.  Follow-up: Return for make appt for neck when returns from beach.   Meds & Orders: No orders of the defined types were placed in this encounter.   Orders Placed This Encounter  Procedures   US  Guided Needle Placement - No Linked Charges     Procedures: Procedure: US -guided Carpal Tunnel Injection, Left Wrist After informed verbal consent and discussion on R/B/I, a timeout was performed, patient was seated on exam table with the affected arm placed in supine position on table. The area overlying the patient's carpal tunnel was prepped with Chloraprep and alcohol swabs then utilizing ultrasound guidance in a short-axis position via an in-plane approach, the patient's carpal tunnel was injected with a mixture of 1:1:1:1:1 lidocaine :bupivicaine:D50W:NS:betamethasone  with hydrodissection of the median nerve from the flexor retinaculum in 360 degree fashion. Visualization of the needle was achieved with a transverse, in-plane approach. Patient tolerated procedure well without immediate complications.   *Needle guidance was necessary for proper delivery of injectate and  avoidance of structures in the region of injection.      Clinical History: No specialty comments available.  She reports that she quit smoking about 50 years ago. Her smoking use included cigarettes. She started smoking about 70 years ago. She has never used smokeless tobacco. No results for input(s): HGBA1C, LABURIC in the last 8760  hours.  Objective:    Physical Exam  Gen: Well-appearing, in no acute distress; non-toxic CV: Well-perfused. Warm.  Resp: Breathing unlabored on room air; no wheezing. Psych: Fluid speech in conversation; appropriate affect; normal thought process  Ortho Exam - Left wrist: There is a volar sided ganglion cyst just proximal to the carpal tunnel inlet.  Positive Tinel's on the left.  No effusion of the wrist joint.  - Left knee: Both knees do fall into a slightly valgus fashion, the knee brace is in place and does help prevent this with stability.  No effusion of the knee.  Imaging: No results found.  Past Medical/Family/Surgical/Social History: Medications & Allergies reviewed per EMR, new medications updated. Patient Active Problem List   Diagnosis Date Noted   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   Neuropathy 07/31/2023   Pure hypercholesterolemia 11/14/2019   Gastroesophageal reflux disease 02/04/2019   Lumbar back pain with radiculopathy affecting left lower extremity 04/11/2016   Cervical spondylosis without myelopathy 06/08/2015   Absolute anemia 05/16/2014   Primary osteoarthritis involving multiple joints 05/16/2014   Essential hypertension 09/03/2012   Senile osteoporosis    B12 deficiency due to diet    Past Medical History:  Diagnosis Date   Anemia, unspecified    on meds   B12 deficiency due to diet    Basal cell carcinoma    LEFT UPPER ARM SUP. TX=CX3 5FU   Cataract    bilateral sx   Cramp of limb    Edema of both legs    Encounter for long-term (current) use of other medications    Hypertension    on meds   Lumbago    Melanoma (HCC) 10/18/2010   RIGHT POST SHOULDER =MOHS   Obesity    Osteoarthrosis, unspecified whether generalized or localized, unspecified site    generalized   Restless legs syndrome (RLS)    SCC (squamous cell carcinoma) 07/03/2017   RIGHT CHEST CX3 5FU   SCC (squamous cell carcinoma) 01/28/2019    LEFT FOREARM CX3 5FU   SCC (squamous cell carcinoma) [id number]   RIGHT FOREHEAD CX3 5FU   SCC (squamous cell carcinoma) 07/03/2017   RIGHT FOREARM CX3 5FU   SCCA (squamous cell carcinoma) of skin 04/08/2020   Right Anterior Mandible (in situ)(CX35FU)   SCCA (squamous cell carcinoma) of skin 04/08/2020   Left Supraorbital Region (Keratoacanthoma)   Senile osteoporosis    Squamous cell carcinoma of skin 01/30/2017   RIGHT JAWLINE TX WITH BX   Family History  Problem Relation Age of Onset   Diabetes Father    Prostate cancer Father    Rectal cancer Sister 1   Colon polyps Sister    Cancer Sister    Diabetes Brother    Prostate cancer Brother    Lung cancer Brother 56   Other Brother        declined after a fall   Stomach cancer Maternal Grandmother    Pancreatic cancer Neg Hx    Colon cancer Neg Hx    Esophageal cancer Neg Hx    Past Surgical History:  Procedure Laterality Date   CATARACT EXTRACTION W/  INTRAOCULAR LENS  IMPLANT, BILATERAL  04/2014   COLONOSCOPY     EYE SURGERY Right 04/28/2016   Macular   LEFT HEART CATH AND CORONARY ANGIOGRAPHY N/A 05/15/2023   Procedure: LEFT HEART CATH AND CORONARY ANGIOGRAPHY;  Surgeon: Ladona Heinz, MD;  Location: [facility];  Service: Cardiovascular;  Laterality: N/A;   SPINE SURGERY  2005   STOMACH SURGERY  1981   stapleing    TONSILLECTOMY     UPPER GASTROINTESTINAL ENDOSCOPY     VITRECTOMY Right    with membrane peeling for a macular pucker   Social History   Occupational History   Occupation: Retired Nurse, mental health: [employer]  Tobacco Use   Smoking status: Former    Current packs/day: 0.00    Types: Cigarettes    Start date: 1955    Quit date: 1975    Years since quitting: 50.6   Smokeless tobacco: Never   Tobacco comments:    Quit in early 30's  Vaping Use   Vaping status: Never Used  Substance and Sexual Activity   Alcohol use: No    Alcohol/week: 0.0 standard drinks of alcohol    Drug use: No   Sexual activity: Never

## 2023-11-03 NOTE — Progress Notes (Signed)
 Patient says that the symptoms in the left wrist are coming back, and gradually began to return in mid June. She is going to the beach soon and will be there until November, so is here today for repeat injection prior to her trip.

## 2023-11-09 ENCOUNTER — Encounter: Payer: Self-pay | Admitting: Nurse Practitioner

## 2023-11-09 ENCOUNTER — Other Ambulatory Visit: Payer: Self-pay

## 2023-11-09 MED ORDER — LIDOCAINE 5 % EX PTCH: 1.0000 | MEDICATED_PATCH | Freq: Every day | CUTANEOUS | 3 refills | Status: AC | PRN

## 2023-11-15 LAB — HM MAMMOGRAPHY

## 2023-11-17 ENCOUNTER — Encounter: Payer: Self-pay | Admitting: Nurse Practitioner

## 2024-01-29 ENCOUNTER — Ambulatory Visit: Admitting: Sports Medicine

## 2024-01-29 ENCOUNTER — Encounter: Payer: Self-pay | Admitting: Sports Medicine

## 2024-01-29 ENCOUNTER — Encounter: Payer: Self-pay | Admitting: Radiology

## 2024-01-29 DIAGNOSIS — M19011 Primary osteoarthritis, right shoulder: Secondary | ICD-10-CM

## 2024-01-29 DIAGNOSIS — M170 Bilateral primary osteoarthritis of knee: Secondary | ICD-10-CM | POA: Diagnosis not present

## 2024-01-29 DIAGNOSIS — M19012 Primary osteoarthritis, left shoulder: Secondary | ICD-10-CM

## 2024-01-29 DIAGNOSIS — G5601 Carpal tunnel syndrome, right upper limb: Secondary | ICD-10-CM

## 2024-01-29 DIAGNOSIS — R29898 Other symptoms and signs involving the musculoskeletal system: Secondary | ICD-10-CM

## 2024-01-29 DIAGNOSIS — M12811 Other specific arthropathies, not elsewhere classified, right shoulder: Secondary | ICD-10-CM

## 2024-01-29 DIAGNOSIS — M25512 Pain in left shoulder: Secondary | ICD-10-CM

## 2024-01-29 DIAGNOSIS — G8929 Other chronic pain: Secondary | ICD-10-CM

## 2024-01-29 DIAGNOSIS — M25511 Pain in right shoulder: Secondary | ICD-10-CM

## 2024-01-29 MED ORDER — PREDNISONE 20 MG PO TABS: 20.0000 mg | ORAL_TABLET | Freq: Every day | ORAL | 0 refills | Status: DC

## 2024-01-29 NOTE — Progress Notes (Signed)
 Patient has been at the beach for the last 2 months. She says that she has been having pain down the back of the left leg from the glute to the foot. She has been experiencing thigh pain, which she says she has not experienced before, as well as knee pain and new instability in the knee. She has recently struggled more to walk, and has had to use her arms to assist with lifting the legs from the hips, such as getting into the car.   Patient says that she did not get any relief from the most recent carpal tunnel injection. She began having more pain in the wrist/hand when she returned from the beach, but that pain did get especially bad and prevented her from sleeping last night. Her pain is much worse on the left side, although it is returning on the right.  Patient has had pain in the right shoulder, as well. She is able to raise and use the left arm, but cannot actively raise her right arm overhead. She can move the arm overhead passively, although this motion is still painful.

## 2024-01-29 NOTE — Progress Notes (Signed)
 Jody [name] - 83 y.o. female MRN [id number]  Date of birth: 11-04-1940  Office Visit Note: Visit Date: 01/29/2024 PCP: Caro Harlene POUR, NP Referred by: Caro Harlene POUR, NP  Subjective: Chief Complaint  Patient presents with   Left Leg - Pain   HPI: Jody [name] is a pleasant 83 y.o. female who presents today for multiple joint pains.  Left hip/leg -[name] has been down at the beach for the last 2 months and has been dealing with walking daily.  She does feel like she has had pain over the back part of the glutes.  This will go down the leg and the hamstrings and at times into the foot as well.  She denies any radicular symptoms of numbness or tingling however.  She has noticed the legs been more weak if she is having to use her arms more with lifting and transferring such as getting in and out of the car or up from a seat.  She feels an achy pain within the thighs.  Bilateral knees -the knees do seem somewhat flared up at this point as she has been walking a lot at the beach.  She continues going to the pool daily as well as an aerobic class for 45 minutes once weekly.  She does wear her unloader brace on the left knee which does help with stability.  Terms of medication, she is using her Celebrex  100 mg once daily.  Shoulders -right shoulder has given her issues in the past, she does appreciate some swelling on the side.  She does use her left shoulder more given her chronic right shoulder pain and with certain reaching such as getting objects out of the cupboard she has more pain.  CTS -has done well from previous carpal tunnel injections in the past although the most recent was not very helpful.  She is managed on gabapentin  300 mg a.m. as well as 600 mg in the evening.  She is wearing her cock up wrist brace at nighttime.  Pertinent ROS were reviewed with the patient and found to be negative unless otherwise specified above in HPI.   Assessment & Plan: Visit Diagnoses:  1.  Bilateral primary osteoarthritis of knee   2. Carpal tunnel syndrome, right upper limb   3. Chronic pain of both shoulders   4. Primary osteoarthritis of shoulders, bilateral   5. Rotator cuff arthropathy of both shoulders   6. Leg weakness, bilateral    Plan: Impression is multiple joint arthralgias as above.  She has been at the beach the last 2 months and continues being active walking this as well as in the pool daily.  I do feel she is in general flare with her inflammatory burden.  Given this we will start her on a low-dose of prednisone  20 mg x 1 week only.  She then may continue her Celebrex  100 mg daily.  I would like to see how much this calms down her overall body pain.  She does have a recurrent effusion of the right shoulder given her severe arthritis and rotator cuff arthropathy, the left shoulder has started to bother her more now given compensation.  We will get her started in physical therapy for this.  I also think she would benefit greatly from physical therapy to work on quadricep strengthening, balancing and stability given this has been more difficult for her for transferring, getting up and down from a seat in and out of a car.  She will continue  her unloader brace for the left knee.  Could consider knee injection if not improved going forward.  She will continue her cock up wrist brace, we did fit her again for this today for her carpal tunnel.  Continue her gabapentin  300 mg a.m., 600 mg p.m.  We will see her back over the next 1-2 weeks to see how she responds to the above and treat accordingly.  Additional considerations: R-shoulder asp/inj  Meds & Orders:  Meds ordered this encounter  Medications   predniSONE  (DELTASONE ) 20 MG tablet    Sig: Take 1 tablet (20 mg total) by mouth daily with breakfast for 7 days.    Dispense:  7 tablet    Refill:  0    Orders Placed This Encounter  Procedures   Ambulatory referral to Physical Therapy     Procedures: No procedures  performed      Clinical History: No specialty comments available.  She reports that she quit smoking about 50 years ago. Her smoking use included cigarettes. She started smoking about 70 years ago. She has never used smokeless tobacco. No results for input(s): HGBA1C, LABURIC in the last 8760 hours.  Objective:    Physical Exam  Gen: Well-appearing, in no acute distress; non-toxic CV: Well-perfused. Warm.  Resp: Breathing unlabored on room air; no wheezing. Psych: Fluid speech in conversation; appropriate affect; normal thought process  Ortho Exam - Bilateral shoulders: The right shoulder has at least a moderate effusion.  There is pain and restriction with active range of motion bilaterally above 90 degrees, unable to take her further passively although mild crepitus at endrange plane.  The left shoulder does not have an effusion present.  - Bilateral knees: No significant redness swelling or effusion of either knee.  Brace is in place for unloader on the left knee.  Mild patellofemoral crepitus.  Range of motion from 0-125 degrees.  Knees do fall into a slightly valgus fashion.  There is some generalized weakness of bilateral quadricep muscles.  There is weakness with going from sitting to standing, does use postop of both arms.  Imaging:  08/10/23: 4 views of bilateral knees including standing AP, Rosenberg, lateral and  sunrise views were ordered and reviewed by myself today.  X-rays  demonstrate severe bone-on-bone collapse of the lateral tibiofemoral joint  space.  There is notable subchondral sclerosis of the lateral femoral  condyle and lateral tibial plateau.  The knee is fall into a valgus  fashion bilaterally.  Lateral view of the left knee shows healed  fragmentation of the patella likely from prior fracture.  No acute  fracture or acute bony abnormality otherwise noted.   Narrative & Impression  CLINICAL DATA:  Chronic right shoulder pain   EXAM: MRI OF THE RIGHT  SHOULDER WITHOUT CONTRAST   TECHNIQUE: Multiplanar, multisequence MR imaging of the shoulder was performed. No intravenous contrast was administered.   COMPARISON:  None Available.   FINDINGS: Rotator cuff: Complete full-thickness, full width tear of the supraspinatus tendon with 3.4 cm of retraction moderate infraspinatus tendinosis with a full-thickness tear of the anterior half of the tendon. Teres minor tendon is intact. Moderate subscapularis tendinosis.   Muscles: Severe atrophy of the subscapularis muscle. Remainder the rotator cuff muscles demonstrate no focal atrophy.   Biceps Long Head: Complete tear of the long head of the biceps tendon.   Acromioclavicular Joint: Moderate arthropathy of the acromioclavicular joint. Large amount of subacromial/subdeltoid bursal fluid.   Glenohumeral Joint: Large glenohumeral joint effusion with  synovitis. Extensive full-thickness cartilage loss of the glenohumeral joint and subchondral marrow edema in the glenoid. Remodeling of the anterior inferior glenoid with overall loss of bone stock. No chondral defect.   Labrum: Diffuse labral degeneration.   Bones: No fracture or dislocation. No aggressive osseous lesion.   Other: No fluid collection or hematoma.   IMPRESSION: 1. Complete full-thickness, full width tear of the supraspinatus tendon with 3.4 cm of retraction. 2. Moderate infraspinatus tendinosis with a full-thickness tear of the anterior half of the tendon. 3. Moderate subscapularis tendinosis. Severe atrophy of the subscapularis muscle. 4. Complete tear of the long head of the biceps tendon. 5. Severe osteoarthritis of the right glenohumeral joint.     Electronically Signed   By: Julaine Blanch M.D.   On: 03/11/2023 08:44    Past Medical/Family/Surgical/Social History: Medications & Allergies reviewed per EMR, new medications updated. Patient Active Problem List   Diagnosis Date Noted   Irritable bowel syndrome  with both constipation and diarrhea 07/31/2023   Neuropathy 07/31/2023   Pure hypercholesterolemia 11/14/2019   Gastroesophageal reflux disease 02/04/2019   Lumbar back pain with radiculopathy affecting left lower extremity 04/11/2016   Cervical spondylosis without myelopathy 06/08/2015   Absolute anemia 05/16/2014   Primary osteoarthritis involving multiple joints 05/16/2014   Essential hypertension 09/03/2012   Senile osteoporosis    B12 deficiency due to diet    Past Medical History:  Diagnosis Date   Anemia, unspecified    on meds   B12 deficiency due to diet    Basal cell carcinoma    LEFT UPPER ARM SUP. TX=CX3 5FU   Cataract    bilateral sx   Cramp of limb    Edema of both legs    Encounter for long-term (current) use of other medications    Hypertension    on meds   Lumbago    Melanoma (HCC) 10/18/2010   RIGHT POST SHOULDER =MOHS   Obesity    Osteoarthrosis, unspecified whether generalized or localized, unspecified site    generalized   Restless legs syndrome (RLS)    SCC (squamous cell carcinoma) 07/03/2017   RIGHT CHEST CX3 5FU   SCC (squamous cell carcinoma) 01/28/2019   LEFT FOREARM CX3 5FU   SCC (squamous cell carcinoma) [id number]   RIGHT FOREHEAD CX3 5FU   SCC (squamous cell carcinoma) 07/03/2017   RIGHT FOREARM CX3 5FU   SCCA (squamous cell carcinoma) of skin 04/08/2020   Right Anterior Mandible (in situ)(CX35FU)   SCCA (squamous cell carcinoma) of skin 04/08/2020   Left Supraorbital Region (Keratoacanthoma)   Senile osteoporosis    Squamous cell carcinoma of skin 01/30/2017   RIGHT JAWLINE TX WITH BX   Family History  Problem Relation Age of Onset   Diabetes Father    Prostate cancer Father    Rectal cancer Sister 58   Colon polyps Sister    Cancer Sister    Diabetes Brother    Prostate cancer Brother    Lung cancer Brother 88   Other Brother        declined after a fall   Stomach cancer Maternal Grandmother    Pancreatic cancer Neg Hx     Colon cancer Neg Hx    Esophageal cancer Neg Hx    Past Surgical History:  Procedure Laterality Date   CATARACT EXTRACTION W/ INTRAOCULAR LENS  IMPLANT, BILATERAL  04/2014   COLONOSCOPY     EYE SURGERY Right 04/28/2016   Macular   LEFT HEART CATH AND CORONARY  ANGIOGRAPHY N/A 05/15/2023   Procedure: LEFT HEART CATH AND CORONARY ANGIOGRAPHY;  Surgeon: Ladona Heinz, MD;  Location: [facility];  Service: Cardiovascular;  Laterality: N/A;   SPINE SURGERY  2005   STOMACH SURGERY  1981   stapleing    TONSILLECTOMY     UPPER GASTROINTESTINAL ENDOSCOPY     VITRECTOMY Right    with membrane peeling for a macular pucker   Social History   Occupational History   Occupation: Retired Nurse, Mental Health: [employer]  Tobacco Use   Smoking status: Former    Current packs/day: 0.00    Types: Cigarettes    Start date: 1955    Quit date: 1975    Years since quitting: 50.8   Smokeless tobacco: Never   Tobacco comments:    Quit in early 30's  Vaping Use   Vaping status: Never Used  Substance and Sexual Activity   Alcohol use: No    Alcohol/week: 0.0 standard drinks of alcohol   Drug use: No   Sexual activity: Never   I spent 42 minutes in the care of the patient today including face-to-face time, preparation to see the patient, as well as review of previous imaging including left and right knee x-ray, review of previous x-rays of the shoulder from December as well as MRI scan, discussion with over-the-counter and oral medication regimen, need for physical therapy versus HEP, and activity modification for the above diagnoses.   Lonell Sprang, DO Primary Care Sports Medicine Physician  [facility]  This note was dictated using Dragon naturally speaking software and may contain errors in syntax, spelling, or content which have not been identified prior to signing this note.

## 2024-02-02 ENCOUNTER — Ambulatory Visit (INDEPENDENT_AMBULATORY_CARE_PROVIDER_SITE_OTHER): Payer: Self-pay | Admitting: Nurse Practitioner

## 2024-02-02 ENCOUNTER — Encounter: Payer: Self-pay | Admitting: Nurse Practitioner

## 2024-02-02 ENCOUNTER — Telehealth (HOSPITAL_COMMUNITY): Payer: Self-pay | Admitting: *Deleted

## 2024-02-02 VITALS — BP 118/80 | HR 64 | Temp 98.0°F | Ht 60.08 in | Wt 119.8 lb

## 2024-02-02 DIAGNOSIS — M810 Age-related osteoporosis without current pathological fracture: Secondary | ICD-10-CM

## 2024-02-02 DIAGNOSIS — I10 Essential (primary) hypertension: Secondary | ICD-10-CM

## 2024-02-02 DIAGNOSIS — D509 Iron deficiency anemia, unspecified: Secondary | ICD-10-CM | POA: Diagnosis not present

## 2024-02-02 DIAGNOSIS — R1310 Dysphagia, unspecified: Secondary | ICD-10-CM

## 2024-02-02 DIAGNOSIS — G629 Polyneuropathy, unspecified: Secondary | ICD-10-CM

## 2024-02-02 DIAGNOSIS — M159 Polyosteoarthritis, unspecified: Secondary | ICD-10-CM

## 2024-02-02 DIAGNOSIS — N3945 Continuous leakage: Secondary | ICD-10-CM

## 2024-02-02 MED ORDER — CELECOXIB 100 MG PO CAPS: ORAL_CAPSULE | ORAL | 11 refills | Status: DC

## 2024-02-02 MED ORDER — CELECOXIB 100 MG PO CAPS: ORAL_CAPSULE | ORAL | 3 refills | Status: AC

## 2024-02-02 MED ORDER — CELECOXIB 100 MG PO CAPS
ORAL_CAPSULE | ORAL | 11 refills | Status: DC
Start: 2024-02-02 — End: 2024-02-02

## 2024-02-02 NOTE — Telephone Encounter (Signed)
 Attempted to contact patient to schedule OP MBS. Left VM @ [phone] requesting a call back. RKEEL

## 2024-02-02 NOTE — Progress Notes (Signed)
 Careteam: Patient Care Team: Caro Harlene POUR, NP as PCP - General (Geriatric Medicine) Ladona Heinz, MD as PCP - Cardiology (Cardiology) Cleotilde Sewer, OD as Consulting Physician (Optometry) Regal, Pasco RAMAN, DPM as Consulting Physician (Podiatry) Combs, Isaiah Bottcher, DO as Referring Physician (Geriatric Medicine) Burnetta Brunet, DO as Consulting Physician (Sports Medicine) Leigh Venetia CROME, MD as Consulting Physician (Neurology)  PLACE OF SERVICE:  [facility]  Advanced Directive information    Allergies  Allergen Reactions   Shrimp [Shellfish Allergy] Anaphylaxis    ALL SEAFOOD   Prolia  [Denosumab ] Other (See Comments)    Chest pain   Percocet [Oxycodone -Acetaminophen ]     Confusion     Chief Complaint  Patient presents with   Medical Management of Chronic Issues    6 month routine follow up Pt wants to discuss celecoxib  (CELEBREX ) 100 MG capsule medication.     HPI:  Discussed the use of AI scribe software for clinical note transcription with the patient, who gave verbal consent to proceed.  History of Present Illness Jody [name] is an 83 year old female with osteoporosis and chronic pain who presents for a six-month follow-up.  She experiences chronic pain managed with Celebrex , which she obtains from [place] via a [employer] air lines. Her prescription is for one tablet daily, with an additional dose if needed for pain. Celebrex  is crucial for her pain management, and without it, she experiences significant discomfort. She is currently on a seven-day low-dose prednisone  course due to a recent increase in pain levels.  She takes gabapentin  300 mg three times daily, though she typically takes one in the morning and two at night for neuropathy.   She also takes lisinopril  5 mg for blood pressure.  She experiences urinary incontinence, describing it as continuous leakage without warning, which is more challenging than before. She wears protection and finds it  inconvenient, especially during water therapy sessions.  She has a long-standing issue with throat swelling and a sensation of blockage when swallowing, particularly with meals. This has worsened over time, and she finds relief by lying on her left side. She underwent a comprehensive workup over two years ago, including a swallow test and endoscopy, which showed no abnormalities. However, her symptoms have intensified, with certain foods like lettuce and spaghetti causing a sensation of hanging in her throat.  She uses a knee brace due to muscle mass loss and instability in her knee, which she previously fractured. She relies on a cane for mobility and has been using it for over five years.  She reports bowel movements alternating between constipation and diarrhea, which she manages with Align every other day, resulting in more consistent bowel habits.   Review of Systems:  Review of Systems  Constitutional:  Negative for chills, fever and weight loss.  HENT:  Negative for tinnitus.   Respiratory:  Negative for cough, sputum production and shortness of breath.   Cardiovascular:  Negative for chest pain, palpitations and leg swelling.  Gastrointestinal:  Negative for abdominal pain, constipation, diarrhea and heartburn.  Genitourinary:  Negative for dysuria, frequency and urgency.  Musculoskeletal:  Positive for joint pain and myalgias. Negative for back pain and falls.  Skin: Negative.   Neurological:  Negative for dizziness and headaches.  Psychiatric/Behavioral:  Negative for depression and memory loss. The patient does not have insomnia.     Past Medical History:  Diagnosis Date   Anemia, unspecified    on meds   B12 deficiency due to diet  Basal cell carcinoma    LEFT UPPER ARM SUP. TX=CX3 5FU   Cataract    bilateral sx   Cramp of limb    Edema of both legs    Encounter for long-term (current) use of other medications    Hypertension    on meds   Lumbago    Melanoma (HCC)  10/18/2010   RIGHT POST SHOULDER =MOHS   Obesity    Osteoarthrosis, unspecified whether generalized or localized, unspecified site    generalized   Restless legs syndrome (RLS)    SCC (squamous cell carcinoma) 07/03/2017   RIGHT CHEST CX3 5FU   SCC (squamous cell carcinoma) 01/28/2019   LEFT FOREARM CX3 5FU   SCC (squamous cell carcinoma) [id number]   RIGHT FOREHEAD CX3 5FU   SCC (squamous cell carcinoma) 07/03/2017   RIGHT FOREARM CX3 5FU   SCCA (squamous cell carcinoma) of skin 04/08/2020   Right Anterior Mandible (in situ)(CX35FU)   SCCA (squamous cell carcinoma) of skin 04/08/2020   Left Supraorbital Region (Keratoacanthoma)   Senile osteoporosis    Squamous cell carcinoma of skin 01/30/2017   RIGHT JAWLINE TX WITH BX   Past Surgical History:  Procedure Laterality Date   CATARACT EXTRACTION W/ INTRAOCULAR LENS  IMPLANT, BILATERAL  04/2014   COLONOSCOPY     EYE SURGERY Right 04/28/2016   Macular   LEFT HEART CATH AND CORONARY ANGIOGRAPHY N/A 05/15/2023   Procedure: LEFT HEART CATH AND CORONARY ANGIOGRAPHY;  Surgeon: Ladona Heinz, MD;  Location: [facility];  Service: Cardiovascular;  Laterality: N/A;   SPINE SURGERY  2005   STOMACH SURGERY  1981   stapleing    TONSILLECTOMY     UPPER GASTROINTESTINAL ENDOSCOPY     VITRECTOMY Right    with membrane peeling for a macular pucker   Social History:   reports that she quit smoking about 50 years ago. Her smoking use included cigarettes. She started smoking about 70 years ago. She has never used smokeless tobacco. She reports that she does not drink alcohol and does not use drugs.  Family History  Problem Relation Age of Onset   Diabetes Father    Prostate cancer Father    Rectal cancer Sister 60   Colon polyps Sister    Cancer Sister    Diabetes Brother    Prostate cancer Brother    Lung cancer Brother 43   Other Brother        declined after a fall   Stomach cancer Maternal Grandmother    Pancreatic cancer Neg  Hx    Colon cancer Neg Hx    Esophageal cancer Neg Hx     Medications: Patient's Medications  New Prescriptions   No medications on file  Previous Medications   CALCIUM CARBONATE-VIT D-MIN (CALCIUM 1200 PO)    Take 1 tablet by mouth in the morning.   CELECOXIB  (CELEBREX ) 100 MG CAPSULE    1 tablet by mouth daily, will take additional tablet if needed   DICLOFENAC SODIUM (VOLTAREN ARTHRITIS PAIN) 1 % GEL    Apply 1 Application topically 4 (four) times daily as needed (pain.).   GABAPENTIN  (NEURONTIN ) 300 MG CAPSULE    Take 1 capsule (300 mg total) by mouth 3 (three) times daily.   IRON 66 MG TABS    Take 66 mg by mouth in the morning.   LIDOCAINE  (LIDODERM ) 5 %    Place 1 patch onto the skin daily as needed (pain.). Remove & Discard patch within 12 hours  or as directed by MD   LISINOPRIL  (ZESTRIL ) 5 MG TABLET    Take 1 tablet (5 mg total) by mouth daily.   MULTIPLE VITAMIN (MULTIVITAMIN WITH MINERALS) TABS TABLET    Take 1 tablet by mouth in the morning.   NITROGLYCERIN  (NITROSTAT ) 0.4 MG SL TABLET    DISSOLVE 1 TABLET UNDER THE TONGUE EVERY 5 MINUTES AS NEEDED FOR CHEST PAIN   OLOPATADINE (PATADAY) 0.1 % OPHTHALMIC SOLUTION    Place 1 drop into both eyes daily.   PREDNISONE  (DELTASONE ) 20 MG TABLET    Take 1 tablet (20 mg total) by mouth daily with breakfast for 7 days.   PROBIOTIC PRODUCT (ALIGN) 10 MG CAPS    Take 1 tablet by mouth daily.   ZOLEDRONIC  ACID (RECLAST  IV)    as directed. Once yearly, last infusion was November 2024  Modified Medications   No medications on file  Discontinued Medications   METOPROLOL  SUCCINATE (TOPROL -XL) 25 MG 24 HR TABLET    TAKE 1 TABLET(25 MG) BY MOUTH DAILY    Physical Exam:  Vitals:   02/02/24 0941  BP: 118/80  Pulse: 64  Temp: 98 F (36.7 C)  SpO2: 97%  Weight: 119 lb 12.8 oz (54.3 kg)  Height: 5' 0.08 (1.526 m)   Body mass index is 23.34 kg/m. Wt Readings from Last 3 Encounters:  02/02/24 119 lb 12.8 oz (54.3 kg)  10/04/23 117 lb  12.8 oz (53.4 kg)  09/27/23 118 lb 6.4 oz (53.7 kg)    Physical Exam Constitutional:      General: She is not in acute distress.    Appearance: She is well-developed. She is not diaphoretic.  HENT:     Head: Normocephalic and atraumatic.     Mouth/Throat:     Pharynx: No oropharyngeal exudate.  Eyes:     Conjunctiva/sclera: Conjunctivae normal.     Pupils: Pupils are equal, round, and reactive to light.  Cardiovascular:     Rate and Rhythm: Normal rate and regular rhythm.     Heart sounds: Normal heart sounds.  Pulmonary:     Effort: Pulmonary effort is normal.     Breath sounds: Normal breath sounds.  Abdominal:     General: Bowel sounds are normal.     Palpations: Abdomen is soft.  Musculoskeletal:     Cervical back: Normal range of motion and neck supple.     Right lower leg: No edema.     Left lower leg: No edema.  Skin:    General: Skin is warm and dry.  Neurological:     Mental Status: She is alert.  Psychiatric:        Mood and Affect: Mood normal.     Labs reviewed: Basic Metabolic Panel: Recent Labs    05/11/23 1522 09/27/23 1413  NA 141 138  K 4.3 4.0  CL 103 103  CO2 23 25  GLUCOSE 59* 148*  BUN 20 21  CREATININE 0.87 1.03*  CALCIUM 9.8 9.4  TSH  --  2.39   Liver Function Tests: Recent Labs    09/27/23 1413  AST 34  ALT 16  BILITOT 0.5  PROT 7.0   No results for input(s): LIPASE, AMYLASE in the last 8760 hours. No results for input(s): AMMONIA in the last 8760 hours. CBC: Recent Labs    05/11/23 1522 09/27/23 1413  WBC 7.3 6.4  NEUTROABS 3.7  --   HGB 11.3 10.9*  HCT 34.9 34.8*  MCV 95 96.4  PLT 240  325   Lipid Panel: Recent Labs    02/28/23 1102 05/01/23 1155  CHOL 156 144  HDL 71 74  LDLCALC 69 54  TRIG 88 87  CHOLHDL 2.2 1.9   TSH: Recent Labs    09/27/23 1413  TSH 2.39   A1C: Lab Results  Component Value Date   HGBA1C 5.5 11/14/2019     Assessment/Plan  Assessment & Plan Age-related  osteoporosis Requiring Reclast  infusion. - Ordered blood work prior to Reclast  infusion. - Will schedule Reclast  infusion after blood work results are available.  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 Concerns about kidney effects of Celebrex , but necessary for pain control. Currently on a low-dose prednisone  for acute pain exacerbation, with caution advised due to increased bleeding risk when combined with Celebrex . Gabapentin  used for neuropathic pain. - Prescribed Celebrex  with caution regarding kidney effects. - Advised against concurrent use of high-dose prednisone  with Celebrex . - Continue gabapentin  300 mg three times a day.  Essential hypertension Well-controlled with lisinopril  5 mg daily. - Continue lisinopril  5 mg daily.  Iron deficiency anemia Previous good iron levels. Re-evaluation needed. - Ordered CBC to re-evaluate iron levels.  Polyneuropathy Managed with gabapentin . - Continue gabapentin  300 mg three times a day.  Urinary incontinence with continuous leakage Urinary incontinence with continuous leakage, no warning, and aggravating symptoms. No prior urology consultation. - Referred to urologist for evaluation and management.  Dysphagia Chronic dysphagia with sensation of blockage after meals, requiring positional relief. Previous GI evaluation two years ago was unremarkable. Symptoms have worsened. - Referred to GI specialist for re-evaluation. - Ordered repeat swallow study.  General Health Maintenance Received flu and COVID vaccinations.    Return in about 6 months (around 08/01/2024) for routine follow up.  Sadiya Durand K. Caro BODILY [facility] [phone]

## 2024-02-03 LAB — COMPREHENSIVE METABOLIC PANEL WITH GFR
AG Ratio: 1.4 (calc) (ref 1.0–2.5)
ALT: 19 U/L (ref 6–29)
AST: 36 U/L — ABNORMAL HIGH (ref 10–35)
Albumin: 4.3 g/dL (ref 3.6–5.1)
Alkaline phosphatase (APISO): 52 U/L (ref 37–153)
BUN: 21 mg/dL (ref 7–25)
CO2: 28 mmol/L (ref 20–32)
Calcium: 9.6 mg/dL (ref 8.6–10.4)
Chloride: 100 mmol/L (ref 98–110)
Creat: 0.92 mg/dL (ref 0.60–0.95)
Globulin: 3 g/dL (ref 1.9–3.7)
Glucose, Bld: 75 mg/dL (ref 65–99)
Potassium: 3.9 mmol/L (ref 3.5–5.3)
Sodium: 138 mmol/L (ref 135–146)
Total Bilirubin: 0.5 mg/dL (ref 0.2–1.2)
Total Protein: 7.3 g/dL (ref 6.1–8.1)
eGFR: 61 mL/min/1.73m2 (ref >=60)

## 2024-02-03 LAB — CBC WITH DIFFERENTIAL/PLATELET
Absolute Lymphocytes: 1621 {cells}/uL (ref 850–3900)
Absolute Monocytes: 460 {cells}/uL (ref 200–950)
Basophils Absolute: 29 {cells}/uL (ref 0–200)
Basophils Relative: 0.4 %
Eosinophils Absolute: 124 {cells}/uL (ref 15–500)
Eosinophils Relative: 1.7 %
HCT: 34.6 % — ABNORMAL LOW (ref 35.0–45.0)
Hemoglobin: 11.2 g/dL — ABNORMAL LOW (ref 11.7–15.5)
MCH: 30.4 pg (ref 27.0–33.0)
MCHC: 32.4 g/dL (ref 32.0–36.0)
MCV: 93.8 fL (ref 80.0–100.0)
MPV: 9.9 fL (ref 7.5–12.5)
Monocytes Relative: 6.3 %
Neutro Abs: 5066 {cells}/uL (ref 1500–7800)
Neutrophils Relative %: 69.4 %
Platelets: 339 Thousand/uL (ref 140–400)
RBC: 3.69 Million/uL — ABNORMAL LOW (ref 3.80–5.10)
RDW: 11.7 % (ref 11.0–15.0)
Total Lymphocyte: 22.2 %
WBC: 7.3 Thousand/uL (ref 3.8–10.8)

## 2024-02-03 LAB — IRON,TIBC AND FERRITIN PANEL
%SAT: 29 % (ref 16–45)
Ferritin: 219 ng/mL (ref 16–288)
Iron: 101 ug/dL (ref 45–160)
TIBC: 348 ug/dL (ref 250–450)

## 2024-02-04 ENCOUNTER — Ambulatory Visit: Payer: Self-pay | Admitting: Nurse Practitioner

## 2024-02-04 ENCOUNTER — Encounter: Payer: Self-pay | Admitting: Nurse Practitioner

## 2024-02-05 ENCOUNTER — Other Ambulatory Visit: Payer: Self-pay

## 2024-02-05 ENCOUNTER — Ambulatory Visit: Admitting: Sports Medicine

## 2024-02-05 ENCOUNTER — Encounter: Payer: Self-pay | Admitting: Sports Medicine

## 2024-02-05 DIAGNOSIS — M19012 Primary osteoarthritis, left shoulder: Secondary | ICD-10-CM | POA: Diagnosis not present

## 2024-02-05 DIAGNOSIS — M12811 Other specific arthropathies, not elsewhere classified, right shoulder: Secondary | ICD-10-CM

## 2024-02-05 DIAGNOSIS — M19011 Primary osteoarthritis, right shoulder: Secondary | ICD-10-CM | POA: Diagnosis not present

## 2024-02-05 DIAGNOSIS — G5601 Carpal tunnel syndrome, right upper limb: Secondary | ICD-10-CM | POA: Diagnosis not present

## 2024-02-05 DIAGNOSIS — G5602 Carpal tunnel syndrome, left upper limb: Secondary | ICD-10-CM | POA: Diagnosis not present

## 2024-02-05 NOTE — Progress Notes (Signed)
 SCOTT FIX - 83 y.o. female MRN [id number]  Date of birth: 12-12-1940  Office Visit Note: Visit Date: 02/05/2024 PCP: Caro Harlene POUR, NP Referred by: Caro Harlene POUR, NP  Subjective: Chief Complaint  Patient presents with   Left Wrist - Pain   HPI: Jody [name] is a pleasant 83 y.o. female who presents today for follow-up of b/l knees, shoulder, CTS f/u.  At last visit, Raymond had a flareup of her arthritis in multiple joints but this certainly improved quite a bit after the low-dose of prednisone  20 mg x 1 week.  She feels she is back to her baseline pain now.  Her shoulders are doing okay, though she does note some swelling on the right side.  She has had to really modify how she swims/workout because of this.  CTS - she does have this bilaterally although her left has been the most significant and bothersome over the last few years.  She does wear cock-up wrist braces nightly, she is on gabapentin  300 mg in the morning and 600 mg in the evening.  We have performed ultrasound-guided carpal tunnel injection back on 06/14/2023 which helped excellently and gave her many months of relief, we did repeat this in 11/03/2023 which was certainly helpful but only lasted for 1 to 2 months before her pain returned.  She has now had persistent numbness and tingling in the 1st through 3rd digit, despite conservative measures.  She is interested in possible surgical intervention.  Pertinent ROS were reviewed with the patient and found to be negative unless otherwise specified above in HPI.   Assessment & Plan: Visit Diagnoses:  1. Carpal tunnel syndrome, left upper limb   2. Rotator cuff arthropathy of both shoulders   3. Primary osteoarthritis of shoulders, bilateral   4. Carpal tunnel syndrome, right upper limb    Plan: Impression is left wrist severe carpal tunnel syndrome which is symptomatic, at this point she is having consistent numbness and tingling of the 1st through 3rd digits.   She is managed on gabapentin  twice daily as well as nighttime splinting for years.  She has had previous carpal tunnel injections, the last in March which gave her excellent relief for at least 4 months, but the most recent back in early August which only helped for about 1-2 months before her pain returned.  Ultrasound examination shows median nerve compression under the transverse carpal ligament with CSA of 13 mm at the outlet and inlet.  At this point, given that nonsurgical interventions have only been temporary, she would like to move forward with surgical release.  I do think she would be a good candidate to perform this under local anesthesia, we will have her make an appointment with my hand surgeon, Dr. Erwin to discuss this.  In the meantime she will continue her gabapentin  300 mg a.m., 600 mg nightly as well as continue her bilateral cock-up wrist braces.  She does have right sided carpal tunnel syndrome but this is certainly more mild.  She does have bilateral shoulder pain with evidence of osteoarthritis as well as rotator cuff arthropathy, she will continue her swimming with modifications and her HEP as needed.  She does find benefit from Celebrex  100 mg daily, she may continue this.  We discussed following up for these in a few weeks, could consider repeat aspiration and subsequent injection R > L shoulder if needed, will hold for now.  Follow-up: Return for Will make appt to see Dr. Erwin  for discussion on CTS surgery Left.   Meds & Orders: No orders of the defined types were placed in this encounter.   Orders Placed This Encounter  Procedures   US  Extrem Up Left Ltd     Procedures: No procedures performed      Clinical History: No specialty comments available.  She reports that she quit smoking about 50 years ago. Her smoking use included cigarettes. She started smoking about 70 years ago. She has never used smokeless tobacco. No results for input(s): HGBA1C, LABURIC in the last  8760 hours.  Objective:    Physical Exam  Gen: Well-appearing, in no acute distress; non-toxic CV: Well-perfused. Warm.  Resp: Breathing unlabored on room air; no wheezing. Psych: Fluid speech in conversation; appropriate affect; normal thought process  Ortho Exam - Left wrist: + There is a tender, mobile swelling over the volar wrist, likely indicative of ganglion cyst.  Positive Tinel's at the carpal tunnel.  There is a degree of thenar eminence atrophy.  Cap refill less than 2 seconds.  - Shoulders: There is a moderate+ effusion of the right shoulder, no effusion of the left shoulder.  Forward flexion of the right shoulder about 85 degrees before needs help with active assistance.  There is some crepitus at end range of motion tear.  Positive drop arm test.  Imaging: US  Extrem Up Left Ltd Result Date: 02/05/2024 Limited musculoskeletal ultrasound of the left volar wrist was performed today.  Evaluation of the carpal tunnel shows an enlarged as well as compressed median nerve.  Just distal to the carpal tunnel outlet the median nerve is approximately 1.83 cm in circumference as well as 13 mm in CSA (area).  Just proximal to the carpal tunnel inlet, the median nerve is approximately 1.92 cm in circumference and 13 mm in CSA (area) with notable flattening of the nerve underlying the transverse carpal ligament.  There is an associated volar ganglion cyst on the radial aspect of the wrist.   Enlargement and flattening of the median nerve with evidence of severe carpal tunnel syndrome.  There is an associated volar ganglion cyst.            Past Medical/Family/Surgical/Social History: Medications & Allergies reviewed per EMR, new medications updated. Patient Active Problem List   Diagnosis Date Noted   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   Neuropathy 07/31/2023   Pure hypercholesterolemia 11/14/2019   Gastroesophageal reflux disease 02/04/2019    Lumbar back pain with radiculopathy affecting left lower extremity 04/11/2016   Cervical spondylosis without myelopathy 06/08/2015   Absolute anemia 05/16/2014   Primary osteoarthritis involving multiple joints 05/16/2014   Essential hypertension 09/03/2012   Senile osteoporosis    B12 deficiency due to diet    Past Medical History:  Diagnosis Date   Anemia, unspecified    on meds   B12 deficiency due to diet    Basal cell carcinoma    LEFT UPPER ARM SUP. TX=CX3 5FU   Cataract    bilateral sx   Cramp of limb    Edema of both legs    Encounter for long-term (current) use of other medications    Hypertension    on meds   Lumbago    Melanoma (HCC) 10/18/2010   RIGHT POST SHOULDER =MOHS   Obesity    Osteoarthrosis, unspecified whether generalized or localized, unspecified site    generalized   Restless legs syndrome (RLS)    SCC (squamous cell carcinoma) 07/03/2017  RIGHT CHEST CX3 5FU   SCC (squamous cell carcinoma) 01/28/2019   LEFT FOREARM CX3 5FU   SCC (squamous cell carcinoma) [id number]   RIGHT FOREHEAD CX3 5FU   SCC (squamous cell carcinoma) 07/03/2017   RIGHT FOREARM CX3 5FU   SCCA (squamous cell carcinoma) of skin 04/08/2020   Right Anterior Mandible (in situ)(CX35FU)   SCCA (squamous cell carcinoma) of skin 04/08/2020   Left Supraorbital Region (Keratoacanthoma)   Senile osteoporosis    Squamous cell carcinoma of skin 01/30/2017   RIGHT JAWLINE TX WITH BX   Family History  Problem Relation Age of Onset   Diabetes Father    Prostate cancer Father    Rectal cancer Sister 58   Colon polyps Sister    Cancer Sister    Diabetes Brother    Prostate cancer Brother    Lung cancer Brother 11   Other Brother        declined after a fall   Stomach cancer Maternal Grandmother    Pancreatic cancer Neg Hx    Colon cancer Neg Hx    Esophageal cancer Neg Hx    Past Surgical History:  Procedure Laterality Date   CATARACT EXTRACTION W/ INTRAOCULAR LENS  IMPLANT,  BILATERAL  04/2014   COLONOSCOPY     EYE SURGERY Right 04/28/2016   Macular   LEFT HEART CATH AND CORONARY ANGIOGRAPHY N/A 05/15/2023   Procedure: LEFT HEART CATH AND CORONARY ANGIOGRAPHY;  Surgeon: Ladona Heinz, MD;  Location: [facility];  Service: Cardiovascular;  Laterality: N/A;   SPINE SURGERY  2005   STOMACH SURGERY  1981   stapleing    TONSILLECTOMY     UPPER GASTROINTESTINAL ENDOSCOPY     VITRECTOMY Right    with membrane peeling for a macular pucker   Social History   Occupational History   Occupation: Retired Nurse, Mental Health: [employer]  Tobacco Use   Smoking status: Former    Current packs/day: 0.00    Types: Cigarettes    Start date: 1955    Quit date: 1975    Years since quitting: 50.8   Smokeless tobacco: Never   Tobacco comments:    Quit in early 30's  Vaping Use   Vaping status: Never Used  Substance and Sexual Activity   Alcohol use: No    Alcohol/week: 0.0 standard drinks of alcohol   Drug use: No   Sexual activity: Never

## 2024-02-05 NOTE — Progress Notes (Signed)
 Patient says that she is overall much better than she was at her last visit. She says that she is back to her baseline, which is around a 5/10 pain. She has been able to do things around the house and has been able to do more of her ADLs. She says that her primary concern for today's visit is her carpal tunnel of the left wrist. She did not get as much relief from the last injection as she did the one prior to that, but she did get some relief. She is having increased pain at night.  Patient begins physical therapy this Thursday.

## 2024-02-06 ENCOUNTER — Other Ambulatory Visit (HOSPITAL_COMMUNITY): Payer: Self-pay | Admitting: Nurse Practitioner

## 2024-02-06 ENCOUNTER — Telehealth: Payer: Self-pay | Admitting: Orthopedic Surgery

## 2024-02-06 DIAGNOSIS — R1310 Dysphagia, unspecified: Secondary | ICD-10-CM

## 2024-02-06 DIAGNOSIS — R059 Cough, unspecified: Secondary | ICD-10-CM

## 2024-02-06 NOTE — Telephone Encounter (Signed)
 Patient called and scheduled for Monday 17th

## 2024-02-06 NOTE — Telephone Encounter (Signed)
 It doesn't make a difference. The phone number for [facility] [address] is [phone]. Thanks, Evertt Bristol CMA

## 2024-02-06 NOTE — Telephone Encounter (Signed)
 Patient called and said she needs to schedule with you and returning your call. CB#[phone]

## 2024-02-06 NOTE — Therapy (Signed)
 OUTPATIENT PHYSICAL THERAPY LOWER EXTREMITY EVALUATION   Patient Name: Jody [name] MRN: [id number] DOB:February 23, 1941, 83 y.o., female Today's Date: 02/08/2024  END OF SESSION:  PT End of Session - 02/08/24 1009     Visit Number 1    Number of Visits 20    Date for Recertification  04/18/24    Authorization Type MEDICARE AND [employer] LIFE    Progress Note Due on Visit 10    PT Start Time 1018    PT Stop Time 1110    PT Time Calculation (min) 52 min    Activity Tolerance Patient limited by pain    Behavior During Therapy WFL for tasks assessed/performed          Past Medical History:  Diagnosis Date   Anemia, unspecified    on meds   B12 deficiency due to diet    Basal cell carcinoma    LEFT UPPER ARM SUP. TX=CX3 5FU   Cataract    bilateral sx   Cramp of limb    Edema of both legs    Encounter for long-term (current) use of other medications    Hypertension    on meds   Lumbago    Melanoma (HCC) 10/18/2010   RIGHT POST SHOULDER =MOHS   Obesity    Osteoarthrosis, unspecified whether generalized or localized, unspecified site    generalized   Restless legs syndrome (RLS)    SCC (squamous cell carcinoma) 07/03/2017   RIGHT CHEST CX3 5FU   SCC (squamous cell carcinoma) 01/28/2019   LEFT FOREARM CX3 5FU   SCC (squamous cell carcinoma) [id number]   RIGHT FOREHEAD CX3 5FU   SCC (squamous cell carcinoma) 07/03/2017   RIGHT FOREARM CX3 5FU   SCCA (squamous cell carcinoma) of skin 04/08/2020   Right Anterior Mandible (in situ)(CX35FU)   SCCA (squamous cell carcinoma) of skin 04/08/2020   Left Supraorbital Region (Keratoacanthoma)   Senile osteoporosis    Squamous cell carcinoma of skin 01/30/2017   RIGHT JAWLINE TX WITH BX   Past Surgical History:  Procedure Laterality Date   CATARACT EXTRACTION W/ INTRAOCULAR LENS  IMPLANT, BILATERAL  04/2014   COLONOSCOPY     EYE SURGERY Right 04/28/2016   Macular   LEFT HEART CATH AND CORONARY ANGIOGRAPHY N/A 05/15/2023    Procedure: LEFT HEART CATH AND CORONARY ANGIOGRAPHY;  Surgeon: Ladona Heinz, MD;  Location: [facility];  Service: Cardiovascular;  Laterality: N/A;   SPINE SURGERY  2005   STOMACH SURGERY  1981   stapleing    TONSILLECTOMY     UPPER GASTROINTESTINAL ENDOSCOPY     VITRECTOMY Right    with membrane peeling for a macular pucker   Patient Active Problem List   Diagnosis Date Noted   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   Neuropathy 07/31/2023   Pure hypercholesterolemia 11/14/2019   Gastroesophageal reflux disease 02/04/2019   Lumbar back pain with radiculopathy affecting left lower extremity 04/11/2016   Cervical spondylosis without myelopathy 06/08/2015   Absolute anemia 05/16/2014   Primary osteoarthritis involving multiple joints 05/16/2014   Essential hypertension 09/03/2012   Senile osteoporosis    B12 deficiency due to diet     PCP: Harlene MARLA An, NP  REFERRING PROVIDER: Lonell Sprang, DO  REFERRING DIAG: M17.0 (ICD-10-CM) - Bilateral primary osteoarthritis of knee M19.011,M19.012 (ICD-10-CM) - Primary osteoarthritis of shoulders, bilateral M12.811,M12.812 (ICD-10-CM) - Rotator cuff arthropathy of both shoulders R29.898 (ICD-10-CM) - Leg weakness, bilateral  THERAPY DIAG:  Chronic  pain of both knees  Muscle weakness (generalized)  Chronic pain of both shoulders  Other abnormalities of gait and mobility  Abnormal posture  Unsteadiness on feet  Rationale for Evaluation and Treatment: Rehabilitation  ONSET DATE: 01/28/2024 is when it worsened   SUBJECTIVE:   SUBJECTIVE STATEMENT: I have all original parts and they all hurt.   PERTINENT HISTORY: Patient reports that she has chronic bilat knee (Lt>Rt) pain and wears a brace on Lt knee. Patient endorses that she fell and fractured Lt patella in 2017 which is why it still hurts. Patient also endorses one other fall in 2018 out of her attic which led to a hospitalization  and cervical fusion. Patient endorses bilat shoulder pain (Rt>Lt) and requires assistance for reaching with Rt shoulder flexion. Patient recently finished a prednisone  regimen which has improved overall symptoms/pain back to her normal. Patient recently began feeling aching/trembling in Lt LE, has been experiencing generalized muscle atrophy and weakness, and requires aquatic therapy at least 4x/wk in order to continue to be able to get out of bed. Patient endorses one other orthopedic surgery in lower back with breakdown of adjacent vertebrae above and below. Patient has severe osteoporosis.   See PMH or personal factors for more in depth comorbidities  PAIN:  Are you having pain? Yes: NPRS scale: average 5/10; max 7-8/10 Pain location: shoulder, knees, LEs in general Pain description: consistent, debilitating Aggravating factors: standing, walking, reaching, functional IR  Relieving factors: aquatic therapy   PRECAUTIONS: Fall and high risk for fractures as she is severely osteoporotic   RED FLAGS: Bowel or bladder incontinence: Yes: referral in for a urologist and Cauda equina syndrome: No   WEIGHT BEARING RESTRICTIONS: No  FALLS:  Has patient fallen in last 6 months? No  LIVING ENVIRONMENT: Lives with: lives with their spouse Lives in: House/apartment Stairs: Yes: External: 1 from carport to inside the house steps; none; steps into the attic with pulldown rail which she goes into often Has following equipment at home: Single point cane, Environmental Consultant - 2 wheeled, Environmental Consultant - 4 wheeled, Marine Scientist  OCCUPATION: retired   PLOF: Independent and uses a Recruitment Consultant  PATIENT GOALS: to get a little stronger, to get out of a chair without making a spectacle   NEXT MD VISIT: 02/19/2024 with Dr. Burnetta   OBJECTIVE:  Note: Objective measures were completed at Evaluation unless otherwise noted.  DIAGNOSTIC FINDINGS:  X-rays demonstrate severe bone-on-bone collapse of the lateral  tibiofemoral joint space. There is notable subchondral sclerosis of the lateral femoral condyle and lateral tibial plateau. The knee is fall into a valgus fashion bilaterally. Lateral view of the left knee shows healed fragmentation of the patella likely from prior fracture. No acute fracture or acute bony abnormality otherwise noted.   PATIENT SURVEYS:  PSFS: THE PATIENT SPECIFIC FUNCTIONAL SCALE  Place score of 0-10 (0 = unable to perform activity and 10 = able to perform activity at the same level as before injury or problem)  Activity Date:  02/08/2024    Walking distance  5    2. Getting up from a chair  5    3. Using arms 3    4.  Balance and mobility  5    5. Walking with a purpose 3    Total Score 4.2      Total Score = Sum of activity scores/number of activities  Minimally Detectable Change: 3 points (for single activity); 2 points (for average score)  [facility] (nd). The Patient Specific Functional Scale . Retrieved from [email or web address].com.[email or web address]   COGNITION: Overall cognitive status: Within functional limits for tasks assessed     SENSATION: Light touch: WFL Patient endorses highly sensitive to touch on bilat LE   POSTURE: rounded shoulders, forward head, and increased thoracic kyphosis   LOWER EXTREMITY ROM:  Active ROM Right Eval 02/08/2024 Left Eval 02/08/2024  Hip flexion    Hip extension    Hip abduction    Hip adduction    Hip internal rotation    Hip external rotation    Knee flexion (seated) [facility] WFL  Knee extension (seated) WFL ~90% WFL  Ankle dorsiflexion    Ankle plantarflexion    Ankle inversion    Ankle eversion     (Blank rows = not tested)  UE ROM: 02/08/2024, seated AROM  Shoulder flexion: Lt: WFL Rt: 69deg Shoulder abduction: Lt: WFL, but painful around 90deg Rt: 60deg Shoulder functional IR: Lt: T12 Rt: iliac crest   Shoulder functional ER: Lt: T2 Rt: UT  LOWER  EXTREMITY MMT: painful with MMT   MMT Right Eval 02/08/2024 Left Eval 02/08/2024  Hip flexion (seated) 3+/5 3+/5  Hip extension    Hip abduction    Hip adduction    Hip internal rotation    Hip external rotation    Knee flexion (seated) 4-/5 4-/5  Knee extension (seated) 4/5 4-/5  Ankle dorsiflexion     Ankle plantarflexion    Ankle inversion    Ankle eversion     (Blank rows = not tested)   FUNCTIONAL TESTS:  5 times sit to stand: 13s from arm chair using bilat UEs after first stand    Slump 02/08/2024: positive on Rt side   GAIT: Distance walked: not objectively measured  Assistive device utilized: SPC/walking stick Level of assistance: supervision  Comments: antalgic and decreased speed                                                                                                                                TREATMENT DATE:  02/08/2024 TherEx:  HEP handout provided with patient performing one set of each activity for appropriate form. Demonstration, verbal and tactile cues provided.   Self-Care: POC, aquatic physical therapy (with handout provided), sciatica    PATIENT EDUCATION:  Education details: HEP, POC, aquatic PT, sciatica  Person educated: Patient Education method: Explanation, Demonstration, Tactile cues, Verbal cues, and Handouts Education comprehension: verbalized understanding, returned demonstration, verbal cues required, tactile cues required, and needs further education  HOME EXERCISE PROGRAM: Access Code: [email or web address] URL: [email or web address] Date: 02/08/2024 Prepared by: Susannah Daring  Exercises - Mini Squat with Counter Support  - 1 x daily - 7 x weekly - 2 sets - 5 reps - Seated Long Arc Quad  - 1 x daily - 7 x weekly - 2 sets - 10 reps - 2seconds hold - Standing Knee Flexion AROM with Chair Support  - 1 x  daily - 7 x weekly - 2 sets - 10 reps - 2seconds  hold - AAROM/AROM Shoulder Flexion at Cabinet  - 1 x daily - 7 x  weekly - 2 sets - 10 reps  ASSESSMENT:  CLINICAL IMPRESSION: Patient is a 83 y.o. F who was seen today for physical therapy evaluation and treatment for bilat shoulder, bilat knee, and cervical/back pain presenting with functional mobility deficits, motor coordination deficits, strength deficits, pain, and postural deficits. Patient is highly limited by pain levels and strength deficits. Patient will benefit from skilled PT to address above noted deficits.   OBJECTIVE IMPAIRMENTS: Abnormal gait, decreased activity tolerance, decreased balance, decreased coordination, decreased endurance, decreased mobility, difficulty walking, decreased ROM, decreased strength, improper body mechanics, postural dysfunction, and pain.   ACTIVITY LIMITATIONS: carrying, lifting, bending, sitting, standing, squatting, stairs, transfers, continence, dressing, and reach over head  PARTICIPATION LIMITATIONS: cleaning, community activity, and yard work  PERSONAL FACTORS: Age, Behavior pattern, Past/current experiences, Time since onset of injury/illness/exacerbation, and 3+ comorbidities: anemia, osteoporosis, history of cancer, cataracts, HTN, prior falls are also affecting patient's functional outcome.   REHAB POTENTIAL: Fair lacks compliance, high pain levels, multiple comorbidities   CLINICAL DECISION MAKING: Evolving/moderate complexity  EVALUATION COMPLEXITY: Moderate   GOALS: Goals reviewed with patient? Yes  SHORT TERM GOALS: Target date: 03/07/2024 Patient will show compliance with initial HEP. Baseline: Goal status: INITIAL  2.  Patient will report pain levels no greater than 6/10 in order to show an improvement in overall quality of life. Baseline:  Goal status: INITIAL   LONG TERM GOALS: Target date: 04/18/2024  Patient will be independent with final HEP in order to maintain and progress upon functional gains made within PT. Baseline:  Goal status: INITIAL  2.  Patient will report pain  levels no greater than 5/10 in order to show an improvement in overall quality of life. Baseline:  Goal status: INITIAL  3.  Patient will increase PSFS to at least 6.2 in order to show a significant improvement in subjective disability. Baseline:  Goal status: INITIAL  4.  Patient will perform 5xSTS to at least 12s in order to decrease risk of falls.  Baseline:  Goal status: INITIAL  5.  Patient will improve Rt shoulder flexion and abduction to at least 90deg to improve overall functional mobility.  Baseline:  Goal status: INITIAL  6.  Patient will increase bilat hip flexion strength to at least 4/5 in order to improve biomechanics with functional mobility. Baseline:  Goal status: INITIAL   PLAN:  PT FREQUENCY: 1-2x/week  PT DURATION: 10 weeks  PLANNED INTERVENTIONS: 97164- PT Re-evaluation, 97750- Physical Performance Testing, 97110-Therapeutic exercises, 97530- Therapeutic activity, [id number]- Neuromuscular re-education, 97535- Self Care, 02859- Manual therapy, [id number]- Gait training, [phone]- Orthotic Initial, [phone]- Orthotic/Prosthetic subsequent, [phone]- Canalith repositioning, [phone]- Aquatic Therapy, [phone]- Electrical stimulation (unattended), [phone]- Electrical stimulation (manual), [id number]- Vasopneumatic device, [id number]- Ultrasound, [id number]- Ionotophoresis 4mg /ml Dexamethasone , 79439 (1-2 muscles), 20561 (3+ muscles)- Dry Needling, Patient/Family education, Balance training, Stair training, Taping, Joint mobilization, Spinal mobilization, Vestibular training, DME instructions, Cryotherapy, and Moist heat  PLAN FOR NEXT SESSION: review HEP, UE strength and mobility, LE strength and mobility, weightbearing activities, balance assessment, TUG    Susannah Daring, PT, DPT 02/08/24 3:48 PM

## 2024-02-08 ENCOUNTER — Ambulatory Visit (INDEPENDENT_AMBULATORY_CARE_PROVIDER_SITE_OTHER)

## 2024-02-08 DIAGNOSIS — M25561 Pain in right knee: Secondary | ICD-10-CM

## 2024-02-08 DIAGNOSIS — M25562 Pain in left knee: Secondary | ICD-10-CM

## 2024-02-08 DIAGNOSIS — M25512 Pain in left shoulder: Secondary | ICD-10-CM

## 2024-02-08 DIAGNOSIS — M6281 Muscle weakness (generalized): Secondary | ICD-10-CM

## 2024-02-08 DIAGNOSIS — R2681 Unsteadiness on feet: Secondary | ICD-10-CM

## 2024-02-08 DIAGNOSIS — R2689 Other abnormalities of gait and mobility: Secondary | ICD-10-CM

## 2024-02-08 DIAGNOSIS — R293 Abnormal posture: Secondary | ICD-10-CM

## 2024-02-08 DIAGNOSIS — M25511 Pain in right shoulder: Secondary | ICD-10-CM | POA: Diagnosis not present

## 2024-02-08 DIAGNOSIS — G8929 Other chronic pain: Secondary | ICD-10-CM

## 2024-02-11 NOTE — H&P (View-Only) (Signed)
 Jody [name] - 83 y.o. female MRN [id number]  Date of birth: Mar 07, 1941  Office Visit Note: Visit Date: 02/12/2024 PCP: Caro Harlene POUR, NP Referred by: Caro Harlene POUR, NP  Subjective: No chief complaint on file.  HPI: Jody [name] is a pleasant 83 y.o. female who presents today as a referral from Dr. Burnetta for ongoing bilateral carpal tunnel syndrome.  She states that the left has been the most significant and bothersome over the last few years.  She does wear cock-up wrist braces nightly, she is on gabapentin  300 mg in the morning and 600 mg in the evening.  She has undergone ultrasound-guided carpal tunnel injection with Dr. Burnetta back on 06/14/2023 which helped excellently and gave her many months of relief, this was performed again in 11/03/2023 but only lasted for 1 to 2 months before her pain returned.  She has now had persistent numbness and tingling in the 1st through 3rd digit, despite conservative measures.  She is interested in possible surgical intervention.   Pertinent ROS were reviewed with the patient and found to be negative unless otherwise specified above in HPI.   Visit Reason: Bilateral carpal tunnel syndrome, left greater than right Duration of symptoms: Hand dominance: {RIGHT/LEFT:20294} Occupation: Diabetic: {yes/no:20286} Smoking: {yes/no:20286} Heart/Lung History: Blood Thinners:   Prior Testing/EMG: Injections (Date): Treatments: Prior Surgery:    Assessment & Plan: Visit Diagnoses: No diagnosis found.  Plan: Extensive discussion was had with the patient today about her ongoing left sided carpal tunnel syndrome that is refractory to conservative care.  Patient has both clinical electrodiagnostic evidence to confirm this diagnosis.  At this juncture, she is indicated for left open versus endoscopic carpal tunnel release.  Risks and benefits of both operations were discussed in detail today.  Understanding all risks and benefits, patient would like  to have surgery done in the form of *** carpal tunnel release under ***.  Risks include but not limited to infection, bleeding, scarring, stiffness, nerve injury or vascular, tendon injury, risk of recurrence and need for subsequent operation were all discussed in detail.  Patient consented understanding the above.  Will move forward surgical scheduling.   Follow-up: No follow-ups on file.   Meds & Orders: No orders of the defined types were placed in this encounter.  No orders of the defined types were placed in this encounter.    Procedures: No procedures performed      Clinical History: No specialty comments available.  She reports that she quit smoking about 50 years ago. Her smoking use included cigarettes. She started smoking about 70 years ago. She has never used smokeless tobacco. No results for input(s): HGBA1C, LABURIC in the last 8760 hours.  Objective:   Vital Signs: There were no vitals taken for this visit.  Physical Exam  Gen: Well-appearing, in no acute distress; non-toxic CV: Regular Rate. Well-perfused. Warm.  Resp: Breathing unlabored on room air; no wheezing. Psych: Fluid speech in conversation; appropriate affect; normal thought process  Ortho Exam PHYSICAL EXAM:  General: Patient is well appearing and in no distress. Cervical spine mobility is full in all directions:  Skin and Muscle: No significant skin changes are apparent to upper extremities.   Range of Motion and Palpation Tests: Mobility is full about the elbows with flexion and extension. Wrist flexion/extension is 75/65 bilaterally.  Digital flexion and extension are full.  Thumb opposition is full to the base of the small fingers bilaterally.    No cords or nodules are palpated.  No triggering is observed.     Neurologic, Vascular, Motor: Sensation is diminished to light touch in the *** distribution.    Thenar atrophy: *** Tinel sign: *** Carpal tunnel compression: *** Phalen test:  ***  Sensory *** hand 2-point discrimination (thumb, index, middle): ***  Motor *** hand FPL: *** Index FDP:  APB: *** Grip strength Jamar 2, Right ***, Left ***  Fingers pink and well perfused.  Capillary refill is brisk.     Lab Results  Component Value Date   HGBA1C 5.5 11/14/2019      Imaging: No results found.  Past Medical/Family/Surgical/Social History: Medications & Allergies reviewed per EMR, new medications updated. Patient Active Problem List   Diagnosis Date Noted   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   Neuropathy 07/31/2023   Pure hypercholesterolemia 11/14/2019   Gastroesophageal reflux disease 02/04/2019   Lumbar back pain with radiculopathy affecting left lower extremity 04/11/2016   Cervical spondylosis without myelopathy 06/08/2015   Absolute anemia 05/16/2014   Primary osteoarthritis involving multiple joints 05/16/2014   Essential hypertension 09/03/2012   Senile osteoporosis    B12 deficiency due to diet    Past Medical History:  Diagnosis Date   Anemia, unspecified    on meds   B12 deficiency due to diet    Basal cell carcinoma    LEFT UPPER ARM SUP. TX=CX3 5FU   Cataract    bilateral sx   Cramp of limb    Edema of both legs    Encounter for long-term (current) use of other medications    Hypertension    on meds   Lumbago    Melanoma (HCC) 10/18/2010   RIGHT POST SHOULDER =MOHS   Obesity    Osteoarthrosis, unspecified whether generalized or localized, unspecified site    generalized   Restless legs syndrome (RLS)    SCC (squamous cell carcinoma) 07/03/2017   RIGHT CHEST CX3 5FU   SCC (squamous cell carcinoma) 01/28/2019   LEFT FOREARM CX3 5FU   SCC (squamous cell carcinoma) [id number]   RIGHT FOREHEAD CX3 5FU   SCC (squamous cell carcinoma) 07/03/2017   RIGHT FOREARM CX3 5FU   SCCA (squamous cell carcinoma) of skin 04/08/2020   Right Anterior Mandible (in situ)(CX35FU)   SCCA (squamous cell  carcinoma) of skin 04/08/2020   Left Supraorbital Region (Keratoacanthoma)   Senile osteoporosis    Squamous cell carcinoma of skin 01/30/2017   RIGHT JAWLINE TX WITH BX   Family History  Problem Relation Age of Onset   Diabetes Father    Prostate cancer Father    Rectal cancer Sister 68   Colon polyps Sister    Cancer Sister    Diabetes Brother    Prostate cancer Brother    Lung cancer Brother 28   Other Brother        declined after a fall   Stomach cancer Maternal Grandmother    Pancreatic cancer Neg Hx    Colon cancer Neg Hx    Esophageal cancer Neg Hx    Past Surgical History:  Procedure Laterality Date   CATARACT EXTRACTION W/ INTRAOCULAR LENS  IMPLANT, BILATERAL  04/2014   COLONOSCOPY     EYE SURGERY Right 04/28/2016   Macular   LEFT HEART CATH AND CORONARY ANGIOGRAPHY N/A 05/15/2023   Procedure: LEFT HEART CATH AND CORONARY ANGIOGRAPHY;  Surgeon: Ladona Heinz, MD;  Location: [facility];  Service: Cardiovascular;  Laterality: N/A;   SPINE SURGERY  2005  STOMACH SURGERY  1981   stapleing    TONSILLECTOMY     UPPER GASTROINTESTINAL ENDOSCOPY     VITRECTOMY Right    with membrane peeling for a macular pucker   Social History   Occupational History   Occupation: Retired Nurse, Mental Health: [employer]  Tobacco Use   Smoking status: Former    Current packs/day: 0.00    Types: Cigarettes    Start date: 1955    Quit date: 1975    Years since quitting: 50.9   Smokeless tobacco: Never   Tobacco comments:    Quit in early 30's  Vaping Use   Vaping status: Never Used  Substance and Sexual Activity   Alcohol use: No    Alcohol/week: 0.0 standard drinks of alcohol   Drug use: No   Sexual activity: Never    Edahi Kroening Estela) Arlinda, M.D. [facility], Hand Surgery

## 2024-02-11 NOTE — Progress Notes (Unsigned)
 Jody [name] - 83 y.o. female MRN [id number]  Date of birth: Mar 07, 1941  Office Visit Note: Visit Date: 02/12/2024 PCP: Caro Harlene POUR, NP Referred by: Caro Harlene POUR, NP  Subjective: No chief complaint on file.  HPI: Jody [name] is a pleasant 83 y.o. female who presents today as a referral from Dr. Burnetta for ongoing bilateral carpal tunnel syndrome.  She states that the left has been the most significant and bothersome over the last few years.  She does wear cock-up wrist braces nightly, she is on gabapentin  300 mg in the morning and 600 mg in the evening.  She has undergone ultrasound-guided carpal tunnel injection with Dr. Burnetta back on 06/14/2023 which helped excellently and gave her many months of relief, this was performed again in 11/03/2023 but only lasted for 1 to 2 months before her pain returned.  She has now had persistent numbness and tingling in the 1st through 3rd digit, despite conservative measures.  She is interested in possible surgical intervention.   Pertinent ROS were reviewed with the patient and found to be negative unless otherwise specified above in HPI.   Visit Reason: Bilateral carpal tunnel syndrome, left greater than right Duration of symptoms: Hand dominance: {RIGHT/LEFT:20294} Occupation: Diabetic: {yes/no:20286} Smoking: {yes/no:20286} Heart/Lung History: Blood Thinners:   Prior Testing/EMG: Injections (Date): Treatments: Prior Surgery:    Assessment & Plan: Visit Diagnoses: No diagnosis found.  Plan: Extensive discussion was had with the patient today about her ongoing left sided carpal tunnel syndrome that is refractory to conservative care.  Patient has both clinical electrodiagnostic evidence to confirm this diagnosis.  At this juncture, she is indicated for left open versus endoscopic carpal tunnel release.  Risks and benefits of both operations were discussed in detail today.  Understanding all risks and benefits, patient would like  to have surgery done in the form of *** carpal tunnel release under ***.  Risks include but not limited to infection, bleeding, scarring, stiffness, nerve injury or vascular, tendon injury, risk of recurrence and need for subsequent operation were all discussed in detail.  Patient consented understanding the above.  Will move forward surgical scheduling.   Follow-up: No follow-ups on file.   Meds & Orders: No orders of the defined types were placed in this encounter.  No orders of the defined types were placed in this encounter.    Procedures: No procedures performed      Clinical History: No specialty comments available.  She reports that she quit smoking about 50 years ago. Her smoking use included cigarettes. She started smoking about 70 years ago. She has never used smokeless tobacco. No results for input(s): HGBA1C, LABURIC in the last 8760 hours.  Objective:   Vital Signs: There were no vitals taken for this visit.  Physical Exam  Gen: Well-appearing, in no acute distress; non-toxic CV: Regular Rate. Well-perfused. Warm.  Resp: Breathing unlabored on room air; no wheezing. Psych: Fluid speech in conversation; appropriate affect; normal thought process  Ortho Exam PHYSICAL EXAM:  General: Patient is well appearing and in no distress. Cervical spine mobility is full in all directions:  Skin and Muscle: No significant skin changes are apparent to upper extremities.   Range of Motion and Palpation Tests: Mobility is full about the elbows with flexion and extension. Wrist flexion/extension is 75/65 bilaterally.  Digital flexion and extension are full.  Thumb opposition is full to the base of the small fingers bilaterally.    No cords or nodules are palpated.  No triggering is observed.     Neurologic, Vascular, Motor: Sensation is diminished to light touch in the *** distribution.    Thenar atrophy: *** Tinel sign: *** Carpal tunnel compression: *** Phalen test:  ***  Sensory *** hand 2-point discrimination (thumb, index, middle): ***  Motor *** hand FPL: *** Index FDP:  APB: *** Grip strength Jamar 2, Right ***, Left ***  Fingers pink and well perfused.  Capillary refill is brisk.     Lab Results  Component Value Date   HGBA1C 5.5 11/14/2019      Imaging: No results found.  Past Medical/Family/Surgical/Social History: Medications & Allergies reviewed per EMR, new medications updated. Patient Active Problem List   Diagnosis Date Noted   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   Neuropathy 07/31/2023   Pure hypercholesterolemia 11/14/2019   Gastroesophageal reflux disease 02/04/2019   Lumbar back pain with radiculopathy affecting left lower extremity 04/11/2016   Cervical spondylosis without myelopathy 06/08/2015   Absolute anemia 05/16/2014   Primary osteoarthritis involving multiple joints 05/16/2014   Essential hypertension 09/03/2012   Senile osteoporosis    B12 deficiency due to diet    Past Medical History:  Diagnosis Date   Anemia, unspecified    on meds   B12 deficiency due to diet    Basal cell carcinoma    LEFT UPPER ARM SUP. TX=CX3 5FU   Cataract    bilateral sx   Cramp of limb    Edema of both legs    Encounter for long-term (current) use of other medications    Hypertension    on meds   Lumbago    Melanoma (HCC) 10/18/2010   RIGHT POST SHOULDER =MOHS   Obesity    Osteoarthrosis, unspecified whether generalized or localized, unspecified site    generalized   Restless legs syndrome (RLS)    SCC (squamous cell carcinoma) 07/03/2017   RIGHT CHEST CX3 5FU   SCC (squamous cell carcinoma) 01/28/2019   LEFT FOREARM CX3 5FU   SCC (squamous cell carcinoma) [id number]   RIGHT FOREHEAD CX3 5FU   SCC (squamous cell carcinoma) 07/03/2017   RIGHT FOREARM CX3 5FU   SCCA (squamous cell carcinoma) of skin 04/08/2020   Right Anterior Mandible (in situ)(CX35FU)   SCCA (squamous cell  carcinoma) of skin 04/08/2020   Left Supraorbital Region (Keratoacanthoma)   Senile osteoporosis    Squamous cell carcinoma of skin 01/30/2017   RIGHT JAWLINE TX WITH BX   Family History  Problem Relation Age of Onset   Diabetes Father    Prostate cancer Father    Rectal cancer Sister 68   Colon polyps Sister    Cancer Sister    Diabetes Brother    Prostate cancer Brother    Lung cancer Brother 28   Other Brother        declined after a fall   Stomach cancer Maternal Grandmother    Pancreatic cancer Neg Hx    Colon cancer Neg Hx    Esophageal cancer Neg Hx    Past Surgical History:  Procedure Laterality Date   CATARACT EXTRACTION W/ INTRAOCULAR LENS  IMPLANT, BILATERAL  04/2014   COLONOSCOPY     EYE SURGERY Right 04/28/2016   Macular   LEFT HEART CATH AND CORONARY ANGIOGRAPHY N/A 05/15/2023   Procedure: LEFT HEART CATH AND CORONARY ANGIOGRAPHY;  Surgeon: Ladona Heinz, MD;  Location: [facility];  Service: Cardiovascular;  Laterality: N/A;   SPINE SURGERY  2005  STOMACH SURGERY  1981   stapleing    TONSILLECTOMY     UPPER GASTROINTESTINAL ENDOSCOPY     VITRECTOMY Right    with membrane peeling for a macular pucker   Social History   Occupational History   Occupation: Retired Nurse, Mental Health: [employer]  Tobacco Use   Smoking status: Former    Current packs/day: 0.00    Types: Cigarettes    Start date: 1955    Quit date: 1975    Years since quitting: 50.9   Smokeless tobacco: Never   Tobacco comments:    Quit in early 30's  Vaping Use   Vaping status: Never Used  Substance and Sexual Activity   Alcohol use: No    Alcohol/week: 0.0 standard drinks of alcohol   Drug use: No   Sexual activity: Never    Edahi Kroening Estela) Arlinda, M.D. [facility], Hand Surgery

## 2024-02-12 ENCOUNTER — Ambulatory Visit (INDEPENDENT_AMBULATORY_CARE_PROVIDER_SITE_OTHER): Admitting: Orthopedic Surgery

## 2024-02-12 DIAGNOSIS — G5602 Carpal tunnel syndrome, left upper limb: Secondary | ICD-10-CM | POA: Diagnosis not present

## 2024-02-15 NOTE — Therapy (Incomplete)
 OUTPATIENT PHYSICAL THERAPY LOWER EXTREMITY EVALUATION   Patient Name: Jody [name] MRN: [id number] DOB:[birth year], 83 y.o., female Today's Date: 02/15/2024  END OF SESSION:    Past Medical History:  Diagnosis Date   Anemia, unspecified    on meds   B12 deficiency due to diet    Basal cell carcinoma    LEFT UPPER ARM SUP. TX=CX3 5FU   Cataract    bilateral sx   Cramp of limb    Edema of both legs    Encounter for long-term (current) use of other medications    Hypertension    on meds   Lumbago    Melanoma (HCC) 10/18/2010   RIGHT POST SHOULDER =MOHS   Obesity    Osteoarthrosis, unspecified whether generalized or localized, unspecified site    generalized   Restless legs syndrome (RLS)    SCC (squamous cell carcinoma) 07/03/2017   RIGHT CHEST CX3 5FU   SCC (squamous cell carcinoma) 01/28/2019   LEFT FOREARM CX3 5FU   SCC (squamous cell carcinoma) [id number]   RIGHT FOREHEAD CX3 5FU   SCC (squamous cell carcinoma) 07/03/2017   RIGHT FOREARM CX3 5FU   SCCA (squamous cell carcinoma) of skin 04/08/2020   Right Anterior Mandible (in situ)(CX35FU)   SCCA (squamous cell carcinoma) of skin 04/08/2020   Left Supraorbital Region (Keratoacanthoma)   Senile osteoporosis    Squamous cell carcinoma of skin 01/30/2017   RIGHT JAWLINE TX WITH BX   Past Surgical History:  Procedure Laterality Date   CATARACT EXTRACTION W/ INTRAOCULAR LENS  IMPLANT, BILATERAL  04/2014   COLONOSCOPY     EYE SURGERY Right 04/28/2016   Macular   LEFT HEART CATH AND CORONARY ANGIOGRAPHY N/A 05/15/2023   Procedure: LEFT HEART CATH AND CORONARY ANGIOGRAPHY;  Surgeon: Ladona Heinz, MD;  Location: [facility];  Service: Cardiovascular;  Laterality: N/A;   SPINE SURGERY  2005   STOMACH SURGERY  1981   stapleing    TONSILLECTOMY     UPPER GASTROINTESTINAL ENDOSCOPY     VITRECTOMY Right    with membrane peeling for a macular pucker   Patient Active Problem List   Diagnosis Date Noted    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   Neuropathy 07/31/2023   Pure hypercholesterolemia 11/14/2019   Gastroesophageal reflux disease 02/04/2019   Lumbar back pain with radiculopathy affecting left lower extremity 04/11/2016   Cervical spondylosis without myelopathy 06/08/2015   Absolute anemia 05/16/2014   Primary osteoarthritis involving multiple joints 05/16/2014   Essential hypertension 09/03/2012   Senile osteoporosis    B12 deficiency due to diet     PCP: Harlene MARLA An, NP  REFERRING PROVIDER: Lonell Sprang, DO  REFERRING DIAG: M17.0 (ICD-10-CM) - Bilateral primary osteoarthritis of knee M19.011,M19.012 (ICD-10-CM) - Primary osteoarthritis of shoulders, bilateral M12.811,M12.812 (ICD-10-CM) - Rotator cuff arthropathy of both shoulders R29.898 (ICD-10-CM) - Leg weakness, bilateral  THERAPY DIAG:  No diagnosis found.  Rationale for Evaluation and Treatment: Rehabilitation  ONSET DATE: 01/28/2024 is when it worsened   SUBJECTIVE:   SUBJECTIVE STATEMENT: *** I have all original parts and they all hurt.   PERTINENT HISTORY: Patient reports that she has chronic bilat knee (Lt>Rt) pain and wears a brace on Lt knee. Patient endorses that she fell and fractured Lt patella in 2017 which is why it still hurts. Patient also endorses one other fall in 2018 out of her attic which led to a hospitalization and cervical fusion. Patient endorses bilat shoulder pain (  Rt>Lt) and requires assistance for reaching with Rt shoulder flexion. Patient recently finished a prednisone  regimen which has improved overall symptoms/pain back to her normal. Patient recently began feeling aching/trembling in Lt LE, has been experiencing generalized muscle atrophy and weakness, and requires aquatic therapy at least 4x/wk in order to continue to be able to get out of bed. Patient endorses one other orthopedic surgery in lower back with breakdown of adjacent vertebrae above  and below. Patient has severe osteoporosis.   See PMH or personal factors for more in depth comorbidities  PAIN:  Are you having pain? Yes: NPRS scale: average 5/10; max 7-8/10 Pain location: shoulder, knees, LEs in general Pain description: consistent, debilitating Aggravating factors: standing, walking, reaching, functional IR  Relieving factors: aquatic therapy   PRECAUTIONS: Fall and high risk for fractures as she is severely osteoporotic   RED FLAGS: Bowel or bladder incontinence: Yes: referral in for a urologist and Cauda equina syndrome: No   WEIGHT BEARING RESTRICTIONS: No  FALLS:  Has patient fallen in last 6 months? No  LIVING ENVIRONMENT: Lives with: lives with their spouse Lives in: House/apartment Stairs: Yes: External: 1 from carport to inside the house steps; none; steps into the attic with pulldown rail which she goes into often Has following equipment at home: Single point cane, Environmental Consultant - 2 wheeled, Environmental Consultant - 4 wheeled, Marine Scientist  OCCUPATION: retired   PLOF: Independent and uses a Recruitment Consultant  PATIENT GOALS: to get a little stronger, to get out of a chair without making a spectacle   NEXT MD VISIT: 02/19/2024 with Dr. Burnetta   OBJECTIVE:  Note: Objective measures were completed at Evaluation unless otherwise noted.  DIAGNOSTIC FINDINGS:  X-rays demonstrate severe bone-on-bone collapse of the lateral tibiofemoral joint space. There is notable subchondral sclerosis of the lateral femoral condyle and lateral tibial plateau. The knee is fall into a valgus fashion bilaterally. Lateral view of the left knee shows healed fragmentation of the patella likely from prior fracture. No acute fracture or acute bony abnormality otherwise noted.   PATIENT SURVEYS:  PSFS: THE PATIENT SPECIFIC FUNCTIONAL SCALE  Place score of 0-10 (0 = unable to perform activity and 10 = able to perform activity at the same level as before injury or problem)  Activity Date:   02/08/2024    Walking distance  5    2. Getting up from a chair  5    3. Using arms 3    4.  Balance and mobility  5    5. Walking with a purpose 3    Total Score 4.2      Total Score = Sum of activity scores/number of activities  Minimally Detectable Change: 3 points (for single activity); 2 points (for average score)  [facility] (nd). The Patient Specific Functional Scale . Retrieved from [email or web address].com.pt   COGNITION: Overall cognitive status: Within functional limits for tasks assessed     SENSATION: Light touch: WFL Patient endorses highly sensitive to touch on bilat LE   POSTURE: rounded shoulders, forward head, and increased thoracic kyphosis   LOWER EXTREMITY ROM:  Active ROM Right Eval 02/08/2024 Left Eval 02/08/2024  Hip flexion    Hip extension    Hip abduction    Hip adduction    Hip internal rotation    Hip external rotation    Knee flexion (seated) [facility] WFL  Knee extension (seated) WFL ~90% WFL  Ankle dorsiflexion    Ankle plantarflexion    Ankle  inversion    Ankle eversion     (Blank rows = not tested)  UE ROM: 02/08/2024, seated AROM  Shoulder flexion: Lt: WFL Rt: 69deg Shoulder abduction: Lt: WFL, but painful around 90deg Rt: 60deg Shoulder functional IR: Lt: T12 Rt: iliac crest   Shoulder functional ER: Lt: T2 Rt: UT  LOWER EXTREMITY MMT: painful with MMT   MMT Right Eval 02/08/2024 Left Eval 02/08/2024  Hip flexion (seated) 3+/5 3+/5  Hip extension    Hip abduction    Hip adduction    Hip internal rotation    Hip external rotation    Knee flexion (seated) 4-/5 4-/5  Knee extension (seated) 4/5 4-/5  Ankle dorsiflexion     Ankle plantarflexion    Ankle inversion    Ankle eversion     (Blank rows = not tested)   FUNCTIONAL TESTS:  5 times sit to stand: 13s from arm chair using bilat UEs after first stand    Slump 02/08/2024: positive on Rt side    GAIT: Distance walked: not objectively measured  Assistive device utilized: SPC/walking stick Level of assistance: supervision  Comments: antalgic and decreased speed                                                                                                                                TREATMENT DATE:  02/16/2024 ***    02/08/2024 TherEx:  HEP handout provided with patient performing one set of each activity for appropriate form. Demonstration, verbal and tactile cues provided.   Self-Care: POC, aquatic physical therapy (with handout provided), sciatica    PATIENT EDUCATION:  Education details: HEP, POC, aquatic PT, sciatica  Person educated: Patient Education method: Explanation, Demonstration, Tactile cues, Verbal cues, and Handouts Education comprehension: verbalized understanding, returned demonstration, verbal cues required, tactile cues required, and needs further education  HOME EXERCISE PROGRAM: Access Code: [email or web address] URL: [email or web address] Date: 02/08/2024 Prepared by: Susannah Daring  Exercises - Mini Squat with Counter Support  - 1 x daily - 7 x weekly - 2 sets - 5 reps - Seated Long Arc Quad  - 1 x daily - 7 x weekly - 2 sets - 10 reps - 2seconds hold - Standing Knee Flexion AROM with Chair Support  - 1 x daily - 7 x weekly - 2 sets - 10 reps - 2seconds  hold - AAROM/AROM Shoulder Flexion at Cabinet  - 1 x daily - 7 x weekly - 2 sets - 10 reps  ASSESSMENT:  CLINICAL IMPRESSION: *** Patient is a 83 y.o. F who was seen today for physical therapy evaluation and treatment for bilat shoulder, bilat knee, and cervical/back pain presenting with functional mobility deficits, motor coordination deficits, strength deficits, pain, and postural deficits. Patient is highly limited by pain levels and strength deficits. Patient will benefit from skilled PT to address above noted deficits.   OBJECTIVE IMPAIRMENTS: Abnormal gait, decreased activity  tolerance, decreased balance, decreased coordination, decreased endurance,  decreased mobility, difficulty walking, decreased ROM, decreased strength, improper body mechanics, postural dysfunction, and pain.   ACTIVITY LIMITATIONS: carrying, lifting, bending, sitting, standing, squatting, stairs, transfers, continence, dressing, and reach over head  PARTICIPATION LIMITATIONS: cleaning, community activity, and yard work  PERSONAL FACTORS: Age, Behavior pattern, Past/current experiences, Time since onset of injury/illness/exacerbation, and 3+ comorbidities: anemia, osteoporosis, history of cancer, cataracts, HTN, prior falls are also affecting patient's functional outcome.   REHAB POTENTIAL: Fair lacks compliance, high pain levels, multiple comorbidities   CLINICAL DECISION MAKING: Evolving/moderate complexity  EVALUATION COMPLEXITY: Moderate   GOALS: Goals reviewed with patient? Yes  SHORT TERM GOALS: Target date: 03/07/2024 Patient will show compliance with initial HEP. Baseline: Goal status: INITIAL  2.  Patient will report pain levels no greater than 6/10 in order to show an improvement in overall quality of life. Baseline:  Goal status: INITIAL   LONG TERM GOALS: Target date: 04/18/2024  Patient will be independent with final HEP in order to maintain and progress upon functional gains made within PT. Baseline:  Goal status: INITIAL  2.  Patient will report pain levels no greater than 5/10 in order to show an improvement in overall quality of life. Baseline:  Goal status: INITIAL  3.  Patient will increase PSFS to at least 6.2 in order to show a significant improvement in subjective disability. Baseline:  Goal status: INITIAL  4.  Patient will perform 5xSTS to at least 12s in order to decrease risk of falls.  Baseline:  Goal status: INITIAL  5.  Patient will improve Rt shoulder flexion and abduction to at least 90deg to improve overall functional mobility.  Baseline:   Goal status: INITIAL  6.  Patient will increase bilat hip flexion strength to at least 4/5 in order to improve biomechanics with functional mobility. Baseline:  Goal status: INITIAL   PLAN:  PT FREQUENCY: 1-2x/week  PT DURATION: 10 weeks  PLANNED INTERVENTIONS: 97164- PT Re-evaluation, 97750- Physical Performance Testing, 97110-Therapeutic exercises, 97530- Therapeutic activity, [id number]- Neuromuscular re-education, 97535- Self Care, 02859- Manual therapy, [id number]- Gait training, [phone]- Orthotic Initial, [phone]- Orthotic/Prosthetic subsequent, [phone]- Canalith repositioning, [phone]- Aquatic Therapy, [phone]- Electrical stimulation (unattended), [phone]- Electrical stimulation (manual), [id number]- Vasopneumatic device, [id number]- Ultrasound, [id number]- Ionotophoresis 4mg /ml Dexamethasone , 79439 (1-2 muscles), 20561 (3+ muscles)- Dry Needling, Patient/Family education, Balance training, Stair training, Taping, Joint mobilization, Spinal mobilization, Vestibular training, DME instructions, Cryotherapy, and Moist heat  PLAN FOR NEXT SESSION: ***review HEP, UE strength and mobility, LE strength and mobility, weightbearing activities, balance assessment, TUG    Susannah Daring, PT, DPT 02/15/24 4:07 PM

## 2024-02-16 ENCOUNTER — Telehealth: Payer: Self-pay

## 2024-02-16 ENCOUNTER — Encounter

## 2024-02-16 NOTE — Telephone Encounter (Signed)
 PT called patient as patient did not show up for 1100 session this date. Patient answered phone call and let PT know that this was an add-on appointment that she had completely forgotten about. PT reminded patient of next session on 02/28/2024 at 0930.  Susannah Daring, PT, DPT 02/16/24 11:29 AM

## 2024-02-19 ENCOUNTER — Other Ambulatory Visit: Payer: Self-pay

## 2024-02-19 ENCOUNTER — Encounter: Payer: Self-pay | Admitting: Sports Medicine

## 2024-02-19 ENCOUNTER — Ambulatory Visit: Admitting: Sports Medicine

## 2024-02-19 DIAGNOSIS — M25511 Pain in right shoulder: Secondary | ICD-10-CM

## 2024-02-19 DIAGNOSIS — M25411 Effusion, right shoulder: Secondary | ICD-10-CM

## 2024-02-19 DIAGNOSIS — G5602 Carpal tunnel syndrome, left upper limb: Secondary | ICD-10-CM | POA: Diagnosis not present

## 2024-02-19 DIAGNOSIS — M170 Bilateral primary osteoarthritis of knee: Secondary | ICD-10-CM | POA: Diagnosis not present

## 2024-02-19 DIAGNOSIS — M12811 Other specific arthropathies, not elsewhere classified, right shoulder: Secondary | ICD-10-CM

## 2024-02-19 DIAGNOSIS — G8929 Other chronic pain: Secondary | ICD-10-CM

## 2024-02-19 MED ORDER — LIDOCAINE HCL 1 % IJ SOLN
4.0000 mL | INTRAMUSCULAR | Status: AC | PRN
  Administered 2024-02-19: 4 mL

## 2024-02-19 MED ORDER — BUPIVACAINE HCL 0.25 % IJ SOLN
2.0000 mL | INTRAMUSCULAR | Status: AC | PRN
  Administered 2024-02-19: 2 mL via INTRA_ARTICULAR

## 2024-02-19 MED ORDER — METHYLPREDNISOLONE ACETATE 40 MG/ML IJ SUSP
40.0000 mg | INTRAMUSCULAR | Status: AC | PRN
  Administered 2024-02-19: 40 mg via INTRA_ARTICULAR

## 2024-02-19 NOTE — Progress Notes (Signed)
 CRISTABEL BICKNELL - 83 y.o. female MRN [id number]  Date of birth: [birth year]  Office Visit Note: Visit Date: 02/19/2024 PCP: Caro Harlene POUR, NP Referred by: Caro Harlene POUR, NP  Subjective: Chief Complaint  Patient presents with   Right Shoulder - Follow-up   HPI: Jody [name] is a pleasant 83 y.o. female who presents today for evaluation and treatment of acute on chronic right > left shoulder. Has plan for L-CTS. Bilateral knee pain.  R > L shoulder -she did have her first evaluation in physical therapy appointment, does feel like this went well.  The right shoulder however is painful and does feel her effusion is restricting her motion.  She would like to move forward with ultrasound-guided aspiration and injection today.  Continues on her Celebrex  100 mg daily.  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  She has seen Dr. Erwin for this, she is scheduled for left carpal tunnel release upcoming on 03/08/24.  Continues on gabapentin  300 mg AM, 600mg  PM.  Bilateral knees -these are doing stable, does have chronic knee pain but left is worse than right.  She does wear her unloader brace on the left knee.  History of fracture patella in 2017.  Pertinent ROS were reviewed with the patient and found to be negative unless otherwise specified above in HPI.   Assessment & Plan: Visit Diagnoses:  1. Chronic right shoulder pain   2. Effusion of joint of right shoulder   3. Rotator cuff arthropathy of both shoulders   4. Carpal tunnel syndrome, left upper limb   5. Bilateral primary osteoarthritis of knee    Plan: Impression is acute on chronic right shoulder pain which has a combination of shoulder OA as well as rotator cuff tear arthropathy.  She does have a moderate to large effusion on the right shoulder, appreciate decision making we did proceed with ultrasound-guided aspiration and subsequent injection.  Patient tolerated well.  She did have improved range of motion  following the procedure.  She will continue formalized physical therapy both for her right and left shoulder to work on rotator cuff stabilization and strengthening.  Her left knee is worse than right knee which does have a way with a degree of valgus collapse, this has improved with pain and stability in her unloader knee brace, she will wear this with activity.  Continue physical therapy for this as well.  She will continue Celebrex  100 mg daily.  In terms of her carpal tunnel, she will continue gabapentin  300 mg a.m., 600 mg p.m. for symptom control, does have upcoming left carpal tunnel release with Dr. Erwin #12.  I will see her back to see how she has responded to all of the above in about 2 months in January when she returns from [place] /[place] .  Follow-up: Return in about 2 months (around 04/20/2024) for shoulder, knee f/u.   Meds & Orders: No orders of the defined types were placed in this encounter.   Orders Placed This Encounter  Procedures   Large Joint Inj   US  Guided Needle Placement - No Linked Charges     Procedures: Large Joint Inj: R glenohumeral on 02/19/2024 10:48 AM Indications: pain and joint swelling Details: 18 G 1.5 in needle, ultrasound-guided lateral approach Medications: 4 mL lidocaine  1 %; 2 mL bupivacaine  0.25 %; 40 mg methylPREDNISolone  acetate 40 MG/ML Aspirate: 36 mL clear and blood-tinged Outcome: tolerated well, no immediate complications  *Procedure really successful ultrasound-guided right shoulder  aspiration and subsequent injection.   Procedure, treatment alternatives, risks and benefits explained, specific risks discussed. Consent was given by the patient. Immediately prior to procedure a time out was called to verify the correct patient, procedure, equipment, support staff and site/side marked as required. Patient was prepped and draped in the usual sterile fashion.            Clinical History: No specialty comments available.  She reports that  she quit smoking about 50 years ago. Her smoking use included cigarettes. She started smoking about 70 years ago. She has never used smokeless tobacco. No results for input(s): HGBA1C, LABURIC in the last 8760 hours.  Objective:    Physical Exam  Gen: Well-appearing, in no acute distress; non-toxic CV: Well-perfused. Warm.  Resp: Breathing unlabored on room air; no wheezing. Psych: Fluid speech in conversation; appropriate affect; normal thought process  Ortho Exam - Right shoulder: There is a moderate to large effusion about the right shoulder which does limit motion.  Forward flexion approximate 155 degrees, abduction 130 degrees.  Mild weakness with drop arm testing, although internal/external is relatively well-preserved.  Imaging:  Narrative & Impression  CLINICAL DATA:  Chronic right shoulder pain   EXAM: MRI OF THE RIGHT SHOULDER WITHOUT CONTRAST   TECHNIQUE: Multiplanar, multisequence MR imaging of the shoulder was performed. No intravenous contrast was administered.   COMPARISON:  None Available.   FINDINGS: Rotator cuff: Complete full-thickness, full width tear of the supraspinatus tendon with 3.4 cm of retraction moderate infraspinatus tendinosis with a full-thickness tear of the anterior half of the tendon. Teres minor tendon is intact. Moderate subscapularis tendinosis.   Muscles: Severe atrophy of the subscapularis muscle. Remainder the rotator cuff muscles demonstrate no focal atrophy.   Biceps Long Head: Complete tear of the long head of the biceps tendon.   Acromioclavicular Joint: Moderate arthropathy of the acromioclavicular joint. Large amount of subacromial/subdeltoid bursal fluid.   Glenohumeral Joint: Large glenohumeral joint effusion with synovitis. Extensive full-thickness cartilage loss of the glenohumeral joint and subchondral marrow edema in the glenoid. Remodeling of the anterior inferior glenoid with overall loss of bone stock. No  chondral defect.   Labrum: Diffuse labral degeneration.   Bones: No fracture or dislocation. No aggressive osseous lesion.   Other: No fluid collection or hematoma.   IMPRESSION: 1. Complete full-thickness, full width tear of the supraspinatus tendon with 3.4 cm of retraction. 2. Moderate infraspinatus tendinosis with a full-thickness tear of the anterior half of the tendon. 3. Moderate subscapularis tendinosis. Severe atrophy of the subscapularis muscle. 4. Complete tear of the long head of the biceps tendon. 5. Severe osteoarthritis of the right glenohumeral joint.     Electronically Signed   By: Julaine Blanch M.D.   On: 03/11/2023 08:44    Past Medical/Family/Surgical/Social History: Medications & Allergies reviewed per EMR, new medications updated. Patient Active Problem List   Diagnosis Date Noted   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   Neuropathy 07/31/2023   Pure hypercholesterolemia 11/14/2019   Gastroesophageal reflux disease 02/04/2019   Lumbar back pain with radiculopathy affecting left lower extremity 04/11/2016   Cervical spondylosis without myelopathy 06/08/2015   Absolute anemia 05/16/2014   Primary osteoarthritis involving multiple joints 05/16/2014   Essential hypertension 09/03/2012   Senile osteoporosis    B12 deficiency due to diet    Past Medical History:  Diagnosis Date   Anemia, unspecified    on meds   B12 deficiency due to diet  Basal cell carcinoma    LEFT UPPER ARM SUP. TX=CX3 5FU   Cataract    bilateral sx   Cramp of limb    Edema of both legs    Encounter for long-term (current) use of other medications    Hypertension    on meds   Lumbago    Melanoma (HCC) 10/18/2010   RIGHT POST SHOULDER =MOHS   Obesity    Osteoarthrosis, unspecified whether generalized or localized, unspecified site    generalized   Restless legs syndrome (RLS)    SCC (squamous cell carcinoma) 07/03/2017   RIGHT CHEST  CX3 5FU   SCC (squamous cell carcinoma) 01/28/2019   LEFT FOREARM CX3 5FU   SCC (squamous cell carcinoma) [id number]   RIGHT FOREHEAD CX3 5FU   SCC (squamous cell carcinoma) 07/03/2017   RIGHT FOREARM CX3 5FU   SCCA (squamous cell carcinoma) of skin 04/08/2020   Right Anterior Mandible (in situ)(CX35FU)   SCCA (squamous cell carcinoma) of skin 04/08/2020   Left Supraorbital Region (Keratoacanthoma)   Senile osteoporosis    Squamous cell carcinoma of skin 01/30/2017   RIGHT JAWLINE TX WITH BX   Family History  Problem Relation Age of Onset   Diabetes Father    Prostate cancer Father    Rectal cancer Sister 67   Colon polyps Sister    Cancer Sister    Diabetes Brother    Prostate cancer Brother    Lung cancer Brother 19   Other Brother        declined after a fall   Stomach cancer Maternal Grandmother    Pancreatic cancer Neg Hx    Colon cancer Neg Hx    Esophageal cancer Neg Hx    Past Surgical History:  Procedure Laterality Date   CATARACT EXTRACTION W/ INTRAOCULAR LENS  IMPLANT, BILATERAL  04/2014   COLONOSCOPY     EYE SURGERY Right 04/28/2016   Macular   LEFT HEART CATH AND CORONARY ANGIOGRAPHY N/A 05/15/2023   Procedure: LEFT HEART CATH AND CORONARY ANGIOGRAPHY;  Surgeon: Ladona Heinz, MD;  Location: [facility];  Service: Cardiovascular;  Laterality: N/A;   SPINE SURGERY  2005   STOMACH SURGERY  1981   stapleing    TONSILLECTOMY     UPPER GASTROINTESTINAL ENDOSCOPY     VITRECTOMY Right    with membrane peeling for a macular pucker   Social History   Occupational History   Occupation: Retired Nurse, Mental Health: [employer]  Tobacco Use   Smoking status: Former    Current packs/day: 0.00    Types: Cigarettes    Start date: 1955    Quit date: 1975    Years since quitting: 50.9   Smokeless tobacco: Never   Tobacco comments:    Quit in early 30's  Vaping Use   Vaping status: Never Used  Substance and Sexual Activity   Alcohol use:  No    Alcohol/week: 0.0 standard drinks of alcohol   Drug use: No   Sexual activity: Never

## 2024-02-19 NOTE — Progress Notes (Signed)
 Patient says that her right shoulder is very painful. She has begun PT, and says that she is able to do all of the exercises with her left arm, but is struggling with her right. She would like to hold on some of the exercises until she has the fluid aspirated from her shoulder. She does have future appointments scheduled in December.

## 2024-02-20 ENCOUNTER — Encounter: Payer: Self-pay | Admitting: Sports Medicine

## 2024-02-20 ENCOUNTER — Encounter: Payer: Self-pay | Admitting: Nurse Practitioner

## 2024-02-20 ENCOUNTER — Other Ambulatory Visit: Payer: Self-pay

## 2024-02-20 DIAGNOSIS — G5602 Carpal tunnel syndrome, left upper limb: Secondary | ICD-10-CM

## 2024-02-21 ENCOUNTER — Other Ambulatory Visit (HOSPITAL_COMMUNITY): Payer: Self-pay | Admitting: Nurse Practitioner

## 2024-02-21 ENCOUNTER — Telehealth (HOSPITAL_COMMUNITY): Payer: Self-pay | Admitting: Pharmacy Technician

## 2024-02-21 NOTE — Telephone Encounter (Signed)
 Auth Submission: NO AUTH NEEDED Site of care: [facility] Payer: MEDICARE A/B, TRICARE Medication & CPT/J Code(s) submitted: Reclast  (Zolendronic acid) J3489 Diagnosis Code: M81.0 Route of submission (phone, fax, portal):  Phone # Fax # Auth type: Buy/Bill HB Units/visits requested: 5mg  x 1 dose, every 12 months Reference number:  Approval from: 02/21/2024 to 04/27/24    Dagoberto Armour, CPhT [facility] Phone: [phone] 02/21/2024

## 2024-02-26 ENCOUNTER — Encounter: Admitting: Rehabilitative and Restorative Service Providers"

## 2024-02-27 ENCOUNTER — Ambulatory Visit (HOSPITAL_COMMUNITY)
Admission: RE | Admit: 2024-02-27 | Discharge: 2024-02-27 | Disposition: A | Source: Ambulatory Visit | Attending: Nurse Practitioner | Admitting: Nurse Practitioner

## 2024-02-27 ENCOUNTER — Encounter

## 2024-02-27 ENCOUNTER — Ambulatory Visit (HOSPITAL_COMMUNITY)
Admission: RE | Admit: 2024-02-27 | Discharge: 2024-02-27 | Disposition: A | Source: Ambulatory Visit | Attending: Nurse Practitioner

## 2024-02-27 DIAGNOSIS — R059 Cough, unspecified: Secondary | ICD-10-CM

## 2024-02-27 DIAGNOSIS — R1310 Dysphagia, unspecified: Secondary | ICD-10-CM

## 2024-02-27 DIAGNOSIS — R1314 Dysphagia, pharyngoesophageal phase: Secondary | ICD-10-CM | POA: Insufficient documentation

## 2024-02-27 NOTE — Progress Notes (Signed)
 Modified Barium Swallow Study  Patient Details  Name: Jody [name] MRN: [id number] Date of Birth: Nov 18, 1940  Today's Date: 02/27/2024  Modified Barium Swallow completed.  Full report located under Chart Review in the Imaging Section.  History of Present Illness Jody [name] is an 83 yo female presenting for an OP MBS. Per recent NP note, she has a long-standing issue with throat swelling and a sensation of blockage when swallowing, particularly with meals (finds relief by lying on her L side). CT Soft Tissue Neck 09/07/21 shows marked gaseous distension of the esophagus from the esophageal verge into the upper chest as well as severe cervical spine degeneration. 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 EGD 01/27/22 shows dilation in the entire esophagus, tortuosity, and a hiatal hernia. Laryngoscopy 08/30/21 showed a normal larynx, vocal cord mobility without ulcer, mass or tumor. Post glottic erythema was noted, possibly secondary to reflux.    PMH: anemia, history of basal cell and squamous cell carcinoma, osteoporosis, HTN   Clinical Impression Pt presents with an overall functional oropharyngeal swallow but primary esophageal dysphagia. She compensates well for diffuse esophageal distension, achieving complete epiglottic inversion and laryngeal closure. The air-filled esophagus and distended PES divert the path of the bolus but pharyngeal peristalsis does effectively clear the pharynx. There is no significant residue or airway invasion. The 13 mm barium tablet was given with thin liquids and followed with purees contributing to the majority of these boluses remaining in the esophagus. Esophageal dilation and tortuosity have been identified on previous esophagram and  endoscopy but per MD note, pt was not complaining of difficulty swallowing at that time. Could consider repeating esophageal assessment now to determine potential to offer relief for pt's symptoms. Otherwise, recommend she continue current diet without ongoing SLP f/u.  Factors that may increase risk of adverse event in presence of aspiration Noe & Lianne 2021): Respiratory or GI disease  Swallow Evaluation Recommendations Recommendations: PO diet PO Diet Recommendation: Regular;Thin liquids (Level 0) Liquid Administration via: Cup;Straw Medication Administration: Whole meds with liquid Supervision: Patient able to self-feed Swallowing strategies  : Minimize environmental distractions;Slow rate;Small bites/sips Postural changes: Position pt fully upright for meals;Stay upright 30-60 min after meals Oral care recommendations: Oral care BID (2x/day) Recommended consults: Consider GI consultation    Damien Blumenthal, M.A., CCC-SLP Speech Language Pathology, Acute Rehabilitation Services  Secure Chat preferred [phone]  02/27/2024,1:18 PM

## 2024-02-27 NOTE — Therapy (Signed)
 OUTPATIENT PHYSICAL THERAPY LOWER EXTREMITY TREATMENT   Patient Name: Jody [name] MRN: [id number] DOB:[birth year], 83 y.o., female Today's Date: 02/29/2024  END OF SESSION:  PT End of Session - 02/29/24 1009     Visit Number 2    Number of Visits 20    Date for Recertification  04/18/24    Authorization Type MEDICARE AND [employer]    Progress Note Due on Visit 10    PT Start Time 1013    PT Stop Time 1057    PT Time Calculation (min) 44 min    Activity Tolerance Patient limited by pain    Behavior During Therapy WFL for tasks assessed/performed           Past Medical History:  Diagnosis Date   Anemia, unspecified    on meds   B12 deficiency due to diet    Basal cell carcinoma    LEFT UPPER ARM SUP. TX=CX3 5FU   Cataract    bilateral sx   Cramp of limb    Edema of both legs    Encounter for long-term (current) use of other medications    Hypertension    on meds   Lumbago    Melanoma (HCC) 10/18/2010   RIGHT POST SHOULDER =MOHS   Obesity    Osteoarthrosis, unspecified whether generalized or localized, unspecified site    generalized   Restless legs syndrome (RLS)    SCC (squamous cell carcinoma) 07/03/2017   RIGHT CHEST CX3 5FU   SCC (squamous cell carcinoma) 01/28/2019   LEFT FOREARM CX3 5FU   SCC (squamous cell carcinoma) [id number]   RIGHT FOREHEAD CX3 5FU   SCC (squamous cell carcinoma) 07/03/2017   RIGHT FOREARM CX3 5FU   SCCA (squamous cell carcinoma) of skin 04/08/2020   Right Anterior Mandible (in situ)(CX35FU)   SCCA (squamous cell carcinoma) of skin 04/08/2020   Left Supraorbital Region (Keratoacanthoma)   Senile osteoporosis    Squamous cell carcinoma of skin 01/30/2017   RIGHT JAWLINE TX WITH BX   Past Surgical History:  Procedure Laterality Date   CATARACT EXTRACTION W/ INTRAOCULAR LENS  IMPLANT, BILATERAL  04/2014   COLONOSCOPY     EYE SURGERY Right 04/28/2016   Macular   LEFT HEART CATH AND CORONARY ANGIOGRAPHY N/A 05/15/2023    Procedure: LEFT HEART CATH AND CORONARY ANGIOGRAPHY;  Surgeon: Ladona Heinz, MD;  Location: [facility];  Service: Cardiovascular;  Laterality: N/A;   SPINE SURGERY  2005   STOMACH SURGERY  1981   stapleing    TONSILLECTOMY     UPPER GASTROINTESTINAL ENDOSCOPY     VITRECTOMY Right    with membrane peeling for a macular pucker   Patient Active Problem List   Diagnosis Date Noted   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   Neuropathy 07/31/2023   Pure hypercholesterolemia 11/14/2019   Gastroesophageal reflux disease 02/04/2019   Lumbar back pain with radiculopathy affecting left lower extremity 04/11/2016   Cervical spondylosis without myelopathy 06/08/2015   Absolute anemia 05/16/2014   Primary osteoarthritis involving multiple joints 05/16/2014   Essential hypertension 09/03/2012   Senile osteoporosis    B12 deficiency due to diet     PCP: Harlene MARLA An, NP  REFERRING PROVIDER: Lonell Sprang, DO  REFERRING DIAG: M17.0 (ICD-10-CM) - Bilateral primary osteoarthritis of knee M19.011,M19.012 (ICD-10-CM) - Primary osteoarthritis of shoulders, bilateral M12.811,M12.812 (ICD-10-CM) - Rotator cuff arthropathy of both shoulders R29.898 (ICD-10-CM) - Leg weakness, bilateral  THERAPY DIAG:  Chronic pain of both knees  Muscle weakness (generalized)  Chronic pain of both shoulders  Other abnormalities of gait and mobility  Abnormal posture  Unsteadiness on feet  Rationale for Evaluation and Treatment: Rehabilitation  ONSET DATE: 01/28/2024 is when it worsened   SUBJECTIVE:   SUBJECTIVE STATEMENT: I have been trying to be consistent with my therapy.   PERTINENT HISTORY: Patient reports that she has chronic bilat knee (Lt>Rt) pain and wears a brace on Lt knee. Patient endorses that she fell and fractured Lt patella in 2017 which is why it still hurts. Patient also endorses one other fall in 2018 out of her attic which led to a  hospitalization and cervical fusion. Patient endorses bilat shoulder pain (Rt>Lt) and requires assistance for reaching with Rt shoulder flexion. Patient recently finished a prednisone  regimen which has improved overall symptoms/pain back to her normal. Patient recently began feeling aching/trembling in Lt LE, has been experiencing generalized muscle atrophy and weakness, and requires aquatic therapy at least 4x/wk in order to continue to be able to get out of bed. Patient endorses one other orthopedic surgery in lower back with breakdown of adjacent vertebrae above and below. Patient has severe osteoporosis.   See PMH or personal factors for more in depth comorbidities  PAIN:  Are you having pain? Yes: NPRS scale: 5/10 at all times, 8/10 when she got out of bed this morning  Pain location: shoulder, knees, LEs in general Pain description: consistent, debilitating Aggravating factors: standing, walking, reaching, functional IR  Relieving factors: aquatic therapy   PRECAUTIONS: Fall and high risk for fractures as she is severely osteoporotic   RED FLAGS: Bowel or bladder incontinence: Yes: referral in for a urologist and Cauda equina syndrome: No   WEIGHT BEARING RESTRICTIONS: No  FALLS:  Has patient fallen in last 6 months? No  LIVING ENVIRONMENT: Lives with: lives with their spouse Lives in: House/apartment Stairs: Yes: External: 1 from carport to inside the house steps; none; steps into the attic with pulldown rail which she goes into often Has following equipment at home: Single point cane, Environmental Consultant - 2 wheeled, Environmental Consultant - 4 wheeled, Marine Scientist  OCCUPATION: retired   PLOF: Independent and uses a Recruitment Consultant  PATIENT GOALS: to get a little stronger, to get out of a chair without making a spectacle   NEXT MD VISIT: 02/19/2024 with Dr. Burnetta   OBJECTIVE:  Note: Objective measures were completed at Evaluation unless otherwise noted.  DIAGNOSTIC FINDINGS:  X-rays  demonstrate severe bone-on-bone collapse of the lateral tibiofemoral joint space. There is notable subchondral sclerosis of the lateral femoral condyle and lateral tibial plateau. The knee is fall into a valgus fashion bilaterally. Lateral view of the left knee shows healed fragmentation of the patella likely from prior fracture. No acute fracture or acute bony abnormality otherwise noted.   PATIENT SURVEYS:  PSFS: THE PATIENT SPECIFIC FUNCTIONAL SCALE  Place score of 0-10 (0 = unable to perform activity and 10 = able to perform activity at the same level as before injury or problem)  Activity Date:  02/08/2024    Walking distance  5    2. Getting up from a chair  5    3. Using arms 3    4.  Balance and mobility  5    5. Walking with a purpose 3    Total Score 4.2      Total Score = Sum of activity scores/number of activities  Minimally Detectable Change: 3 points (  for single activity); 2 points (for average score)  [facility] (nd). The Patient Specific Functional Scale . Retrieved from [email or web address]   COGNITION: Overall cognitive status: Within functional limits for tasks assessed     SENSATION: Light touch: WFL Patient endorses highly sensitive to touch on bilat LE   POSTURE: rounded shoulders, forward head, and increased thoracic kyphosis   LOWER EXTREMITY ROM:  Active ROM Right Eval 02/08/2024 Left Eval 02/08/2024  Hip flexion    Hip extension    Hip abduction    Hip adduction    Hip internal rotation    Hip external rotation    Knee flexion (seated) [facility] WFL  Knee extension (seated) WFL ~90% WFL  Ankle dorsiflexion    Ankle plantarflexion    Ankle inversion    Ankle eversion     (Blank rows = not tested)  UE ROM: 02/08/2024, seated AROM  Shoulder flexion: Lt: WFL Rt: 69deg Shoulder abduction: Lt: WFL, but painful around 90deg Rt: 60deg Shoulder functional IR: Lt: T12 Rt: iliac crest    Shoulder functional ER: Lt: T2 Rt: UT  LOWER EXTREMITY MMT: painful with MMT   MMT Right Eval 02/08/2024 Left Eval 02/08/2024  Hip flexion (seated) 3+/5 3+/5  Hip extension    Hip abduction    Hip adduction    Hip internal rotation    Hip external rotation    Knee flexion (seated) 4-/5 4-/5  Knee extension (seated) 4/5 4-/5  Ankle dorsiflexion     Ankle plantarflexion    Ankle inversion    Ankle eversion     (Blank rows = not tested)   FUNCTIONAL TESTS:  5 times sit to stand: 13s from arm chair using bilat UEs after first stand    Slump 02/08/2024: positive on Rt side   GAIT: Distance walked: not objectively measured  Assistive device utilized: SPC/walking stick Level of assistance: supervision  Comments: antalgic and decreased speed                                                                                                                                TREATMENT DATE:  02/29/2024 TherEx:  Nustep level 3 for 8 minutes with bilat UE and LE  Seated LAQ with 1.5# weights 2x12   Standing hamstring curls with UE support for appropriate form 1x6 each side; PT discussing importance of weight shifting onto opposite leg   TherAct:  Reaching to cabinet with 1# weight 2x6 each side ; increased difficulty on Rt side  Supine shoulder flexion AAROM with PVC 1x10    02/08/2024 TherEx:  HEP handout provided with patient performing one set of each activity for appropriate form. Demonstration, verbal and tactile cues provided.   Self-Care: POC, aquatic physical therapy (with handout provided), sciatica    PATIENT EDUCATION:  Education details: HEP, POC, aquatic PT, sciatica  Person educated: Patient Education method: Explanation, Demonstration, Tactile cues, Verbal cues, and Handouts Education comprehension: verbalized  understanding, returned demonstration, verbal cues required, tactile cues required, and needs further education  HOME EXERCISE PROGRAM: Access  Code: [email or web address] URL: [email or web address] Date: 02/08/2024 Prepared by: Susannah Daring  Exercises - Mini Squat with Counter Support  - 1 x daily - 7 x weekly - 2 sets - 5 reps - Seated Long Arc Quad  - 1 x daily - 7 x weekly - 2 sets - 10 reps - 2seconds hold - Standing Knee Flexion AROM with Chair Support  - 1 x daily - 7 x weekly - 2 sets - 10 reps - 2seconds  hold - AAROM/AROM Shoulder Flexion at Cabinet  - 1 x daily - 7 x weekly - 2 sets - 10 reps  ASSESSMENT:  CLINICAL IMPRESSION: Patient arrived to session noting improvement in shoulder mobility but no real differences in pain levels. Patient tolerated all activities this days with no increase in pain. Patient will continue to benefit from skilled PT.   OBJECTIVE IMPAIRMENTS: Abnormal gait, decreased activity tolerance, decreased balance, decreased coordination, decreased endurance, decreased mobility, difficulty walking, decreased ROM, decreased strength, improper body mechanics, postural dysfunction, and pain.   ACTIVITY LIMITATIONS: carrying, lifting, bending, sitting, standing, squatting, stairs, transfers, continence, dressing, and reach over head  PARTICIPATION LIMITATIONS: cleaning, community activity, and yard work  PERSONAL FACTORS: Age, Behavior pattern, Past/current experiences, Time since onset of injury/illness/exacerbation, and 3+ comorbidities: anemia, osteoporosis, history of cancer, cataracts, HTN, prior falls are also affecting patient's functional outcome.   REHAB POTENTIAL: Fair lacks compliance, high pain levels, multiple comorbidities   CLINICAL DECISION MAKING: Evolving/moderate complexity  EVALUATION COMPLEXITY: Moderate   GOALS: Goals reviewed with patient? Yes  SHORT TERM GOALS: Target date: 03/07/2024 Patient will show compliance with initial HEP. Baseline: Goal status: GOAL MET, 02/29/2024  2.  Patient will report pain levels no greater than 6/10 in order to show an improvement in  overall quality of life. Baseline:  Goal status: goal ongoing, 02/29/2024   LONG TERM GOALS: Target date: 04/18/2024  Patient will be independent with final HEP in order to maintain and progress upon functional gains made within PT. Baseline:  Goal status: INITIAL  2.  Patient will report pain levels no greater than 5/10 in order to show an improvement in overall quality of life. Baseline:  Goal status: INITIAL  3.  Patient will increase PSFS to at least 6.2 in order to show a significant improvement in subjective disability. Baseline:  Goal status: INITIAL  4.  Patient will perform 5xSTS to at least 12s in order to decrease risk of falls.  Baseline:  Goal status: INITIAL  5.  Patient will improve Rt shoulder flexion and abduction to at least 90deg to improve overall functional mobility.  Baseline:  Goal status: INITIAL  6.  Patient will increase bilat hip flexion strength to at least 4/5 in order to improve biomechanics with functional mobility. Baseline:  Goal status: INITIAL   PLAN:  PT FREQUENCY: 1-2x/week  PT DURATION: 10 weeks  PLANNED INTERVENTIONS: 97164- PT Re-evaluation, 97750- Physical Performance Testing, 97110-Therapeutic exercises, 97530- Therapeutic activity, [id number]- Neuromuscular re-education, 97535- Self Care, 02859- Manual therapy, [id number]- Gait training, [phone]- Orthotic Initial, [phone]- Orthotic/Prosthetic subsequent, [phone]- Canalith repositioning, [phone]- Aquatic Therapy, H9716- Electrical stimulation (unattended), [phone]- Electrical stimulation (manual), [id number]- Vasopneumatic device, [id number]- Ultrasound, [id number]- Ionotophoresis 4mg /ml Dexamethasone , 79439 (1-2 muscles), 20561 (3+ muscles)- Dry Needling, Patient/Family education, Balance training, Stair training, Taping, Joint mobilization, Spinal mobilization, Vestibular training, DME instructions, Cryotherapy, and Moist heat  PLAN FOR NEXT SESSION: UE strength and  mobility, LE strength and mobility, weightbearing  activities, balance assessment, TUG    Susannah Daring, PT, DPT 02/29/24 1:28 PM

## 2024-02-28 ENCOUNTER — Encounter

## 2024-02-28 ENCOUNTER — Ambulatory Visit: Payer: Self-pay | Admitting: Nurse Practitioner

## 2024-02-29 ENCOUNTER — Ambulatory Visit

## 2024-02-29 DIAGNOSIS — R2689 Other abnormalities of gait and mobility: Secondary | ICD-10-CM | POA: Diagnosis not present

## 2024-02-29 DIAGNOSIS — R293 Abnormal posture: Secondary | ICD-10-CM

## 2024-02-29 DIAGNOSIS — M6281 Muscle weakness (generalized): Secondary | ICD-10-CM | POA: Diagnosis not present

## 2024-02-29 DIAGNOSIS — M25511 Pain in right shoulder: Secondary | ICD-10-CM

## 2024-02-29 DIAGNOSIS — M25562 Pain in left knee: Secondary | ICD-10-CM

## 2024-02-29 DIAGNOSIS — G8929 Other chronic pain: Secondary | ICD-10-CM

## 2024-02-29 DIAGNOSIS — M25561 Pain in right knee: Secondary | ICD-10-CM | POA: Diagnosis not present

## 2024-02-29 DIAGNOSIS — M25512 Pain in left shoulder: Secondary | ICD-10-CM

## 2024-02-29 DIAGNOSIS — R2681 Unsteadiness on feet: Secondary | ICD-10-CM

## 2024-03-01 ENCOUNTER — Encounter

## 2024-03-04 ENCOUNTER — Inpatient Hospital Stay (HOSPITAL_COMMUNITY)
Admission: RE | Admit: 2024-03-04 | Discharge: 2024-03-04 | Disposition: A | Source: Ambulatory Visit | Attending: Nurse Practitioner

## 2024-03-04 VITALS — BP 110/56 | HR 67 | Temp 98.2°F | Resp 16

## 2024-03-04 DIAGNOSIS — M810 Age-related osteoporosis without current pathological fracture: Secondary | ICD-10-CM

## 2024-03-04 MED ORDER — ZOLEDRONIC ACID 5 MG/100ML IV SOLN
INTRAVENOUS | Status: AC
  Filled 2024-03-04: qty 100

## 2024-03-04 MED ORDER — ACETAMINOPHEN 325 MG PO TABS: 650.0000 mg | ORAL_TABLET | Freq: Once | ORAL | Status: DC

## 2024-03-04 MED ORDER — ZOLEDRONIC ACID 5 MG/100ML IV SOLN
5.0000 mg | Freq: Once | INTRAVENOUS | Status: AC
  Administered 2024-03-04: 5 mg via INTRAVENOUS

## 2024-03-04 MED ORDER — SODIUM CHLORIDE 0.9 % IV SOLN: INTRAVENOUS | Status: DC

## 2024-03-04 MED ORDER — DIPHENHYDRAMINE HCL 25 MG PO CAPS: 25.0000 mg | ORAL_CAPSULE | Freq: Once | ORAL | Status: DC

## 2024-03-04 NOTE — Therapy (Signed)
 OUTPATIENT PHYSICAL THERAPY LOWER EXTREMITY TREATMENT   Patient Name: Jody [name] MRN: [id number] DOB:1940/06/27, 83 y.o., female Today's Date: 03/05/2024  END OF SESSION:  PT End of Session - 03/05/24 1010     Visit Number 3    Number of Visits 20    Date for Recertification  04/18/24    Authorization Type MEDICARE AND [employer]    Progress Note Due on Visit 10    PT Start Time 1015    PT Stop Time 1055    PT Time Calculation (min) 40 min    Activity Tolerance Patient tolerated treatment well    Behavior During Therapy WFL for tasks assessed/performed            Past Medical History:  Diagnosis Date   Anemia, unspecified    on meds   B12 deficiency due to diet    Basal cell carcinoma    LEFT UPPER ARM SUP. TX=CX3 5FU   Cataract    bilateral sx   Cramp of limb    Edema of both legs    Encounter for long-term (current) use of other medications    Hypertension    on meds   Lumbago    Melanoma (HCC) 10/18/2010   RIGHT POST SHOULDER =MOHS   Obesity    Osteoarthrosis, unspecified whether generalized or localized, unspecified site    generalized   Restless legs syndrome (RLS)    SCC (squamous cell carcinoma) 07/03/2017   RIGHT CHEST CX3 5FU   SCC (squamous cell carcinoma) 01/28/2019   LEFT FOREARM CX3 5FU   SCC (squamous cell carcinoma) [id number]   RIGHT FOREHEAD CX3 5FU   SCC (squamous cell carcinoma) 07/03/2017   RIGHT FOREARM CX3 5FU   SCCA (squamous cell carcinoma) of skin 04/08/2020   Right Anterior Mandible (in situ)(CX35FU)   SCCA (squamous cell carcinoma) of skin 04/08/2020   Left Supraorbital Region (Keratoacanthoma)   Senile osteoporosis    Squamous cell carcinoma of skin 01/30/2017   RIGHT JAWLINE TX WITH BX   Past Surgical History:  Procedure Laterality Date   CATARACT EXTRACTION W/ INTRAOCULAR LENS  IMPLANT, BILATERAL  04/2014   COLONOSCOPY     EYE SURGERY Right 04/28/2016   Macular   LEFT HEART CATH AND CORONARY ANGIOGRAPHY N/A  05/15/2023   Procedure: LEFT HEART CATH AND CORONARY ANGIOGRAPHY;  Surgeon: Ladona Heinz, MD;  Location: [facility];  Service: Cardiovascular;  Laterality: N/A;   SPINE SURGERY  2005   STOMACH SURGERY  1981   stapleing    TONSILLECTOMY     UPPER GASTROINTESTINAL ENDOSCOPY     VITRECTOMY Right    with membrane peeling for a macular pucker   Patient Active Problem List   Diagnosis Date Noted   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   Neuropathy 07/31/2023   Pure hypercholesterolemia 11/14/2019   Gastroesophageal reflux disease 02/04/2019   Lumbar back pain with radiculopathy affecting left lower extremity 04/11/2016   Cervical spondylosis without myelopathy 06/08/2015   Absolute anemia 05/16/2014   Primary osteoarthritis involving multiple joints 05/16/2014   Essential hypertension 09/03/2012   Senile osteoporosis    B12 deficiency due to diet     PCP: Harlene MARLA An, NP  REFERRING PROVIDER: Lonell Sprang, DO  REFERRING DIAG: M17.0 (ICD-10-CM) - Bilateral primary osteoarthritis of knee M19.011,M19.012 (ICD-10-CM) - Primary osteoarthritis of shoulders, bilateral M12.811,M12.812 (ICD-10-CM) - Rotator cuff arthropathy of both shoulders R29.898 (ICD-10-CM) - Leg weakness, bilateral  THERAPY DIAG:  Chronic pain of both knees  Muscle weakness (generalized)  Chronic pain of both shoulders  Other abnormalities of gait and mobility  Abnormal posture  Unsteadiness on feet  Rationale for Evaluation and Treatment: Rehabilitation  ONSET DATE: 01/28/2024 is when it worsened   SUBJECTIVE:   SUBJECTIVE STATEMENT: It feels like it is a little worse today. Patient endorses that her knees are always hurting.  PERTINENT HISTORY: Patient reports that she has chronic bilat knee (Lt>Rt) pain and wears a brace on Lt knee. Patient endorses that she fell and fractured Lt patella in 2017 which is why it still hurts. Patient also endorses one other  fall in 2018 out of her attic which led to a hospitalization and cervical fusion. Patient endorses bilat shoulder pain (Rt>Lt) and requires assistance for reaching with Rt shoulder flexion. Patient recently finished a prednisone  regimen which has improved overall symptoms/pain back to her normal. Patient recently began feeling aching/trembling in Lt LE, has been experiencing generalized muscle atrophy and weakness, and requires aquatic therapy at least 4x/wk in order to continue to be able to get out of bed. Patient endorses one other orthopedic surgery in lower back with breakdown of adjacent vertebrae above and below. Patient has severe osteoporosis.   See PMH or personal factors for more in depth comorbidities  PAIN:  Are you having pain? Yes: NPRS scale: not rated this session  Pain location: shoulder, knees, LEs in general Pain description: consistent, debilitating Aggravating factors: standing, walking, reaching, functional IR  Relieving factors: aquatic therapy   PRECAUTIONS: Fall and high risk for fractures as she is severely osteoporotic   RED FLAGS: Bowel or bladder incontinence: Yes: referral in for a urologist and Cauda equina syndrome: No   WEIGHT BEARING RESTRICTIONS: No  FALLS:  Has patient fallen in last 6 months? No  LIVING ENVIRONMENT: Lives with: lives with their spouse Lives in: House/apartment Stairs: Yes: External: 1 from carport to inside the house steps; none; steps into the attic with pulldown rail which she goes into often Has following equipment at home: Single point cane, Environmental Consultant - 2 wheeled, Environmental Consultant - 4 wheeled, Marine Scientist  OCCUPATION: retired   PLOF: Independent and uses a Recruitment Consultant  PATIENT GOALS: to get a little stronger, to get out of a chair without making a spectacle   NEXT MD VISIT: 02/19/2024 with Dr. Burnetta   OBJECTIVE:  Note: Objective measures were completed at Evaluation unless otherwise noted.  DIAGNOSTIC FINDINGS:  X-rays  demonstrate severe bone-on-bone collapse of the lateral tibiofemoral joint space. There is notable subchondral sclerosis of the lateral femoral condyle and lateral tibial plateau. The knee is fall into a valgus fashion bilaterally. Lateral view of the left knee shows healed fragmentation of the patella likely from prior fracture. No acute fracture or acute bony abnormality otherwise noted.   PATIENT SURVEYS:  PSFS: THE PATIENT SPECIFIC FUNCTIONAL SCALE  Place score of 0-10 (0 = unable to perform activity and 10 = able to perform activity at the same level as before injury or problem)  Activity Date:  02/08/2024    Walking distance  5    2. Getting up from a chair  5    3. Using arms 3    4.  Balance and mobility  5    5. Walking with a purpose 3    Total Score 4.2      Total Score = Sum of activity scores/number of activities  Minimally Detectable Change: 3 points (for single activity);  2 points (for average score)  [facility] (nd). The Patient Specific Functional Scale . Retrieved from [email or web address]   COGNITION: Overall cognitive status: Within functional limits for tasks assessed     SENSATION: Light touch: WFL Patient endorses highly sensitive to touch on bilat LE   POSTURE: rounded shoulders, forward head, and increased thoracic kyphosis   LOWER EXTREMITY ROM:  Active ROM Right Eval 02/08/2024 Left Eval 02/08/2024  Hip flexion    Hip extension    Hip abduction    Hip adduction    Hip internal rotation    Hip external rotation    Knee flexion (seated) [facility] WFL  Knee extension (seated) WFL ~90% WFL  Ankle dorsiflexion    Ankle plantarflexion    Ankle inversion    Ankle eversion     (Blank rows = not tested)  UE ROM: 02/08/2024, seated AROM  Shoulder flexion: Lt: WFL Rt: 69deg Shoulder abduction: Lt: WFL, but painful around 90deg Rt: 60deg Shoulder functional IR: Lt: T12 Rt: iliac crest    Shoulder functional ER: Lt: T2 Rt: UT  LOWER EXTREMITY MMT: painful with MMT   MMT Right Eval 02/08/2024 Left Eval 02/08/2024  Hip flexion (seated) 3+/5 3+/5  Hip extension    Hip abduction    Hip adduction    Hip internal rotation    Hip external rotation    Knee flexion (seated) 4-/5 4-/5  Knee extension (seated) 4/5 4-/5  Ankle dorsiflexion     Ankle plantarflexion    Ankle inversion    Ankle eversion     (Blank rows = not tested)   FUNCTIONAL TESTS:  5 times sit to stand: 13s from arm chair using bilat UEs after first stand    Slump 02/08/2024: positive on Rt side   GAIT: Distance walked: not objectively measured  Assistive device utilized: SPC/walking stick Level of assistance: supervision  Comments: antalgic and decreased speed                                                                                                                                TREATMENT DATE:  03/05/2024 TherEx: Nustep level 4 for 8 minutes with bilat LE only  Seated shoulder flexion with 2# bar 2x12  Seated shoulder abduction 1x20  Seated ER with scap retraction 1x20  Calf raises 1x20   TherAct:  Sit<>stands with 5# KB 3x5 (mat table at 19.5)  Step ups with 4 step 1x8 leading with each foot; intermittent UE use for stability purposes   02/29/2024 TherEx:  Nustep level 3 for 8 minutes with bilat UE and LE  Seated LAQ with 1.5# weights 2x12   Standing hamstring curls with UE support for appropriate form 1x6 each side; PT discussing importance of weight shifting onto opposite leg   TherAct:  Reaching to cabinet with 1# weight 2x6 each side ; increased difficulty on Rt side  Supine shoulder flexion AAROM with PVC 1x10  02/08/2024 TherEx:  HEP handout provided with patient performing one set of each activity for appropriate form. Demonstration, verbal and tactile cues provided.   Self-Care: POC, aquatic physical therapy (with handout provided), sciatica    PATIENT  EDUCATION:  Education details: HEP, POC, aquatic PT, sciatica  Person educated: Patient Education method: Explanation, Demonstration, Tactile cues, Verbal cues, and Handouts Education comprehension: verbalized understanding, returned demonstration, verbal cues required, tactile cues required, and needs further education  HOME EXERCISE PROGRAM: Access Code: [email or web address] URL: [email or web address] Date: 02/08/2024 Prepared by: Susannah Daring  Exercises - Mini Squat with Counter Support  - 1 x daily - 7 x weekly - 2 sets - 5 reps - Seated Long Arc Quad  - 1 x daily - 7 x weekly - 2 sets - 10 reps - 2seconds hold - Standing Knee Flexion AROM with Chair Support  - 1 x daily - 7 x weekly - 2 sets - 10 reps - 2seconds  hold - AAROM/AROM Shoulder Flexion at Cabinet  - 1 x daily - 7 x weekly - 2 sets - 10 reps  ASSESSMENT:  CLINICAL IMPRESSION:  Patient arrived to session noting slightly increased pain compared to last session, but has been dealing with it as it is all chronic. Patient tolerated all activities this date. Patient will continue to benefit from skilled PT.   OBJECTIVE IMPAIRMENTS: Abnormal gait, decreased activity tolerance, decreased balance, decreased coordination, decreased endurance, decreased mobility, difficulty walking, decreased ROM, decreased strength, improper body mechanics, postural dysfunction, and pain.   ACTIVITY LIMITATIONS: carrying, lifting, bending, sitting, standing, squatting, stairs, transfers, continence, dressing, and reach over head  PARTICIPATION LIMITATIONS: cleaning, community activity, and yard work  PERSONAL FACTORS: Age, Behavior pattern, Past/current experiences, Time since onset of injury/illness/exacerbation, and 3+ comorbidities: anemia, osteoporosis, history of cancer, cataracts, HTN, prior falls are also affecting patient's functional outcome.   REHAB POTENTIAL: Fair lacks compliance, high pain levels, multiple comorbidities    CLINICAL DECISION MAKING: Evolving/moderate complexity  EVALUATION COMPLEXITY: Moderate   GOALS: Goals reviewed with patient? Yes  SHORT TERM GOALS: Target date: 03/07/2024 Patient will show compliance with initial HEP. Baseline: Goal status: GOAL MET, 02/29/2024  2.  Patient will report pain levels no greater than 6/10 in order to show an improvement in overall quality of life. Baseline:  Goal status: goal ongoing, 02/29/2024   LONG TERM GOALS: Target date: 04/18/2024  Patient will be independent with final HEP in order to maintain and progress upon functional gains made within PT. Baseline:  Goal status: INITIAL  2.  Patient will report pain levels no greater than 5/10 in order to show an improvement in overall quality of life. Baseline:  Goal status: INITIAL  3.  Patient will increase PSFS to at least 6.2 in order to show a significant improvement in subjective disability. Baseline:  Goal status: INITIAL  4.  Patient will perform 5xSTS to at least 12s in order to decrease risk of falls.  Baseline:  Goal status: INITIAL  5.  Patient will improve Rt shoulder flexion and abduction to at least 90deg to improve overall functional mobility.  Baseline:  Goal status: INITIAL  6.  Patient will increase bilat hip flexion strength to at least 4/5 in order to improve biomechanics with functional mobility. Baseline:  Goal status: INITIAL   PLAN:  PT FREQUENCY: 1-2x/week  PT DURATION: 10 weeks  PLANNED INTERVENTIONS: 97164- PT Re-evaluation, 97750- Physical Performance Testing, 97110-Therapeutic exercises, 97530- Therapeutic activity, [id number]- Neuromuscular re-education, 97535- Self Care, 02859- Manual therapy,  02883- Gait training, 02239- Orthotic Initial, [id number]- Orthotic/Prosthetic subsequent, [phone]- Canalith repositioning, 02886- Aquatic Therapy, [phone]- Electrical stimulation (unattended), [phone]- Electrical stimulation (manual), [id number]- Vasopneumatic device, [id number]-  Ultrasound, [id number]- Ionotophoresis 4mg /ml Dexamethasone , 79439 (1-2 muscles), 20561 (3+ muscles)- Dry Needling, Patient/Family education, Balance training, Stair training, Taping, Joint mobilization, Spinal mobilization, Vestibular training, DME instructions, Cryotherapy, and Moist heat  PLAN FOR NEXT SESSION:  UE strength and mobility, LE strength and mobility, weightbearing activities, balance assessment, TUG    Susannah Daring, PT, DPT 03/05/24 11:02 AM

## 2024-03-05 ENCOUNTER — Ambulatory Visit

## 2024-03-05 ENCOUNTER — Encounter (HOSPITAL_BASED_OUTPATIENT_CLINIC_OR_DEPARTMENT_OTHER): Payer: Self-pay | Admitting: Orthopedic Surgery

## 2024-03-05 ENCOUNTER — Other Ambulatory Visit: Payer: Self-pay

## 2024-03-05 DIAGNOSIS — G8929 Other chronic pain: Secondary | ICD-10-CM

## 2024-03-05 DIAGNOSIS — R2681 Unsteadiness on feet: Secondary | ICD-10-CM

## 2024-03-05 DIAGNOSIS — R2689 Other abnormalities of gait and mobility: Secondary | ICD-10-CM

## 2024-03-05 DIAGNOSIS — M6281 Muscle weakness (generalized): Secondary | ICD-10-CM

## 2024-03-05 DIAGNOSIS — R293 Abnormal posture: Secondary | ICD-10-CM

## 2024-03-07 ENCOUNTER — Encounter: Payer: Self-pay | Admitting: Gastroenterology

## 2024-03-07 ENCOUNTER — Encounter

## 2024-03-07 ENCOUNTER — Ambulatory Visit: Admitting: Gastroenterology

## 2024-03-07 VITALS — BP 126/70 | HR 82 | Ht 60.0 in | Wt 120.4 lb

## 2024-03-07 DIAGNOSIS — R933 Abnormal findings on diagnostic imaging of other parts of digestive tract: Secondary | ICD-10-CM

## 2024-03-07 DIAGNOSIS — R1319 Other dysphagia: Secondary | ICD-10-CM | POA: Diagnosis not present

## 2024-03-07 NOTE — H&P (View-Only) (Signed)
 [facility] Consult Note:  History: Jody [name] 03/07/2024  Referring provider: Caro Harlene POUR, NP  Reason for consult/chief complaint: barrium swallow  (Pt states she feels fine, and wants to know the results of swallow test )   Subjective  Prior history:  November 2023 office visit and subsequent EGD for evaluation of abnormal esophageal/gastric findings on barium esophagram and CT chest as well as right sided neck pain that had also been evaluated by ENT. EGD November 2023 showed dilatation of the esophageal lumen with a hiatal hernia and marked tortuosity and foreshortening of the distal esophagus (due to the hernia) as well as gastric changes of a distant bariatric procedure (?  Vertical banded gastroplasty) No medical or surgical therapy could be offered  This patient is referred back to us  now by primary care for dysphagia and after findings on a modified barium study with their evaluation noted below   History of Present Illness   Jody [name] was here today for dysphagia and with questions related to her recent swallow study.  She was referred back to us  for dysphagia that she describes in 2 ways.  Since I last saw her, she has developed a feeling of fullness in the throat, particular when swallowing large tablets such as calcium.  She also has a feeling of food sitting in the lower chest at times, which she still relieved by laying down for about 30 to 45 minutes.  She has not had coughing while eating, and her MBS did not show evidence of penetration or aspiration.  She still has the marked imaging abnormalities of the esophagus and upper stomach. Appetite generally good and she believes she is maintaining her weight.  No vomiting or rectal bleeding.  ROS:  Review of Systems Denies chest pain or cough Still has some right sided neck discomfort that has been present for many years, unchanged  Past Medical History: Past Medical History:  Diagnosis  Date   Anemia, unspecified    on meds   B12 deficiency due to diet    Basal cell carcinoma    LEFT UPPER ARM SUP. TX=CX3 5FU   Cataract    bilateral sx   Cramp of limb    Edema of both legs    Encounter for long-term (current) use of other medications    Hypertension    on meds   Lumbago    Melanoma (HCC) 10/18/2010   RIGHT POST SHOULDER =MOHS   Obesity    Osteoarthrosis, unspecified whether generalized or localized, unspecified site    generalized   Restless legs syndrome (RLS)    SCC (squamous cell carcinoma) 07/03/2017   RIGHT CHEST CX3 5FU   SCC (squamous cell carcinoma) 01/28/2019   LEFT FOREARM CX3 5FU   SCC (squamous cell carcinoma) [id number]   RIGHT FOREHEAD CX3 5FU   SCC (squamous cell carcinoma) 07/03/2017   RIGHT FOREARM CX3 5FU   SCCA (squamous cell carcinoma) of skin 04/08/2020   Right Anterior Mandible (in situ)(CX35FU)   SCCA (squamous cell carcinoma) of skin 04/08/2020   Left Supraorbital Region (Keratoacanthoma)   Senile osteoporosis    Squamous cell carcinoma of skin 01/30/2017   RIGHT JAWLINE TX WITH BX     Past Surgical History: Past Surgical History:  Procedure Laterality Date   CATARACT EXTRACTION W/ INTRAOCULAR LENS  IMPLANT, BILATERAL  04/2014   COLONOSCOPY     EYE SURGERY Right 04/28/2016   Macular   LEFT HEART CATH AND CORONARY ANGIOGRAPHY N/A 05/15/2023  Procedure: LEFT HEART CATH AND CORONARY ANGIOGRAPHY;  Surgeon: Ladona Heinz, MD;  Location: [facility];  Service: Cardiovascular;  Laterality: N/A;   SPINE SURGERY  2005   STOMACH SURGERY  1981   stapleing    TONSILLECTOMY     UPPER GASTROINTESTINAL ENDOSCOPY     VITRECTOMY Right    with membrane peeling for a macular pucker     Family History: Family History  Problem Relation Age of Onset   Diabetes Father    Prostate cancer Father    Rectal cancer Sister 54   Colon polyps Sister    Cancer Sister    Diabetes Brother    Prostate cancer Brother    Lung cancer Brother  28   Other Brother        declined after a fall   Stomach cancer Maternal Grandmother    Pancreatic cancer Neg Hx    Colon cancer Neg Hx    Esophageal cancer Neg Hx     Social History: Social History   Socioeconomic History   Marital status: Married    Spouse name: Not on file   Number of children: Not on file   Years of education: Not on file   Highest education level: Master's degree (e.g., MA, MS, MEng, MEd, MSW, MBA)  Occupational History   Occupation: Retired Nurse, Mental Health: [employer]  Tobacco Use   Smoking status: Former    Current packs/day: 0.00    Types: Cigarettes    Start date: 1955    Quit date: 1975    Years since quitting: 50.9   Smokeless tobacco: Never   Tobacco comments:    Quit in early 30's  Vaping Use   Vaping status: Never Used  Substance and Sexual Activity   Alcohol use: No    Alcohol/week: 0.0 standard drinks of alcohol   Drug use: No   Sexual activity: Not Currently    Birth control/protection: Post-menopausal  Other Topics Concern   Not on file  Social History Narrative   Married -Nancyann married 1966   Former Smoker stopped 1977   Alcohol none   Exercise -Deep water 4 times a week for one hour   POA   Are you right handed or left handed? right   Are you currently employed ?    What is your current occupation?   Do you live at home alone?   Who lives with you   What type of home do you live in: 1 story or 2 story? one       Social Drivers of Health   Tobacco Use: Medium Risk (03/05/2024)   Patient History    Smoking Tobacco Use: Former    Smokeless Tobacco Use: Never    Passive Exposure: Not on file  Financial Resource Strain: Patient Declined (01/30/2024)   Overall Financial Resource Strain (CARDIA)    Difficulty of Paying Living Expenses: Patient declined  Food Insecurity: Patient Declined (01/30/2024)   Epic    Worried About Programme Researcher, Broadcasting/film/video in the Last Year: Patient declined    Barista in  the Last Year: Patient declined  Transportation Needs: No Transportation Needs (01/30/2024)   Epic    Lack of Transportation (Medical): No    Lack of Transportation (Non-Medical): No  Physical Activity: Sufficiently Active (01/30/2024)   Exercise Vital Sign    Days of Exercise per Week: 5 days    Minutes of Exercise per Session: 60 min  Stress: Stress Concern Present (01/30/2024)   [employer] of Occupational Health - Occupational Stress Questionnaire    Feeling of Stress: To some extent  Social Connections: Unknown (01/30/2024)   Social Connection and Isolation Panel    Frequency of Communication with Friends and Family: Patient declined    Frequency of Social Gatherings with Friends and Family: Patient declined    Attends Religious Services: Patient declined    Database Administrator or Organizations: Patient declined    Attends Banker Meetings: Not on file    Marital Status: Married  Depression (PHQ2-9): Low Risk (02/02/2024)   Depression (PHQ2-9)    PHQ-2 Score: 0  Alcohol Screen: Not on file  Housing: Patient Declined (01/30/2024)   Epic    Unable to Pay for Housing in the Last Year: Patient declined    Number of Times Moved in the Last Year: Not on file    Homeless in the Last Year: Patient declined  Utilities: Not At Risk (10/04/2023)   Epic    Threatened with loss of utilities: No  Health Literacy: Not on file    Allergies: Allergies[1]  Outpatient Meds: Current Outpatient Medications  Medication Sig Dispense Refill   Calcium Carbonate-Vit D-Min (CALCIUM 1200 PO) Take 1 tablet by mouth in the morning. (Patient taking differently: Take 2 tablets by mouth in the morning.)     celecoxib  (CELEBREX ) 100 MG capsule 1 tablet by mouth daily, will take additional tablet if needed 180 capsule 3   diclofenac Sodium (VOLTAREN ARTHRITIS PAIN) 1 % GEL Apply 1 Application topically 4 (four) times daily as needed (pain.).     gabapentin  (NEURONTIN ) 300 MG capsule Take  1 capsule (300 mg total) by mouth 3 (three) times daily. 270 capsule 3   lidocaine  (LIDODERM ) 5 % Place 1 patch onto the skin daily as needed (pain.). Remove & Discard patch within 12 hours or as directed by MD 90 patch 3   lisinopril  (ZESTRIL ) 5 MG tablet Take 1 tablet (5 mg total) by mouth daily. 90 tablet 3   Multiple Vitamin (MULTIVITAMIN WITH MINERALS) TABS tablet Take 1 tablet by mouth in the morning.     nitroGLYCERIN  (NITROSTAT ) 0.4 MG SL tablet DISSOLVE 1 TABLET UNDER THE TONGUE EVERY 5 MINUTES AS NEEDED FOR CHEST PAIN 25 tablet 3   olopatadine (PATADAY) 0.1 % ophthalmic solution Place 1 drop into both eyes daily.     Probiotic Product (ALIGN) 10 MG CAPS Take 1 tablet by mouth daily. (Patient taking differently: Take 1 tablet by mouth daily. Every other day)     Zoledronic  Acid (RECLAST  IV) as directed. Once yearly, last infusion was November 2024     No current facility-administered medications for this visit.      ___________________________________________________________________ Objective   Exam:  BP 126/70   Pulse 82   Ht 5' (1.524 m)   Wt 120 lb 6 oz (54.6 kg)   BMI 23.51 kg/m  Wt Readings from Last 3 Encounters:  03/07/24 120 lb 6 oz (54.6 kg)  02/02/24 119 lb 12.8 oz (54.3 kg)  10/04/23 117 lb 12.8 oz (53.4 kg)   Note weight stable from months ago General: Well-appearing, normal vocal quality, no muscle wasting.  Thin and petite.  Ambulatory Eyes: sclera anicteric, no redness ENT: oral mucosa moist without lesions, no cervical or supraclavicular lymphadenopathy CV: Regular without appreciable murmur, no JVD, no peripheral edema Resp: clear to auscultation bilaterally, normal RR and effort noted GI: soft, no tenderness, with active bowel  sounds. No guarding or palpable organomegaly noted.    Data:  Modified barium study history and findings:  HPI: Madeleine Fenn is an 83 yo female presenting for an OP MBS. Per recent NP note, she has a long-standing issue with  throat swelling and a sensation of blockage when swallowing, particularly with meals (finds relief by lying on her L side). CT Soft Tissue Neck 09/07/21 shows marked gaseous distension of the esophagus from the esophageal verge into the upper chest as well as severe cervical spine degeneration. 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 EGD 01/27/22 shows dilation in the entire esophagus, tortuosity, and a hiatal hernia. Laryngoscopy 08/30/21 showed a normal larynx, vocal cord mobility without ulcer, mass or tumor. Post glottic erythema was noted, possibly secondary to reflux.    PMH: anemia, history of basal cell and squamous cell carcinoma, osteoporosis, HTN     Clinical Impression: Pt presents with an overall functional oropharyngeal swallow but primary esophageal dysphagia. She compensates well for diffuse esophageal distension, achieving complete epiglottic inversion and laryngeal closure. The air-filled esophagus and distended PES divert the path of the bolus but pharyngeal peristalsis does effectively clear the pharynx. There is no significant residue or airway invasion. The 13 mm barium tablet was given with thin liquids and followed with purees contributing to the majority of these boluses remaining in the esophagus. Esophageal dilation and tortuosity have been identified on previous esophagram and endoscopy but per MD note, pt was not complaining of difficulty swallowing at that time. Could consider repeating esophageal assessment now to determine potential to offer relief for pt's symptoms. Otherwise, recommend she continue current diet without ongoing SLP f/u.   Encounter Diagnoses  Name Primary?   Esophageal dysphagia Yes   Abnormal finding on GI tract imaging     Assessment & Plan  Dysphagia that sounds at least in part cricopharyngeal with large tablets, even though no abnormality seen there on MBS.  Her development of esophageal dysphagia as described is not surprising given her known abnormal upper GI anatomy.  Unfortunately, we have no medical or surgical treatment to offer for that. I have encouraged her to stay upright for at least 45 minutes to an hour after meals, something which she says is difficult to do because she gets fatigued from the throat symptoms.  I also demonstrated and recommended a chin tuck maneuver and to take her larger tablets with applesauce or yogurt. Lastly, to decrease the risk of aspiration from retained contents in the esophagus/upper stomach, I recommended she get a foam bed wedge. She was grateful for the information and is returned to primary care.   23 minutes were spent on this encounter, including in depth chart review, independent review of results as outlined above, communicating results with the patient directly, face-to-face time with the patient, coordinating care, ordering studies and medications as appropriate, and documentation.    Victory LITTIE Brand III  CC: Referring provider noted above     [1]  Allergies Allergen Reactions   Shrimp [Shellfish Allergy] Anaphylaxis    ALL SEAFOOD   Prolia  [Denosumab ] Other (See Comments)    Chest pain   Percocet [Oxycodone -Acetaminophen ]     Confusion

## 2024-03-07 NOTE — Patient Instructions (Signed)
 Thank you for trusting me with your gastrointestinal care!    Dr. Victory Legrand [facility]

## 2024-03-07 NOTE — Progress Notes (Signed)
 [facility] Consult Note:  History: Jody [name] 03/07/2024  Referring provider: Caro Harlene POUR, NP  Reason for consult/chief complaint: barrium swallow  (Pt states she feels fine, and wants to know the results of swallow test )   Subjective  Prior history:  November 2023 office visit and subsequent EGD for evaluation of abnormal esophageal/gastric findings on barium esophagram and CT chest as well as right sided neck pain that had also been evaluated by ENT. EGD November 2023 showed dilatation of the esophageal lumen with a hiatal hernia and marked tortuosity and foreshortening of the distal esophagus (due to the hernia) as well as gastric changes of a distant bariatric procedure (?  Vertical banded gastroplasty) No medical or surgical therapy could be offered  This patient is referred back to us  now by primary care for dysphagia and after findings on a modified barium study with their evaluation noted below   History of Present Illness   Jody [name] was here today for dysphagia and with questions related to her recent swallow study.  She was referred back to us  for dysphagia that she describes in 2 ways.  Since I last saw her, she has developed a feeling of fullness in the throat, particular when swallowing large tablets such as calcium.  She also has a feeling of food sitting in the lower chest at times, which she still relieved by laying down for about 30 to 45 minutes.  She has not had coughing while eating, and her MBS did not show evidence of penetration or aspiration.  She still has the marked imaging abnormalities of the esophagus and upper stomach. Appetite generally good and she believes she is maintaining her weight.  No vomiting or rectal bleeding.  ROS:  Review of Systems Denies chest pain or cough Still has some right sided neck discomfort that has been present for many years, unchanged  Past Medical History: Past Medical History:  Diagnosis  Date   Anemia, unspecified    on meds   B12 deficiency due to diet    Basal cell carcinoma    LEFT UPPER ARM SUP. TX=CX3 5FU   Cataract    bilateral sx   Cramp of limb    Edema of both legs    Encounter for long-term (current) use of other medications    Hypertension    on meds   Lumbago    Melanoma (HCC) 10/18/2010   RIGHT POST SHOULDER =MOHS   Obesity    Osteoarthrosis, unspecified whether generalized or localized, unspecified site    generalized   Restless legs syndrome (RLS)    SCC (squamous cell carcinoma) 07/03/2017   RIGHT CHEST CX3 5FU   SCC (squamous cell carcinoma) 01/28/2019   LEFT FOREARM CX3 5FU   SCC (squamous cell carcinoma) [id number]   RIGHT FOREHEAD CX3 5FU   SCC (squamous cell carcinoma) 07/03/2017   RIGHT FOREARM CX3 5FU   SCCA (squamous cell carcinoma) of skin 04/08/2020   Right Anterior Mandible (in situ)(CX35FU)   SCCA (squamous cell carcinoma) of skin 04/08/2020   Left Supraorbital Region (Keratoacanthoma)   Senile osteoporosis    Squamous cell carcinoma of skin 01/30/2017   RIGHT JAWLINE TX WITH BX     Past Surgical History: Past Surgical History:  Procedure Laterality Date   CATARACT EXTRACTION W/ INTRAOCULAR LENS  IMPLANT, BILATERAL  04/2014   COLONOSCOPY     EYE SURGERY Right 04/28/2016   Macular   LEFT HEART CATH AND CORONARY ANGIOGRAPHY N/A 05/15/2023  Procedure: LEFT HEART CATH AND CORONARY ANGIOGRAPHY;  Surgeon: Ladona Heinz, MD;  Location: [facility];  Service: Cardiovascular;  Laterality: N/A;   SPINE SURGERY  2005   STOMACH SURGERY  1981   stapleing    TONSILLECTOMY     UPPER GASTROINTESTINAL ENDOSCOPY     VITRECTOMY Right    with membrane peeling for a macular pucker     Family History: Family History  Problem Relation Age of Onset   Diabetes Father    Prostate cancer Father    Rectal cancer Sister 54   Colon polyps Sister    Cancer Sister    Diabetes Brother    Prostate cancer Brother    Lung cancer Brother  28   Other Brother        declined after a fall   Stomach cancer Maternal Grandmother    Pancreatic cancer Neg Hx    Colon cancer Neg Hx    Esophageal cancer Neg Hx     Social History: Social History   Socioeconomic History   Marital status: Married    Spouse name: Not on file   Number of children: Not on file   Years of education: Not on file   Highest education level: Master's degree (e.g., MA, MS, MEng, MEd, MSW, MBA)  Occupational History   Occupation: Retired Nurse, Mental Health: [employer]  Tobacco Use   Smoking status: Former    Current packs/day: 0.00    Types: Cigarettes    Start date: 1955    Quit date: 1975    Years since quitting: 50.9   Smokeless tobacco: Never   Tobacco comments:    Quit in early 30's  Vaping Use   Vaping status: Never Used  Substance and Sexual Activity   Alcohol use: No    Alcohol/week: 0.0 standard drinks of alcohol   Drug use: No   Sexual activity: Not Currently    Birth control/protection: Post-menopausal  Other Topics Concern   Not on file  Social History Narrative   Married -Jody [name] married 1966   Former Smoker stopped 1977   Alcohol none   Exercise -Deep water 4 times a week for one hour   POA   Are you right handed or left handed? right   Are you currently employed ?    What is your current occupation?   Do you live at home alone?   Who lives with you   What type of home do you live in: 1 story or 2 story? one       Social Drivers of Health   Tobacco Use: Medium Risk (03/05/2024)   Patient History    Smoking Tobacco Use: Former    Smokeless Tobacco Use: Never    Passive Exposure: Not on file  Financial Resource Strain: Patient Declined (01/30/2024)   Overall Financial Resource Strain (CARDIA)    Difficulty of Paying Living Expenses: Patient declined  Food Insecurity: Patient Declined (01/30/2024)   Epic    Worried About Programme Researcher, Broadcasting/film/video in the Last Year: Patient declined    Barista in  the Last Year: Patient declined  Transportation Needs: No Transportation Needs (01/30/2024)   Epic    Lack of Transportation (Medical): No    Lack of Transportation (Non-Medical): No  Physical Activity: Sufficiently Active (01/30/2024)   Exercise Vital Sign    Days of Exercise per Week: 5 days    Minutes of Exercise per Session: 60 min  Stress: Stress Concern Present (01/30/2024)   [employer] of Occupational Health - Occupational Stress Questionnaire    Feeling of Stress: To some extent  Social Connections: Unknown (01/30/2024)   Social Connection and Isolation Panel    Frequency of Communication with Friends and Family: Patient declined    Frequency of Social Gatherings with Friends and Family: Patient declined    Attends Religious Services: Patient declined    Database Administrator or Organizations: Patient declined    Attends Banker Meetings: Not on file    Marital Status: Married  Depression (PHQ2-9): Low Risk (02/02/2024)   Depression (PHQ2-9)    PHQ-2 Score: 0  Alcohol Screen: Not on file  Housing: Patient Declined (01/30/2024)   Epic    Unable to Pay for Housing in the Last Year: Patient declined    Number of Times Moved in the Last Year: Not on file    Homeless in the Last Year: Patient declined  Utilities: Not At Risk (10/04/2023)   Epic    Threatened with loss of utilities: No  Health Literacy: Not on file    Allergies: Allergies[1]  Outpatient Meds: Current Outpatient Medications  Medication Sig Dispense Refill   Calcium Carbonate-Vit D-Min (CALCIUM 1200 PO) Take 1 tablet by mouth in the morning. (Patient taking differently: Take 2 tablets by mouth in the morning.)     celecoxib  (CELEBREX ) 100 MG capsule 1 tablet by mouth daily, will take additional tablet if needed 180 capsule 3   diclofenac Sodium (VOLTAREN ARTHRITIS PAIN) 1 % GEL Apply 1 Application topically 4 (four) times daily as needed (pain.).     gabapentin  (NEURONTIN ) 300 MG capsule Take  1 capsule (300 mg total) by mouth 3 (three) times daily. 270 capsule 3   lidocaine  (LIDODERM ) 5 % Place 1 patch onto the skin daily as needed (pain.). Remove & Discard patch within 12 hours or as directed by MD 90 patch 3   lisinopril  (ZESTRIL ) 5 MG tablet Take 1 tablet (5 mg total) by mouth daily. 90 tablet 3   Multiple Vitamin (MULTIVITAMIN WITH MINERALS) TABS tablet Take 1 tablet by mouth in the morning.     nitroGLYCERIN  (NITROSTAT ) 0.4 MG SL tablet DISSOLVE 1 TABLET UNDER THE TONGUE EVERY 5 MINUTES AS NEEDED FOR CHEST PAIN 25 tablet 3   olopatadine (PATADAY) 0.1 % ophthalmic solution Place 1 drop into both eyes daily.     Probiotic Product (ALIGN) 10 MG CAPS Take 1 tablet by mouth daily. (Patient taking differently: Take 1 tablet by mouth daily. Every other day)     Zoledronic  Acid (RECLAST  IV) as directed. Once yearly, last infusion was November 2024     No current facility-administered medications for this visit.      ___________________________________________________________________ Objective   Exam:  BP 126/70   Pulse 82   Ht 5' (1.524 m)   Wt 120 lb 6 oz (54.6 kg)   BMI 23.51 kg/m  Wt Readings from Last 3 Encounters:  03/07/24 120 lb 6 oz (54.6 kg)  02/02/24 119 lb 12.8 oz (54.3 kg)  10/04/23 117 lb 12.8 oz (53.4 kg)   Note weight stable from months ago General: Well-appearing, normal vocal quality, no muscle wasting.  Thin and petite.  Ambulatory Eyes: sclera anicteric, no redness ENT: oral mucosa moist without lesions, no cervical or supraclavicular lymphadenopathy CV: Regular without appreciable murmur, no JVD, no peripheral edema Resp: clear to auscultation bilaterally, normal RR and effort noted GI: soft, no tenderness, with active bowel  sounds. No guarding or palpable organomegaly noted.    Data:  Modified barium study history and findings:  HPI: Jody [name] is an 83 yo female presenting for an OP MBS. Per recent NP note, she has a long-standing issue with  throat swelling and a sensation of blockage when swallowing, particularly with meals (finds relief by lying on her L side). CT Soft Tissue Neck 09/07/21 shows marked gaseous distension of the esophagus from the esophageal verge into the upper chest as well as severe cervical spine degeneration. 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 EGD 01/27/22 shows dilation in the entire esophagus, tortuosity, and a hiatal hernia. Laryngoscopy 08/30/21 showed a normal larynx, vocal cord mobility without ulcer, mass or tumor. Post glottic erythema was noted, possibly secondary to reflux.    PMH: anemia, history of basal cell and squamous cell carcinoma, osteoporosis, HTN     Clinical Impression: Pt presents with an overall functional oropharyngeal swallow but primary esophageal dysphagia. She compensates well for diffuse esophageal distension, achieving complete epiglottic inversion and laryngeal closure. The air-filled esophagus and distended PES divert the path of the bolus but pharyngeal peristalsis does effectively clear the pharynx. There is no significant residue or airway invasion. The 13 mm barium tablet was given with thin liquids and followed with purees contributing to the majority of these boluses remaining in the esophagus. Esophageal dilation and tortuosity have been identified on previous esophagram and endoscopy but per MD note, pt was not complaining of difficulty swallowing at that time. Could consider repeating esophageal assessment now to determine potential to offer relief for pt's symptoms. Otherwise, recommend she continue current diet without ongoing SLP f/u.   Encounter Diagnoses  Name Primary?   Esophageal dysphagia Yes   Abnormal finding on GI tract imaging     Assessment & Plan  Dysphagia that sounds at least in part cricopharyngeal with large tablets, even though no abnormality seen there on MBS.  Her development of esophageal dysphagia as described is not surprising given her known abnormal upper GI anatomy.  Unfortunately, we have no medical or surgical treatment to offer for that. I have encouraged her to stay upright for at least 45 minutes to an hour after meals, something which she says is difficult to do because she gets fatigued from the throat symptoms.  I also demonstrated and recommended a chin tuck maneuver and to take her larger tablets with applesauce or yogurt. Lastly, to decrease the risk of aspiration from retained contents in the esophagus/upper stomach, I recommended she get a foam bed wedge. She was grateful for the information and is returned to primary care.   23 minutes were spent on this encounter, including in depth chart review, independent review of results as outlined above, communicating results with the patient directly, face-to-face time with the patient, coordinating care, ordering studies and medications as appropriate, and documentation.    Jody [name]  CC: Referring provider noted above     [1]  Allergies Allergen Reactions   Shrimp [Shellfish Allergy] Anaphylaxis    ALL SEAFOOD   Prolia  [Denosumab ] Other (See Comments)    Chest pain   Percocet [Oxycodone -Acetaminophen ]     Confusion

## 2024-03-08 ENCOUNTER — Ambulatory Visit (HOSPITAL_BASED_OUTPATIENT_CLINIC_OR_DEPARTMENT_OTHER): Admitting: Anesthesiology

## 2024-03-08 ENCOUNTER — Other Ambulatory Visit: Payer: Self-pay

## 2024-03-08 ENCOUNTER — Encounter

## 2024-03-08 ENCOUNTER — Encounter (HOSPITAL_BASED_OUTPATIENT_CLINIC_OR_DEPARTMENT_OTHER): Admission: RE | Disposition: A | Payer: Self-pay | Source: Home / Self Care | Attending: Orthopedic Surgery

## 2024-03-08 ENCOUNTER — Ambulatory Visit (HOSPITAL_BASED_OUTPATIENT_CLINIC_OR_DEPARTMENT_OTHER)
Admission: RE | Admit: 2024-03-08 | Discharge: 2024-03-08 | Disposition: A | Attending: Orthopedic Surgery | Admitting: Orthopedic Surgery

## 2024-03-08 ENCOUNTER — Encounter (HOSPITAL_BASED_OUTPATIENT_CLINIC_OR_DEPARTMENT_OTHER): Payer: Self-pay | Admitting: Orthopedic Surgery

## 2024-03-08 DIAGNOSIS — D649 Anemia, unspecified: Secondary | ICD-10-CM | POA: Diagnosis not present

## 2024-03-08 DIAGNOSIS — K219 Gastro-esophageal reflux disease without esophagitis: Secondary | ICD-10-CM | POA: Diagnosis not present

## 2024-03-08 DIAGNOSIS — G5603 Carpal tunnel syndrome, bilateral upper limbs: Secondary | ICD-10-CM | POA: Diagnosis not present

## 2024-03-08 DIAGNOSIS — M1990 Unspecified osteoarthritis, unspecified site: Secondary | ICD-10-CM | POA: Diagnosis not present

## 2024-03-08 DIAGNOSIS — G5602 Carpal tunnel syndrome, left upper limb: Secondary | ICD-10-CM | POA: Diagnosis not present

## 2024-03-08 DIAGNOSIS — I10 Essential (primary) hypertension: Secondary | ICD-10-CM | POA: Diagnosis not present

## 2024-03-08 DIAGNOSIS — Z87891 Personal history of nicotine dependence: Secondary | ICD-10-CM | POA: Diagnosis not present

## 2024-03-08 DIAGNOSIS — E7800 Pure hypercholesterolemia, unspecified: Secondary | ICD-10-CM | POA: Diagnosis not present

## 2024-03-08 DIAGNOSIS — M810 Age-related osteoporosis without current pathological fracture: Secondary | ICD-10-CM | POA: Diagnosis not present

## 2024-03-08 DIAGNOSIS — Z01818 Encounter for other preprocedural examination: Secondary | ICD-10-CM

## 2024-03-08 DIAGNOSIS — K589 Irritable bowel syndrome without diarrhea: Secondary | ICD-10-CM | POA: Diagnosis not present

## 2024-03-08 DIAGNOSIS — Z79899 Other long term (current) drug therapy: Secondary | ICD-10-CM | POA: Diagnosis not present

## 2024-03-08 DIAGNOSIS — E538 Deficiency of other specified B group vitamins: Secondary | ICD-10-CM | POA: Diagnosis not present

## 2024-03-08 HISTORY — PX: CARPAL TUNNEL RELEASE: SHX101

## 2024-03-08 SURGERY — RELEASE, CARPAL TUNNEL, ENDOSCOPIC
Anesthesia: General | Site: Hand | Laterality: Left

## 2024-03-08 MED ORDER — TRAMADOL HCL 50 MG PO TABS: 50.0000 mg | ORAL_TABLET | Freq: Four times a day (QID) | ORAL | 0 refills | Status: DC | PRN

## 2024-03-08 MED ORDER — DROPERIDOL 2.5 MG/ML IJ SOLN: 0.6250 mg | Freq: Once | INTRAMUSCULAR | Status: DC | PRN

## 2024-03-08 MED ORDER — LACTATED RINGERS IV SOLN: INTRAVENOUS | Status: DC | PRN

## 2024-03-08 MED ORDER — CEFAZOLIN SODIUM-DEXTROSE 2-4 GM/100ML-% IV SOLN
2.0000 g | INTRAVENOUS | Status: AC
  Administered 2024-03-08: 2 g via INTRAVENOUS

## 2024-03-08 MED ORDER — ACETAMINOPHEN 500 MG PO TABS
ORAL_TABLET | ORAL | Status: AC
  Filled 2024-03-08: qty 2

## 2024-03-08 MED ORDER — FENTANYL CITRATE (PF) 100 MCG/2ML IJ SOLN
INTRAMUSCULAR | Status: AC
  Filled 2024-03-08: qty 2

## 2024-03-08 MED ORDER — ONDANSETRON HCL 4 MG/2ML IJ SOLN
INTRAMUSCULAR | Status: DC | PRN
  Administered 2024-03-08: 4 mg via INTRAVENOUS

## 2024-03-08 MED ORDER — FENTANYL CITRATE (PF) 100 MCG/2ML IJ SOLN
INTRAMUSCULAR | Status: DC | PRN
  Administered 2024-03-08 (×2): 25 ug via INTRAVENOUS

## 2024-03-08 MED ORDER — CEFAZOLIN SODIUM-DEXTROSE 2-4 GM/100ML-% IV SOLN
INTRAVENOUS | Status: AC
  Filled 2024-03-08: qty 100

## 2024-03-08 MED ORDER — PHENYLEPHRINE 80 MCG/ML (10ML) SYRINGE FOR IV PUSH (FOR BLOOD PRESSURE SUPPORT)
PREFILLED_SYRINGE | INTRAVENOUS | Status: DC | PRN
  Administered 2024-03-08 (×2): 160 ug via INTRAVENOUS

## 2024-03-08 MED ORDER — DEXAMETHASONE SOD PHOSPHATE PF 10 MG/ML IJ SOLN
INTRAMUSCULAR | Status: DC | PRN
  Administered 2024-03-08: 4 mg via INTRAVENOUS

## 2024-03-08 MED ORDER — PROPOFOL 10 MG/ML IV BOLUS
INTRAVENOUS | Status: DC | PRN
  Administered 2024-03-08: 50 mg via INTRAVENOUS
  Administered 2024-03-08: 90 mg via INTRAVENOUS

## 2024-03-08 MED ORDER — LACTATED RINGERS IV SOLN: INTRAVENOUS | Status: DC

## 2024-03-08 MED ORDER — ACETAMINOPHEN 500 MG PO TABS
1000.0000 mg | ORAL_TABLET | Freq: Once | ORAL | Status: AC
  Administered 2024-03-08: 1000 mg via ORAL

## 2024-03-08 MED ORDER — EPHEDRINE SULFATE (PRESSORS) 25 MG/5ML IV SOSY
PREFILLED_SYRINGE | INTRAVENOUS | Status: DC | PRN
  Administered 2024-03-08: 10 mg via INTRAVENOUS

## 2024-03-08 MED ORDER — LIDOCAINE-EPINEPHRINE (PF) 1 %-1:200000 IJ SOLN
INTRAMUSCULAR | Status: DC | PRN
  Administered 2024-03-08: 10 mL

## 2024-03-08 MED ORDER — LIDOCAINE 2% (20 MG/ML) 5 ML SYRINGE
INTRAMUSCULAR | Status: DC | PRN
  Administered 2024-03-08: 60 mg via INTRAVENOUS

## 2024-03-08 MED ORDER — FENTANYL CITRATE (PF) 100 MCG/2ML IJ SOLN: 25.0000 ug | INTRAMUSCULAR | Status: DC | PRN

## 2024-03-08 MED FILL — Phenylephrine-NaCl Pref Syr 0.8 MG/10ML-0.9% (80 MCG/ML): INTRAVENOUS | Qty: 10 | Status: AC

## 2024-03-08 MED FILL — Ephedrine Sulfate Prefilled Syringe 25 MG/5ML (5 MG/ML): INTRAVENOUS | Qty: 5 | Status: AC

## 2024-03-08 MED FILL — Lidocaine HCl Local Soln Prefilled Syringe 100 MG/5ML (2%): INTRAMUSCULAR | Qty: 5 | Status: AC

## 2024-03-08 MED FILL — Ondansetron HCl Inj 4 MG/2ML (2 MG/ML): INTRAMUSCULAR | Qty: 2 | Status: AC

## 2024-03-08 SURGICAL SUPPLY — 32 items
APPLICATOR COTTON TIP 6 STRL (MISCELLANEOUS) ×1 IMPLANT
APPLICATOR DR MATTHEWS STRL (MISCELLANEOUS) ×1 IMPLANT
BLADE SURG 15 STRL LF DISP TIS (BLADE) ×2 IMPLANT
BNDG COHESIVE 4X5 TAN STRL LF (GAUZE/BANDAGES/DRESSINGS) ×1 IMPLANT
BNDG COMPR ESMARK 4X3 LF (GAUZE/BANDAGES/DRESSINGS) ×1 IMPLANT
CHLORAPREP W/TINT 26 (MISCELLANEOUS) ×1 IMPLANT
CORD BIPOLAR FORCEPS 12FT (ELECTRODE) ×1 IMPLANT
COVER BACK TABLE 60X90IN (DRAPES) ×1 IMPLANT
CUFF TOURN SGL QUICK 18X4 (TOURNIQUET CUFF) IMPLANT
DRAPE HAND 77X146 (DRAPES) ×1 IMPLANT
DRAPE SURG 17X23 STRL (DRAPES) ×1 IMPLANT
DRSG AQUACEL AG 3.5X4 (GAUZE/BANDAGES/DRESSINGS) ×1 IMPLANT
DRSG TEGADERM 2-3/8X2-3/4 SM (GAUZE/BANDAGES/DRESSINGS) ×1 IMPLANT
GAUZE SPONGE 2X2 12-PLY NSTRL (GAUZE/BANDAGES/DRESSINGS) ×1 IMPLANT
GAUZE XEROFORM 1X8 LF (GAUZE/BANDAGES/DRESSINGS) ×1 IMPLANT
GLOVE BIO SURGEON STRL SZ7.5 (GLOVE) ×1 IMPLANT
GLOVE BIOGEL PI IND STRL 7.5 (GLOVE) ×1 IMPLANT
GOWN STRL REUS W/ TWL LRG LVL3 (GOWN DISPOSABLE) ×2 IMPLANT
GOWN STRL SURGICAL XL XLNG (GOWN DISPOSABLE) ×2 IMPLANT
KIT ENDO FORWARD CUT SAFEVIEW (SYSTAGENIX WOUND MANAGEMENT) ×1 IMPLANT
NDL HYPO 25X5/8 SAFETYGLIDE (NEEDLE) IMPLANT
PACK BASIN DAY SURGERY FS (CUSTOM PROCEDURE TRAY) ×1 IMPLANT
SHEET MEDIUM DRAPE 40X70 STRL (DRAPES) ×1 IMPLANT
SOLN 0.9% NACL POUR BTL 1000ML (IV SOLUTION) IMPLANT
SOLUTION ANTFG W/FOAM PAD STRL (MISCELLANEOUS) ×1 IMPLANT
SPIKE FLUID TRANSFER (MISCELLANEOUS) IMPLANT
STOCKINETTE IMPERVIOUS 9X36 MD (GAUZE/BANDAGES/DRESSINGS) ×1 IMPLANT
SUT ETHILON 4 0 PS 2 18 (SUTURE) ×1 IMPLANT
SYR BULB EAR ULCER 3OZ GRN STR (SYRINGE) ×2 IMPLANT
SYR CONTROL 10ML LL (SYRINGE) ×1 IMPLANT
TOWEL GREEN STERILE FF (TOWEL DISPOSABLE) ×2 IMPLANT
UNDERPAD 30X36 HEAVY ABSORB (UNDERPADS AND DIAPERS) ×1 IMPLANT

## 2024-03-08 NOTE — Anesthesia Preprocedure Evaluation (Addendum)
 Anesthesia Evaluation  Patient identified by MRN, date of birth, ID band Patient awake    Reviewed: Allergy & Precautions, NPO status , Patient's Chart, lab work & pertinent test results  Airway Mallampati: II  TM Distance: >3 FB Neck ROM: Full    Dental no notable dental hx. (+) Upper Dentures, Lower Dentures   Pulmonary former smoker   Pulmonary exam normal        Cardiovascular hypertension, Pt. on medications  Rhythm:Regular Rate:Normal     Neuro/Psych negative neurological ROS  negative psych ROS   GI/Hepatic Neg liver ROS,GERD  ,,  Endo/Other  negative endocrine ROS    Renal/GU negative Renal ROS  negative genitourinary   Musculoskeletal  (+) Arthritis , Osteoarthritis,  CTS   Abdominal Normal abdominal exam  (+)   Peds  Hematology  (+) Blood dyscrasia, anemia Lab Results      Component                Value               Date                      WBC                      7.3                 02/02/2024                HGB                      11.2 (L)            02/02/2024                HCT                      34.6 (L)            02/02/2024                MCV                      93.8                02/02/2024                PLT                      339                 02/02/2024             Lab Results      Component                Value               Date                      NA                       138                 02/02/2024                K  3.9                 02/02/2024                CO2                      28                  02/02/2024                GLUCOSE                  75                  02/02/2024                BUN                      21                  02/02/2024                CREATININE               0.92                02/02/2024                CALCIUM                  9.6                 02/02/2024                GFR                      52.74 (L)            11/05/2021                EGFR                     61                  02/02/2024                GFRNONAA                 41 (L)              11/14/2019              Anesthesia Other Findings   Reproductive/Obstetrics                              Anesthesia Physical Anesthesia Plan  ASA: 2  Anesthesia Plan: General   Post-op Pain Management: Tylenol  PO (pre-op)*   Induction: Intravenous  PONV Risk Score and Plan: 3 and Ondansetron , Dexamethasone  and Treatment may vary due to age or medical condition  Airway Management Planned: Mask and LMA  Additional Equipment: None  Intra-op Plan:   Post-operative Plan: Extubation in OR  Informed Consent: I have reviewed the patients History and Physical, chart, labs and discussed the procedure including the risks, benefits and alternatives for the proposed anesthesia with the patient or authorized representative who has indicated his/her understanding and acceptance.     Dental advisory given  Plan Discussed with: CRNA  Anesthesia  Plan Comments:          Anesthesia Quick Evaluation

## 2024-03-08 NOTE — Anesthesia Postprocedure Evaluation (Signed)
 Anesthesia Post Note  Patient: Jody [name]  Procedure(s) Performed: RELEASE, CARPAL TUNNEL, ENDOSCOPIC (Left: Hand)     Patient location during evaluation: PACU Anesthesia Type: General Level of consciousness: awake and alert Pain management: pain level controlled Vital Signs Assessment: post-procedure vital signs reviewed and stable Respiratory status: spontaneous breathing, nonlabored ventilation, respiratory function stable and patient connected to nasal cannula oxygen Cardiovascular status: blood pressure returned to baseline and stable Postop Assessment: no apparent nausea or vomiting Anesthetic complications: no   No notable events documented.  Last Vitals:  Vitals:   03/08/24 0845 03/08/24 0858  BP: (!) 97/58 (!) 125/59  Pulse: 72 71  Resp: 17 18  Temp:  36.6 C  SpO2: 96% 95%    Last Pain:  Vitals:   03/08/24 0858  TempSrc:   PainSc: 0-No pain                 Cordella SQUIBB Rion Schnitzer

## 2024-03-08 NOTE — Progress Notes (Signed)
 Per patient placed ring and teeth in her clothing bag in her locker (13) and locked the locker both in a pink cup with pt lable on them

## 2024-03-08 NOTE — Op Note (Signed)
 NAME: RASHEEN SCHEWE MEDICAL RECORD NO: [id number] DATE OF BIRTH: 10-28-1940 FACILITY: [facility] LOCATION: [facility] PHYSICIAN: GILDARDO ALDERTON, MD   OPERATIVE REPORT   DATE OF PROCEDURE: 03/08/2024    PREOPERATIVE DIAGNOSIS: Left carpal tunnel syndrome   POSTOPERATIVE DIAGNOSIS: Left carpal tunnel syndrome   PROCEDURE: Left endoscopic carpal tunnel release   SURGEON:  Gildardo Alderton, M.D.   ASSISTANT: Joesph Hooks, OPA   ANESTHESIA:  General   INTRAVENOUS FLUIDS:  Per anesthesia flow sheet.   ESTIMATED BLOOD LOSS:  Minimal.   COMPLICATIONS:  None.   SPECIMENS:  none   TOURNIQUET TIME:    Total Tourniquet Time Documented: Upper Arm (Left) - 7 minutes Total: Upper Arm (Left) - 7 minutes    DISPOSITION:  Stable to PACU.   INDICATIONS: 83 year old female with clinical history of left-sided carpal tunnel syndrome refractory to conservative care.  Patient was indicated for open versus endoscopic carpal tunnel release.  After discussion, patient elected to proceed with endoscopic carpal tunnel release of the left side.  Risks and benefits of surgery were discussed including the risks of infection, bleeding, scarring, stiffness, nerve injury, vascular injury, tendon injury, need for subsequent operation, persistent symptoms, recurrence.  She voiced understanding of these risks and elected to proceed.  OPERATIVE COURSE: Patient was seen and identified in the preoperative area and marked appropriately.  Surgical consent had been signed. Preoperative IV antibiotic prophylaxis was given. She was transferred to the operating room and placed in supine position with the Left upper extremity on an arm board.  General anesthesia was induced by the anesthesiologist.  Left upper extremity was prepped and draped in normal sterile orthopedic fashion.  A surgical pause was performed between the surgeons, anesthesia, and operating room staff and all were in agreement as to the  patient, procedure, and site of procedure.  Tourniquet was placed and padded appropriately to the left upper arm.  The arm was exsanguinated and the tourniquet was inflated to 250 mmHg.  A 1.5 cm skin incision was designed transversely proximal to the wrist flexion crease.  This incision was carried down through the subcutaneous tissues and through the forearm fascia.  A synovial elevator was introduced to identify the carpal tunnel space.  Sequential dilators were then used to open the carpal tunnel.  The cannula from the SafeView system was introduced in antegrade fashion into the carpal tunnel, and a standard wrist endoscope was used to visualize the undersurface of the transverse carpal ligament.  A probe and a rasp were used to delineate the distal edge of the transverse carpal ligament and to clear the underlying synovial tissues.  At this juncture, a forward cutting blade was introduced in an antegrade fashion was used to divide the transverse carpal ligament in its entirety.  Care was taken to ensure complete division of the structure by probing the resultant defect.  The endoscopic instruments were then removed.  At this point in the procedure, the median nerve was identified at the wrist flexion crease.  The distal end of the forearm fascia was released using tenotomy scissors with particular care taken to avoid injury to the palmar cutaneous branch of the median nerve.  The median nerve appeared completely decompressed in both the palm and distal forearm.  The wound was copiously irrigated, the tourniquet was deflated and hemostasis was achieved with bipolar electrocautery.  Tourniquet time was 7 minutes.  The wound was closed with 4-0 nylon suture in vertical mattress fashion.  Sterile dressings were  applied.  Patient was subsequently awoken from anesthesia and transported to the postoperative unit in stable condition.   Post-operative plan: The patient will recover in the post-anesthesia care  unit and then be discharged home.  The patient will be non weight bearing on the left upper extremity in a soft dressing.   I will see the patient back in the office in 2 weeks for postoperative followup.    Prescott Truex, MD Electronically signed, 03/08/2024

## 2024-03-08 NOTE — Interval H&P Note (Signed)
 Patient presents today for left side endoscopic carpal tunnel release. No changes in medical history since recent office visit.  Risk and benefits of the procedure have been discussed in detail, patient elects to proceed.  Jody Erbes,MD

## 2024-03-08 NOTE — Interval H&P Note (Signed)
 Patient presents today for left side endoscopic carpal tunnel release. No changes in history or exam since recent office visit.  Risks and benefits of the procedure have been discussed, patient elects to proceed as scheduled.

## 2024-03-08 NOTE — Transfer of Care (Signed)
 Immediate Anesthesia Transfer of Care Note  Patient: Jody [name]  Procedure(s) Performed: RELEASE, CARPAL TUNNEL, ENDOSCOPIC (Left: Hand)  Patient Location: PACU  Anesthesia Type:General  Level of Consciousness: drowsy and patient cooperative  Airway & Oxygen Therapy: Patient Spontanous Breathing and Patient connected to face mask oxygen  Post-op Assessment: Report given to RN and Post -op Vital signs reviewed and stable  Post vital signs: Reviewed and stable  Last Vitals:  Vitals Value Taken Time  BP 105/53 03/08/24 08:10  Temp 36.9 C 03/08/24 08:10  Pulse 70 03/08/24 08:13  Resp 12 03/08/24 08:13  SpO2 100 % 03/08/24 08:13  Vitals shown include unfiled device data.  Last Pain:  Vitals:   03/08/24 0639  TempSrc: Temporal  PainSc: 0-No pain         Complications: No notable events documented.

## 2024-03-08 NOTE — Discharge Instructions (Addendum)
° ° °  Hand Surgery Postop Instructions    Dressings: Maintain postoperative dressing for 5 days.   After 5 days, it is okay to unwrap postoperative dressing and apply Band-Aid or rewrap.   Keep operative site clean and dry until orthopedic follow-up.  Wound Care: Keep your hand elevated above the level of your heart.  Do not allow it to dangle by your side. Moving your fingers is advised to stimulate circulation but will depend on the site of your surgery.  If you have a splint applied, your doctor will advise you regarding movement.  Activity: Do not drive or operate machinery until clearance given from physician. No heavy lifting with operative extremity.  Diet:  Drink liquids today or eat a light diet.  You may resume a regular diet tomorrow.    General expectations: Pain for two to three days. Take prescribed medication if given, transition to over-the-counter medication as quickly as possible. Fingers may become slightly swollen.  Call your doctor if any of the following occur: Severe pain not relieved by pain medication. Elevated temperature. Dressing soaked with blood. Inability to move fingers. White or bluish color to fingers.   Per [facility] clinic policy, our goal is ensure optimal postoperative pain control with a multimodal pain management strategy. For all [facility] patients, our goal is to wean post-operative narcotic medications by 6 weeks post-operatively. If this is not possible due to utilization of pain medication prior to surgery, your [facility] doctor will support your acute post-operative pain control for the first 6 weeks postoperatively, with a plan to transition you back to your primary pain team following that. Jody [name] will work to ensure a therapist, occupational.  Jody [name], M.D. Hand Surgery [facility]  No Tylenol  until after 12:45pm today.   Post Anesthesia Home Care Instructions  Activity: Get plenty of rest for the remainder  of the day. A responsible individual must stay with you for 24 hours following the procedure.  For the next 24 hours, DO NOT: -Drive a car -Advertising copywriter -Drink alcoholic beverages -Take any medication unless instructed by your physician -Make any legal decisions or sign important papers.  Meals: Start with liquid foods such as gelatin or soup. Progress to regular foods as tolerated. Avoid greasy, spicy, heavy foods. If nausea and/or vomiting occur, drink only clear liquids until the nausea and/or vomiting subsides. Call your physician if vomiting continues.  Special Instructions/Symptoms: Your throat may feel dry or sore from the anesthesia or the breathing tube placed in your throat during surgery. If this causes discomfort, gargle with warm salt water. The discomfort should disappear within 24 hours.  If you had a scopolamine patch placed behind your ear for the management of post- operative nausea and/or vomiting:  1. The medication in the patch is effective for 72 hours, after which it should be removed.  Wrap patch in a tissue and discard in the trash. Wash hands thoroughly with soap and water. 2. You may remove the patch earlier than 72 hours if you experience unpleasant side effects which may include dry mouth, dizziness or visual disturbances. 3. Avoid touching the patch. Wash your hands with soap and water after contact with the patch.

## 2024-03-08 NOTE — Therapy (Incomplete)
 OUTPATIENT PHYSICAL THERAPY LOWER EXTREMITY TREATMENT   Patient Name: Jody [name] MRN: [id number] DOB:July 01, 1940, 83 y.o., female Today's Date: 03/08/2024  END OF SESSION:      Past Medical History:  Diagnosis Date   Anemia, unspecified    on meds   B12 deficiency due to diet    Basal cell carcinoma    LEFT UPPER ARM SUP. TX=CX3 5FU   Cataract    bilateral sx   Cramp of limb    Edema of both legs    Encounter for long-term (current) use of other medications    Hypertension    on meds   Lumbago    Melanoma (HCC) 10/18/2010   RIGHT POST SHOULDER =MOHS   Obesity    Osteoarthrosis, unspecified whether generalized or localized, unspecified site    generalized   Restless legs syndrome (RLS)    SCC (squamous cell carcinoma) 07/03/2017   RIGHT CHEST CX3 5FU   SCC (squamous cell carcinoma) 01/28/2019   LEFT FOREARM CX3 5FU   SCC (squamous cell carcinoma) [id number]   RIGHT FOREHEAD CX3 5FU   SCC (squamous cell carcinoma) 07/03/2017   RIGHT FOREARM CX3 5FU   SCCA (squamous cell carcinoma) of skin 04/08/2020   Right Anterior Mandible (in situ)(CX35FU)   SCCA (squamous cell carcinoma) of skin 04/08/2020   Left Supraorbital Region (Keratoacanthoma)   Senile osteoporosis    Squamous cell carcinoma of skin 01/30/2017   RIGHT JAWLINE TX WITH BX   Past Surgical History:  Procedure Laterality Date   CATARACT EXTRACTION W/ INTRAOCULAR LENS  IMPLANT, BILATERAL  04/2014   COLONOSCOPY     EYE SURGERY Right 04/28/2016   Macular   LEFT HEART CATH AND CORONARY ANGIOGRAPHY N/A 05/15/2023   Procedure: LEFT HEART CATH AND CORONARY ANGIOGRAPHY;  Surgeon: Ladona Heinz, MD;  Location: [facility];  Service: Cardiovascular;  Laterality: N/A;   SPINE SURGERY  2005   STOMACH SURGERY  1981   stapleing    TONSILLECTOMY     UPPER GASTROINTESTINAL ENDOSCOPY     VITRECTOMY Right    with membrane peeling for a macular pucker   Patient Active Problem List   Diagnosis Date Noted    Left carpal tunnel syndrome 03/08/2024   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   Neuropathy 07/31/2023   Pure hypercholesterolemia 11/14/2019   Gastroesophageal reflux disease 02/04/2019   Lumbar back pain with radiculopathy affecting left lower extremity 04/11/2016   Cervical spondylosis without myelopathy 06/08/2015   Absolute anemia 05/16/2014   Primary osteoarthritis involving multiple joints 05/16/2014   Essential hypertension 09/03/2012   Senile osteoporosis    B12 deficiency due to diet     PCP: Harlene MARLA An, NP  REFERRING PROVIDER: Lonell Sprang, DO  REFERRING DIAG: M17.0 (ICD-10-CM) - Bilateral primary osteoarthritis of knee M19.011,M19.012 (ICD-10-CM) - Primary osteoarthritis of shoulders, bilateral M12.811,M12.812 (ICD-10-CM) - Rotator cuff arthropathy of both shoulders R29.898 (ICD-10-CM) - Leg weakness, bilateral  THERAPY DIAG:  No diagnosis found.  Rationale for Evaluation and Treatment: Rehabilitation  ONSET DATE: 01/28/2024 is when it worsened   SUBJECTIVE:   SUBJECTIVE STATEMENT: *** It feels like it is a little worse today. Patient endorses that her knees are always hurting.  PERTINENT HISTORY: Patient reports that she has chronic bilat knee (Lt>Rt) pain and wears a brace on Lt knee. Patient endorses that she fell and fractured Lt patella in 2017 which is why it still hurts. Patient also endorses one other fall in 2018 out  of her attic which led to a hospitalization and cervical fusion. Patient endorses bilat shoulder pain (Rt>Lt) and requires assistance for reaching with Rt shoulder flexion. Patient recently finished a prednisone  regimen which has improved overall symptoms/pain back to her normal. Patient recently began feeling aching/trembling in Lt LE, has been experiencing generalized muscle atrophy and weakness, and requires aquatic therapy at least 4x/wk in order to continue to be able to get out of bed. Patient  endorses one other orthopedic surgery in lower back with breakdown of adjacent vertebrae above and below. Patient has severe osteoporosis.   See PMH or personal factors for more in depth comorbidities  PAIN:  Are you having pain? Yes: NPRS scale: not rated this session  Pain location: shoulder, knees, LEs in general Pain description: consistent, debilitating Aggravating factors: standing, walking, reaching, functional IR  Relieving factors: aquatic therapy   PRECAUTIONS: Fall and high risk for fractures as she is severely osteoporotic   RED FLAGS: Bowel or bladder incontinence: Yes: referral in for a urologist and Cauda equina syndrome: No   WEIGHT BEARING RESTRICTIONS: No  FALLS:  Has patient fallen in last 6 months? No  LIVING ENVIRONMENT: Lives with: lives with their spouse Lives in: House/apartment Stairs: Yes: External: 1 from carport to inside the house steps; none; steps into the attic with pulldown rail which she goes into often Has following equipment at home: Single point cane, Environmental Consultant - 2 wheeled, Environmental Consultant - 4 wheeled, Marine Scientist  OCCUPATION: retired   PLOF: Independent and uses a Recruitment Consultant  PATIENT GOALS: to get a little stronger, to get out of a chair without making a spectacle   NEXT MD VISIT: 02/19/2024 with Dr. Burnetta   OBJECTIVE:  Note: Objective measures were completed at Evaluation unless otherwise noted.  DIAGNOSTIC FINDINGS:  X-rays demonstrate severe bone-on-bone collapse of the lateral tibiofemoral joint space. There is notable subchondral sclerosis of the lateral femoral condyle and lateral tibial plateau. The knee is fall into a valgus fashion bilaterally. Lateral view of the left knee shows healed fragmentation of the patella likely from prior fracture. No acute fracture or acute bony abnormality otherwise noted.   PATIENT SURVEYS:  PSFS: THE PATIENT SPECIFIC FUNCTIONAL SCALE  Place score of 0-10 (0 = unable to perform activity and 10 =  able to perform activity at the same level as before injury or problem)  Activity Date:  02/08/2024    Walking distance  5    2. Getting up from a chair  5    3. Using arms 3    4.  Balance and mobility  5    5. Walking with a purpose 3    Total Score 4.2      Total Score = Sum of activity scores/number of activities  Minimally Detectable Change: 3 points (for single activity); 2 points (for average score)  [facility] (nd). The Patient Specific Functional Scale . Retrieved from [email or web address]   COGNITION: Overall cognitive status: Within functional limits for tasks assessed     SENSATION: Light touch: WFL Patient endorses highly sensitive to touch on bilat LE   POSTURE: rounded shoulders, forward head, and increased thoracic kyphosis   LOWER EXTREMITY ROM:  Active ROM Right Eval 02/08/2024 Left Eval 02/08/2024  Hip flexion    Hip extension    Hip abduction    Hip adduction    Hip internal rotation    Hip external rotation    Knee flexion (seated) [facility]  Knee  extension (seated) WFL ~90% WFL  Ankle dorsiflexion    Ankle plantarflexion    Ankle inversion    Ankle eversion     (Blank rows = not tested)  UE ROM: 02/08/2024, seated AROM  Shoulder flexion: Lt: WFL Rt: 69deg Shoulder abduction: Lt: WFL, but painful around 90deg Rt: 60deg Shoulder functional IR: Lt: T12 Rt: iliac crest   Shoulder functional ER: Lt: T2 Rt: UT  LOWER EXTREMITY MMT: painful with MMT   MMT Right Eval 02/08/2024 Left Eval 02/08/2024  Hip flexion (seated) 3+/5 3+/5  Hip extension    Hip abduction    Hip adduction    Hip internal rotation    Hip external rotation    Knee flexion (seated) 4-/5 4-/5  Knee extension (seated) 4/5 4-/5  Ankle dorsiflexion     Ankle plantarflexion    Ankle inversion    Ankle eversion     (Blank rows = not tested)   FUNCTIONAL TESTS:  5 times sit to stand: 13s from arm  chair using bilat UEs after first stand    Slump 02/08/2024: positive on Rt side   GAIT: Distance walked: not objectively measured  Assistive device utilized: SPC/walking stick Level of assistance: supervision  Comments: antalgic and decreased speed                                                                                                                                TREATMENT DATE:  03/11/2024 ***   03/05/2024 TherEx: Nustep level 4 for 8 minutes with bilat LE only  Seated shoulder flexion with 2# bar 2x12  Seated shoulder abduction 1x20  Seated ER with scap retraction 1x20  Calf raises 1x20   TherAct:  Sit<>stands with 5# KB 3x5 (mat table at 19.5)  Step ups with 4 step 1x8 leading with each foot; intermittent UE use for stability purposes   02/29/2024 TherEx:  Nustep level 3 for 8 minutes with bilat UE and LE  Seated LAQ with 1.5# weights 2x12   Standing hamstring curls with UE support for appropriate form 1x6 each side; PT discussing importance of weight shifting onto opposite leg   TherAct:  Reaching to cabinet with 1# weight 2x6 each side ; increased difficulty on Rt side  Supine shoulder flexion AAROM with PVC 1x10    02/08/2024 TherEx:  HEP handout provided with patient performing one set of each activity for appropriate form. Demonstration, verbal and tactile cues provided.   Self-Care: POC, aquatic physical therapy (with handout provided), sciatica    PATIENT EDUCATION:  Education details: HEP, POC, aquatic PT, sciatica  Person educated: Patient Education method: Explanation, Demonstration, Tactile cues, Verbal cues, and Handouts Education comprehension: verbalized understanding, returned demonstration, verbal cues required, tactile cues required, and needs further education  HOME EXERCISE PROGRAM: Access Code: [email or web address] URL: [email or web address] Date: 02/08/2024 Prepared by: Susannah Daring  Exercises - Mini Squat with Counter  Support  - 1 x daily - 7 x  weekly - 2 sets - 5 reps - Seated Long Arc Quad  - 1 x daily - 7 x weekly - 2 sets - 10 reps - 2seconds hold - Standing Knee Flexion AROM with Chair Support  - 1 x daily - 7 x weekly - 2 sets - 10 reps - 2seconds  hold - AAROM/AROM Shoulder Flexion at Cabinet  - 1 x daily - 7 x weekly - 2 sets - 10 reps  ASSESSMENT:  CLINICAL IMPRESSION:  *** Patient arrived to session noting slightly increased pain compared to last session, but has been dealing with it as it is all chronic. Patient tolerated all activities this date. Patient will continue to benefit from skilled PT.   OBJECTIVE IMPAIRMENTS: Abnormal gait, decreased activity tolerance, decreased balance, decreased coordination, decreased endurance, decreased mobility, difficulty walking, decreased ROM, decreased strength, improper body mechanics, postural dysfunction, and pain.   ACTIVITY LIMITATIONS: carrying, lifting, bending, sitting, standing, squatting, stairs, transfers, continence, dressing, and reach over head  PARTICIPATION LIMITATIONS: cleaning, community activity, and yard work  PERSONAL FACTORS: Age, Behavior pattern, Past/current experiences, Time since onset of injury/illness/exacerbation, and 3+ comorbidities: anemia, osteoporosis, history of cancer, cataracts, HTN, prior falls are also affecting patient's functional outcome.   REHAB POTENTIAL: Fair lacks compliance, high pain levels, multiple comorbidities   CLINICAL DECISION MAKING: Evolving/moderate complexity  EVALUATION COMPLEXITY: Moderate   GOALS: Goals reviewed with patient? Yes  SHORT TERM GOALS: Target date: 03/07/2024 Patient will show compliance with initial HEP. Baseline: Goal status: GOAL MET, 02/29/2024  2.  Patient will report pain levels no greater than 6/10 in order to show an improvement in overall quality of life. Baseline:  Goal status: goal ongoing, 02/29/2024   LONG TERM GOALS: Target date: 04/18/2024  Patient will  be independent with final HEP in order to maintain and progress upon functional gains made within PT. Baseline:  Goal status: INITIAL  2.  Patient will report pain levels no greater than 5/10 in order to show an improvement in overall quality of life. Baseline:  Goal status: INITIAL  3.  Patient will increase PSFS to at least 6.2 in order to show a significant improvement in subjective disability. Baseline:  Goal status: INITIAL  4.  Patient will perform 5xSTS to at least 12s in order to decrease risk of falls.  Baseline:  Goal status: INITIAL  5.  Patient will improve Rt shoulder flexion and abduction to at least 90deg to improve overall functional mobility.  Baseline:  Goal status: INITIAL  6.  Patient will increase bilat hip flexion strength to at least 4/5 in order to improve biomechanics with functional mobility. Baseline:  Goal status: INITIAL   PLAN:  PT FREQUENCY: 1-2x/week  PT DURATION: 10 weeks  PLANNED INTERVENTIONS: 97164- PT Re-evaluation, 97750- Physical Performance Testing, 97110-Therapeutic exercises, 97530- Therapeutic activity, [id number]- Neuromuscular re-education, 97535- Self Care, [id number]- Manual therapy, [id number]- Gait training, [phone]- Orthotic Initial, [phone]- Orthotic/Prosthetic subsequent, [phone]- Canalith repositioning, [phone]- Aquatic Therapy, [phone]- Electrical stimulation (unattended), [phone]- Electrical stimulation (manual), [id number]- Vasopneumatic device, [id number]- Ultrasound, [id number]- Ionotophoresis 4mg /ml Dexamethasone , 79439 (1-2 muscles), [id number] (3+ muscles)- Dry Needling, Patient/Family education, Balance training, Stair training, Taping, Joint mobilization, Spinal mobilization, Vestibular training, DME instructions, Cryotherapy, and Moist heat  PLAN FOR NEXT SESSION:  *** UE strength and mobility, LE strength and mobility, weightbearing activities, balance assessment, TUG    Susannah Daring, PT, DPT 03/08/2024 12:52 PM

## 2024-03-08 NOTE — Anesthesia Procedure Notes (Signed)
 Procedure Name: LMA Insertion Date/Time: 03/08/2024 7:33 AM  Performed by: Denton Niels CROME, CRNAPre-anesthesia Checklist: Patient identified, Emergency Drugs available, Suction available, Patient being monitored and Timeout performed Patient Re-evaluated:Patient Re-evaluated prior to induction Oxygen Delivery Method: Circle system utilized Preoxygenation: Pre-oxygenation with 100% oxygen Induction Type: IV induction Ventilation: Mask ventilation without difficulty LMA: LMA inserted LMA Size: 3.0 Number of attempts: 1 Placement Confirmation: positive ETCO2 Tube secured with: Tape Dental Injury: Teeth and Oropharynx as per pre-operative assessment

## 2024-03-09 ENCOUNTER — Encounter (HOSPITAL_BASED_OUTPATIENT_CLINIC_OR_DEPARTMENT_OTHER): Payer: Self-pay | Admitting: Orthopedic Surgery

## 2024-03-11 ENCOUNTER — Encounter: Payer: Self-pay | Admitting: Rehabilitative and Restorative Service Providers"

## 2024-03-11 ENCOUNTER — Ambulatory Visit

## 2024-03-11 DIAGNOSIS — M6281 Muscle weakness (generalized): Secondary | ICD-10-CM

## 2024-03-11 DIAGNOSIS — R2681 Unsteadiness on feet: Secondary | ICD-10-CM

## 2024-03-11 DIAGNOSIS — R2689 Other abnormalities of gait and mobility: Secondary | ICD-10-CM

## 2024-03-11 DIAGNOSIS — R293 Abnormal posture: Secondary | ICD-10-CM

## 2024-03-11 DIAGNOSIS — G8929 Other chronic pain: Secondary | ICD-10-CM

## 2024-03-11 NOTE — Therapy (Signed)
 OUTPATIENT PHYSICAL THERAPY REATMENT   Patient Name: Jody [name] MRN: [id number] DOB:[birth year], 83 y.o., female Today's Date: 03/11/2024  END OF SESSION:  PT End of Session - 03/11/24 1014     Visit Number 4    Number of Visits 20    Date for Recertification  04/18/24    Authorization Type MEDICARE AND [employer]    Progress Note Due on Visit 10    PT Start Time 1009    PT Stop Time 1048    PT Time Calculation (min) 39 min    Activity Tolerance Patient limited by pain    Behavior During Therapy WFL for tasks assessed/performed             Past Medical History:  Diagnosis Date   Anemia, unspecified    on meds   B12 deficiency due to diet    Basal cell carcinoma    LEFT UPPER ARM SUP. TX=CX3 5FU   Cataract    bilateral sx   Cramp of limb    Edema of both legs    Encounter for long-term (current) use of other medications    Hypertension    on meds   Lumbago    Melanoma (HCC) 10/18/2010   RIGHT POST SHOULDER =MOHS   Obesity    Osteoarthrosis, unspecified whether generalized or localized, unspecified site    generalized   Restless legs syndrome (RLS)    SCC (squamous cell carcinoma) 07/03/2017   RIGHT CHEST CX3 5FU   SCC (squamous cell carcinoma) 01/28/2019   LEFT FOREARM CX3 5FU   SCC (squamous cell carcinoma) [id number]   RIGHT FOREHEAD CX3 5FU   SCC (squamous cell carcinoma) 07/03/2017   RIGHT FOREARM CX3 5FU   SCCA (squamous cell carcinoma) of skin 04/08/2020   Right Anterior Mandible (in situ)(CX35FU)   SCCA (squamous cell carcinoma) of skin 04/08/2020   Left Supraorbital Region (Keratoacanthoma)   Senile osteoporosis    Squamous cell carcinoma of skin 01/30/2017   RIGHT JAWLINE TX WITH BX   Past Surgical History:  Procedure Laterality Date   CARPAL TUNNEL RELEASE Left 03/08/2024   Procedure: RELEASE, CARPAL TUNNEL, ENDOSCOPIC;  Surgeon: Arlinda Buster, MD;  Location: [facility];  Service: Orthopedics;  Laterality: Left;    CATARACT EXTRACTION W/ INTRAOCULAR LENS  IMPLANT, BILATERAL  04/2014   COLONOSCOPY     EYE SURGERY Right 04/28/2016   Macular   LEFT HEART CATH AND CORONARY ANGIOGRAPHY N/A 05/15/2023   Procedure: LEFT HEART CATH AND CORONARY ANGIOGRAPHY;  Surgeon: Ladona Heinz, MD;  Location: [facility];  Service: Cardiovascular;  Laterality: N/A;   SPINE SURGERY  2005   STOMACH SURGERY  1981   stapleing    TONSILLECTOMY     UPPER GASTROINTESTINAL ENDOSCOPY     VITRECTOMY Right    with membrane peeling for a macular pucker   Patient Active Problem List   Diagnosis Date Noted   Left carpal tunnel syndrome 03/08/2024   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   Neuropathy 07/31/2023   Pure hypercholesterolemia 11/14/2019   Gastroesophageal reflux disease 02/04/2019   Lumbar back pain with radiculopathy affecting left lower extremity 04/11/2016   Cervical spondylosis without myelopathy 06/08/2015   Absolute anemia 05/16/2014   Primary osteoarthritis involving multiple joints 05/16/2014   Essential hypertension 09/03/2012   Senile osteoporosis    B12 deficiency due to diet     PCP: Harlene MARLA An, NP  REFERRING PROVIDER: Lonell Sprang, DO  REFERRING DIAG: M17.0 (ICD-10-CM) - Bilateral primary osteoarthritis of knee M19.011,M19.012 (ICD-10-CM) - Primary osteoarthritis of shoulders, bilateral M12.811,M12.812 (ICD-10-CM) - Rotator cuff arthropathy of both shoulders R29.898 (ICD-10-CM) - Leg weakness, bilateral  THERAPY DIAG:  Chronic pain of both knees  Muscle weakness (generalized)  Chronic pain of both shoulders  Other abnormalities of gait and mobility  Abnormal posture  Unsteadiness on feet  Rationale for Evaluation and Treatment: Rehabilitation  ONSET DATE: 01/28/2024 is when it worsened   SUBJECTIVE:   SUBJECTIVE STATEMENT: Pt indicated having shoulder pain and knee pain.  Reported surgery on Lt hand recently.    PERTINENT  HISTORY: Patient reports that she has chronic bilat knee (Lt>Rt) pain and wears a brace on Lt knee. Patient endorses that she fell and fractured Lt patella in 2017 which is why it still hurts. Patient also endorses one other fall in 2018 out of her attic which led to a hospitalization and cervical fusion. Patient endorses bilat shoulder pain (Rt>Lt) and requires assistance for reaching with Rt shoulder flexion. Patient recently finished a prednisone  regimen which has improved overall symptoms/pain back to her normal. Patient recently began feeling aching/trembling in Lt LE, has been experiencing generalized muscle atrophy and weakness, and requires aquatic therapy at least 4x/wk in order to continue to be able to get out of bed. Patient endorses one other orthopedic surgery in lower back with breakdown of adjacent vertebrae above and below. Patient has severe osteoporosis.   See PMH or personal factors for more in depth comorbidities  PAIN:  NPRS scale: upon arrival knees < 5/10 Pain location: shoulder, knees, LEs in general Pain description: consistent, debilitating Aggravating factors: standing, walking, reaching, functional IR  Relieving factors: aquatic therapy   PRECAUTIONS: Fall and high risk for fractures as she is severely osteoporotic   RED FLAGS: Bowel or bladder incontinence: Yes: referral in for a urologist and Cauda equina syndrome: No   WEIGHT BEARING RESTRICTIONS: No  FALLS:  Has patient fallen in last 6 months? No  LIVING ENVIRONMENT: Lives with: lives with their spouse Lives in: House/apartment Stairs: Yes: External: 1 from carport to inside the house steps; none; steps into the attic with pulldown rail which she goes into often Has following equipment at home: Single point cane, Environmental Consultant - 2 wheeled, Environmental Consultant - 4 wheeled, Marine Scientist  OCCUPATION: retired   PLOF: Independent and uses a Recruitment Consultant  PATIENT GOALS: to get a little stronger, to get out of a  chair without making a spectacle   NEXT MD VISIT: 02/19/2024 with Dr. Burnetta   OBJECTIVE:  Note: Objective measures were completed at Evaluation unless otherwise noted.  DIAGNOSTIC FINDINGS:  X-rays demonstrate severe bone-on-bone collapse of the lateral tibiofemoral joint space. There is notable subchondral sclerosis of the lateral femoral condyle and lateral tibial plateau. The knee is fall into a valgus fashion bilaterally. Lateral view of the left knee shows healed fragmentation of the patella likely from prior fracture. No acute fracture or acute bony abnormality otherwise noted.   PATIENT SURVEYS:  PSFS: THE PATIENT SPECIFIC FUNCTIONAL SCALE  Place score of 0-10 (0 = unable to perform activity and 10 = able to perform activity at the same level as before injury or problem)  Activity Date:  02/08/2024 03/11/2024   Walking distance  5 7   2. Getting up from a chair  5 6   3. Using arms (reaching) 3 7   4.  Balance and mobility  5 5   5. Walking  with a purpose 3 2   Total Score 4.2 5.4     Total Score = Sum of activity scores/number of activities  Minimally Detectable Change: 3 points (for single activity); 2 points (for average score)  [facility] (nd). The Patient Specific Functional Scale . Retrieved from [email or web address]   COGNITION: Eval:  Overall cognitive status: Within functional limits for tasks assessed     SENSATION: Eval: Light touch: WFL Patient endorses highly sensitive to touch on bilat LE   POSTURE:  Eval: rounded shoulders, forward head, and increased thoracic kyphosis   LOWER EXTREMITY ROM:  Active ROM Right Eval 02/08/2024 Left Eval 02/08/2024  Hip flexion    Hip extension    Hip abduction    Hip adduction    Hip internal rotation    Hip external rotation    Knee flexion (seated) [facility] WFL  Knee extension (seated) WFL ~90% WFL  Ankle dorsiflexion    Ankle  plantarflexion    Ankle inversion    Ankle eversion     (Blank rows = not tested)  UE ROM: 02/08/2024, seated AROM  Shoulder flexion: Lt: WFL Rt: 69deg Shoulder abduction: Lt: WFL, but painful around 90deg Rt: 60deg Shoulder functional IR: Lt: T12 Rt: iliac crest   Shoulder functional ER: Lt: T2 Rt: UT  LOWER EXTREMITY MMT: painful with MMT   MMT Right Eval 02/08/2024 Left Eval 02/08/2024 Right 03/11/2024 Left 03/11/2024  Hip flexion (seated) 3+/5 3+/5 4/5 4/5  Hip extension      Hip abduction      Hip adduction      Hip internal rotation      Hip external rotation      Knee flexion (seated) 4-/5 4-/5    Knee extension (seated) 4/5 4-/5    Ankle dorsiflexion       Ankle plantarflexion      Ankle inversion      Ankle eversion       (Blank rows = not tested)   FUNCTIONAL TESTS:  Eval: 5 times sit to stand: 13s from arm chair using bilat UEs after first stand   SPECIAL TESTS: Slump 02/08/2024: positive on Rt side   GAIT: Eval:  Distance walked: not objectively measured  Assistive device utilized: SPC/walking stick Level of assistance: supervision  Comments: antalgic and decreased speed                   TREATMENT         DATE:  03/11/2024 Therex: Nustep Lvl 5 for 6.5 mins LE only  Incline gastroc stretch 30 sec x 3 bilaterally   Self Care Advice given on hydration and stretching to help cramping.  Also mentioned possible electrolyte drink (watch sugar content).   TherActivity (to improve transfers, stairs, reaching ) Step up forward 4 inch step with hand assist on bar x 10 bilaterally with slow lowering  Sit to stand to sit 18 inch chair s UE assist with slow sitting 2 x 5  Seated wand AAROM flexion to tolerance. 2 lb 2 x 10 (adjusted hand position for Lt to help more in concentric movement.   Neuro Re-ed Modified tandem stance 30 sec x 2 with SBA Alternating heel/toe lifts x 20 each way with SBA , cues for techniques  TREATMENT         DATE:  03/05/2024 TherEx: Nustep level 4 for 8 minutes with bilat LE only  Seated shoulder flexion with 2# bar 2x12  Seated shoulder abduction 1x20  Seated ER with scap retraction 1x20  Calf raises 1x20   TherAct:  Sit<>stands with 5# KB 3x5 (mat table at 19.5)  Step ups with 4 step 1x8 leading with each foot; intermittent UE use for stability purposes   TREATMENT         DATE: 02/29/2024 TherEx:  Nustep level 3 for 8 minutes with bilat UE and LE  Seated LAQ with 1.5# weights 2x12   Standing hamstring curls with UE support for appropriate form 1x6 each side; PT discussing importance of weight shifting onto opposite leg   TherAct:  Reaching to cabinet with 1# weight 2x6 each side ; increased difficulty on Rt side  Supine shoulder flexion AAROM with PVC 1x10    TREATMENT         DATE: 02/08/2024 TherEx:  HEP handout provided with patient performing one set of each activity for appropriate form. Demonstration, verbal and tactile cues provided.   Self-Care: POC, aquatic physical therapy (with handout provided), sciatica    PATIENT EDUCATION:  Education details: HEP, POC, aquatic PT, sciatica  Person educated: Patient Education method: Explanation, Demonstration, Tactile cues, Verbal cues, and Handouts Education comprehension: verbalized understanding, returned demonstration, verbal cues required, tactile cues required, and needs further education  HOME EXERCISE PROGRAM: Access Code: [email or web address] URL: [email or web address] Date: 02/08/2024 Prepared by: Susannah Daring  Exercises - Mini Squat with Counter Support  - 1 x daily - 7 x weekly - 2 sets - 5 reps - Seated Long Arc Quad  - 1 x daily - 7 x weekly - 2 sets - 10 reps - 2seconds hold - Standing Knee Flexion AROM with Chair Support  - 1 x daily - 7 x weekly - 2 sets - 10 reps - 2seconds  hold - AAROM/AROM  Shoulder Flexion at Cabinet  - 1 x daily - 7 x weekly - 2 sets - 10 reps  ASSESSMENT:  CLINICAL IMPRESSION: Mild improvement in PSFS reassessment today.  Continued presentation of strength, pain, balance deficits which may impact daily movement control and function.  Continued strengthening may help improve WB acceptance and control.   OBJECTIVE IMPAIRMENTS: Abnormal gait, decreased activity tolerance, decreased balance, decreased coordination, decreased endurance, decreased mobility, difficulty walking, decreased ROM, decreased strength, improper body mechanics, postural dysfunction, and pain.   ACTIVITY LIMITATIONS: carrying, lifting, bending, sitting, standing, squatting, stairs, transfers, continence, dressing, and reach over head  PARTICIPATION LIMITATIONS: cleaning, community activity, and yard work  PERSONAL FACTORS: Age, Behavior pattern, Past/current experiences, Time since onset of injury/illness/exacerbation, and 3+ comorbidities: anemia, osteoporosis, history of cancer, cataracts, HTN, prior falls are also affecting patient's functional outcome.   REHAB POTENTIAL: Fair lacks compliance, high pain levels, multiple comorbidities   CLINICAL DECISION MAKING: Evolving/moderate complexity  EVALUATION COMPLEXITY: Moderate   GOALS: Goals reviewed with patient? Yes  SHORT TERM GOALS: Target date: 03/07/2024 Patient will show compliance with initial HEP. Baseline: Goal status: GOAL MET, 02/29/2024  2.  Patient will report pain levels no greater than 6/10 in order to show an improvement in overall quality of life. Baseline:  Goal status: goal ongoing, 02/29/2024   LONG TERM GOALS: Target date: 04/18/2024  Patient will be independent with final HEP in order to maintain and progress upon functional gains made within PT. Baseline:  Goal status: on going  03/11/2024  2.  Patient will report pain levels no greater than 5/10 in order to show an improvement in overall quality of  life. Baseline:  Goal status: on going 03/11/2024  3.  Patient will increase PSFS to at least 6.2 in order to show a significant improvement in subjective disability. Baseline:  Goal status: on going 03/11/2024  4.  Patient will perform 5xSTS to at least 12s in order to decrease risk of falls.  Baseline:  Goal status: on going 03/11/2024  5.  Patient will improve Rt shoulder flexion and abduction to at least 90deg to improve overall functional mobility.  Baseline:  Goal status: on going 03/11/2024  6.  Patient will increase bilat hip flexion strength to at least 4/5 in order to improve biomechanics with functional mobility. Baseline:  Goal status: on going 03/11/2024   PLAN:  PT FREQUENCY: 1-2x/week  PT DURATION: 10 weeks  PLANNED INTERVENTIONS: 97164- PT Re-evaluation, 97750- Physical Performance Testing, 97110-Therapeutic exercises, 97530- Therapeutic activity, [id number]- Neuromuscular re-education, 97535- Self Care, [id number]- Manual therapy, [id number]- Gait training, [phone]- Orthotic Initial, [id number]- Orthotic/Prosthetic subsequent, [phone]- Canalith repositioning, [phone]- Aquatic Therapy, [phone]- Electrical stimulation (unattended), [id number]- Electrical stimulation (manual), [id number]- Vasopneumatic device, [id number]- Ultrasound, [id number]- Ionotophoresis 4mg /ml Dexamethasone , 79439 (1-2 muscles), [id number] (3+ muscles)- Dry Needling, Patient/Family education, Balance training, Stair training, Taping, Joint mobilization, Spinal mobilization, Vestibular training, DME instructions, Cryotherapy, and Moist heat  PLAN FOR NEXT SESSION:  Strengthening as able.    Ozell Silvan, PT, DPT, OCS, ATC 03/11/2024  10:45 AM

## 2024-03-12 ENCOUNTER — Encounter

## 2024-03-13 ENCOUNTER — Encounter: Admitting: Rehabilitative and Restorative Service Providers"

## 2024-03-13 NOTE — Therapy (Signed)
 OUTPATIENT OCCUPATIONAL THERAPY ORTHO EVALUATION AND DISCHARGE NOTE  Patient Name: Jody [name] MRN: [id number] DOB:[birth year], 83 y.o., female Today's Date: 03/14/2024  REFERRING PROVIDER: Arlinda Buster, MD   END OF SESSION:   Past Medical History:  Diagnosis Date   Anemia, unspecified    on meds   B12 deficiency due to diet    Basal cell carcinoma    LEFT UPPER ARM SUP. TX=CX3 5FU   Cataract    bilateral sx   Cramp of limb    Edema of both legs    Encounter for long-term (current) use of other medications    Hypertension    on meds   Lumbago    Melanoma (HCC) 10/18/2010   RIGHT POST SHOULDER =MOHS   Obesity    Osteoarthrosis, unspecified whether generalized or localized, unspecified site    generalized   Restless legs syndrome (RLS)    SCC (squamous cell carcinoma) 07/03/2017   RIGHT CHEST CX3 5FU   SCC (squamous cell carcinoma) 01/28/2019   LEFT FOREARM CX3 5FU   SCC (squamous cell carcinoma) [id number]   RIGHT FOREHEAD CX3 5FU   SCC (squamous cell carcinoma) 07/03/2017   RIGHT FOREARM CX3 5FU   SCCA (squamous cell carcinoma) of skin 04/08/2020   Right Anterior Mandible (in situ)(CX35FU)   SCCA (squamous cell carcinoma) of skin 04/08/2020   Left Supraorbital Region (Keratoacanthoma)   Senile osteoporosis    Squamous cell carcinoma of skin 01/30/2017   RIGHT JAWLINE TX WITH BX   Past Surgical History:  Procedure Laterality Date   CARPAL TUNNEL RELEASE Left 03/08/2024   Procedure: RELEASE, CARPAL TUNNEL, ENDOSCOPIC;  Surgeon: Arlinda Buster, MD;  Location: [facility];  Service: Orthopedics;  Laterality: Left;   CATARACT EXTRACTION W/ INTRAOCULAR LENS  IMPLANT, BILATERAL  04/2014   COLONOSCOPY     EYE SURGERY Right 04/28/2016   Macular   LEFT HEART CATH AND CORONARY ANGIOGRAPHY N/A 05/15/2023   Procedure: LEFT HEART CATH AND CORONARY ANGIOGRAPHY;  Surgeon: Ladona Heinz, MD;  Location: [facility];  Service: Cardiovascular;   Laterality: N/A;   SPINE SURGERY  2005   STOMACH SURGERY  1981   stapleing    TONSILLECTOMY     UPPER GASTROINTESTINAL ENDOSCOPY     VITRECTOMY Right    with membrane peeling for a macular pucker   Patient Active Problem List   Diagnosis Date Noted   Left carpal tunnel syndrome 03/08/2024   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   Neuropathy 07/31/2023   Pure hypercholesterolemia 11/14/2019   Gastroesophageal reflux disease 02/04/2019   Lumbar back pain with radiculopathy affecting left lower extremity 04/11/2016   Cervical spondylosis without myelopathy 06/08/2015   Absolute anemia 05/16/2014   Primary osteoarthritis involving multiple joints 05/16/2014   Essential hypertension 09/03/2012   Senile osteoporosis    B12 deficiency due to diet      ONSET DATE:  DOS 03/08/24  REFERRING DIAG: G56.02 (ICD-10-CM) - Carpal tunnel syndrome, left upper limb   THERAPY DIAG:     Localized edema  Stiffness of left hand, not elsewhere classified  Rationale for Evaluation and Treatment: Rehabilitation  SUBJECTIVE:   SUBJECTIVE STATEMENT: The patient states hx of paresthesia and pain in their hand and subsequent surgical release of the carpal tunnel. Now the patient states having some lingering paresthesia, stiffness, decreased ability to make a fist and perform I/ADLs.     PERTINENT HISTORY: The patient is now approx 6 days s/p Lt hand  endo CTR.   PRECAUTIONS: None relative to this evaluation and episode of care.   RED FLAGS: None   WEIGHT BEARING RESTRICTIONS: Yes: caution with weightbearing for the next 4-6 weeks, recommended less than 5lbs for next 2 weeks with affected hand  PAIN:  Are you having pain? None at all now, just feels lingering numbness   FALLS: Has patient fallen in last 6 months? No, not a fall risk  PLOF: Independent with I/ADLs  PATIENT GOALS: To improve motion, function with affected surgical hand  NEXT MD VISIT:  PRN    OBJECTIVE MEASURES:   ADLs: Overall ADLs: States decreased ability to grab, hold household objects, pain and difficulty to open containers, perform FMS tasks (manipulate fasteners on clothing).     UPPER EXTREMITY ROM:     A/ROM Left eval  Wrist flexion 72  Wrist extension 57  (Blank rows = not tested)                    Hand A/ROM Left eval  Full Fist Ability (or Gap to Distal Palmar Crease) Middle finger touches palm, other fingers are arthritic and stiff, same as pre-op  Thumb Opposition  (Kapandji Scale)  6/10 WFL  (Blank rows = not tested)   HAND STRENGTH & FUNCTION: Eval: Observed weakness in affected hand/arm, grossly 3-/5 MMT, but specific gripping and resistance training contraindicated today. Also at least mild observed coordination impairments with affected hand/arm due to stiffness and soreness. These deficits are expected to improve with HEP and recommendations.    COORDINATION: Eval: Mild observed coordination impairments with surgical hand, as seen by pain,stiffness, etc. Expected to improve with HEP and recommendations.   SENSATION: Eval:  Light touch mildly diminished especially through sx area. Expected to improve with HEP and recommendations.   EDEMA:   Eval:  Mildly swollen in surgical hand today.  Expected to improve with HEP and recommendations.   COGNITION: Eval: Overall cognitive status: WFL for evaluation today   OBSERVATIONS:   Eval: Surgical site is clean and no overt signs of infection, no drainage, signs of dehiscence, etc.  Tenderness and swelling is within normal limits for post-op timeframe.     TODAY'S TREATMENT:  Post-evaluation treatment:   The patient was given safety information for managing post-op wound, including not to soak wound, to keep clean and dry, to start with gentle light touch for desensitization and eventually more aggressive scar mobilizations approx 5-7 days after stitches are removed and if the wound is  closed. The patient should monitor for signs of infection.  The patient was supplied with compressive gauze to help with swelling as needed.  The patient should contact the surgeon with any concerns immediately.   The patient should also avoid any strong gripping, push, pull, weight bearing or repetitive motion for the next 4-6 weeks.  The patient should not be doing painful activities.  The patient should not rest on their palm, keep the wrist bent for long time periods, or sleep on their hand. After a month, the patient can progressively return to all light, normal activities. Sports and heavy weight lifting should be withheld for a total of 3 months.   The patient was also educated (explanation and demonstration) on the following home exercise program including tolerable range of motion, gentle passive range of motion, scar care, progressive desensitization, prevention of soft tissue contractures, etc. The patient states understanding all directions and feels comfortable with doing this at home, self-management, and following up with  the surgeon as needed/scheduled.    CTR Exercises  - Turn [employer] Facing Up & Down  - 4 x daily - 15 reps - Bend and Pull Back Wrist SLOWLY  - 4 x daily - 15 reps - Tendon Glides  - 4 x daily - 5 reps - 3 second hold - Median Nerve Flossing  - 3 x daily - 5 reps - Wrist Prayer Stretch  - 4 x daily - 3 reps - 15 second hold - Full finger stretches   - 4 x daily - 3 reps - 15 second hold Patient Education - Scar Massage   PATIENT EDUCATION: Education details: See tx section above for details  Person educated: Patient Education method: Verbal Instruction, Teach back, Handouts  Education comprehension: States and demonstrates understanding   HOME EXERCISE PROGRAM: See tx section above for details    GOALS: Goals reviewed with patient? Yes   SHORT TERM GOALS: (STG required if POC>30 days) Target Date: 03/14/24  1.  Pt will demo/state understanding of  initial HEP and therapist recommendations to improve pain levels, improve motions and ability and eventually return to normal activities.   Goal status: MET    ASSESSMENT:  CLINICAL IMPRESSION: Patient is a 83 y.o. female who was seen today for occupational therapy evaluation for swelling, pain, weakness and decreased functional ability following carpal tunnel release procedure. The patient is appropriate for OT rehab services and benefited from treatment today. The patient got copious education/treatment today for self-care, wound management, exercises and how to transition to normal activities in the next 4-6 weeks. The patient agrees that they can manage these recommendations independently, and should not need to return for follow up visits. The patient should follow up with the surgeon with any concerns, and could possibly return to therapy, if needed, with a new order.  The patient will discharge therapy treatment after this visit.     PERFORMANCE DEFICITS: in functional skills including ADLs, IADLs, coordination, dexterity, sensation, edema, ROM, strength, pain, fascial restrictions, flexibility, Fine motor control, body mechanics, endurance, decreased knowledge of precautions, wound, and UE functional use, cognitive skills including problem solving and safety awareness, and psychosocial skills including coping strategies, environmental adaptation, and habits.   IMPAIRMENTS: are limiting patient from ADLs, IADLs, rest and sleep, leisure, and social participation.   COMORBIDITIES: may have co-morbidities  that affects occupational performance. Patient will benefit from skilled OT to address above impairments and improve overall function.  MODIFICATION OR ASSISTANCE TO COMPLETE EVALUATION: No modification of tasks or assist necessary to complete an evaluation.  OT OCCUPATIONAL PROFILE AND HISTORY: Problem focused assessment: Including review of records relating to presenting  problem.  CLINICAL DECISION MAKING: LOW - limited treatment options, no task modification necessary  REHAB POTENTIAL: Excellent  EVALUATION COMPLEXITY: Low      PLAN:  OT FREQUENCY: one time visit  OT DURATION: 1 sessions  PLANNED INTERVENTIONS: self care/ADL training, therapeutic exercise, therapeutic activity, neuromuscular re-education, manual therapy, scar mobilization, passive range of motion, splinting, ultrasound, fluidotherapy, compression bandaging, moist heat, cryotherapy, contrast bath, patient/family education, energy conservation, coping strategies training, and Re-evaluation  RECOMMENDED OTHER SERVICES: none now   CONSULTED AND AGREED WITH PLAN OF CARE: Patient  PLAN FOR NEXT SESSION:   N/A    Melvenia Ada, OTR/L, CHT 03/14/2024, 1:48 PM

## 2024-03-13 NOTE — Therapy (Signed)
 OUTPATIENT PHYSICAL THERAPY REATMENT   Patient Name: Jody [name] MRN: [id number] DOB:1941-01-10, 83 y.o., female Today's Date: 03/14/2024  END OF SESSION:  PT End of Session - 03/14/24 1020     Visit Number 5    Number of Visits 20    Date for Recertification  04/18/24    Authorization Type MEDICARE AND [employer]    Progress Note Due on Visit 10    PT Start Time 1020    PT Stop Time 1058    PT Time Calculation (min) 38 min    Activity Tolerance Patient limited by pain    Behavior During Therapy WFL for tasks assessed/performed              Past Medical History:  Diagnosis Date   Anemia, unspecified    on meds   B12 deficiency due to diet    Basal cell carcinoma    LEFT UPPER ARM SUP. TX=CX3 5FU   Cataract    bilateral sx   Cramp of limb    Edema of both legs    Encounter for long-term (current) use of other medications    Hypertension    on meds   Lumbago    Melanoma (HCC) 10/18/2010   RIGHT POST SHOULDER =MOHS   Obesity    Osteoarthrosis, unspecified whether generalized or localized, unspecified site    generalized   Restless legs syndrome (RLS)    SCC (squamous cell carcinoma) 07/03/2017   RIGHT CHEST CX3 5FU   SCC (squamous cell carcinoma) 01/28/2019   LEFT FOREARM CX3 5FU   SCC (squamous cell carcinoma) [id number]   RIGHT FOREHEAD CX3 5FU   SCC (squamous cell carcinoma) 07/03/2017   RIGHT FOREARM CX3 5FU   SCCA (squamous cell carcinoma) of skin 04/08/2020   Right Anterior Mandible (in situ)(CX35FU)   SCCA (squamous cell carcinoma) of skin 04/08/2020   Left Supraorbital Region (Keratoacanthoma)   Senile osteoporosis    Squamous cell carcinoma of skin 01/30/2017   RIGHT JAWLINE TX WITH BX   Past Surgical History:  Procedure Laterality Date   CARPAL TUNNEL RELEASE Left 03/08/2024   Procedure: RELEASE, CARPAL TUNNEL, ENDOSCOPIC;  Surgeon: Arlinda Buster, MD;  Location: [facility];  Service: Orthopedics;  Laterality: Left;    CATARACT EXTRACTION W/ INTRAOCULAR LENS  IMPLANT, BILATERAL  04/2014   COLONOSCOPY     EYE SURGERY Right 04/28/2016   Macular   LEFT HEART CATH AND CORONARY ANGIOGRAPHY N/A 05/15/2023   Procedure: LEFT HEART CATH AND CORONARY ANGIOGRAPHY;  Surgeon: Ladona Heinz, MD;  Location: [facility];  Service: Cardiovascular;  Laterality: N/A;   SPINE SURGERY  2005   STOMACH SURGERY  1981   stapleing    TONSILLECTOMY     UPPER GASTROINTESTINAL ENDOSCOPY     VITRECTOMY Right    with membrane peeling for a macular pucker   Patient Active Problem List   Diagnosis Date Noted   Left carpal tunnel syndrome 03/08/2024   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   Neuropathy 07/31/2023   Pure hypercholesterolemia 11/14/2019   Gastroesophageal reflux disease 02/04/2019   Lumbar back pain with radiculopathy affecting left lower extremity 04/11/2016   Cervical spondylosis without myelopathy 06/08/2015   Absolute anemia 05/16/2014   Primary osteoarthritis involving multiple joints 05/16/2014   Essential hypertension 09/03/2012   Senile osteoporosis    B12 deficiency due to diet     PCP: Harlene MARLA An, NP  REFERRING PROVIDER: Lonell Sprang,  DO  REFERRING DIAG: M17.0 (ICD-10-CM) - Bilateral primary osteoarthritis of knee M19.011,M19.012 (ICD-10-CM) - Primary osteoarthritis of shoulders, bilateral M12.811,M12.812 (ICD-10-CM) - Rotator cuff arthropathy of both shoulders R29.898 (ICD-10-CM) - Leg weakness, bilateral  THERAPY DIAG:  Chronic pain of both knees  Muscle weakness (generalized)  Chronic pain of both shoulders  Other abnormalities of gait and mobility  Abnormal posture  Unsteadiness on feet  Rationale for Evaluation and Treatment: Rehabilitation  ONSET DATE: 01/28/2024 is when it worsened   SUBJECTIVE:   SUBJECTIVE STATEMENT: Patient reported that she is surprised that she is feeling the improvements that she is because she hasn't been  able to get in the pool since Thanksgiving week.     PERTINENT HISTORY: Patient reports that she has chronic bilat knee (Lt>Rt) pain and wears a brace on Lt knee. Patient endorses that she fell and fractured Lt patella in 2017 which is why it still hurts. Patient also endorses one other fall in 2018 out of her attic which led to a hospitalization and cervical fusion. Patient endorses bilat shoulder pain (Rt>Lt) and requires assistance for reaching with Rt shoulder flexion. Patient recently finished a prednisone  regimen which has improved overall symptoms/pain back to her normal. Patient recently began feeling aching/trembling in Lt LE, has been experiencing generalized muscle atrophy and weakness, and requires aquatic therapy at least 4x/wk in order to continue to be able to get out of bed. Patient endorses one other orthopedic surgery in lower back with breakdown of adjacent vertebrae above and below. Patient has severe osteoporosis.   See PMH or personal factors for more in depth comorbidities  PAIN:  NPRS scale: < 5/10 Pain location: shoulder, knees, LEs in general Pain description: consistent, debilitating Aggravating factors: standing, walking, reaching, functional IR  Relieving factors: aquatic therapy   PRECAUTIONS: Fall and high risk for fractures as she is severely osteoporotic   RED FLAGS: Bowel or bladder incontinence: Yes: referral in for a urologist and Cauda equina syndrome: No   WEIGHT BEARING RESTRICTIONS: No  FALLS:  Has patient fallen in last 6 months? No  LIVING ENVIRONMENT: Lives with: lives with their spouse Lives in: House/apartment Stairs: Yes: External: 1 from carport to inside the house steps; none; steps into the attic with pulldown rail which she goes into often Has following equipment at home: Single point cane, Environmental Consultant - 2 wheeled, Environmental Consultant - 4 wheeled, Marine Scientist  OCCUPATION: retired   PLOF: Independent and uses a Recruitment Consultant  PATIENT GOALS:  to get a little stronger, to get out of a chair without making a spectacle   NEXT MD VISIT: 02/19/2024 with Dr. Burnetta   OBJECTIVE:  Note: Objective measures were completed at Evaluation unless otherwise noted.  DIAGNOSTIC FINDINGS:  X-rays demonstrate severe bone-on-bone collapse of the lateral tibiofemoral joint space. There is notable subchondral sclerosis of the lateral femoral condyle and lateral tibial plateau. The knee is fall into a valgus fashion bilaterally. Lateral view of the left knee shows healed fragmentation of the patella likely from prior fracture. No acute fracture or acute bony abnormality otherwise noted.   PATIENT SURVEYS:  PSFS: THE PATIENT SPECIFIC FUNCTIONAL SCALE  Place score of 0-10 (0 = unable to perform activity and 10 = able to perform activity at the same level as before injury or problem)  Activity Date:  02/08/2024 03/11/2024   Walking distance  5 7   2. Getting up from a chair  5 6   3. Using arms (reaching) 3 7  4.  Balance and mobility  5 5   5. Walking with a purpose 3 2   Total Score 4.2 5.4     Total Score = Sum of activity scores/number of activities  Minimally Detectable Change: 3 points (for single activity); 2 points (for average score)  [facility] (nd). The Patient Specific Functional Scale . Retrieved from [email or web address]   COGNITION: Eval:  Overall cognitive status: Within functional limits for tasks assessed     SENSATION: Eval: Light touch: WFL Patient endorses highly sensitive to touch on bilat LE   POSTURE:  Eval: rounded shoulders, forward head, and increased thoracic kyphosis   LOWER EXTREMITY ROM:  Active ROM Right Eval 02/08/2024 Left Eval 02/08/2024  Hip flexion    Hip extension    Hip abduction    Hip adduction    Hip internal rotation    Hip external rotation    Knee flexion (seated) [facility] WFL  Knee extension (seated) WFL ~90% WFL   Ankle dorsiflexion    Ankle plantarflexion    Ankle inversion    Ankle eversion     (Blank rows = not tested)  UE ROM: 02/08/2024, seated AROM  Shoulder flexion: Lt: WFL Rt: 69deg Shoulder abduction: Lt: WFL, but painful around 90deg Rt: 60deg Shoulder functional IR: Lt: T12 Rt: iliac crest   Shoulder functional ER: Lt: T2 Rt: UT  LOWER EXTREMITY MMT: painful with MMT   MMT Right Eval 02/08/2024 Left Eval 02/08/2024 Right 03/11/2024 Left 03/11/2024  Hip flexion (seated) 3+/5 3+/5 4/5 4/5  Hip extension      Hip abduction      Hip adduction      Hip internal rotation      Hip external rotation      Knee flexion (seated) 4-/5 4-/5    Knee extension (seated) 4/5 4-/5    Ankle dorsiflexion       Ankle plantarflexion      Ankle inversion      Ankle eversion       (Blank rows = not tested)   FUNCTIONAL TESTS:  Eval: 5 times sit to stand: 13s from arm chair using bilat UEs after first stand   SPECIAL TESTS: Slump 02/08/2024: positive on Rt side   GAIT: Eval:  Distance walked: not objectively measured  Assistive device utilized: SPC/walking stick Level of assistance: supervision  Comments: antalgic and decreased speed                   TREATMENT         DATE:  03/14/2024 TherEx:  Nustep level 3 for 6 minutes  Slant board gastroc stretch 3x30s  Calf raises into toe raises 1x20 with unilat UE use   TherAct:  Fwd step ups with 4 step 2x8 leading with each LE and decreasing UE use on // bars with second trial  One instance of posterior LOB secondary to catching toe on step during step down with PT assist to min A for balance  Ambulation with SPC and change in speed in order to challenge walking with a purpose 3x20' with SBA-CGA  PT discussed confidence, visual cues, and how to decrease chances of falling  Lateral step ups with 4 step 1x8 each side with increased UE use on // bars  PT discussing importance of performing fwd and lateral step ups with patient for  strength purposes, but also for if/when she may need to rely on one side vs the other due to pain, etc.  Self-Care: PT discussed POC, how to continue increasing challenges at home, and returning to pool    TREATMENT         DATE:  03/11/2024 Therex: Nustep Lvl 5 for 6.5 mins LE only  Incline gastroc stretch 30 sec x 3 bilaterally   Self Care Advice given on hydration and stretching to help cramping.  Also mentioned possible electrolyte drink (watch sugar content).   TherActivity (to improve transfers, stairs, reaching ) Step up forward 4 inch step with hand assist on bar x 10 bilaterally with slow lowering  Sit to stand to sit 18 inch chair s UE assist with slow sitting 2 x 5  Seated wand AAROM flexion to tolerance. 2 lb 2 x 10 (adjusted hand position for Lt to help more in concentric movement.   Neuro Re-ed Modified tandem stance 30 sec x 2 with SBA Alternating heel/toe lifts x 20 each way with SBA , cues for techniques                                                                                                                               TREATMENT         DATE:  03/05/2024 TherEx: Nustep level 4 for 8 minutes with bilat LE only  Seated shoulder flexion with 2# bar 2x12  Seated shoulder abduction 1x20  Seated ER with scap retraction 1x20  Calf raises 1x20   TherAct:  Sit<>stands with 5# KB 3x5 (mat table at 19.5)  Step ups with 4 step 1x8 leading with each foot; intermittent UE use for stability purposes   TREATMENT         DATE: 02/29/2024 TherEx:  Nustep level 3 for 8 minutes with bilat UE and LE  Seated LAQ with 1.5# weights 2x12   Standing hamstring curls with UE support for appropriate form 1x6 each side; PT discussing importance of weight shifting onto opposite leg   TherAct:  Reaching to cabinet with 1# weight 2x6 each side ; increased difficulty on Rt side  Supine shoulder flexion AAROM with PVC 1x10     PATIENT EDUCATION:  Education details: HEP, POC,  aquatic PT, sciatica  Person educated: Patient Education method: Explanation, Demonstration, Tactile cues, Verbal cues, and Handouts Education comprehension: verbalized understanding, returned demonstration, verbal cues required, tactile cues required, and needs further education  HOME EXERCISE PROGRAM: Access Code: [email or web address] URL: [email or web address] Date: 02/08/2024 Prepared by: Susannah Daring  Exercises - Mini Squat with Counter Support  - 1 x daily - 7 x weekly - 2 sets - 5 reps - Seated Long Arc Quad  - 1 x daily - 7 x weekly - 2 sets - 10 reps - 2seconds hold - Standing Knee Flexion AROM with Chair Support  - 1 x daily - 7 x weekly - 2 sets - 10 reps - 2seconds  hold - AAROM/AROM Shoulder Flexion at Cabinet  - 1 x daily - 7 x weekly - 2 sets -  10 reps  ASSESSMENT:  CLINICAL IMPRESSION: Patient arrived to session noting improving symptoms overall, though continued difficulty with Lt knee which is chronic. Patient tolerated all activities this date and was able to use UE less when performing step ups. Patient will continue to benefit from skilled PT.  OBJECTIVE IMPAIRMENTS: Abnormal gait, decreased activity tolerance, decreased balance, decreased coordination, decreased endurance, decreased mobility, difficulty walking, decreased ROM, decreased strength, improper body mechanics, postural dysfunction, and pain.   ACTIVITY LIMITATIONS: carrying, lifting, bending, sitting, standing, squatting, stairs, transfers, continence, dressing, and reach over head  PARTICIPATION LIMITATIONS: cleaning, community activity, and yard work  PERSONAL FACTORS: Age, Behavior pattern, Past/current experiences, Time since onset of injury/illness/exacerbation, and 3+ comorbidities: anemia, osteoporosis, history of cancer, cataracts, HTN, prior falls are also affecting patient's functional outcome.   REHAB POTENTIAL: Fair lacks compliance, high pain levels, multiple comorbidities   CLINICAL  DECISION MAKING: Evolving/moderate complexity  EVALUATION COMPLEXITY: Moderate   GOALS: Goals reviewed with patient? Yes  SHORT TERM GOALS: Target date: 03/07/2024 Patient will show compliance with initial HEP. Baseline: Goal status: GOAL MET, 02/29/2024  2.  Patient will report pain levels no greater than 6/10 in order to show an improvement in overall quality of life. Baseline:  Goal status: goal ongoing, 02/29/2024   LONG TERM GOALS: Target date: 04/18/2024  Patient will be independent with final HEP in order to maintain and progress upon functional gains made within PT. Baseline:  Goal status: on going 03/11/2024  2.  Patient will report pain levels no greater than 5/10 in order to show an improvement in overall quality of life. Baseline:  Goal status: on going 03/11/2024  3.  Patient will increase PSFS to at least 6.2 in order to show a significant improvement in subjective disability. Baseline:  Goal status: on going 03/11/2024  4.  Patient will perform 5xSTS to at least 12s in order to decrease risk of falls.  Baseline:  Goal status: on going 03/11/2024  5.  Patient will improve Rt shoulder flexion and abduction to at least 90deg to improve overall functional mobility.  Baseline:  Goal status: on going 03/11/2024  6.  Patient will increase bilat hip flexion strength to at least 4/5 in order to improve biomechanics with functional mobility. Baseline:  Goal status: on going 03/11/2024   PLAN:  PT FREQUENCY: 1-2x/week  PT DURATION: 10 weeks  PLANNED INTERVENTIONS: [id number]- PT Re-evaluation, 97750- Physical Performance Testing, 97110-Therapeutic exercises, 97530- Therapeutic activity, [id number]- Neuromuscular re-education, 97535- Self Care, [id number]- Manual therapy, [id number]- Gait training, [phone]- Orthotic Initial, [phone]- Orthotic/Prosthetic subsequent, [phone]- Canalith repositioning, [phone]- Aquatic Therapy, [phone]- Electrical stimulation (unattended), [phone]- Electrical  stimulation (manual), [id number]- Vasopneumatic device, [id number]- Ultrasound, [id number]- Ionotophoresis 4mg /ml Dexamethasone , [id number] (1-2 muscles), [id number] (3+ muscles)- Dry Needling, Patient/Family education, Balance training, Stair training, Taping, Joint mobilization, Spinal mobilization, Vestibular training, DME instructions, Cryotherapy, and Moist heat  PLAN FOR NEXT SESSION:   Strengthening as able.    Susannah Daring, PT, DPT 03/14/2024 11:13 AM

## 2024-03-14 ENCOUNTER — Encounter: Payer: Self-pay | Admitting: Rehabilitative and Restorative Service Providers"

## 2024-03-14 ENCOUNTER — Ambulatory Visit

## 2024-03-14 ENCOUNTER — Ambulatory Visit: Admitting: Rehabilitative and Restorative Service Providers"

## 2024-03-14 DIAGNOSIS — R600 Localized edema: Secondary | ICD-10-CM

## 2024-03-14 DIAGNOSIS — M25642 Stiffness of left hand, not elsewhere classified: Secondary | ICD-10-CM | POA: Diagnosis not present

## 2024-03-14 DIAGNOSIS — M25562 Pain in left knee: Secondary | ICD-10-CM | POA: Diagnosis not present

## 2024-03-14 DIAGNOSIS — R293 Abnormal posture: Secondary | ICD-10-CM | POA: Diagnosis not present

## 2024-03-14 DIAGNOSIS — R2681 Unsteadiness on feet: Secondary | ICD-10-CM | POA: Diagnosis not present

## 2024-03-14 DIAGNOSIS — M25561 Pain in right knee: Secondary | ICD-10-CM

## 2024-03-14 DIAGNOSIS — R2689 Other abnormalities of gait and mobility: Secondary | ICD-10-CM

## 2024-03-14 DIAGNOSIS — M25511 Pain in right shoulder: Secondary | ICD-10-CM | POA: Diagnosis not present

## 2024-03-14 DIAGNOSIS — M6281 Muscle weakness (generalized): Secondary | ICD-10-CM | POA: Diagnosis not present

## 2024-03-14 DIAGNOSIS — G8929 Other chronic pain: Secondary | ICD-10-CM | POA: Diagnosis not present

## 2024-03-14 DIAGNOSIS — M25512 Pain in left shoulder: Secondary | ICD-10-CM

## 2024-03-18 NOTE — Therapy (Signed)
 " OUTPATIENT PHYSICAL THERAPY TREATMENT   Patient Name: Jody [name] MRN: [id number] DOB:08/28/1940, 83 y.o., female Today's Date: 03/19/2024  END OF SESSION:  PT End of Session - 03/19/24 1016     Visit Number 6    Number of Visits 20    Date for Recertification  04/18/24    Authorization Type MEDICARE AND [employer]    Progress Note Due on Visit 10    PT Start Time 1016    PT Stop Time 1058    PT Time Calculation (min) 42 min    Activity Tolerance Patient limited by pain    Behavior During Therapy WFL for tasks assessed/performed           Past Medical History:  Diagnosis Date   Anemia, unspecified    on meds   B12 deficiency due to diet    Basal cell carcinoma    LEFT UPPER ARM SUP. TX=CX3 5FU   Cataract    bilateral sx   Cramp of limb    Edema of both legs    Encounter for long-term (current) use of other medications    Hypertension    on meds   Lumbago    Melanoma (HCC) 10/18/2010   RIGHT POST SHOULDER =MOHS   Obesity    Osteoarthrosis, unspecified whether generalized or localized, unspecified site    generalized   Restless legs syndrome (RLS)    SCC (squamous cell carcinoma) 07/03/2017   RIGHT CHEST CX3 5FU   SCC (squamous cell carcinoma) 01/28/2019   LEFT FOREARM CX3 5FU   SCC (squamous cell carcinoma) [id number]   RIGHT FOREHEAD CX3 5FU   SCC (squamous cell carcinoma) 07/03/2017   RIGHT FOREARM CX3 5FU   SCCA (squamous cell carcinoma) of skin 04/08/2020   Right Anterior Mandible (in situ)(CX35FU)   SCCA (squamous cell carcinoma) of skin 04/08/2020   Left Supraorbital Region (Keratoacanthoma)   Senile osteoporosis    Squamous cell carcinoma of skin 01/30/2017   RIGHT JAWLINE TX WITH BX   Past Surgical History:  Procedure Laterality Date   CARPAL TUNNEL RELEASE Left 03/08/2024   Procedure: RELEASE, CARPAL TUNNEL, ENDOSCOPIC;  Surgeon: Arlinda Buster, MD;  Location: [facility];  Service: Orthopedics;  Laterality: Left;    CATARACT EXTRACTION W/ INTRAOCULAR LENS  IMPLANT, BILATERAL  04/2014   COLONOSCOPY     EYE SURGERY Right 04/28/2016   Macular   LEFT HEART CATH AND CORONARY ANGIOGRAPHY N/A 05/15/2023   Procedure: LEFT HEART CATH AND CORONARY ANGIOGRAPHY;  Surgeon: Ladona Heinz, MD;  Location: [facility];  Service: Cardiovascular;  Laterality: N/A;   SPINE SURGERY  2005   STOMACH SURGERY  1981   stapleing    TONSILLECTOMY     UPPER GASTROINTESTINAL ENDOSCOPY     VITRECTOMY Right    with membrane peeling for a macular pucker   Patient Active Problem List   Diagnosis Date Noted   Left carpal tunnel syndrome 03/08/2024   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   Neuropathy 07/31/2023   Pure hypercholesterolemia 11/14/2019   Gastroesophageal reflux disease 02/04/2019   Lumbar back pain with radiculopathy affecting left lower extremity 04/11/2016   Cervical spondylosis without myelopathy 06/08/2015   Absolute anemia 05/16/2014   Primary osteoarthritis involving multiple joints 05/16/2014   Essential hypertension 09/03/2012   Senile osteoporosis    B12 deficiency due to diet     PCP: Harlene MARLA An, NP  REFERRING PROVIDER: Lonell Sprang, DO  REFERRING DIAG: M17.0 (ICD-10-CM) - Bilateral primary osteoarthritis of knee M19.011,M19.012 (ICD-10-CM) - Primary osteoarthritis of shoulders, bilateral M12.811,M12.812 (ICD-10-CM) - Rotator cuff arthropathy of both shoulders R29.898 (ICD-10-CM) - Leg weakness, bilateral  THERAPY DIAG:  Chronic pain of both knees  Muscle weakness (generalized)  Chronic pain of both shoulders  Other abnormalities of gait and mobility  Abnormal posture  Unsteadiness on feet  Rationale for Evaluation and Treatment: Rehabilitation  ONSET DATE: 01/28/2024 is when it worsened   SUBJECTIVE:   SUBJECTIVE STATEMENT: Patient endorsing continued improvement in overall symptoms, though difficulty noted with Rt shoulder.  PERTINENT  HISTORY: Patient reports that she has chronic bilat knee (Lt>Rt) pain and wears a brace on Lt knee. Patient endorses that she fell and fractured Lt patella in 2017 which is why it still hurts. Patient also endorses one other fall in 2018 out of her attic which led to a hospitalization and cervical fusion. Patient endorses bilat shoulder pain (Rt>Lt) and requires assistance for reaching with Rt shoulder flexion. Patient recently finished a prednisone  regimen which has improved overall symptoms/pain back to her normal. Patient recently began feeling aching/trembling in Lt LE, has been experiencing generalized muscle atrophy and weakness, and requires aquatic therapy at least 4x/wk in order to continue to be able to get out of bed. Patient endorses one other orthopedic surgery in lower back with breakdown of adjacent vertebrae above and below. Patient has severe osteoporosis.   See PMH or personal factors for more in depth comorbidities  PAIN:  NPRS scale: 3/10 Pain location: shoulder, knees, LEs in general Pain description: consistent, debilitating Aggravating factors: standing, walking, reaching, functional IR  Relieving factors: aquatic therapy   PRECAUTIONS: Fall and high risk for fractures as she is severely osteoporotic   RED FLAGS: Bowel or bladder incontinence: Yes: referral in for a urologist and Cauda equina syndrome: No   WEIGHT BEARING RESTRICTIONS: No  FALLS:  Has patient fallen in last 6 months? No  LIVING ENVIRONMENT: Lives with: lives with their spouse Lives in: House/apartment Stairs: Yes: External: 1 from carport to inside the house steps; none; steps into the attic with pulldown rail which she goes into often Has following equipment at home: Single point cane, Environmental Consultant - 2 wheeled, Environmental Consultant - 4 wheeled, Marine Scientist  OCCUPATION: retired   PLOF: Independent and uses a Recruitment Consultant  PATIENT GOALS: to get a little stronger, to get out of a chair without making a  spectacle   NEXT MD VISIT: 02/19/2024 with Dr. Burnetta   OBJECTIVE:  Note: Objective measures were completed at Evaluation unless otherwise noted.  DIAGNOSTIC FINDINGS:  X-rays demonstrate severe bone-on-bone collapse of the lateral tibiofemoral joint space. There is notable subchondral sclerosis of the lateral femoral condyle and lateral tibial plateau. The knee is fall into a valgus fashion bilaterally. Lateral view of the left knee shows healed fragmentation of the patella likely from prior fracture. No acute fracture or acute bony abnormality otherwise noted.   PATIENT SURVEYS:  PSFS: THE PATIENT SPECIFIC FUNCTIONAL SCALE  Place score of 0-10 (0 = unable to perform activity and 10 = able to perform activity at the same level as before injury or problem)  Activity Date:  02/08/2024 03/11/2024   Walking distance  5 7   2. Getting up from a chair  5 6   3. Using arms (reaching) 3 7   4.  Balance and mobility  5 5   5. Walking with a purpose 3 2   Total  Score 4.2 5.4     Total Score = Sum of activity scores/number of activities  Minimally Detectable Change: 3 points (for single activity); 2 points (for average score)  [facility] (nd). The Patient Specific Functional Scale . Retrieved from [email or web address]   COGNITION: Eval:  Overall cognitive status: Within functional limits for tasks assessed     SENSATION: Eval: Light touch: WFL Patient endorses highly sensitive to touch on bilat LE   POSTURE:  Eval: rounded shoulders, forward head, and increased thoracic kyphosis   LOWER EXTREMITY ROM:  Active ROM Right Eval 02/08/2024 Left Eval 02/08/2024  Hip flexion    Hip extension    Hip abduction    Hip adduction    Hip internal rotation    Hip external rotation    Knee flexion (seated) [facility] WFL  Knee extension (seated) WFL ~90% WFL  Ankle dorsiflexion    Ankle plantarflexion    Ankle inversion     Ankle eversion     (Blank rows = not tested)  UE ROM: 02/08/2024, seated AROM  Shoulder flexion: Lt: WFL Rt: 69deg Shoulder abduction: Lt: WFL, but painful around 90deg Rt: 60deg Shoulder functional IR: Lt: T12 Rt: iliac crest   Shoulder functional ER: Lt: T2 Rt: UT  LOWER EXTREMITY MMT: painful with MMT   MMT Right Eval 02/08/2024 Left Eval 02/08/2024 Right 03/11/2024 Left 03/11/2024  Hip flexion (seated) 3+/5 3+/5 4/5 4/5  Hip extension      Hip abduction      Hip adduction      Hip internal rotation      Hip external rotation      Knee flexion (seated) 4-/5 4-/5    Knee extension (seated) 4/5 4-/5    Ankle dorsiflexion       Ankle plantarflexion      Ankle inversion      Ankle eversion       (Blank rows = not tested)   FUNCTIONAL TESTS:  Eval: 5 times sit to stand: 13s from arm chair using bilat UEs after first stand   SPECIAL TESTS: Slump 02/08/2024: positive on Rt side   GAIT: Eval:  Distance walked: not objectively measured  Assistive device utilized: SPC/walking stick Level of assistance: supervision  Comments: antalgic and decreased speed                   TREATMENT         DATE:  03/19/2024 TherEx: Nustep level 3 for 8.5 minutes with bilat LE only  PT discussed HEP progressions at home and adding seated clamshells to HEP   TherAct: (to assist with transfers, stairs, etc.) Sit<>stands in 18 chair 1x10 with focus on lowering  using forward momentum and elbows on thighs for stand Seated clamshells with green TB 2x15  Step ups with 6 step using unilat UE support on // bars 1x10 each LE, 1x6 each LE  Patient performing calf raises into toe raises between sets    TREATMENT         DATE:  03/14/2024 TherEx:  Nustep level 3 for 6 minutes  Slant board gastroc stretch 3x30s  Calf raises into toe raises 1x20 with unilat UE use   TherAct:  Fwd step ups with 4 step 2x8 leading with each LE and decreasing UE use on // bars with second trial  One  instance of posterior LOB secondary to catching toe on step during step down with PT assist to min A for balance  Ambulation with  SPC and change in speed in order to challenge walking with a purpose 3x20' with SBA-CGA  PT discussed confidence, visual cues, and how to decrease chances of falling  Lateral step ups with 4 step 1x8 each side with increased UE use on // bars  PT discussing importance of performing fwd and lateral step ups with patient for strength purposes, but also for if/when she may need to rely on one side vs the other due to pain, etc.   Self-Care: PT discussed POC, how to continue increasing challenges at home, and returning to pool    TREATMENT         DATE:  03/11/2024 Therex: Nustep Lvl 5 for 6.5 mins LE only  Incline gastroc stretch 30 sec x 3 bilaterally   Self Care Advice given on hydration and stretching to help cramping.  Also mentioned possible electrolyte drink (watch sugar content).   TherActivity (to improve transfers, stairs, reaching ) Step up forward 4 inch step with hand assist on bar x 10 bilaterally with slow lowering  Sit to stand to sit 18 inch chair s UE assist with slow sitting 2 x 5  Seated wand AAROM flexion to tolerance. 2 lb 2 x 10 (adjusted hand position for Lt to help more in concentric movement.   Neuro Re-ed Modified tandem stance 30 sec x 2 with SBA Alternating heel/toe lifts x 20 each way with SBA , cues for techniques                                                                                                                               TREATMENT         DATE:  03/05/2024 TherEx: Nustep level 4 for 8 minutes with bilat LE only  Seated shoulder flexion with 2# bar 2x12  Seated shoulder abduction 1x20  Seated ER with scap retraction 1x20  Calf raises 1x20   TherAct:  Sit<>stands with 5# KB 3x5 (mat table at 19.5)  Step ups with 4 step 1x8 leading with each foot; intermittent UE use for stability purposes     PATIENT  EDUCATION:  Education details: HEP, POC, aquatic PT, sciatica  Person educated: Patient Education method: Explanation, Demonstration, Tactile cues, Verbal cues, and Handouts Education comprehension: verbalized understanding, returned demonstration, verbal cues required, tactile cues required, and needs further education  HOME EXERCISE PROGRAM: Access Code: [email or web address] URL: [email or web address] Date: 02/08/2024 Prepared by: Susannah Daring  Exercises - Mini Squat with Counter Support  - 1 x daily - 7 x weekly - 2 sets - 5 reps - Seated Long Arc Quad  - 1 x daily - 7 x weekly - 2 sets - 10 reps - 2seconds hold - Standing Knee Flexion AROM with Chair Support  - 1 x daily - 7 x weekly - 2 sets - 10 reps - 2seconds  hold - AAROM/AROM Shoulder Flexion at Cabinet  - 1 x daily - 7 x weekly - 2 sets -  10 reps  ASSESSMENT:  CLINICAL IMPRESSION: Patient arrived to session noting overall improvement, though having difficulty with Lt wrist and hand following surgery. Patient tolerated all activities this date with minimal-moderate increases in Lt knee pain during step ups that improved following completion of activity. Patient will continue to benefit from skilled PT.  OBJECTIVE IMPAIRMENTS: Abnormal gait, decreased activity tolerance, decreased balance, decreased coordination, decreased endurance, decreased mobility, difficulty walking, decreased ROM, decreased strength, improper body mechanics, postural dysfunction, and pain.   ACTIVITY LIMITATIONS: carrying, lifting, bending, sitting, standing, squatting, stairs, transfers, continence, dressing, and reach over head  PARTICIPATION LIMITATIONS: cleaning, community activity, and yard work  PERSONAL FACTORS: Age, Behavior pattern, Past/current experiences, Time since onset of injury/illness/exacerbation, and 3+ comorbidities: anemia, osteoporosis, history of cancer, cataracts, HTN, prior falls are also affecting patient's functional outcome.    REHAB POTENTIAL: Fair lacks compliance, high pain levels, multiple comorbidities   CLINICAL DECISION MAKING: Evolving/moderate complexity  EVALUATION COMPLEXITY: Moderate   GOALS: Goals reviewed with patient? Yes  SHORT TERM GOALS: Target date: 03/07/2024 Patient will show compliance with initial HEP. Baseline: Goal status: GOAL MET, 02/29/2024  2.  Patient will report pain levels no greater than 6/10 in order to show an improvement in overall quality of life. Baseline:  Goal status: goal ongoing, 02/29/2024   LONG TERM GOALS: Target date: 04/18/2024  Patient will be independent with final HEP in order to maintain and progress upon functional gains made within PT. Baseline:  Goal status: on going 03/11/2024  2.  Patient will report pain levels no greater than 5/10 in order to show an improvement in overall quality of life. Baseline:  Goal status: on going 03/11/2024  3.  Patient will increase PSFS to at least 6.2 in order to show a significant improvement in subjective disability. Baseline:  Goal status: on going 03/11/2024  4.  Patient will perform 5xSTS to at least 12s in order to decrease risk of falls.  Baseline:  Goal status: on going 03/11/2024  5.  Patient will improve Rt shoulder flexion and abduction to at least 90deg to improve overall functional mobility.  Baseline:  Goal status: on going 03/11/2024  6.  Patient will increase bilat hip flexion strength to at least 4/5 in order to improve biomechanics with functional mobility. Baseline:  Goal status: on going 03/11/2024   PLAN:  PT FREQUENCY: 1-2x/week  PT DURATION: 10 weeks  PLANNED INTERVENTIONS: 97164- PT Re-evaluation, 97750- Physical Performance Testing, 97110-Therapeutic exercises, 97530- Therapeutic activity, [id number]- Neuromuscular re-education, 97535- Self Care, [id number]- Manual therapy, [id number]- Gait training, [phone]- Orthotic Initial, [phone]- Orthotic/Prosthetic subsequent, [phone]- Canalith  repositioning, [phone]- Aquatic Therapy, [phone]- Electrical stimulation (unattended), [phone]- Electrical stimulation (manual), [id number]- Vasopneumatic device, [id number]- Ultrasound, [id number]- Ionotophoresis 4mg /ml Dexamethasone , 79439 (1-2 muscles), [id number] (3+ muscles)- Dry Needling, Patient/Family education, Balance training, Stair training, Taping, Joint mobilization, Spinal mobilization, Vestibular training, DME instructions, Cryotherapy, and Moist heat  PLAN FOR NEXT SESSION:    Strengthening as able, shoulder strength/mobility    Susannah Daring, PT, DPT 03/19/2024 11:16 AM       "

## 2024-03-19 ENCOUNTER — Ambulatory Visit

## 2024-03-19 DIAGNOSIS — M25511 Pain in right shoulder: Secondary | ICD-10-CM | POA: Diagnosis not present

## 2024-03-19 DIAGNOSIS — R2681 Unsteadiness on feet: Secondary | ICD-10-CM | POA: Diagnosis not present

## 2024-03-19 DIAGNOSIS — M25561 Pain in right knee: Secondary | ICD-10-CM | POA: Diagnosis not present

## 2024-03-19 DIAGNOSIS — M6281 Muscle weakness (generalized): Secondary | ICD-10-CM

## 2024-03-19 DIAGNOSIS — M25562 Pain in left knee: Secondary | ICD-10-CM

## 2024-03-19 DIAGNOSIS — R293 Abnormal posture: Secondary | ICD-10-CM

## 2024-03-19 DIAGNOSIS — R2689 Other abnormalities of gait and mobility: Secondary | ICD-10-CM

## 2024-03-19 DIAGNOSIS — M25512 Pain in left shoulder: Secondary | ICD-10-CM

## 2024-03-19 DIAGNOSIS — G8929 Other chronic pain: Secondary | ICD-10-CM | POA: Diagnosis not present

## 2024-03-20 ENCOUNTER — Ambulatory Visit: Admitting: Orthopedic Surgery

## 2024-03-20 ENCOUNTER — Encounter: Payer: Self-pay | Admitting: Sports Medicine

## 2024-03-20 DIAGNOSIS — Z9889 Other specified postprocedural states: Secondary | ICD-10-CM

## 2024-03-20 NOTE — Progress Notes (Unsigned)
" ° °  RUFINA KIMERY - 83 y.o. female MRN [id number]  Date of birth: January 02, 1941  Office Visit Note: Visit Date: 03/20/2024 PCP: Caro Harlene POUR, NP Referred by: Caro Harlene POUR, NP  Subjective:  HPI: KOURTNEE LAHEY is a 83 y.o. female who presents today for follow up 2 weeks status post left wrist endoscopic carpal tunnel release.  Pertinent ROS were reviewed with the patient and found to be negative unless otherwise specified above in HPI.   Assessment & Plan: Visit Diagnoses: No diagnosis found.  Plan: ***  Follow-up: No follow-ups on file.   Meds & Orders: No orders of the defined types were placed in this encounter.  No orders of the defined types were placed in this encounter.    Procedures: No procedures performed       Objective:   Vital Signs: There were no vitals taken for this visit.  Ortho Exam ***  Imaging: No results found.   Branna Cortina Afton Alderton, M.D.  [facility]  "

## 2024-03-26 ENCOUNTER — Encounter: Payer: Self-pay | Admitting: Orthopedic Surgery

## 2024-04-01 ENCOUNTER — Encounter: Payer: Self-pay | Admitting: Sports Medicine

## 2024-04-01 ENCOUNTER — Ambulatory Visit (INDEPENDENT_AMBULATORY_CARE_PROVIDER_SITE_OTHER): Admitting: Sports Medicine

## 2024-04-01 DIAGNOSIS — M47812 Spondylosis without myelopathy or radiculopathy, cervical region: Secondary | ICD-10-CM | POA: Diagnosis not present

## 2024-04-01 DIAGNOSIS — M62838 Other muscle spasm: Secondary | ICD-10-CM | POA: Diagnosis not present

## 2024-04-01 DIAGNOSIS — M25432 Effusion, left wrist: Secondary | ICD-10-CM

## 2024-04-01 DIAGNOSIS — M19011 Primary osteoarthritis, right shoulder: Secondary | ICD-10-CM

## 2024-04-01 DIAGNOSIS — R293 Abnormal posture: Secondary | ICD-10-CM

## 2024-04-01 MED ORDER — LIDOCAINE HCL 1 % IJ SOLN
1.0000 mL | INTRAMUSCULAR | Status: AC | PRN
  Administered 2024-04-01: 1 mL

## 2024-04-01 MED ORDER — METHYLPREDNISOLONE ACETATE 40 MG/ML IJ SUSP
40.0000 mg | INTRAMUSCULAR | Status: AC | PRN
  Administered 2024-04-01: 40 mg via INTRAMUSCULAR

## 2024-04-01 MED ORDER — BUPIVACAINE HCL 0.25 % IJ SOLN
1.0000 mL | INTRAMUSCULAR | Status: AC | PRN
  Administered 2024-04-01: 1 mL

## 2024-04-01 NOTE — Progress Notes (Signed)
 "  EZRAH [name] - 84 y.o. female MRN [id number]  Date of birth: [birth year]  Office Visit Note: Visit Date: 04/01/2024 PCP: Caro Harlene POUR, NP Referred by: Caro Harlene POUR, NP  Subjective: Chief Complaint  Patient presents with   Right Shoulder - Follow-up   HPI: Jody [name] is a pleasant 85 y.o. female who presents today for f/u of right shoulder pain, neck pain, s/p L-CTS.  Right shoulder -still bothers her but overall is not her greatest concern.  Doing fine after recent shoulder aspiration and injection.  Still has to modify with swimming, other activity.  She is involved in formalized physical therapy here, [facility] has been helpful.  Neck -has a history of severe degenerative change and osteoarthritic change with essentially autofusion.  CT scan reviewed.  Here more recently she is having left-sided neck and trapezius tightness that is causing pain and limitation.  She denies any numbness tingling down the arm, although is dealing with numbness and tingling from her carpal tunnel surgery.  L-hand/CTS -had surgery with Dr. Erwin.  First few days was feeling really well although swelling returned and now she has recurrence of her symptoms.  Does have a previous brace she wore for carpal tunnel.  Continues on her gabapentin .  Pertinent ROS were reviewed with the patient and found to be negative unless otherwise specified above in HPI.   Assessment & Plan: Visit Diagnoses:  1. Cervical spondylosis without myelopathy   2. Trapezius muscle spasm   3. Abnormal posture   4. Primary osteoarthritis, right shoulder   5. Wrist swelling, left    Plan: Impression is chronic right shoulder pain with advanced osteoarthritic change and rotator cuff arthropathy.  She will continue her formalized PT working on range of motion and function.  She does have severe cervical spondylosis with DDD and is having more so left-sided cervical paraspinal and trapezius hypertonicity in the setting of  shoulder protraction and forward neck nutation.  We did perform trigger point injection for the left trapezius today, patient tolerated well.  Recommended heat, hot shower.  She may continue her Celebrex  100 mg daily for this and overall joint pains.  I would like to add cervical isometrics, scapular retraction and other soft tissue treatment for the neck and trapezius, referral sent to PT to add a few of these maneuvers today.  She also may return to [facility] for specialized dry needling if not settling down.  For her status post carpal tunnel, it is her fluid and swelling that is recurring after a successful carpal tunnel release.  Recommended ice as well as short-term cock up wrist bracing.  She will follow-up with Dr. Erwin if does not improve.  Follow-up: Return if symptoms worsen or fail to improve.   Meds & Orders: No orders of the defined types were placed in this encounter.   Orders Placed This Encounter  Procedures   Ambulatory referral to Physical Therapy    Procedures: Trigger Point Inj  Date/Time: 04/01/2024 11:40 AM  Performed by: Burnetta Brunet, DO Authorized by: Burnetta Brunet, DO   Consent Given by:  Patient Site marked: the procedure site was marked   Timeout: prior to procedure the correct patient, procedure, and site was verified   Indications:  Muscle spasm and pain Total # of Trigger Points:  1 Location: neck   Needle Size:  25 G Approach:  Dorsal Medications #1:  1 mL lidocaine  1 %; 1 mL bupivacaine  0.25 %; 40 mg methylPREDNISolone  acetate 40  MG/ML Patient tolerance:  Patient tolerated the procedure well with no immediate complications Comments: *Procedurally successful trigger point injection into the left trapezius muscle belly       Clinical History: No specialty comments available.  She reports that she quit smoking about 51 years ago. Her smoking use included cigarettes. She started smoking about 71 years ago. She has never used smokeless tobacco. No  results for input(s): HGBA1C, LABURIC in the last 8760 hours.  Objective:    Physical Exam  Gen: Well-appearing, in no acute distress; non-toxic CV: Well-perfused. Warm.  Resp: Breathing unlabored on room air; no wheezing. Psych: Fluid speech in conversation; appropriate affect; normal thought process  Ortho Exam  - Neck: + TTP with significant left-sided paraspinal and trapezius hypertonicity, associated trigger point here.  There is mild bilateral shoulder protraction.  There is limitation with endrange extension secondary to bony block.  Negative Spurling's test.  - Left wrist: Well-healed incision from previous carpal tunnel release.  There is soft tissue swelling noted over the volar aspect of the wrist and hand.  Okay sign intact.  Cap refill less than 2 seconds.  Imaging:  Narrative & Impression  CLINICAL DATA:  Sore throat with difficulty swallowing   EXAM: CT NECK WITH CONTRAST   TECHNIQUE: Multidetector CT imaging of the neck was performed using the standard protocol following the bolus administration of intravenous contrast.   RADIATION DOSE REDUCTION: This exam was performed according to the departmental dose-optimization program which includes automated exposure control, adjustment of the mA and/or kV according to patient size and/or use of iterative reconstruction technique.   CONTRAST:  80mL ISOVUE -300 IOPAMIDOL  (ISOVUE -300) INJECTION 61%   COMPARISON:  None Available.   FINDINGS: Pharynx and larynx: No evidence of mass or inflammation. Very distended esophagus without wall thickening where covered.   Salivary glands: Suspect partial parotidectomy on the left. No mass or acute inflammation.   Thyroid : Unremarkable for age   Lymph nodes: None enlarged or abnormal density.   Vascular: Atheromatous calcification which is multifocal. Bulky plaque at the proximal right ICA with suspected flow reducing stenosis.   Limited intracranial: Negative    Visualized orbits: Bilateral cataract resection. Senescent globe calcifications.   Mastoids and visualized paranasal sinuses: Clear   Skeleton: Advanced cervical spine degeneration. Advanced C1-2 degeneration at the right facet and atlantodental joint.   Upper chest: Biapical reticulation best attributed to pleural based scarring, stable where covered when compared to 2008.   IMPRESSION: 1. Marked gaseous distension of the esophagus from the esophageal verge into the upper chest, recommend esophagram to evaluate for achalasia or distal obstruction. 2. No mass or adenopathy seen in the neck. 3. 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 4. Severe cervical spine degeneration.    Past Medical/Family/Surgical/Social History: Medications & Allergies reviewed per EMR, new medications updated. Patient Active Problem List   Diagnosis Date Noted   Left carpal tunnel syndrome 03/08/2024   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   Neuropathy 07/31/2023   Pure hypercholesterolemia 11/14/2019   Gastroesophageal reflux disease 02/04/2019   Lumbar back pain with radiculopathy affecting left lower extremity 04/11/2016   Cervical spondylosis without myelopathy 06/08/2015   Absolute anemia 05/16/2014   Primary osteoarthritis involving multiple joints 05/16/2014   Essential hypertension 09/03/2012   Senile osteoporosis    B12 deficiency due to diet    Past Medical History:  Diagnosis Date   Anemia, unspecified    on meds   B12  deficiency due to diet    Basal cell carcinoma    LEFT UPPER ARM SUP. TX=CX3 5FU   Cataract    bilateral sx   Cramp of limb    Edema of both legs    Encounter for long-term (current) use of other medications    Hypertension    on meds   Lumbago    Melanoma (HCC) 10/18/2010   RIGHT POST SHOULDER =MOHS   Obesity    Osteoarthrosis, unspecified whether generalized or localized, unspecified  site    generalized   Restless legs syndrome (RLS)    SCC (squamous cell carcinoma) 07/03/2017   RIGHT CHEST CX3 5FU   SCC (squamous cell carcinoma) 01/28/2019   LEFT FOREARM CX3 5FU   SCC (squamous cell carcinoma) [id number]   RIGHT FOREHEAD CX3 5FU   SCC (squamous cell carcinoma) 07/03/2017   RIGHT FOREARM CX3 5FU   SCCA (squamous cell carcinoma) of skin 04/08/2020   Right Anterior Mandible (in situ)(CX35FU)   SCCA (squamous cell carcinoma) of skin 04/08/2020   Left Supraorbital Region (Keratoacanthoma)   Senile osteoporosis    Squamous cell carcinoma of skin 01/30/2017   RIGHT JAWLINE TX WITH BX   Family History  Problem Relation Age of Onset   Diabetes Father    Prostate cancer Father    Rectal cancer Sister 32   Colon polyps Sister    Cancer Sister    Diabetes Brother    Prostate cancer Brother    Lung cancer Brother 55   Other Brother        declined after a fall   Stomach cancer Maternal Grandmother    Pancreatic cancer Neg Hx    Colon cancer Neg Hx    Esophageal cancer Neg Hx    Past Surgical History:  Procedure Laterality Date   CARPAL TUNNEL RELEASE Left 03/08/2024   Procedure: RELEASE, CARPAL TUNNEL, ENDOSCOPIC;  Surgeon: Arlinda Buster, MD;  Location: [facility];  Service: Orthopedics;  Laterality: Left;   CATARACT EXTRACTION W/ INTRAOCULAR LENS  IMPLANT, BILATERAL  04/2014   COLONOSCOPY     EYE SURGERY Right 04/28/2016   Macular   LEFT HEART CATH AND CORONARY ANGIOGRAPHY N/A 05/15/2023   Procedure: LEFT HEART CATH AND CORONARY ANGIOGRAPHY;  Surgeon: Ladona Heinz, MD;  Location: [facility];  Service: Cardiovascular;  Laterality: N/A;   SPINE SURGERY  2005   STOMACH SURGERY  1981   stapleing    TONSILLECTOMY     UPPER GASTROINTESTINAL ENDOSCOPY     VITRECTOMY Right    with membrane peeling for a macular pucker   Social History   Occupational History   Occupation: Retired Nurse, Mental Health: [employer]  Tobacco  Use   Smoking status: Former    Current packs/day: 0.00    Types: Cigarettes    Start date: 1955    Quit date: 1975    Years since quitting: 51.0   Smokeless tobacco: Never   Tobacco comments:    Quit in early 30's  Vaping Use   Vaping status: Never Used  Substance and Sexual Activity   Alcohol use: No    Alcohol/week: 0.0 standard drinks of alcohol   Drug use: No   Sexual activity: Not Currently    Birth control/protection: Post-menopausal   "

## 2024-04-04 NOTE — Therapy (Incomplete)
 " OUTPATIENT PHYSICAL THERAPY TREATMENT   Patient Name: Jody [name] MRN: [id number] DOB:[birth year], 84 y.o., female Today's Date: 04/04/2024  END OF SESSION:     Past Medical History:  Diagnosis Date   Anemia, unspecified    on meds   B12 deficiency due to diet    Basal cell carcinoma    LEFT UPPER ARM SUP. TX=CX3 5FU   Cataract    bilateral sx   Cramp of limb    Edema of both legs    Encounter for long-term (current) use of other medications    Hypertension    on meds   Lumbago    Melanoma (HCC) 10/18/2010   RIGHT POST SHOULDER =MOHS   Obesity    Osteoarthrosis, unspecified whether generalized or localized, unspecified site    generalized   Restless legs syndrome (RLS)    SCC (squamous cell carcinoma) 07/03/2017   RIGHT CHEST CX3 5FU   SCC (squamous cell carcinoma) 01/28/2019   LEFT FOREARM CX3 5FU   SCC (squamous cell carcinoma) [id number]   RIGHT FOREHEAD CX3 5FU   SCC (squamous cell carcinoma) 07/03/2017   RIGHT FOREARM CX3 5FU   SCCA (squamous cell carcinoma) of skin 04/08/2020   Right Anterior Mandible (in situ)(CX35FU)   SCCA (squamous cell carcinoma) of skin 04/08/2020   Left Supraorbital Region (Keratoacanthoma)   Senile osteoporosis    Squamous cell carcinoma of skin 01/30/2017   RIGHT JAWLINE TX WITH BX   Past Surgical History:  Procedure Laterality Date   CARPAL TUNNEL RELEASE Left 03/08/2024   Procedure: RELEASE, CARPAL TUNNEL, ENDOSCOPIC;  Surgeon: Arlinda Buster, MD;  Location: [facility];  Service: Orthopedics;  Laterality: Left;   CATARACT EXTRACTION W/ INTRAOCULAR LENS  IMPLANT, BILATERAL  04/2014   COLONOSCOPY     EYE SURGERY Right 04/28/2016   Macular   LEFT HEART CATH AND CORONARY ANGIOGRAPHY N/A 05/15/2023   Procedure: LEFT HEART CATH AND CORONARY ANGIOGRAPHY;  Surgeon: Ladona Heinz, MD;  Location: [facility];  Service: Cardiovascular;  Laterality: N/A;   SPINE SURGERY  2005   STOMACH SURGERY  1981   stapleing     TONSILLECTOMY     UPPER GASTROINTESTINAL ENDOSCOPY     VITRECTOMY Right    with membrane peeling for a macular pucker   Patient Active Problem List   Diagnosis Date Noted   Left carpal tunnel syndrome 03/08/2024   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   Neuropathy 07/31/2023   Pure hypercholesterolemia 11/14/2019   Gastroesophageal reflux disease 02/04/2019   Lumbar back pain with radiculopathy affecting left lower extremity 04/11/2016   Cervical spondylosis without myelopathy 06/08/2015   Absolute anemia 05/16/2014   Primary osteoarthritis involving multiple joints 05/16/2014   Essential hypertension 09/03/2012   Senile osteoporosis    B12 deficiency due to diet     PCP: Harlene MARLA An, NP  REFERRING PROVIDER: Lonell Sprang, DO  REFERRING DIAG: M17.0 (ICD-10-CM) - Bilateral primary osteoarthritis of knee M19.011,M19.012 (ICD-10-CM) - Primary osteoarthritis of shoulders, bilateral M12.811,M12.812 (ICD-10-CM) - Rotator cuff arthropathy of both shoulders R29.898 (ICD-10-CM) - Leg weakness, bilateral  THERAPY DIAG:  No diagnosis found.  Rationale for Evaluation and Treatment: Rehabilitation  ONSET DATE: 01/28/2024 is when it worsened   SUBJECTIVE:   SUBJECTIVE STATEMENT: *** Patient endorsing continued improvement in overall symptoms, though difficulty noted with Rt shoulder.  PERTINENT HISTORY: Patient reports that she has chronic bilat knee (Lt>Rt) pain and wears a brace on Lt knee. Patient  endorses that she fell and fractured Lt patella in 2017 which is why it still hurts. Patient also endorses one other fall in 2018 out of her attic which led to a hospitalization and cervical fusion. Patient endorses bilat shoulder pain (Rt>Lt) and requires assistance for reaching with Rt shoulder flexion. Patient recently finished a prednisone  regimen which has improved overall symptoms/pain back to her normal. Patient recently began feeling  aching/trembling in Lt LE, has been experiencing generalized muscle atrophy and weakness, and requires aquatic therapy at least 4x/wk in order to continue to be able to get out of bed. Patient endorses one other orthopedic surgery in lower back with breakdown of adjacent vertebrae above and below. Patient has severe osteoporosis.   See PMH or personal factors for more in depth comorbidities  PAIN:  NPRS scale: 3/10 Pain location: shoulder, knees, LEs in general Pain description: consistent, debilitating Aggravating factors: standing, walking, reaching, functional IR  Relieving factors: aquatic therapy   PRECAUTIONS: Fall and high risk for fractures as she is severely osteoporotic   RED FLAGS: Bowel or bladder incontinence: Yes: referral in for a urologist and Cauda equina syndrome: No   WEIGHT BEARING RESTRICTIONS: No  FALLS:  Has patient fallen in last 6 months? No  LIVING ENVIRONMENT: Lives with: lives with their spouse Lives in: House/apartment Stairs: Yes: External: 1 from carport to inside the house steps; none; steps into the attic with pulldown rail which she goes into often Has following equipment at home: Single point cane, Environmental Consultant - 2 wheeled, Environmental Consultant - 4 wheeled, Marine Scientist  OCCUPATION: retired   PLOF: Independent and uses a Recruitment Consultant  PATIENT GOALS: to get a little stronger, to get out of a chair without making a spectacle   NEXT MD VISIT: 02/19/2024 with Dr. Burnetta   OBJECTIVE:  Note: Objective measures were completed at Evaluation unless otherwise noted.  DIAGNOSTIC FINDINGS:  X-rays demonstrate severe bone-on-bone collapse of the lateral tibiofemoral joint space. There is notable subchondral sclerosis of the lateral femoral condyle and lateral tibial plateau. The knee is fall into a valgus fashion bilaterally. Lateral view of the left knee shows healed fragmentation of the patella likely from prior fracture. No acute fracture or acute bony abnormality  otherwise noted.   PATIENT SURVEYS:  PSFS: THE PATIENT SPECIFIC FUNCTIONAL SCALE  Place score of 0-10 (0 = unable to perform activity and 10 = able to perform activity at the same level as before injury or problem)  Activity Date:  02/08/2024 03/11/2024   Walking distance  5 7   2. Getting up from a chair  5 6   3. Using arms (reaching) 3 7   4.  Balance and mobility  5 5   5. Walking with a purpose 3 2   Total Score 4.2 5.4     Total Score = Sum of activity scores/number of activities  Minimally Detectable Change: 3 points (for single activity); 2 points (for average score)  [facility] (nd). The Patient Specific Functional Scale . Retrieved from [email or web address]   COGNITION: Eval:  Overall cognitive status: Within functional limits for tasks assessed     SENSATION: Eval: Light touch: WFL Patient endorses highly sensitive to touch on bilat LE   POSTURE:  Eval: rounded shoulders, forward head, and increased thoracic kyphosis   LOWER EXTREMITY ROM:  Active ROM Right Eval 02/08/2024 Left Eval 02/08/2024  Hip flexion    Hip extension    Hip abduction    Hip adduction  Hip internal rotation    Hip external rotation    Knee flexion (seated) [facility]  Knee extension (seated) WFL ~90% WFL  Ankle dorsiflexion    Ankle plantarflexion    Ankle inversion    Ankle eversion     (Blank rows = not tested)  UE ROM: 02/08/2024, seated AROM  Shoulder flexion: Lt: WFL Rt: 69deg Shoulder abduction: Lt: WFL, but painful around 90deg Rt: 60deg Shoulder functional IR: Lt: T12 Rt: iliac crest   Shoulder functional ER: Lt: T2 Rt: UT  LOWER EXTREMITY MMT: painful with MMT   MMT Right Eval 02/08/2024 Left Eval 02/08/2024 Right 03/11/2024 Left 03/11/2024  Hip flexion (seated) 3+/5 3+/5 4/5 4/5  Hip extension      Hip abduction      Hip adduction      Hip internal rotation      Hip external  rotation      Knee flexion (seated) 4-/5 4-/5    Knee extension (seated) 4/5 4-/5    Ankle dorsiflexion       Ankle plantarflexion      Ankle inversion      Ankle eversion       (Blank rows = not tested)   FUNCTIONAL TESTS:  Eval: 5 times sit to stand: 13s from arm chair using bilat UEs after first stand   SPECIAL TESTS: Slump 02/08/2024: positive on Rt side   GAIT: Eval:  Distance walked: not objectively measured  Assistive device utilized: SPC/walking stick Level of assistance: supervision  Comments: antalgic and decreased speed                   TREATMENT         DATE:  04/05/2024 ***   TREATMENT         DATE:  03/19/2024 TherEx: Nustep level 3 for 8.5 minutes with bilat LE only  PT discussed HEP progressions at home and adding seated clamshells to HEP   TherAct: (to assist with transfers, stairs, etc.) Sit<>stands in 18 chair 1x10 with focus on lowering  using forward momentum and elbows on thighs for stand Seated clamshells with green TB 2x15  Step ups with 6 step using unilat UE support on // bars 1x10 each LE, 1x6 each LE  Patient performing calf raises into toe raises between sets    TREATMENT         DATE:  03/14/2024 TherEx:  Nustep level 3 for 6 minutes  Slant board gastroc stretch 3x30s  Calf raises into toe raises 1x20 with unilat UE use   TherAct:  Fwd step ups with 4 step 2x8 leading with each LE and decreasing UE use on // bars with second trial  One instance of posterior LOB secondary to catching toe on step during step down with PT assist to min A for balance  Ambulation with SPC and change in speed in order to challenge walking with a purpose 3x20' with SBA-CGA  PT discussed confidence, visual cues, and how to decrease chances of falling  Lateral step ups with 4 step 1x8 each side with increased UE use on // bars  PT discussing importance of performing fwd and lateral step ups with patient for strength purposes, but also for if/when she may  need to rely on one side vs the other due to pain, etc.   Self-Care: PT discussed POC, how to continue increasing challenges at home, and returning to pool    TREATMENT  DATE:  03/11/2024 Therex: Nustep Lvl 5 for 6.5 mins LE only  Incline gastroc stretch 30 sec x 3 bilaterally   Self Care Advice given on hydration and stretching to help cramping.  Also mentioned possible electrolyte drink (watch sugar content).   TherActivity (to improve transfers, stairs, reaching ) Step up forward 4 inch step with hand assist on bar x 10 bilaterally with slow lowering  Sit to stand to sit 18 inch chair s UE assist with slow sitting 2 x 5  Seated wand AAROM flexion to tolerance. 2 lb 2 x 10 (adjusted hand position for Lt to help more in concentric movement.   Neuro Re-ed Modified tandem stance 30 sec x 2 with SBA Alternating heel/toe lifts x 20 each way with SBA , cues for techniques                                                                                                                               TREATMENT         DATE:  03/05/2024 TherEx: Nustep level 4 for 8 minutes with bilat LE only  Seated shoulder flexion with 2# bar 2x12  Seated shoulder abduction 1x20  Seated ER with scap retraction 1x20  Calf raises 1x20   TherAct:  Sit<>stands with 5# KB 3x5 (mat table at 19.5)  Step ups with 4 step 1x8 leading with each foot; intermittent UE use for stability purposes     PATIENT EDUCATION:  Education details: HEP, POC, aquatic PT, sciatica  Person educated: Patient Education method: Explanation, Demonstration, Tactile cues, Verbal cues, and Handouts Education comprehension: verbalized understanding, returned demonstration, verbal cues required, tactile cues required, and needs further education  HOME EXERCISE PROGRAM: Access Code: [email or web address] URL: [email or web address] Date: 02/08/2024 Prepared by: Susannah Daring  Exercises - Mini Squat with Counter Support   - 1 x daily - 7 x weekly - 2 sets - 5 reps - Seated Long Arc Quad  - 1 x daily - 7 x weekly - 2 sets - 10 reps - 2seconds hold - Standing Knee Flexion AROM with Chair Support  - 1 x daily - 7 x weekly - 2 sets - 10 reps - 2seconds  hold - AAROM/AROM Shoulder Flexion at Cabinet  - 1 x daily - 7 x weekly - 2 sets - 10 reps  ASSESSMENT:  CLINICAL IMPRESSION: Patient arrived to session noting ***. Patient will continue to benefit from skilled PT.  OBJECTIVE IMPAIRMENTS: Abnormal gait, decreased activity tolerance, decreased balance, decreased coordination, decreased endurance, decreased mobility, difficulty walking, decreased ROM, decreased strength, improper body mechanics, postural dysfunction, and pain.   ACTIVITY LIMITATIONS: carrying, lifting, bending, sitting, standing, squatting, stairs, transfers, continence, dressing, and reach over head  PARTICIPATION LIMITATIONS: cleaning, community activity, and yard work  PERSONAL FACTORS: Age, Behavior pattern, Past/current experiences, Time since onset of injury/illness/exacerbation, and 3+ comorbidities: anemia, osteoporosis, history of cancer, cataracts, HTN, prior falls are also affecting patient's functional outcome.  REHAB POTENTIAL: Fair lacks compliance, high pain levels, multiple comorbidities   CLINICAL DECISION MAKING: Evolving/moderate complexity  EVALUATION COMPLEXITY: Moderate   GOALS: Goals reviewed with patient? Yes  SHORT TERM GOALS: Target date: 03/07/2024 Patient will show compliance with initial HEP. Baseline: Goal status: GOAL MET, 02/29/2024  2.  Patient will report pain levels no greater than 6/10 in order to show an improvement in overall quality of life. Baseline:  Goal status: goal ongoing, 02/29/2024   LONG TERM GOALS: Target date: 04/18/2024  Patient will be independent with final HEP in order to maintain and progress upon functional gains made within PT. Baseline:  Goal status: on going 03/11/2024  2.   Patient will report pain levels no greater than 5/10 in order to show an improvement in overall quality of life. Baseline:  Goal status: on going 03/11/2024  3.  Patient will increase PSFS to at least 6.2 in order to show a significant improvement in subjective disability. Baseline:  Goal status: on going 03/11/2024  4.  Patient will perform 5xSTS to at least 12s in order to decrease risk of falls.  Baseline:  Goal status: on going 03/11/2024  5.  Patient will improve Rt shoulder flexion and abduction to at least 90deg to improve overall functional mobility.  Baseline:  Goal status: on going 03/11/2024  6.  Patient will increase bilat hip flexion strength to at least 4/5 in order to improve biomechanics with functional mobility. Baseline:  Goal status: on going 03/11/2024   PLAN:  PT FREQUENCY: 1-2x/week  PT DURATION: 10 weeks  PLANNED INTERVENTIONS: [id number]- PT Re-evaluation, 97750- Physical Performance Testing, 97110-Therapeutic exercises, 97530- Therapeutic activity, [id number]- Neuromuscular re-education, 97535- Self Care, [id number]- Manual therapy, [id number]- Gait training, [phone]- Orthotic Initial, [phone]- Orthotic/Prosthetic subsequent, [phone]- Canalith repositioning, [phone]- Aquatic Therapy, [phone]- Electrical stimulation (unattended), [phone]- Electrical stimulation (manual), [id number]- Vasopneumatic device, [id number]- Ultrasound, [id number]- Ionotophoresis 4mg /ml Dexamethasone , [id number] (1-2 muscles), [id number] (3+ muscles)- Dry Needling, Patient/Family education, Balance training, Stair training, Taping, Joint mobilization, Spinal mobilization, Vestibular training, DME instructions, Cryotherapy, and Moist heat  PLAN FOR NEXT SESSION:    *** Strengthening as able, shoulder strength/mobility    Susannah Daring, PT, DPT 04/04/2024 12:53 PM       "

## 2024-04-05 ENCOUNTER — Encounter

## 2024-04-10 NOTE — Progress Notes (Signed)
 "  I saw Jody [name] in neurology clinic on 04/18/24 in follow up for numbness and tingling in feet.  HPI: Jody [name] is a 84 y.o. year old female with a history of HTN, CKD3, RLS, OA, insomnia, stomach stapled (1980) who we last saw on 04/12/23.  To briefly review: 10/21/22: Patient first noticed symptoms in 2017. She fell and broke her patella. While she was going to ortho, she mentioned numbness, tingling, burning in feet. She felt like she didn't know if she had socks on. She does not think her symptoms have worsened, but she feels less tolerant of the symptoms. After she fell in 2017, she started using a cane because she didn't want to fall again. She does not use it in the house. She has osteoporosis and knows she can break a bone easily. She does endorse imbalance. She occasionally stumbles and will fall, but usually related to the cat getting under her feet. She has fallen maybe 3 times in the last year.   [facility] pain. She had back surgery in 2005 (lumbar spine) and mentions the spine above and below is mush. She does not want to have another surgery. She takes gabapentin . She is prescribed 300 mg TID but usually only takes in the morning and night. She takes Celebrex  as well. She thinks both are working for her back. She does not think the gabapentin  is helping with the neuropathy as much. She has tried Lyrica and Nortriptyline (for sleep), but these did not work.   She endorses leg cramps. She endorses some difficulty with controlling urine as she has gotten older. Her bowel function fluctuates from constipation to diarrhea on an unpredictable pattern. She does not take anything for this.   She does not report any constitutional symptoms like fever, night sweats, anorexia or unintentional weight loss. She has lost about 60 lbs on purpose since 2022 because she felt she was carrying too much weight. Her weight has been stable for the last year.    EtOH use: None  Restrictive diet? No, but has terrible eating habits. 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 Family history of neuropathy/myopathy/neurologic disease? Dad and brother had neuropathy   EMG performed by me on 08/29/22 was consistent with a length dependent sensorimotor neuropathy (axonal).  04/12/23: B1 and B6 were both elevated. She was taking a B complex which she stopped after these lab results.   Patient still has a lot of arthritis, but seeing sports medicine who is really helping with this. Her tingling, numbness, or burning is about the same as prior. She has had a lot of issues with her left hip and leg. She will have flares, but again is seeing sports medicine for this.   She is taking taking gabapentin  300 mg in the morning and 600 mg at night. This is helping with the pain. She is happy with where this is currently.  Most recent Assessment and Plan (04/12/23): This is Jody [name], a 84 y.o. female with burning, tingling, and numbness in her feet. 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   Plan: -Continue gabapentin  300 mg in the morning and 600 mg at night -Lidocaine  cream as needed -Alpha lipoic acid 600 mg once or twice daily -Follow up with sports medicine as planned for arthritis -Fall precautions discussed  Since their last visit: Patient is doing well. She still has some burning,  tingling, and numbness in her feet. She is still having a lot of arthritic pains as well. She is taking gabapentin  300 mg in the morning and 600 mg at bedtime. She think this is stable and doing well. She denies any recent falls. She denies any side effects to gabapentin .  She is working with a sports medicine doctor for her arthritic pains and works with PT with strength. This has significantly helped her overall pain level.   MEDICATIONS:  Outpatient Encounter Medications as of  04/18/2024  Medication Sig   Calcium Carbonate-Vit D-Min (CALCIUM 1200 PO) Take 1 tablet by mouth in the morning.   celecoxib  (CELEBREX ) 100 MG capsule 1 tablet by mouth daily, will take additional tablet if needed   diclofenac Sodium (VOLTAREN ARTHRITIS PAIN) 1 % GEL Apply 1 Application topically 4 (four) times daily as needed (pain.).   gabapentin  (NEURONTIN ) 300 MG capsule Take 1 capsule (300 mg total) by mouth 3 (three) times daily.   lidocaine  (LIDODERM ) 5 % Place 1 patch onto the skin daily as needed (pain.). Remove & Discard patch within 12 hours or as directed by MD   lisinopril  (ZESTRIL ) 5 MG tablet Take 1 tablet (5 mg total) by mouth daily.   Multiple Vitamin (MULTIVITAMIN WITH MINERALS) TABS tablet Take 1 tablet by mouth in the morning.   nitroGLYCERIN  (NITROSTAT ) 0.4 MG SL tablet DISSOLVE 1 TABLET UNDER THE TONGUE EVERY 5 MINUTES AS NEEDED FOR CHEST PAIN   olopatadine (PATADAY) 0.1 % ophthalmic solution Place 1 drop into both eyes daily.   Probiotic Product (ALIGN) 10 MG CAPS Take 1 tablet by mouth daily. (Patient taking differently: Take 1 tablet by mouth daily. Every other day)   Zoledronic  Acid (RECLAST  IV) as directed. Once yearly, last infusion was November 2024   traMADol  (ULTRAM ) 50 MG tablet Take 1 tablet (50 mg total) by mouth every 6 (six) hours as needed.   No facility-administered encounter medications on file as of 04/18/2024.    PAST MEDICAL HISTORY: Past Medical History:  Diagnosis Date   Anemia, unspecified    on meds   B12 deficiency due to diet    Basal cell carcinoma    LEFT UPPER ARM SUP. TX=CX3 5FU   Cataract    bilateral sx   Cramp of limb    Edema of both legs    Encounter for long-term (current) use of other medications    Hypertension    on meds   Lumbago    Melanoma (HCC) 10/18/2010   RIGHT POST SHOULDER =MOHS   Obesity    Osteoarthrosis, unspecified whether generalized or localized, unspecified site    generalized   Restless legs syndrome  (RLS)    SCC (squamous cell carcinoma) 07/03/2017   RIGHT CHEST CX3 5FU   SCC (squamous cell carcinoma) 01/28/2019   LEFT FOREARM CX3 5FU   SCC (squamous cell carcinoma) [id number]   RIGHT FOREHEAD CX3 5FU   SCC (squamous cell carcinoma) 07/03/2017   RIGHT FOREARM CX3 5FU   SCCA (squamous cell carcinoma) of skin 04/08/2020   Right Anterior Mandible (in situ)(CX35FU)   SCCA (squamous cell carcinoma) of skin 04/08/2020   Left Supraorbital Region (Keratoacanthoma)   Senile osteoporosis    Squamous cell carcinoma of skin 01/30/2017   RIGHT JAWLINE TX WITH BX    PAST SURGICAL HISTORY: Past Surgical History:  Procedure Laterality Date   CARPAL TUNNEL RELEASE Left 03/08/2024   Procedure: RELEASE, CARPAL TUNNEL, ENDOSCOPIC;  Surgeon: Arlinda Buster, MD;  Location: [facility];  Service: Orthopedics;  Laterality: Left;   CATARACT EXTRACTION W/ INTRAOCULAR LENS  IMPLANT, BILATERAL  04/2014   COLONOSCOPY     EYE SURGERY Right 04/28/2016   Macular   LEFT HEART CATH AND CORONARY ANGIOGRAPHY N/A 05/15/2023   Procedure: LEFT HEART CATH AND CORONARY ANGIOGRAPHY;  Surgeon: Ladona Heinz, MD;  Location: [facility];  Service: Cardiovascular;  Laterality: N/A;   SPINE SURGERY  2005   STOMACH SURGERY  1981   stapleing    TONSILLECTOMY     UPPER GASTROINTESTINAL ENDOSCOPY     VITRECTOMY Right    with membrane peeling for a macular pucker    ALLERGIES: Allergies[1]  FAMILY HISTORY: Family History  Problem Relation Age of Onset   Diabetes Father    Prostate cancer Father    Rectal cancer Sister 22   Colon polyps Sister    Cancer Sister    Diabetes Brother    Prostate cancer Brother    Lung cancer Brother 8   Other Brother        declined after a fall   Stomach cancer Maternal Grandmother    Pancreatic cancer Neg Hx    Colon cancer Neg Hx    Esophageal cancer Neg Hx     SOCIAL HISTORY: Social History[2] Social History   Social History Narrative   Married  -Donald married 1966   Former Smoker stopped 1977   Alcohol none   Exercise -Deep water 4 times a week for one hour   POA   Are you right handed or left handed? right   Are you currently employed ?    What is your current occupation?   Do you live at home alone?   Who lives with you   What type of home do you live in: 1 story or 2 story? one        Objective:  Vital Signs:  BP 137/77   Pulse 84   Ht 5' (1.524 m)   Wt 118 lb (53.5 kg)   SpO2 98%   BMI 23.05 kg/m   General: General appearance: Awake and alert. No distress. Cooperative with exam.  Skin: No obvious rash or jaundice. HEENT: Atraumatic. Anicteric. Lungs: Non-labored breathing on room air  Heart: Regular Extremities: No edema. Arthritic changes in bilateral hands and feet  Neurological: Mental Status: Alert. Speech fluent. No pseudobulbar affect Cranial Nerves: CNII: No RAPD. Visual fields intact. CNIII, IV, VI: PERRL. No nystagmus. EOMI. CN V: Facial sensation intact bilaterally to fine touch. CN VII: Facial muscles symmetric and strong. No ptosis at rest. CN VIII: Hears finger rub well bilaterally. CN IX: No hypophonia. CN X: Palate elevates symmetrically. CN XI: Full strength shoulder shrug bilaterally. CN XII: Tongue protrusion full and midline. No atrophy or fasciculations. No significant dysarthria Motor: Tone is normal. Strength 5/5 in bilateral upper and lower extremities. Reflexes:  Right Left  Bicep 2+ 2+  Tricep 2+ 2+  BrRad 2+ 2+  Knee 0 0  Ankle 0 0   Sensation: Intact to light touch Coordination: Intact finger-to- nose-finger bilaterally Gait: Able to rise from chair with arms crossed unassisted. Normal, narrow-based gait. Able to walk without her walking stick but has it if needed.   Lab and Test Review: New results: 02/02/24: Ferritin 219 CMP unremarkable CBC w/ diff with mild anemia (Hb 11.2)  TSH (09/27/23): wnl  MBS (02/27/24): Clinical Impression: Pt presents with an  overall functional oropharyngeal swallow but primary esophageal dysphagia. She compensates well  for diffuse esophageal distension, achieving complete epiglottic inversion and laryngeal closure. The air-filled esophagus and distended PES divert the path of the bolus but pharyngeal peristalsis does effectively clear the pharynx. There is no significant residue or airway invasion. The 13 mm barium tablet was given with thin liquids and followed with purees contributing to the majority of these boluses remaining in the esophagus. Esophageal dilation and tortuosity have been identified on previous esophagram and endoscopy but per MD note, pt was not complaining of difficulty swallowing at that time. Could consider repeating esophageal assessment now to determine potential to offer relief for pt's symptoms. Otherwise, recommend she continue current diet without ongoing SLP f/u.   Previously reviewed results: Lipid panel (02/28/23): tChol 156, LDL 69, TG 88   10/21/22: B1: 209 (high) B6: 75.7 (high)   Folate (11/05/21): wnl   External labs: 08/16/22: Ferritin: 211 HbA1c: 5.7 CMP unremarkable Vit D wnl IFE: no M protein TSH: wnl (4.36) CBC unremarkable B12: 484   Imaging: MRI cervical spine wo contrast (06/19/20): FINDINGS: Alignment: Grade 1 retrolisthesis at C3-4 and C5-6   Vertebrae: No fracture, evidence of discitis, or bone lesion.   Cord: Normal signal and morphology.   Posterior Fossa, vertebral arteries, paraspinal tissues: Negative.   Disc levels:   C1-2: Unremarkable.   C2-3: Normal disc space and facet joints. There is no spinal canal stenosis. No neural foraminal stenosis.   C3-4: Small disc bulge. There is no spinal canal stenosis. Moderate left neural foraminal stenosis.   C4-5: Disc space narrowing with fusion across the posterior aspect of the disc space. There is no spinal canal stenosis. No neural foraminal stenosis.   C5-6: Disc space narrowing with small bulge  and uncovertebral hypertrophy. There is no spinal canal stenosis. Moderate right neural foraminal stenosis.   C6-7: Normal disc space and facet joints. There is no spinal canal stenosis. No neural foraminal stenosis.   C7-T1: Normal disc space and facet joints. There is no spinal canal stenosis. No neural foraminal stenosis.   IMPRESSION: 1. Moderate left C3-4 and right C5-6 neural foraminal stenosis. 2. No spinal canal stenosis.   MRI lumbar spine wo contrast (04/12/16): FINDINGS: SEGMENTATION: For the purposes of this report, the last well-formed intervertebral disc will be described as L5-S1.   ALIGNMENT: Maintenance of the lumbar lordosis. Grade 1 increased L4-5 anterolisthesis (5 mm, previously 2 mm). Lower lumbar levoscoliosis inferred on axial sequences.   VERTEBRAE:Vertebral bodies are intact. Status post L3-4 PLIF and posterior decompression. Severe L2-3 disc height loss and proportional chronic discogenic endplate changes. Moderate L4-5 and L5-S1 disc height loss, desiccation and vacuum disc and acute on chronic discogenic endplate change. Severe T11-12 degenerative disc and moderate acute on chronic discogenic endplate changes. Small T12-L1   CONUS MEDULLARIS: Conus medullaris terminates at L1-2 and demonstrates normal morphology and signal characteristics. Cauda equina is normal.   PARASPINAL AND SOFT TISSUES: Included prevertebral and paraspinal soft tissues are nonacute. Partially imaged sigmoid diverticulosis.   DISC LEVELS:   T12-L1: Small broad-based disc bulge and small central disc protrusion without canal stenosis or neural foraminal narrowing.   L1-2: Annular bulging. No canal stenosis or neural foraminal narrowing.   L2-3: Moderate broad-based disc bulge, endplate spurring and mild facet arthropathy with ligamentum flavum redundancy. Mild to moderate canal stenosis and trefoil configuration. Minimal suspected bilateral neural foraminal narrowing  may be overestimated by hardware artifact.   L3-4: PLIF and posterior instrumentation without canal stenosis or neural foraminal narrowing.   L4-5: Anterolisthesis. Moderate  broad-based disc bulge. Severe facet arthropathy and ligamentum flavum redundancy with trace facet effusions which are likely reactive. Moderate canal stenosis. Severe RIGHT, mild LEFT neural foraminal narrowing.   L5-S1: 11 x 11 mm RIGHT central disc extrusion with 12 mm of contiguous cephalad migration displaces the traversing RIGHT L5 nerve. Severe facet arthropathy and ligamentum flavum redundancy with trace facet effusions which are likely reactive. 4 mm synovial cyst migrated into the dorsal epidural space. 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 Mild canal stenosis and encroachment upon the traversing S1 nerve within the lateral recess. Moderate to severe LEFT neural foraminal narrowing.   IMPRESSION: Large L5-S1 disc extrusion displaces the traversing RIGHT L5 nerve and encroaches upon the traversing RIGHT S1 nerve.   L3-4 PLIF and posterior decompression. 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   Multilevel neural foraminal narrowing: Severe on the RIGHT at L4-5, moderate to severe on the LEFT at L5-S1.   EMG (08/29/22): NCV & EMG Findings: Extensive electrodiagnostic evaluation of the right and left lower limbs shows: Bilateral sural and superficial peroneal/fibular sensory responses are absent. Right peroneal/fibular (EDB) motor response is absent. Left peroneal/fibular (EDB) motor response shows reduced amplitude (1.23 mV). Bilateral tibial (AH) motor responses show reduced amplitude (L2.8, R3.6 mV). Bilateral peroneal/fibular (TA) motor responses are within normal limits. Bilateral H reflex is  absent. Chronic motor axon loss changes without accompanying active denervation changes are seen in bilateral tibialis anterior and bilateral medial head of gastrocnemius muscles.   Impression: This is an abnormal study. 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 The severity is difficult to grade as some of changes can be related to age, but likely at least moderate in degree electrically. No definite electrodiagnostic evidence of a right or left lumbosacral (L3-S1) motor radiculopathy.  MRI right shoulder wo contrast (03/04/23): IMPRESSION: 1. Complete full-thickness, full width tear of the supraspinatus tendon with 3.4 cm of retraction. 2. Moderate infraspinatus tendinosis with a full-thickness tear of the anterior half of the tendon. 3. Moderate subscapularis tendinosis. Severe atrophy of the subscapularis muscle. 4. Complete tear of the long head of the biceps tendon. 5. Severe osteoarthritis of the right glenohumeral joint.   ASSESSMENT: This is Jody [name], a 84 y.o. female with burning, tingling, and numbness in her feet. 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 Overall, her symptoms are stable to improved over the last year with no falls.  Plan: -Continue gabapentin  300 mg in the morning and 600 mg at night -Lidocaine  cream as needed -Alpha lipoic acid 600 mg once or twice daily -Fall precautions discussed  Return to clinic in 1 year  Total time spent reviewing records, interview, history/exam, documentation, and coordination of care on day of encounter:  25 min  Venetia Potters, MD     [1]  Allergies Allergen Reactions   Shrimp [Shellfish Allergy] Anaphylaxis    ALL SEAFOOD   Prolia  [Denosumab ] Other (See Comments)    Chest pain   Percocet [Oxycodone -Acetaminophen ]     Confusion   [2]  Social  History Tobacco Use   Smoking status: Former    Current packs/day: 0.00    Types: Cigarettes    Start date: 1955    Quit date:  1975    Years since quitting: 51.0   Smokeless tobacco: Never   Tobacco comments:    Quit in early 57's  Vaping Use   Vaping status: Never Used  Substance Use Topics   Alcohol use: No    Alcohol/week: 0.0 standard drinks of alcohol   Drug use: No   "

## 2024-04-11 ENCOUNTER — Ambulatory Visit: Payer: Medicare Other | Admitting: Neurology

## 2024-04-18 ENCOUNTER — Ambulatory Visit: Admitting: Neurology

## 2024-04-18 ENCOUNTER — Encounter: Payer: Self-pay | Admitting: Neurology

## 2024-04-18 VITALS — BP 137/77 | HR 84 | Ht 60.0 in | Wt 118.0 lb

## 2024-04-18 DIAGNOSIS — G629 Polyneuropathy, unspecified: Secondary | ICD-10-CM

## 2024-04-18 DIAGNOSIS — M1990 Unspecified osteoarthritis, unspecified site: Secondary | ICD-10-CM

## 2024-04-18 DIAGNOSIS — M5416 Radiculopathy, lumbar region: Secondary | ICD-10-CM | POA: Diagnosis not present

## 2024-04-18 NOTE — Patient Instructions (Addendum)
 You look great today. We will make no changes.  Continue gabapentin  300 mg in the morning and 600 mg at night  You can also try Lidocaine  cream as needed. Apply where you have pain, tingling, or burning. Wear gloves to prevent your hands being numb. This can be bought over the counter at any drug store or online.  Alpha lipoic acid 600mg  daily has some research data suggesting it helps with nerve health. No major side effects other than <1% of people report upset stomach. This can be taken twice per day (1200mg  daily) if no relief obtained. You can buy this over the counter or online.  I will see you again in 1 year or sooner if needed.  The physicians and staff at [facility] are committed to providing excellent care. You may receive a survey requesting feedback about your experience at our office. We strive to receive very good responses to the survey questions. If you feel that your experience would prevent you from giving the office a very good  response, please contact our office to try to remedy the situation. We may be reached at [phone]. Thank you for taking the time out of your busy day to complete the survey.  Venetia Potters, MD [facility]  Preventing Falls at [facility] are common, often dreaded events in the lives of older people. Aside from the obvious injuries and even death that may result, fall can cause wide-ranging consequences including loss of independence, mental decline, decreased activity and mobility. Younger people are also at risk of falling, especially those with chronic illnesses and fatigue.  Ways to reduce risk for falling Examine diet and medications. Warm foods and alcohol dilate blood vessels, which can lead to dizziness when standing. Sleep aids, antidepressants and pain medications can also increase the likelihood of a fall.  Get a vision exam. Poor vision, cataracts and glaucoma increase the chances of falling.  Check foot gear. Shoes  should fit snugly and have a sturdy, nonskid sole and a broad, low heel  Participate in a physician-approved exercise program to build and maintain muscle strength and improve balance and coordination. Programs that use ankle weights or stretch bands are excellent for muscle-strengthening. Water aerobics programs and low-impact Tai Chi programs have also been shown to improve balance and coordination.  Increase vitamin D  intake. Vitamin D  improves muscle strength and increases the amount of calcium the body is able to absorb and deposit in bones.  How to prevent falls from common hazards Floors - Remove all loose wires, cords, and throw rugs. Minimize clutter. Make sure rugs are anchored and smooth. Keep furniture in its usual place.  Chairs -- Use chairs with straight backs, armrests and firm seats. Add firm cushions to existing pieces to add height.  Bathroom - Install grab bars and non-skid tape in the tub or shower. Use a bathtub transfer bench or a shower chair with a back support Use an elevated toilet seat and/or safety rails to assist standing from a low surface. Do not use towel racks or bathroom tissue holders to help you stand.  Lighting - Make sure halls, stairways, and entrances are well-lit. Install a night light in your bathroom or hallway. Make sure there is a light switch at the top and bottom of the staircase. Turn lights on if you get up in the middle of the night. Make sure lamps or light switches are within reach of the bed if you have to get up during the night.  Kitchen - Install non-skid rubber mats near the sink and stove. Clean spills immediately. Store frequently used utensils, pots, pans between waist and eye level. This helps prevent reaching and bending. Sit when getting things out of lower cupboards.  Living room/ Bedrooms - Place furniture with wide spaces in between, giving enough room to move around. Establish a route through the living room that gives you something  to hold onto as you walk.  Stairs - Make sure treads, rails, and rugs are secure. Install a rail on both sides of the stairs. If stairs are a threat, it might be helpful to arrange most of your activities on the lower level to reduce the number of times you must climb the stairs.  Entrances and doorways - Install metal handles on the walls adjacent to the doorknobs of all doors to make it more secure as you travel through the doorway.  Tips for maintaining balance Keep at least one hand free at all times. Try using a backpack or fanny pack to hold things rather than carrying them in your hands. Never carry objects in both hands when walking as this interferes with keeping your balance.  Attempt to swing both arms from front to back while walking. This might require a conscious effort if Parkinson's disease has diminished your movement. It will, however, help you to maintain balance and posture, and reduce fatigue.  Consciously lift your feet off of the ground when walking. Shuffling and dragging of the feet is a common culprit in losing your balance.  When trying to navigate turns, use a U technique of facing forward and making a wide turn, rather than pivoting sharply.  Try to stand with your feet shoulder-length apart. When your feet are close together for any length of time, you increase your risk of losing your balance and falling.  Do one thing at a time. Don't try to walk and accomplish another task, such as reading or looking around. The decrease in your automatic reflexes complicates motor function, so the less distraction, the better.  Do not wear rubber or gripping soled shoes, they might catch on the floor and cause tripping.  Move slowly when changing positions. Use deliberate, concentrated movements and, if needed, use a grab bar or walking aid. Count 15 seconds between each movement. For example, when rising from a seated position, wait 15 seconds after standing to begin  walking.  If balance is a continuous problem, you might want to consider a walking aid such as a cane, walking stick, or walker. Once you've mastered walking with help, you might be ready to try it on your own again.

## 2024-04-19 ENCOUNTER — Ambulatory Visit

## 2024-04-19 DIAGNOSIS — R2681 Unsteadiness on feet: Secondary | ICD-10-CM

## 2024-04-19 DIAGNOSIS — R2689 Other abnormalities of gait and mobility: Secondary | ICD-10-CM

## 2024-04-19 DIAGNOSIS — M25512 Pain in left shoulder: Secondary | ICD-10-CM

## 2024-04-19 DIAGNOSIS — M6281 Muscle weakness (generalized): Secondary | ICD-10-CM

## 2024-04-19 DIAGNOSIS — R293 Abnormal posture: Secondary | ICD-10-CM | POA: Diagnosis not present

## 2024-04-19 DIAGNOSIS — M25562 Pain in left knee: Secondary | ICD-10-CM | POA: Diagnosis not present

## 2024-04-19 DIAGNOSIS — G8929 Other chronic pain: Secondary | ICD-10-CM

## 2024-04-19 DIAGNOSIS — M25511 Pain in right shoulder: Secondary | ICD-10-CM | POA: Diagnosis not present

## 2024-04-19 DIAGNOSIS — M25561 Pain in right knee: Secondary | ICD-10-CM

## 2024-04-19 NOTE — Therapy (Signed)
 " OUTPATIENT PHYSICAL THERAPY TREATMENT / RECERTIFICATION    Patient Name: Jody [name] MRN: [id number] DOB:[birth year], 84 y.o., female Today's Date: 04/19/2024  END OF SESSION:  PT End of Session - 04/19/24 1019     Visit Number 7    Number of Visits 9    Date for Recertification  05/03/24    Authorization Type MEDICARE AND [employer] LIFE    Progress Note Due on Visit 10    PT Start Time 1019    PT Stop Time 1058    PT Time Calculation (min) 39 min    Activity Tolerance Patient limited by pain    Behavior During Therapy WFL for tasks assessed/performed            Past Medical History:  Diagnosis Date   Anemia, unspecified    on meds   B12 deficiency due to diet    Basal cell carcinoma    LEFT UPPER ARM SUP. TX=CX3 5FU   Cataract    bilateral sx   Cramp of limb    Edema of both legs    Encounter for long-term (current) use of other medications    Hypertension    on meds   Lumbago    Melanoma (HCC) 10/18/2010   RIGHT POST SHOULDER =MOHS   Obesity    Osteoarthrosis, unspecified whether generalized or localized, unspecified site    generalized   Restless legs syndrome (RLS)    SCC (squamous cell carcinoma) 07/03/2017   RIGHT CHEST CX3 5FU   SCC (squamous cell carcinoma) 01/28/2019   LEFT FOREARM CX3 5FU   SCC (squamous cell carcinoma) [id number]   RIGHT FOREHEAD CX3 5FU   SCC (squamous cell carcinoma) 07/03/2017   RIGHT FOREARM CX3 5FU   SCCA (squamous cell carcinoma) of skin 04/08/2020   Right Anterior Mandible (in situ)(CX35FU)   SCCA (squamous cell carcinoma) of skin 04/08/2020   Left Supraorbital Region (Keratoacanthoma)   Senile osteoporosis    Squamous cell carcinoma of skin 01/30/2017   RIGHT JAWLINE TX WITH BX   Past Surgical History:  Procedure Laterality Date   CARPAL TUNNEL RELEASE Left 03/08/2024   Procedure: RELEASE, CARPAL TUNNEL, ENDOSCOPIC;  Surgeon: Arlinda Buster, MD;  Location: [facility];  Service: Orthopedics;   Laterality: Left;   CATARACT EXTRACTION W/ INTRAOCULAR LENS  IMPLANT, BILATERAL  04/2014   COLONOSCOPY     EYE SURGERY Right 04/28/2016   Macular   LEFT HEART CATH AND CORONARY ANGIOGRAPHY N/A 05/15/2023   Procedure: LEFT HEART CATH AND CORONARY ANGIOGRAPHY;  Surgeon: Ladona Heinz, MD;  Location: [facility];  Service: Cardiovascular;  Laterality: N/A;   SPINE SURGERY  2005   STOMACH SURGERY  1981   stapleing    TONSILLECTOMY     UPPER GASTROINTESTINAL ENDOSCOPY     VITRECTOMY Right    with membrane peeling for a macular pucker   Patient Active Problem List   Diagnosis Date Noted   Left carpal tunnel syndrome 03/08/2024   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   Neuropathy 07/31/2023   Pure hypercholesterolemia 11/14/2019   Gastroesophageal reflux disease 02/04/2019   Lumbar back pain with radiculopathy affecting left lower extremity 04/11/2016   Cervical spondylosis without myelopathy 06/08/2015   Absolute anemia 05/16/2014   Primary osteoarthritis involving multiple joints 05/16/2014   Essential hypertension 09/03/2012   Senile osteoporosis    B12 deficiency due to diet     PCP: Harlene MARLA An, NP  REFERRING PROVIDER:  Lonell Sprang, DO  REFERRING DIAG: M17.0 (ICD-10-CM) - Bilateral primary osteoarthritis of knee M19.011,M19.012 (ICD-10-CM) - Primary osteoarthritis of shoulders, bilateral M12.811,M12.812 (ICD-10-CM) - Rotator cuff arthropathy of both shoulders R29.898 (ICD-10-CM) - Leg weakness, bilateral  THERAPY DIAG:  Chronic pain of both knees  Muscle weakness (generalized)  Chronic pain of both shoulders  Other abnormalities of gait and mobility  Abnormal posture  Unsteadiness on feet  Rationale for Evaluation and Treatment: Rehabilitation  ONSET DATE: 01/28/2024 is when it worsened   SUBJECTIVE:   SUBJECTIVE STATEMENT: Patient reports that she feels like she is overall feeling better.   PERTINENT  HISTORY: Patient reports that she has chronic bilat knee (Lt>Rt) pain and wears a brace on Lt knee. Patient endorses that she fell and fractured Lt patella in 2017 which is why it still hurts. Patient also endorses one other fall in 2018 out of her attic which led to a hospitalization and cervical fusion. Patient endorses bilat shoulder pain (Rt>Lt) and requires assistance for reaching with Rt shoulder flexion. Patient recently finished a prednisone  regimen which has improved overall symptoms/pain back to her normal. Patient recently began feeling aching/trembling in Lt LE, has been experiencing generalized muscle atrophy and weakness, and requires aquatic therapy at least 4x/wk in order to continue to be able to get out of bed. Patient endorses one other orthopedic surgery in lower back with breakdown of adjacent vertebrae above and below. Patient has severe osteoporosis.   See PMH or personal factors for more in depth comorbidities  PAIN:  NPRS scale: <5/10 Pain location: shoulder, knees, LEs in general Pain description: consistent, debilitating Aggravating factors: standing, walking, reaching, functional IR  Relieving factors: aquatic therapy   PRECAUTIONS: Fall and high risk for fractures as she is severely osteoporotic   RED FLAGS: Bowel or bladder incontinence: Yes: referral in for a urologist and Cauda equina syndrome: No   WEIGHT BEARING RESTRICTIONS: No  FALLS:  Has patient fallen in last 6 months? No  LIVING ENVIRONMENT: Lives with: lives with their spouse Lives in: House/apartment Stairs: Yes: External: 1 from carport to inside the house steps; none; steps into the attic with pulldown rail which she goes into often Has following equipment at home: Single point cane, Environmental Consultant - 2 wheeled, Environmental Consultant - 4 wheeled, Marine Scientist  OCCUPATION: retired   PLOF: Independent and uses a Recruitment Consultant  PATIENT GOALS: to get a little stronger, to get out of a chair without making a  spectacle   NEXT MD VISIT: 02/19/2024 with Dr. Sprang   OBJECTIVE:  Note: Objective measures were completed at Evaluation unless otherwise noted.  DIAGNOSTIC FINDINGS:  X-rays demonstrate severe bone-on-bone collapse of the lateral tibiofemoral joint space. There is notable subchondral sclerosis of the lateral femoral condyle and lateral tibial plateau. The knee is fall into a valgus fashion bilaterally. Lateral view of the left knee shows healed fragmentation of the patella likely from prior fracture. No acute fracture or acute bony abnormality otherwise noted.   PATIENT SURVEYS:  PSFS: THE PATIENT SPECIFIC FUNCTIONAL SCALE  Place score of 0-10 (0 = unable to perform activity and 10 = able to perform activity at the same level as before injury or problem)  Activity Date:  02/08/2024 03/11/2024   Walking distance  5 7   2. Getting up from a chair  5 6   3. Using arms (reaching) 3 7   4.  Balance and mobility  5 5   5. Walking with a purpose 3 2  Total Score 4.2 5.4     Total Score = Sum of activity scores/number of activities  Minimally Detectable Change: 3 points (for single activity); 2 points (for average score)  [facility] (nd). The Patient Specific Functional Scale . Retrieved from [email or web address]   COGNITION: Eval:  Overall cognitive status: Within functional limits for tasks assessed     SENSATION: Eval: Light touch: WFL Patient endorses highly sensitive to touch on bilat LE   POSTURE:  Eval: rounded shoulders, forward head, and increased thoracic kyphosis   LOWER EXTREMITY ROM:  Active ROM Right Eval 02/08/2024 Left Eval 02/08/2024  Hip flexion    Hip extension    Hip abduction    Hip adduction    Hip internal rotation    Hip external rotation    Knee flexion (seated) [facility] WFL  Knee extension (seated) WFL ~90% WFL  Ankle dorsiflexion    Ankle plantarflexion    Ankle inversion     Ankle eversion     (Blank rows = not tested)  UE ROM: 02/08/2024, seated AROM  Shoulder flexion: Lt: WFL Rt: 69deg Shoulder abduction: Lt: WFL, but painful around 90deg Rt: 60deg Shoulder functional IR: Lt: T12 Rt: iliac crest   Shoulder functional ER: Lt: T2 Rt: UT  LOWER EXTREMITY MMT: painful with MMT   MMT Right Eval 02/08/2024 Left Eval 02/08/2024 Right 03/11/2024 Left 03/11/2024  Hip flexion (seated) 3+/5 3+/5 4/5 4/5  Hip extension      Hip abduction      Hip adduction      Hip internal rotation      Hip external rotation      Knee flexion (seated) 4-/5 4-/5    Knee extension (seated) 4/5 4-/5    Ankle dorsiflexion       Ankle plantarflexion      Ankle inversion      Ankle eversion       (Blank rows = not tested)   FUNCTIONAL TESTS:  Eval: 5 times sit to stand: 13s from arm chair using bilat UEs after first stand   SPECIAL TESTS: Slump 02/08/2024: positive on Rt side   GAIT: Eval:  Distance walked: not objectively measured  Assistive device utilized: SPC/walking stick Level of assistance: supervision  Comments: antalgic and decreased speed                   TREATMENT         DATE:  04/19/2024 TherAct:  Nustep level 4 for 8 minutes with bilat LE only  Sit<>stands from varying heights (20.5, 19.5, 18) 1x5 at each height; able to complete without UE use for all  Bilat leg press 3x10 with 50#  Slant board gastroc stretch 3x30s   Neuro Re-Ed:  Standing step taps to 4 step 1x25 with no difficulty or UE use  Step over and back with small hurdle    TREATMENT         DATE:  03/19/2024 TherEx: Nustep level 3 for 8.5 minutes with bilat LE only  PT discussed HEP progressions at home and adding seated clamshells to HEP   TherAct: (to assist with transfers, stairs, etc.) Sit<>stands in 18 chair 1x10 with focus on lowering  using forward momentum and elbows on thighs for stand Seated clamshells with green TB 2x15  Step ups with 6 step using unilat  UE support on // bars 1x10 each LE, 1x6 each LE  Patient performing calf raises into toe raises between sets    TREATMENT  DATE:  03/14/2024 TherEx:  Nustep level 3 for 6 minutes  Slant board gastroc stretch 3x30s  Calf raises into toe raises 1x20 with unilat UE use   TherAct:  Fwd step ups with 4 step 2x8 leading with each LE and decreasing UE use on // bars with second trial  One instance of posterior LOB secondary to catching toe on step during step down with PT assist to min A for balance  Ambulation with SPC and change in speed in order to challenge walking with a purpose 3x20' with SBA-CGA  PT discussed confidence, visual cues, and how to decrease chances of falling  Lateral step ups with 4 step 1x8 each side with increased UE use on // bars  PT discussing importance of performing fwd and lateral step ups with patient for strength purposes, but also for if/when she may need to rely on one side vs the other due to pain, etc.   Self-Care: PT discussed POC, how to continue increasing challenges at home, and returning to pool    TREATMENT         DATE:  03/11/2024 Therex: Nustep Lvl 5 for 6.5 mins LE only  Incline gastroc stretch 30 sec x 3 bilaterally   Self Care Advice given on hydration and stretching to help cramping.  Also mentioned possible electrolyte drink (watch sugar content).   TherActivity (to improve transfers, stairs, reaching ) Step up forward 4 inch step with hand assist on bar x 10 bilaterally with slow lowering  Sit to stand to sit 18 inch chair s UE assist with slow sitting 2 x 5  Seated wand AAROM flexion to tolerance. 2 lb 2 x 10 (adjusted hand position for Lt to help more in concentric movement.   Neuro Re-ed Modified tandem stance 30 sec x 2 with SBA Alternating heel/toe lifts x 20 each way with SBA , cues for techniques                                                                                                                                TREATMENT         DATE:  03/05/2024 TherEx: Nustep level 4 for 8 minutes with bilat LE only  Seated shoulder flexion with 2# bar 2x12  Seated shoulder abduction 1x20  Seated ER with scap retraction 1x20  Calf raises 1x20   TherAct:  Sit<>stands with 5# KB 3x5 (mat table at 19.5)  Step ups with 4 step 1x8 leading with each foot; intermittent UE use for stability purposes     PATIENT EDUCATION:  Education details: HEP, POC, aquatic PT, sciatica  Person educated: Patient Education method: Explanation, Demonstration, Tactile cues, Verbal cues, and Handouts Education comprehension: verbalized understanding, returned demonstration, verbal cues required, tactile cues required, and needs further education  HOME EXERCISE PROGRAM: Access Code: [email or web address] URL: [email or web address] Date: 02/08/2024 Prepared by: Susannah Daring  Exercises - Mini Squat with Counter Support  - 1 x  daily - 7 x weekly - 2 sets - 5 reps - Seated Long Arc Quad  - 1 x daily - 7 x weekly - 2 sets - 10 reps - 2seconds hold - Standing Knee Flexion AROM with Chair Support  - 1 x daily - 7 x weekly - 2 sets - 10 reps - 2seconds  hold - AAROM/AROM Shoulder Flexion at Cabinet  - 1 x daily - 7 x weekly - 2 sets - 10 reps  ASSESSMENT:  CLINICAL IMPRESSION: Patient arrived to session noting overall improvement and is able to now stand up from most surfaces without any difficulty. Patient tolerated all activities this date with appropriate muscle fatigue. Patient will continue to benefit from skilled PT.  OBJECTIVE IMPAIRMENTS: Abnormal gait, decreased activity tolerance, decreased balance, decreased coordination, decreased endurance, decreased mobility, difficulty walking, decreased ROM, decreased strength, improper body mechanics, postural dysfunction, and pain.   ACTIVITY LIMITATIONS: carrying, lifting, bending, sitting, standing, squatting, stairs, transfers, continence, dressing, and reach over  head  PARTICIPATION LIMITATIONS: cleaning, community activity, and yard work  PERSONAL FACTORS: Age, Behavior pattern, Past/current experiences, Time since onset of injury/illness/exacerbation, and 3+ comorbidities: anemia, osteoporosis, history of cancer, cataracts, HTN, prior falls are also affecting patient's functional outcome.   REHAB POTENTIAL: Fair lacks compliance, high pain levels, multiple comorbidities   CLINICAL DECISION MAKING: Evolving/moderate complexity  EVALUATION COMPLEXITY: Moderate   GOALS: Goals reviewed with patient? Yes  SHORT TERM GOALS: Target date: 03/07/2024 Patient will show compliance with initial HEP. Baseline: Goal status: GOAL MET, 02/29/2024  2.  Patient will report pain levels no greater than 6/10 in order to show an improvement in overall quality of life. Baseline:  Goal status: goal ongoing, 02/29/2024   LONG TERM GOALS: Target date: 05/03/2024  Patient will be independent with final HEP in order to maintain and progress upon functional gains made within PT. Baseline:  Goal status: on going 03/11/2024  2.  Patient will report pain levels no greater than 5/10 in order to show an improvement in overall quality of life. Baseline:  Goal status: on going 03/11/2024  3.  Patient will increase PSFS to at least 6.2 in order to show a significant improvement in subjective disability. Baseline:  Goal status: on going 03/11/2024  4.  Patient will perform 5xSTS to at least 12s in order to decrease risk of falls.  Baseline:  Goal status: on going 03/11/2024  5.  Patient will improve Rt shoulder flexion and abduction to at least 90deg to improve overall functional mobility.  Baseline:  Goal status: on going 03/11/2024  6.  Patient will increase bilat hip flexion strength to at least 4/5 in order to improve biomechanics with functional mobility. Baseline:  Goal status: on going 03/11/2024   PLAN:  PT FREQUENCY: 1-2x/week  PT DURATION: 10  weeks  PLANNED INTERVENTIONS: 97164- PT Re-evaluation, 97750- Physical Performance Testing, 97110-Therapeutic exercises, 97530- Therapeutic activity, [id number]- Neuromuscular re-education, 97535- Self Care, [id number]- Manual therapy, [id number]- Gait training, [phone]- Orthotic Initial, [phone]- Orthotic/Prosthetic subsequent, [phone]- Canalith repositioning, [phone]- Aquatic Therapy, [phone]- Electrical stimulation (unattended), [phone]- Electrical stimulation (manual), [id number]- Vasopneumatic device, [id number]- Ultrasound, [id number]- Ionotophoresis 4mg /ml Dexamethasone , [id number] (1-2 muscles), [phone] (3+ muscles)- Dry Needling, Patient/Family education, Balance training, Stair training, Taping, Joint mobilization, Spinal mobilization, Vestibular training, DME instructions, Cryotherapy, and Moist heat  PLAN FOR NEXT SESSION:    discharge, update final HEP, balance, Strengthening as able, shoulder strength/mobility    Susannah Daring, PT, DPT 04/19/24 11:22 AM       "

## 2024-04-23 ENCOUNTER — Ambulatory Visit (INDEPENDENT_AMBULATORY_CARE_PROVIDER_SITE_OTHER)

## 2024-04-23 DIAGNOSIS — M6281 Muscle weakness (generalized): Secondary | ICD-10-CM | POA: Diagnosis not present

## 2024-04-23 DIAGNOSIS — R2681 Unsteadiness on feet: Secondary | ICD-10-CM | POA: Diagnosis not present

## 2024-04-23 DIAGNOSIS — R2689 Other abnormalities of gait and mobility: Secondary | ICD-10-CM | POA: Diagnosis not present

## 2024-04-23 DIAGNOSIS — M25562 Pain in left knee: Secondary | ICD-10-CM | POA: Diagnosis not present

## 2024-04-23 DIAGNOSIS — G8929 Other chronic pain: Secondary | ICD-10-CM | POA: Diagnosis not present

## 2024-04-23 DIAGNOSIS — M25512 Pain in left shoulder: Secondary | ICD-10-CM

## 2024-04-23 DIAGNOSIS — M25561 Pain in right knee: Secondary | ICD-10-CM | POA: Diagnosis not present

## 2024-04-23 DIAGNOSIS — M25511 Pain in right shoulder: Secondary | ICD-10-CM | POA: Diagnosis not present

## 2024-04-23 DIAGNOSIS — R293 Abnormal posture: Secondary | ICD-10-CM | POA: Diagnosis not present

## 2024-04-23 NOTE — Therapy (Signed)
 " OUTPATIENT PHYSICAL THERAPY TREATMENT / DISCHARGE   Patient Name: Jody [name] MRN: [id number] DOB:06-Aug-1940, 84 y.o., female Today's Date: 04/23/2024  END OF SESSION:  PT End of Session - 04/23/24 1014     Visit Number 8    Number of Visits 9    Date for Recertification  05/03/24    Authorization Type MEDICARE AND [employer]    Progress Note Due on Visit 10    PT Start Time 1014    PT Stop Time 1058    PT Time Calculation (min) 44 min    Activity Tolerance Patient limited by pain    Behavior During Therapy WFL for tasks assessed/performed          Past Medical History:  Diagnosis Date   Anemia, unspecified    on meds   B12 deficiency due to diet    Basal cell carcinoma    LEFT UPPER ARM SUP. TX=CX3 5FU   Cataract    bilateral sx   Cramp of limb    Edema of both legs    Encounter for long-term (current) use of other medications    Hypertension    on meds   Lumbago    Melanoma (HCC) 10/18/2010   RIGHT POST SHOULDER =MOHS   Obesity    Osteoarthrosis, unspecified whether generalized or localized, unspecified site    generalized   Restless legs syndrome (RLS)    SCC (squamous cell carcinoma) 07/03/2017   RIGHT CHEST CX3 5FU   SCC (squamous cell carcinoma) 01/28/2019   LEFT FOREARM CX3 5FU   SCC (squamous cell carcinoma) [id number]   RIGHT FOREHEAD CX3 5FU   SCC (squamous cell carcinoma) 07/03/2017   RIGHT FOREARM CX3 5FU   SCCA (squamous cell carcinoma) of skin 04/08/2020   Right Anterior Mandible (in situ)(CX35FU)   SCCA (squamous cell carcinoma) of skin 04/08/2020   Left Supraorbital Region (Keratoacanthoma)   Senile osteoporosis    Squamous cell carcinoma of skin 01/30/2017   RIGHT JAWLINE TX WITH BX   Past Surgical History:  Procedure Laterality Date   CARPAL TUNNEL RELEASE Left 03/08/2024   Procedure: RELEASE, CARPAL TUNNEL, ENDOSCOPIC;  Surgeon: Arlinda Buster, MD;  Location: [facility];  Service: Orthopedics;  Laterality:  Left;   CATARACT EXTRACTION W/ INTRAOCULAR LENS  IMPLANT, BILATERAL  04/2014   COLONOSCOPY     EYE SURGERY Right 04/28/2016   Macular   LEFT HEART CATH AND CORONARY ANGIOGRAPHY N/A 05/15/2023   Procedure: LEFT HEART CATH AND CORONARY ANGIOGRAPHY;  Surgeon: Ladona Heinz, MD;  Location: [facility];  Service: Cardiovascular;  Laterality: N/A;   SPINE SURGERY  2005   STOMACH SURGERY  1981   stapleing    TONSILLECTOMY     UPPER GASTROINTESTINAL ENDOSCOPY     VITRECTOMY Right    with membrane peeling for a macular pucker   Patient Active Problem List   Diagnosis Date Noted   Left carpal tunnel syndrome 03/08/2024   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   Neuropathy 07/31/2023   Pure hypercholesterolemia 11/14/2019   Gastroesophageal reflux disease 02/04/2019   Lumbar back pain with radiculopathy affecting left lower extremity 04/11/2016   Cervical spondylosis without myelopathy 06/08/2015   Absolute anemia 05/16/2014   Primary osteoarthritis involving multiple joints 05/16/2014   Essential hypertension 09/03/2012   Senile osteoporosis    B12 deficiency due to diet     PCP: Harlene MARLA An, NP  REFERRING PROVIDER: Lonell Sprang, DO  REFERRING DIAG: M17.0 (ICD-10-CM) - Bilateral primary osteoarthritis of knee M19.011,M19.012 (ICD-10-CM) - Primary osteoarthritis of shoulders, bilateral M12.811,M12.812 (ICD-10-CM) - Rotator cuff arthropathy of both shoulders R29.898 (ICD-10-CM) - Leg weakness, bilateral  THERAPY DIAG:  Chronic pain of both knees  Muscle weakness (generalized)  Chronic pain of both shoulders  Other abnormalities of gait and mobility  Abnormal posture  Unsteadiness on feet  Rationale for Evaluation and Treatment: Rehabilitation  ONSET DATE: 01/28/2024 is when it worsened   SUBJECTIVE:   SUBJECTIVE STATEMENT: Patient reports that she has been physically active with no issues.   PERTINENT HISTORY: Patient reports  that she has chronic bilat knee (Lt>Rt) pain and wears a brace on Lt knee. Patient endorses that she fell and fractured Lt patella in 2017 which is why it still hurts. Patient also endorses one other fall in 2018 out of her attic which led to a hospitalization and cervical fusion. Patient endorses bilat shoulder pain (Rt>Lt) and requires assistance for reaching with Rt shoulder flexion. Patient recently finished a prednisone  regimen which has improved overall symptoms/pain back to her normal. Patient recently began feeling aching/trembling in Lt LE, has been experiencing generalized muscle atrophy and weakness, and requires aquatic therapy at least 4x/wk in order to continue to be able to get out of bed. Patient endorses one other orthopedic surgery in lower back with breakdown of adjacent vertebrae above and below. Patient has severe osteoporosis.   See PMH or personal factors for more in depth comorbidities  PAIN:  NPRS scale: <5/10 on average ; can still get up to an 8/10 on bad days  Pain location: shoulder, knees, LEs in general Pain description: consistent, debilitating Aggravating factors: standing, walking, reaching, functional IR  Relieving factors: aquatic therapy   PRECAUTIONS: Fall and high risk for fractures as she is severely osteoporotic   RED FLAGS: Bowel or bladder incontinence: Yes: referral in for a urologist and Cauda equina syndrome: No   WEIGHT BEARING RESTRICTIONS: No  FALLS:  Has patient fallen in last 6 months? No  LIVING ENVIRONMENT: Lives with: lives with their spouse Lives in: House/apartment Stairs: Yes: External: 1 from carport to inside the house steps; none; steps into the attic with pulldown rail which she goes into often Has following equipment at home: Single point cane, Environmental Consultant - 2 wheeled, Environmental Consultant - 4 wheeled, Marine Scientist  OCCUPATION: retired   PLOF: Independent and uses a Recruitment Consultant  PATIENT GOALS: to get a little stronger, to get out  of a chair without making a spectacle   NEXT MD VISIT: 02/19/2024 with Dr. Burnetta   OBJECTIVE:  Note: Objective measures were completed at Evaluation unless otherwise noted.  DIAGNOSTIC FINDINGS:  X-rays demonstrate severe bone-on-bone collapse of the lateral tibiofemoral joint space. There is notable subchondral sclerosis of the lateral femoral condyle and lateral tibial plateau. The knee is fall into a valgus fashion bilaterally. Lateral view of the left knee shows healed fragmentation of the patella likely from prior fracture. No acute fracture or acute bony abnormality otherwise noted.   PATIENT SURVEYS:  PSFS: THE PATIENT SPECIFIC FUNCTIONAL SCALE  Place score of 0-10 (0 = unable to perform activity and 10 = able to perform activity at the same level as before injury or problem)  Activity Date:  02/08/2024 03/11/2024 04/23/2024  Walking distance  5 7 8   2. Getting up from a chair  5 6 9   3. Using arms (reaching) 3 7 9   4.  Balance and mobility  5  5 8  5. Walking with a purpose 3 2 8   Total Score 4.2 5.4 8.4    Total Score = Sum of activity scores/number of activities  Minimally Detectable Change: 3 points (for single activity); 2 points (for average score)  [facility] (nd). The Patient Specific Functional Scale . Retrieved from [email or web address]   COGNITION: Eval:  Overall cognitive status: Within functional limits for tasks assessed     SENSATION: Eval: Light touch: WFL Patient endorses highly sensitive to touch on bilat LE   POSTURE:  Eval: rounded shoulders, forward head, and increased thoracic kyphosis   LOWER EXTREMITY ROM:  Active ROM Right Eval 02/08/2024 Left Eval 02/08/2024  Hip flexion    Hip extension    Hip abduction    Hip adduction    Hip internal rotation    Hip external rotation    Knee flexion (seated) [facility] WFL  Knee extension (seated) WFL ~90% WFL  Ankle dorsiflexion     Ankle plantarflexion    Ankle inversion    Ankle eversion     (Blank rows = not tested)  UE ROM: 02/08/2024, seated AROM  Shoulder flexion: Lt: WFL Rt: 69deg  Shoulder abduction: Lt: WFL, but painful around 90deg Rt: 60deg Shoulder functional IR: Lt: T12 Rt: iliac crest   Shoulder functional ER: Lt: T2 Rt: UT  UE ROM: seated 04/23/2024 Shoulder flexion: 109deg  Shoulder abduction: 86deg   LOWER EXTREMITY MMT: painful with MMT   MMT Right Eval 02/08/2024 Left Eval 02/08/2024 Right 03/11/2024 Left 03/11/2024  Hip flexion (seated) 3+/5 3+/5 4/5 4/5  Hip extension      Hip abduction      Hip adduction      Hip internal rotation      Hip external rotation      Knee flexion (seated) 4-/5 4-/5    Knee extension (seated) 4/5 4-/5    Ankle dorsiflexion       Ankle plantarflexion      Ankle inversion      Ankle eversion       (Blank rows = not tested)   FUNCTIONAL TESTS:  Eval: 5 times sit to stand: 13s from arm chair using bilat UEs after first stand   04/23/2024 18s no UE use on arm chair   SPECIAL TESTS: Slump 02/08/2024: positive on Rt side   GAIT: Eval:  Distance walked: not objectively measured  Assistive device utilized: SPC/walking stick Level of assistance: supervision  Comments: antalgic and decreased speed                   TREATMENT         DATE:  04/23/2024 TherEx:  ROM, MMT measurements taken with results discussed with patient and noted above  Final HEP handout provided and discussed with patient  Patient performed few reps with each activity in order to discuss progressions and regressions   Self-Care: PT discussed POC, return to PT, and return to independent physical activity and importance of continuing to improve    TREATMENT         DATE:  04/19/2024 TherAct:  Nustep level 4 for 8 minutes with bilat LE only  Sit<>stands from varying heights (20.5, 19.5, 18) 1x5 at each height; able to complete without UE use for all  Bilat leg press  3x10 with 50#  Slant board gastroc stretch 3x30s   Neuro Re-Ed:  Standing step taps to 4 step 1x25 with no difficulty or UE use  Step over and  back with small hurdle    TREATMENT         DATE:  03/19/2024 TherEx: Nustep level 3 for 8.5 minutes with bilat LE only  PT discussed HEP progressions at home and adding seated clamshells to HEP   TherAct: (to assist with transfers, stairs, etc.) Sit<>stands in 18 chair 1x10 with focus on lowering  using forward momentum and elbows on thighs for stand Seated clamshells with green TB 2x15  Step ups with 6 step using unilat UE support on // bars 1x10 each LE, 1x6 each LE  Patient performing calf raises into toe raises between sets    TREATMENT         DATE:  03/14/2024 TherEx:  Nustep level 3 for 6 minutes  Slant board gastroc stretch 3x30s  Calf raises into toe raises 1x20 with unilat UE use   TherAct:  Fwd step ups with 4 step 2x8 leading with each LE and decreasing UE use on // bars with second trial  One instance of posterior LOB secondary to catching toe on step during step down with PT assist to min A for balance  Ambulation with SPC and change in speed in order to challenge walking with a purpose 3x20' with SBA-CGA  PT discussed confidence, visual cues, and how to decrease chances of falling  Lateral step ups with 4 step 1x8 each side with increased UE use on // bars  PT discussing importance of performing fwd and lateral step ups with patient for strength purposes, but also for if/when she may need to rely on one side vs the other due to pain, etc.   Self-Care: PT discussed POC, how to continue increasing challenges at home, and returning to pool    TREATMENT         DATE:  03/11/2024 Therex: Nustep Lvl 5 for 6.5 mins LE only  Incline gastroc stretch 30 sec x 3 bilaterally   Self Care Advice given on hydration and stretching to help cramping.  Also mentioned possible electrolyte drink (watch sugar content).    TherActivity (to improve transfers, stairs, reaching ) Step up forward 4 inch step with hand assist on bar x 10 bilaterally with slow lowering  Sit to stand to sit 18 inch chair s UE assist with slow sitting 2 x 5  Seated wand AAROM flexion to tolerance. 2 lb 2 x 10 (adjusted hand position for Lt to help more in concentric movement.   Neuro Re-ed Modified tandem stance 30 sec x 2 with SBA Alternating heel/toe lifts x 20 each way with SBA , cues for techniques                                                                                                                               TREATMENT         DATE:  03/05/2024 TherEx: Nustep level 4 for 8 minutes with bilat LE only  Seated shoulder flexion with 2# bar  2x12  Seated shoulder abduction 1x20  Seated ER with scap retraction 1x20  Calf raises 1x20   TherAct:  Sit<>stands with 5# KB 3x5 (mat table at 19.5)  Step ups with 4 step 1x8 leading with each foot; intermittent UE use for stability purposes     PATIENT EDUCATION:  Education details: HEP, POC, aquatic PT, sciatica  Person educated: Patient Education method: Explanation, Demonstration, Tactile cues, Verbal cues, and Handouts Education comprehension: verbalized understanding, returned demonstration, verbal cues required, tactile cues required, and needs further education  HOME EXERCISE PROGRAM: Access Code: [email or web address] URL: [email or web address] Date: 04/23/2024 Prepared by: Susannah Daring  Exercises - Seated Long Arc Quad  - 1 x daily - 7 x weekly - 2 sets - 10 reps - 2seconds hold - Standing Knee Flexion AROM with Chair Support  - 1 x daily - 7 x weekly - 2 sets - 10 reps - 2seconds  hold - AAROM/AROM Shoulder Flexion at Cabinet  - 1 x daily - 7 x weekly - 2 sets - 10 reps - AAROM/AROM Shoulder Scaption at Cabinet  - 1 x daily - 7 x weekly - 2 sets - 10 reps - Seated Hip Abduction with Resistance  - 1 x daily - 7 x weekly - 2 sets - 10 reps - Heel Raises  with Counter Support  - 1 x daily - 7 x weekly - 2 sets - 10 reps - Heel Toe Raises with Counter Support  - 1 x daily - 7 x weekly - 2 sets - 10 reps - Gastroc Stretch on Wall  - 1 x daily - 7 x weekly - 2 sets - 30sec hold - Sit to Stand with Arms Crossed  - 1 x daily - 7 x weekly - 2 sets - 10 reps  ASSESSMENT:  CLINICAL IMPRESSION: Patient arrived to session noting being happy with overall improvements in pain levels and functional mobility/independence. Patient has shown improvements in strength, ROM, and functional mobility and has reached almost all LTGs. Patient is an appropriate and formal candidate for discharge this date.  OBJECTIVE IMPAIRMENTS: Abnormal gait, decreased activity tolerance, decreased balance, decreased coordination, decreased endurance, decreased mobility, difficulty walking, decreased ROM, decreased strength, improper body mechanics, postural dysfunction, and pain.   ACTIVITY LIMITATIONS: carrying, lifting, bending, sitting, standing, squatting, stairs, transfers, continence, dressing, and reach over head  PARTICIPATION LIMITATIONS: cleaning, community activity, and yard work  PERSONAL FACTORS: Age, Behavior pattern, Past/current experiences, Time since onset of injury/illness/exacerbation, and 3+ comorbidities: anemia, osteoporosis, history of cancer, cataracts, HTN, prior falls are also affecting patient's functional outcome.   REHAB POTENTIAL: Fair lacks compliance, high pain levels, multiple comorbidities   CLINICAL DECISION MAKING: Evolving/moderate complexity  EVALUATION COMPLEXITY: Moderate   GOALS: Goals reviewed with patient? Yes  SHORT TERM GOALS: Target date: 03/07/2024 Patient will show compliance with initial HEP. Baseline: Goal status: GOAL MET, 02/29/2024  2.  Patient will report pain levels no greater than 6/10 in order to show an improvement in overall quality of life. Baseline:  Goal status: goal not met, 04/23/2024   LONG TERM GOALS:  Target date: 05/03/2024  Patient will be independent with final HEP in order to maintain and progress upon functional gains made within PT. Baseline:  Goal status: goal met, 04/23/2024  2.  Patient will report pain levels no greater than 5/10 in order to show an improvement in overall quality of life. Baseline:  Goal status: goal not met, 04/23/2024  3.  Patient will increase PSFS to at  least 6.2 in order to show a significant improvement in subjective disability. Baseline:  Goal status: ogoal met, 04/23/2024  4.  Patient will perform 5xSTS to at least 12s in order to decrease risk of falls.  Baseline:  Goal status: goal not met, 04/23/2024  5.  Patient will improve Rt shoulder flexion and abduction to at least 90deg to improve overall functional mobility.  Baseline:  Goal status: goal met, 04/23/2024  6.  Patient will increase bilat hip flexion strength to at least 4/5 in order to improve biomechanics with functional mobility. Baseline:  Goal status: goal met, 04/23/2024   PLAN:  PT FREQUENCY: 1-2x/week  PT DURATION: 10 weeks  PLANNED INTERVENTIONS: 97164- PT Re-evaluation, 97750- Physical Performance Testing, 97110-Therapeutic exercises, 97530- Therapeutic activity, [id number]- Neuromuscular re-education, 97535- Self Care, [id number]- Manual therapy, [id number]- Gait training, [phone]- Orthotic Initial, [phone]- Orthotic/Prosthetic subsequent, [phone]- Canalith repositioning, [phone]- Aquatic Therapy, [phone]- Electrical stimulation (unattended), [phone]- Electrical stimulation (manual), [id number]- Vasopneumatic device, [id number]- Ultrasound, [id number]- Ionotophoresis 4mg /ml Dexamethasone , [id number] (1-2 muscles), [id number] (3+ muscles)- Dry Needling, Patient/Family education, Balance training, Stair training, Taping, Joint mobilization, Spinal mobilization, Vestibular training, DME instructions, Cryotherapy, and Moist heat  PLAN FOR NEXT SESSION:    PHYSICAL THERAPY DISCHARGE SUMMARY  Visits from Start of Care: 8  Current  functional level related to goals / functional outcomes: See above    Remaining deficits: See above    Education / Equipment: HEP   Patient agrees to discharge. Patient goals were partially met. Patient is being discharged due to being pleased with the current functional level.   Susannah Daring, PT, DPT 04/23/24 11:10 AM       "

## 2024-04-24 ENCOUNTER — Encounter: Payer: Self-pay | Admitting: Sports Medicine

## 2024-05-02 ENCOUNTER — Encounter: Payer: Self-pay | Admitting: Sports Medicine

## 2024-05-03 ENCOUNTER — Encounter: Payer: Self-pay | Admitting: Sports Medicine

## 2024-05-08 ENCOUNTER — Ambulatory Visit: Admitting: Sports Medicine

## 2024-05-16 ENCOUNTER — Ambulatory Visit: Admitting: Neurology

## 2024-08-02 ENCOUNTER — Ambulatory Visit: Admitting: Nurse Practitioner

## 2025-04-18 ENCOUNTER — Ambulatory Visit: Payer: Self-pay | Admitting: Neurology
